# Patient Record
Sex: Female | Born: 1945 | Race: White | Hispanic: No | Smoking: Never smoker
Health system: Southern US, Community
[De-identification: ages and names within clinical notes are randomized; demographics above are authoritative.]

## PROBLEM LIST (undated history)

## (undated) DIAGNOSIS — E7800 Pure hypercholesterolemia, unspecified: Secondary | ICD-10-CM

## (undated) DIAGNOSIS — M5441 Lumbago with sciatica, right side: Secondary | ICD-10-CM

## (undated) DIAGNOSIS — J309 Allergic rhinitis, unspecified: Secondary | ICD-10-CM

## (undated) DIAGNOSIS — K219 Gastro-esophageal reflux disease without esophagitis: Secondary | ICD-10-CM

## (undated) DIAGNOSIS — Z8489 Family history of other specified conditions: Secondary | ICD-10-CM

## (undated) DIAGNOSIS — M1990 Unspecified osteoarthritis, unspecified site: Secondary | ICD-10-CM

## (undated) DIAGNOSIS — Z87442 Personal history of urinary calculi: Secondary | ICD-10-CM

## (undated) DIAGNOSIS — D649 Anemia, unspecified: Secondary | ICD-10-CM

## (undated) DIAGNOSIS — F329 Major depressive disorder, single episode, unspecified: Secondary | ICD-10-CM

## (undated) DIAGNOSIS — I2699 Other pulmonary embolism without acute cor pulmonale: Secondary | ICD-10-CM

## (undated) DIAGNOSIS — E559 Vitamin D deficiency, unspecified: Secondary | ICD-10-CM

## (undated) DIAGNOSIS — N183 Chronic kidney disease, stage 3 unspecified: Secondary | ICD-10-CM

## (undated) DIAGNOSIS — E119 Type 2 diabetes mellitus without complications: Secondary | ICD-10-CM

## (undated) DIAGNOSIS — I10 Essential (primary) hypertension: Secondary | ICD-10-CM

## (undated) DIAGNOSIS — N281 Cyst of kidney, acquired: Secondary | ICD-10-CM

## (undated) DIAGNOSIS — I509 Heart failure, unspecified: Secondary | ICD-10-CM

## (undated) DIAGNOSIS — H409 Unspecified glaucoma: Secondary | ICD-10-CM

## (undated) DIAGNOSIS — F32A Depression, unspecified: Secondary | ICD-10-CM

## (undated) HISTORY — DX: Other pulmonary embolism without acute cor pulmonale: I26.99

## (undated) HISTORY — DX: Pure hypercholesterolemia, unspecified: E78.00

## (undated) HISTORY — DX: Lumbago with sciatica, right side: M54.41

## (undated) HISTORY — PX: CHOLECYSTECTOMY OPEN: SUR202

## (undated) HISTORY — PX: COLONOSCOPY: SHX174

## (undated) HISTORY — DX: Chronic kidney disease, stage 3 unspecified: N18.30

## (undated) HISTORY — DX: Unspecified glaucoma: H40.9

## (undated) HISTORY — DX: Cyst of kidney, acquired: N28.1

## (undated) HISTORY — DX: Allergic rhinitis, unspecified: J30.9

## (undated) HISTORY — DX: Vitamin D deficiency, unspecified: E55.9

## (undated) HISTORY — PX: BREAST BIOPSY: SHX20

## (undated) HISTORY — DX: Gastro-esophageal reflux disease without esophagitis: K21.9

## (undated) MED FILL — Magnesium Sulfate IV Soln 4 GM/100ML (40 MG/ML): INTRAVENOUS | Qty: 100 | Status: AC

---

## 1999-08-09 ENCOUNTER — Encounter: Payer: Self-pay | Admitting: Family Medicine

## 1999-08-09 ENCOUNTER — Encounter: Admission: RE | Admit: 1999-08-09 | Discharge: 1999-08-09 | Payer: Self-pay | Admitting: Family Medicine

## 1999-10-11 ENCOUNTER — Encounter: Payer: Self-pay | Admitting: Family Medicine

## 1999-10-11 ENCOUNTER — Encounter: Admission: RE | Admit: 1999-10-11 | Discharge: 1999-10-11 | Payer: Self-pay | Admitting: Family Medicine

## 1999-11-27 ENCOUNTER — Ambulatory Visit (HOSPITAL_COMMUNITY): Admission: RE | Admit: 1999-11-27 | Discharge: 1999-11-27 | Payer: Self-pay | Admitting: Gastroenterology

## 1999-11-27 ENCOUNTER — Encounter (INDEPENDENT_AMBULATORY_CARE_PROVIDER_SITE_OTHER): Payer: Self-pay | Admitting: *Deleted

## 2000-09-18 ENCOUNTER — Encounter: Payer: Self-pay | Admitting: Family Medicine

## 2000-09-18 ENCOUNTER — Encounter: Admission: RE | Admit: 2000-09-18 | Discharge: 2000-09-18 | Payer: Self-pay | Admitting: Family Medicine

## 2001-09-19 ENCOUNTER — Encounter: Payer: Self-pay | Admitting: Family Medicine

## 2001-09-19 ENCOUNTER — Encounter: Admission: RE | Admit: 2001-09-19 | Discharge: 2001-09-19 | Payer: Self-pay | Admitting: Family Medicine

## 2002-12-09 ENCOUNTER — Encounter: Admission: RE | Admit: 2002-12-09 | Discharge: 2002-12-09 | Payer: Self-pay | Admitting: Family Medicine

## 2002-12-09 ENCOUNTER — Encounter: Payer: Self-pay | Admitting: Family Medicine

## 2003-12-22 ENCOUNTER — Encounter: Admission: RE | Admit: 2003-12-22 | Discharge: 2003-12-22 | Payer: Self-pay | Admitting: Family Medicine

## 2004-04-03 ENCOUNTER — Other Ambulatory Visit: Admission: RE | Admit: 2004-04-03 | Discharge: 2004-04-03 | Payer: Self-pay | Admitting: Family Medicine

## 2005-04-02 ENCOUNTER — Encounter: Admission: RE | Admit: 2005-04-02 | Discharge: 2005-04-02 | Payer: Self-pay | Admitting: Family Medicine

## 2005-04-05 ENCOUNTER — Other Ambulatory Visit: Admission: RE | Admit: 2005-04-05 | Discharge: 2005-04-05 | Payer: Self-pay | Admitting: Family Medicine

## 2006-04-03 ENCOUNTER — Encounter: Admission: RE | Admit: 2006-04-03 | Discharge: 2006-04-03 | Payer: Self-pay | Admitting: Family Medicine

## 2006-04-05 ENCOUNTER — Other Ambulatory Visit: Admission: RE | Admit: 2006-04-05 | Discharge: 2006-04-05 | Payer: Self-pay | Admitting: Family Medicine

## 2007-04-07 ENCOUNTER — Encounter: Admission: RE | Admit: 2007-04-07 | Discharge: 2007-04-07 | Payer: Self-pay | Admitting: Family Medicine

## 2007-04-09 ENCOUNTER — Other Ambulatory Visit: Admission: RE | Admit: 2007-04-09 | Discharge: 2007-04-09 | Payer: Self-pay | Admitting: Family Medicine

## 2008-04-07 ENCOUNTER — Encounter: Admission: RE | Admit: 2008-04-07 | Discharge: 2008-04-07 | Payer: Self-pay | Admitting: Family Medicine

## 2008-04-09 ENCOUNTER — Other Ambulatory Visit: Admission: RE | Admit: 2008-04-09 | Discharge: 2008-04-09 | Payer: Self-pay | Admitting: Family Medicine

## 2009-04-08 ENCOUNTER — Encounter: Admission: RE | Admit: 2009-04-08 | Discharge: 2009-04-08 | Payer: Self-pay | Admitting: Family Medicine

## 2009-05-27 ENCOUNTER — Other Ambulatory Visit: Admission: RE | Admit: 2009-05-27 | Discharge: 2009-05-27 | Payer: Self-pay | Admitting: Family Medicine

## 2010-04-10 ENCOUNTER — Encounter: Admission: RE | Admit: 2010-04-10 | Discharge: 2010-04-10 | Payer: Self-pay | Admitting: Family Medicine

## 2010-05-29 ENCOUNTER — Other Ambulatory Visit
Admission: RE | Admit: 2010-05-29 | Discharge: 2010-05-29 | Payer: Self-pay | Source: Home / Self Care | Admitting: Family Medicine

## 2010-10-27 NOTE — Procedures (Signed)
[place]  Patient:    Michaela Santana, Michaela Santana                         MRN: [id number] Proc. Date: 11/27/99 Adm. Date:  [id number] Disc. Date: [id number] Attending:  Orland Mustard CC:         Duncan Dull, M.D.                           Procedure Report  PROCEDURE:  Colonoscopy and polypectomy.  MEDICATIONS:  Fentanyl 100 mcg, Versed 10 mg IV.  INDICATION:  The patient had a barium enema that suggested a polyp in the rectum.  She has also had heme-positive stools.  There is a family history of breast cancer.  For these reasons, colonoscopy is performed.  DESCRIPTION OF PROCEDURE:  The procedure was explained to the patient and consent obtained.  With the patient in the left lateral decubitus position, the Olympus adult video colonoscope inserted and advanced under direct visualization.  The prep was excellent, and we reached the cecum without difficulty.  Just above the ileocecal valve on the posterior wall was a 0.5 cm that was removed with the snare.  We covered it with the tripod retriever.  No other polyps were seen in the ascending colon, hepatic flexure, transverse colon, splenic flexure, descending, and sigmoid colon.  In the rectum, one area that could have been a polyp was 2-4 mm in size and was probably a sessile polyp.  It was cauterized with the hot biopsy forceps, placed in a separate jar.  No other lesions were seen.  The scope withdrawn.  The patient tolerated the procedure well.  She was maintained on low-flow oxygen and pulse oximetry throughout the procedure with no obvious problems.  ASSESSMENT:  Polyps removed from the rectum and ascending colon.  PLAN:  Will check pathology and depending on the results, will make further recommendations. DD:  11/27/99 TD:  11/30/99 Job: [id number]

## 2011-03-19 ENCOUNTER — Other Ambulatory Visit: Payer: Self-pay | Admitting: Family Medicine

## 2011-03-19 DIAGNOSIS — Z1231 Encounter for screening mammogram for malignant neoplasm of breast: Secondary | ICD-10-CM

## 2011-04-17 ENCOUNTER — Ambulatory Visit
Admission: RE | Admit: 2011-04-17 | Discharge: 2011-04-17 | Disposition: A | Discharge status: Home / Self Care | Payer: Managed Care, Other (non HMO) | Source: Ambulatory Visit | Attending: Family Medicine | Admitting: Family Medicine

## 2011-04-17 DIAGNOSIS — Z1231 Encounter for screening mammogram for malignant neoplasm of breast: Secondary | ICD-10-CM

## 2012-01-16 DIAGNOSIS — M25579 Pain in unspecified ankle and joints of unspecified foot: Secondary | ICD-10-CM | POA: Diagnosis not present

## 2012-01-16 DIAGNOSIS — M659 Synovitis and tenosynovitis, unspecified: Secondary | ICD-10-CM | POA: Diagnosis not present

## 2012-01-16 DIAGNOSIS — M19079 Primary osteoarthritis, unspecified ankle and foot: Secondary | ICD-10-CM | POA: Diagnosis not present

## 2012-01-28 DIAGNOSIS — H40019 Open angle with borderline findings, low risk, unspecified eye: Secondary | ICD-10-CM | POA: Diagnosis not present

## 2012-01-28 DIAGNOSIS — E119 Type 2 diabetes mellitus without complications: Secondary | ICD-10-CM | POA: Diagnosis not present

## 2012-03-21 DIAGNOSIS — Z23 Encounter for immunization: Secondary | ICD-10-CM | POA: Diagnosis not present

## 2012-03-31 ENCOUNTER — Other Ambulatory Visit: Payer: Self-pay | Admitting: Family Medicine

## 2012-03-31 DIAGNOSIS — Z1231 Encounter for screening mammogram for malignant neoplasm of breast: Secondary | ICD-10-CM

## 2012-05-01 ENCOUNTER — Ambulatory Visit
Admission: RE | Admit: 2012-05-01 | Discharge: 2012-05-01 | Disposition: A | Discharge status: Home / Self Care | Payer: Medicare Other | Source: Ambulatory Visit | Attending: Family Medicine | Admitting: Family Medicine

## 2012-05-01 DIAGNOSIS — Z1231 Encounter for screening mammogram for malignant neoplasm of breast: Secondary | ICD-10-CM

## 2012-05-20 DIAGNOSIS — R059 Cough, unspecified: Secondary | ICD-10-CM | POA: Diagnosis not present

## 2012-05-20 DIAGNOSIS — R05 Cough: Secondary | ICD-10-CM | POA: Diagnosis not present

## 2012-05-20 DIAGNOSIS — S20219A Contusion of unspecified front wall of thorax, initial encounter: Secondary | ICD-10-CM | POA: Diagnosis not present

## 2012-05-27 DIAGNOSIS — B029 Zoster without complications: Secondary | ICD-10-CM | POA: Diagnosis not present

## 2012-06-16 DIAGNOSIS — F329 Major depressive disorder, single episode, unspecified: Secondary | ICD-10-CM | POA: Diagnosis not present

## 2012-06-16 DIAGNOSIS — M79609 Pain in unspecified limb: Secondary | ICD-10-CM | POA: Diagnosis not present

## 2012-06-16 DIAGNOSIS — E559 Vitamin D deficiency, unspecified: Secondary | ICD-10-CM | POA: Diagnosis not present

## 2012-06-16 DIAGNOSIS — E119 Type 2 diabetes mellitus without complications: Secondary | ICD-10-CM | POA: Diagnosis not present

## 2012-06-16 DIAGNOSIS — Z0000 Encounter for general adult medical examination without abnormal findings: Secondary | ICD-10-CM | POA: Diagnosis not present

## 2012-06-16 DIAGNOSIS — I10 Essential (primary) hypertension: Secondary | ICD-10-CM | POA: Diagnosis not present

## 2012-06-16 DIAGNOSIS — E7800 Pure hypercholesterolemia, unspecified: Secondary | ICD-10-CM | POA: Diagnosis not present

## 2012-06-16 DIAGNOSIS — Z23 Encounter for immunization: Secondary | ICD-10-CM | POA: Diagnosis not present

## 2012-07-16 DIAGNOSIS — E876 Hypokalemia: Secondary | ICD-10-CM | POA: Diagnosis not present

## 2012-12-15 DIAGNOSIS — E119 Type 2 diabetes mellitus without complications: Secondary | ICD-10-CM | POA: Diagnosis not present

## 2012-12-15 DIAGNOSIS — E7800 Pure hypercholesterolemia, unspecified: Secondary | ICD-10-CM | POA: Diagnosis not present

## 2012-12-15 DIAGNOSIS — I10 Essential (primary) hypertension: Secondary | ICD-10-CM | POA: Diagnosis not present

## 2012-12-15 DIAGNOSIS — F329 Major depressive disorder, single episode, unspecified: Secondary | ICD-10-CM | POA: Diagnosis not present

## 2012-12-15 DIAGNOSIS — K219 Gastro-esophageal reflux disease without esophagitis: Secondary | ICD-10-CM | POA: Diagnosis not present

## 2012-12-15 DIAGNOSIS — E559 Vitamin D deficiency, unspecified: Secondary | ICD-10-CM | POA: Diagnosis not present

## 2013-01-12 DIAGNOSIS — H2510 Age-related nuclear cataract, unspecified eye: Secondary | ICD-10-CM | POA: Diagnosis not present

## 2013-01-12 DIAGNOSIS — H5210 Myopia, unspecified eye: Secondary | ICD-10-CM | POA: Diagnosis not present

## 2013-01-12 DIAGNOSIS — E119 Type 2 diabetes mellitus without complications: Secondary | ICD-10-CM | POA: Diagnosis not present

## 2013-03-31 ENCOUNTER — Other Ambulatory Visit: Payer: Self-pay

## 2013-03-31 DIAGNOSIS — Z1231 Encounter for screening mammogram for malignant neoplasm of breast: Secondary | ICD-10-CM

## 2013-05-04 ENCOUNTER — Ambulatory Visit
Admission: RE | Admit: 2013-05-04 | Discharge: 2013-05-04 | Disposition: A | Discharge status: Home / Self Care | Payer: Medicare Other | Source: Ambulatory Visit

## 2013-05-04 DIAGNOSIS — Z1231 Encounter for screening mammogram for malignant neoplasm of breast: Secondary | ICD-10-CM

## 2013-06-22 DIAGNOSIS — E7800 Pure hypercholesterolemia, unspecified: Secondary | ICD-10-CM | POA: Diagnosis not present

## 2013-06-22 DIAGNOSIS — E559 Vitamin D deficiency, unspecified: Secondary | ICD-10-CM | POA: Diagnosis not present

## 2013-06-22 DIAGNOSIS — M949 Disorder of cartilage, unspecified: Secondary | ICD-10-CM | POA: Diagnosis not present

## 2013-06-22 DIAGNOSIS — I10 Essential (primary) hypertension: Secondary | ICD-10-CM | POA: Diagnosis not present

## 2013-06-22 DIAGNOSIS — E119 Type 2 diabetes mellitus without complications: Secondary | ICD-10-CM | POA: Diagnosis not present

## 2013-06-22 DIAGNOSIS — Z0000 Encounter for general adult medical examination without abnormal findings: Secondary | ICD-10-CM | POA: Diagnosis not present

## 2013-06-22 DIAGNOSIS — F3289 Other specified depressive episodes: Secondary | ICD-10-CM | POA: Diagnosis not present

## 2013-06-22 DIAGNOSIS — M899 Disorder of bone, unspecified: Secondary | ICD-10-CM | POA: Diagnosis not present

## 2013-06-22 DIAGNOSIS — F329 Major depressive disorder, single episode, unspecified: Secondary | ICD-10-CM | POA: Diagnosis not present

## 2013-07-23 DIAGNOSIS — M899 Disorder of bone, unspecified: Secondary | ICD-10-CM | POA: Diagnosis not present

## 2013-07-23 DIAGNOSIS — M949 Disorder of cartilage, unspecified: Secondary | ICD-10-CM | POA: Diagnosis not present

## 2013-08-26 DIAGNOSIS — N390 Urinary tract infection, site not specified: Secondary | ICD-10-CM | POA: Diagnosis not present

## 2013-12-23 DIAGNOSIS — M79609 Pain in unspecified limb: Secondary | ICD-10-CM | POA: Diagnosis not present

## 2013-12-24 ENCOUNTER — Ambulatory Visit
Admission: RE | Admit: 2013-12-24 | Discharge: 2013-12-24 | Disposition: A | Discharge status: Home / Self Care | Payer: Medicare Other | Source: Ambulatory Visit | Attending: Family Medicine | Admitting: Family Medicine

## 2013-12-24 ENCOUNTER — Other Ambulatory Visit: Payer: Self-pay | Admitting: Family Medicine

## 2013-12-24 ENCOUNTER — Encounter (INDEPENDENT_AMBULATORY_CARE_PROVIDER_SITE_OTHER): Payer: Self-pay

## 2013-12-24 DIAGNOSIS — R52 Pain, unspecified: Secondary | ICD-10-CM

## 2013-12-24 DIAGNOSIS — M79609 Pain in unspecified limb: Secondary | ICD-10-CM | POA: Diagnosis not present

## 2013-12-24 DIAGNOSIS — M19079 Primary osteoarthritis, unspecified ankle and foot: Secondary | ICD-10-CM | POA: Diagnosis not present

## 2013-12-24 IMAGING — CR DG ANKLE COMPLETE 3+V*L*
3 series · 3 of 3 positions shown · non-contrast
Comparison: None.

CLINICAL DATA: Pain and swelling, no trauma

EXAM:
LEFT ANKLE COMPLETE - 3+ VIEW

[t ankle joint lat left]
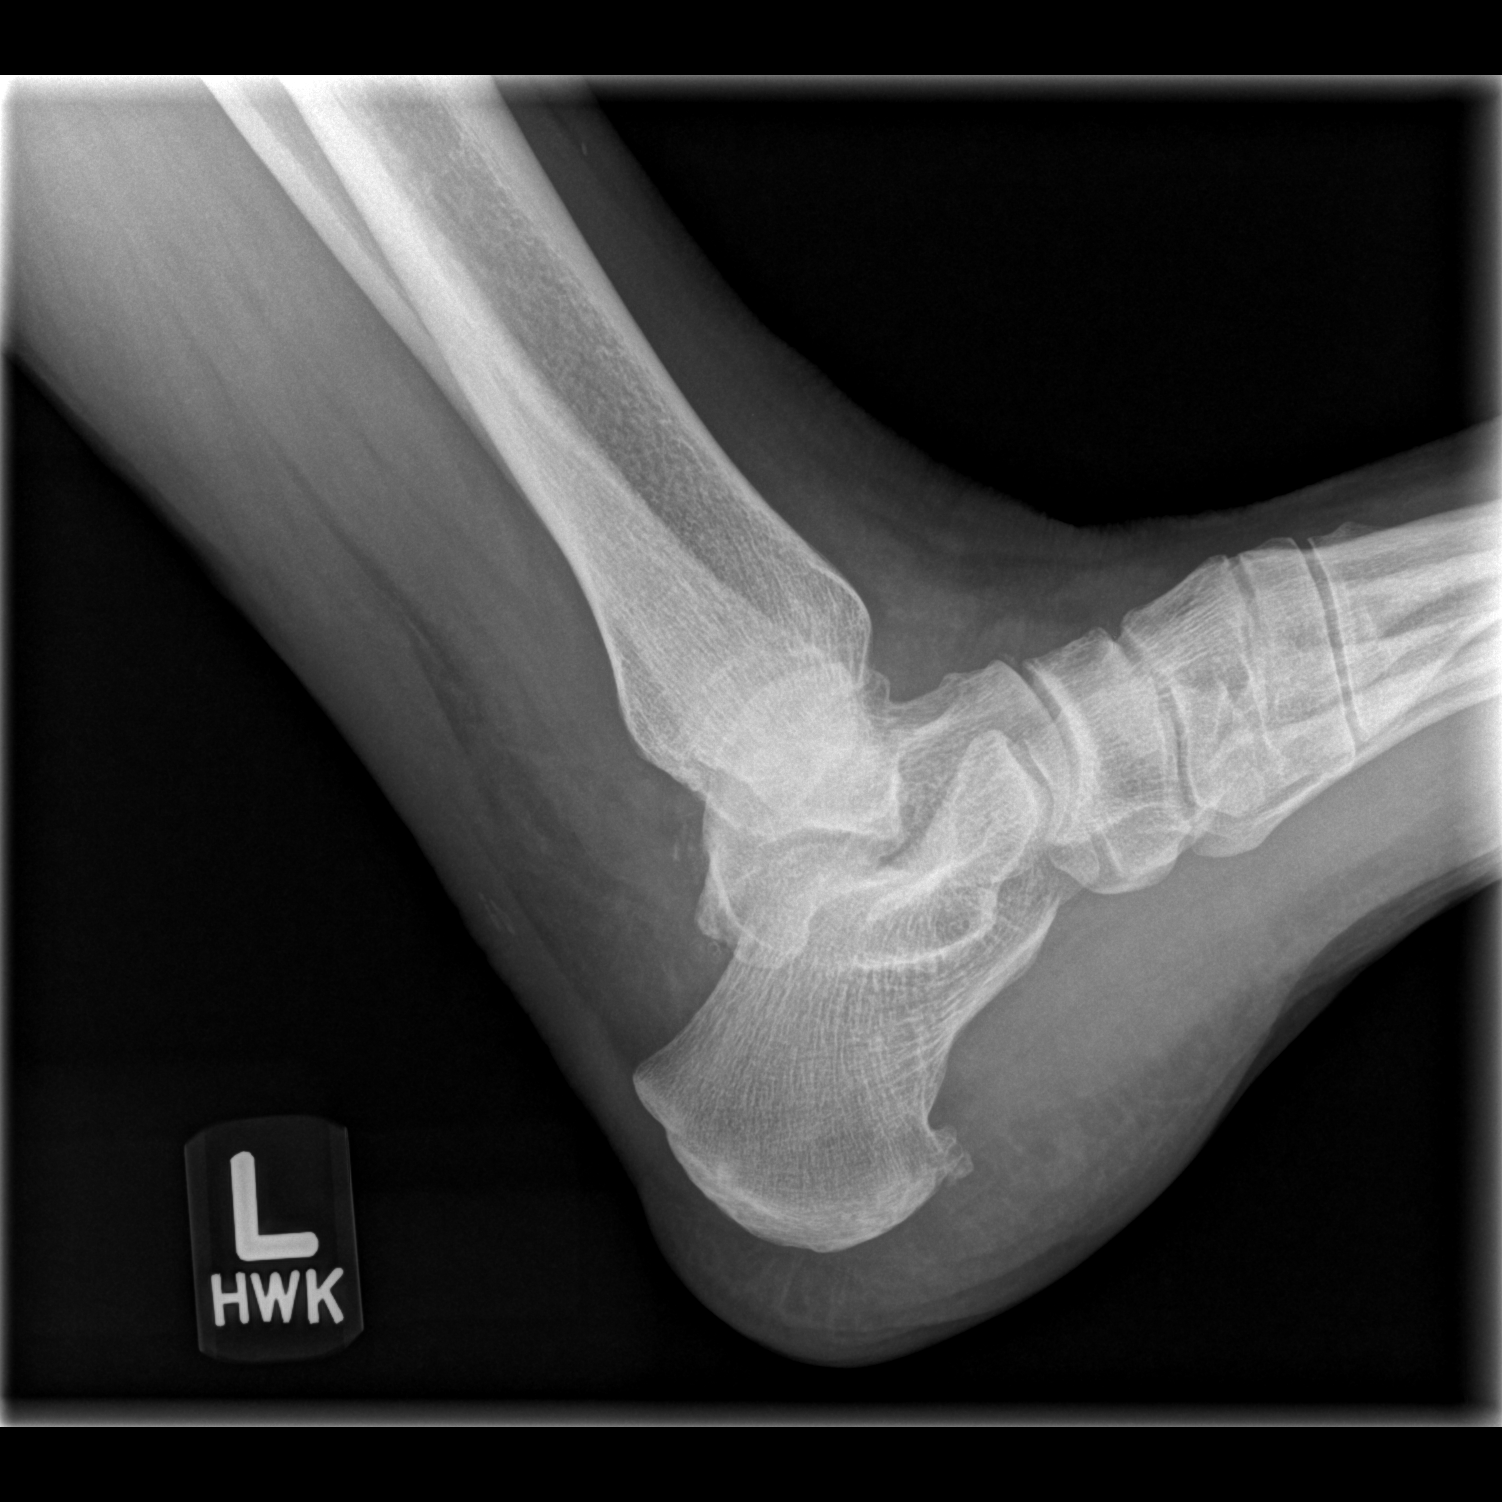

[t ankle joint ap left]
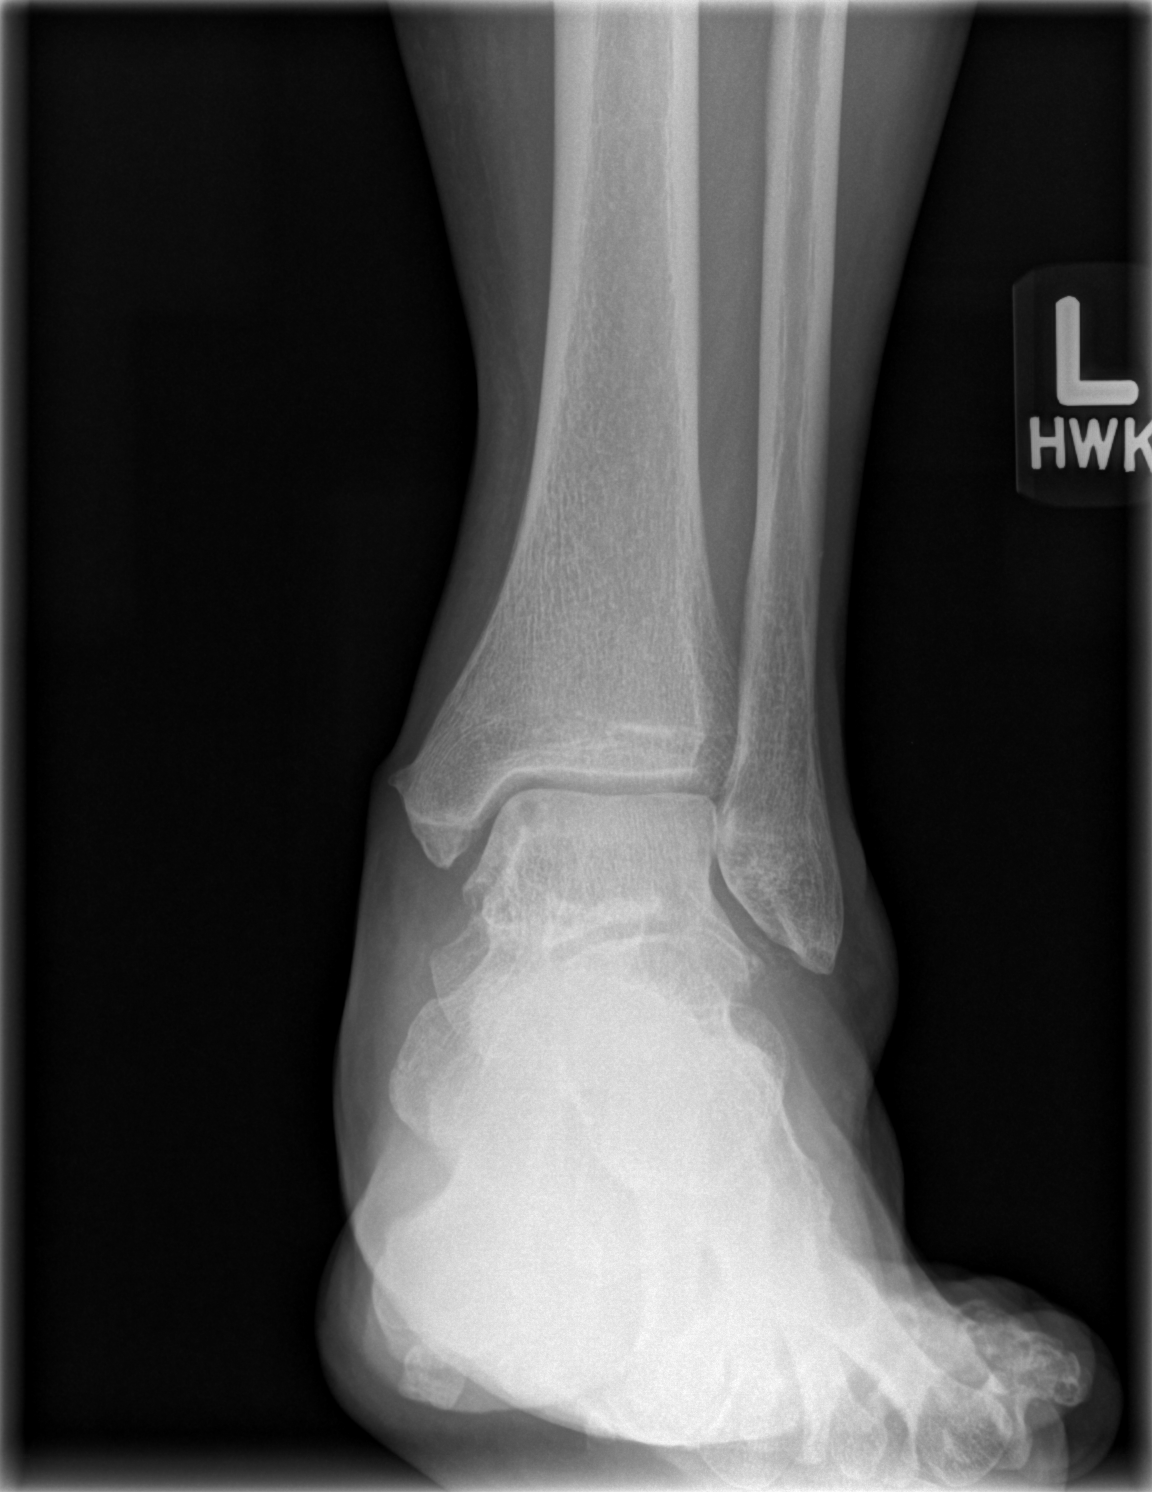

[t ankle joint oblique left]
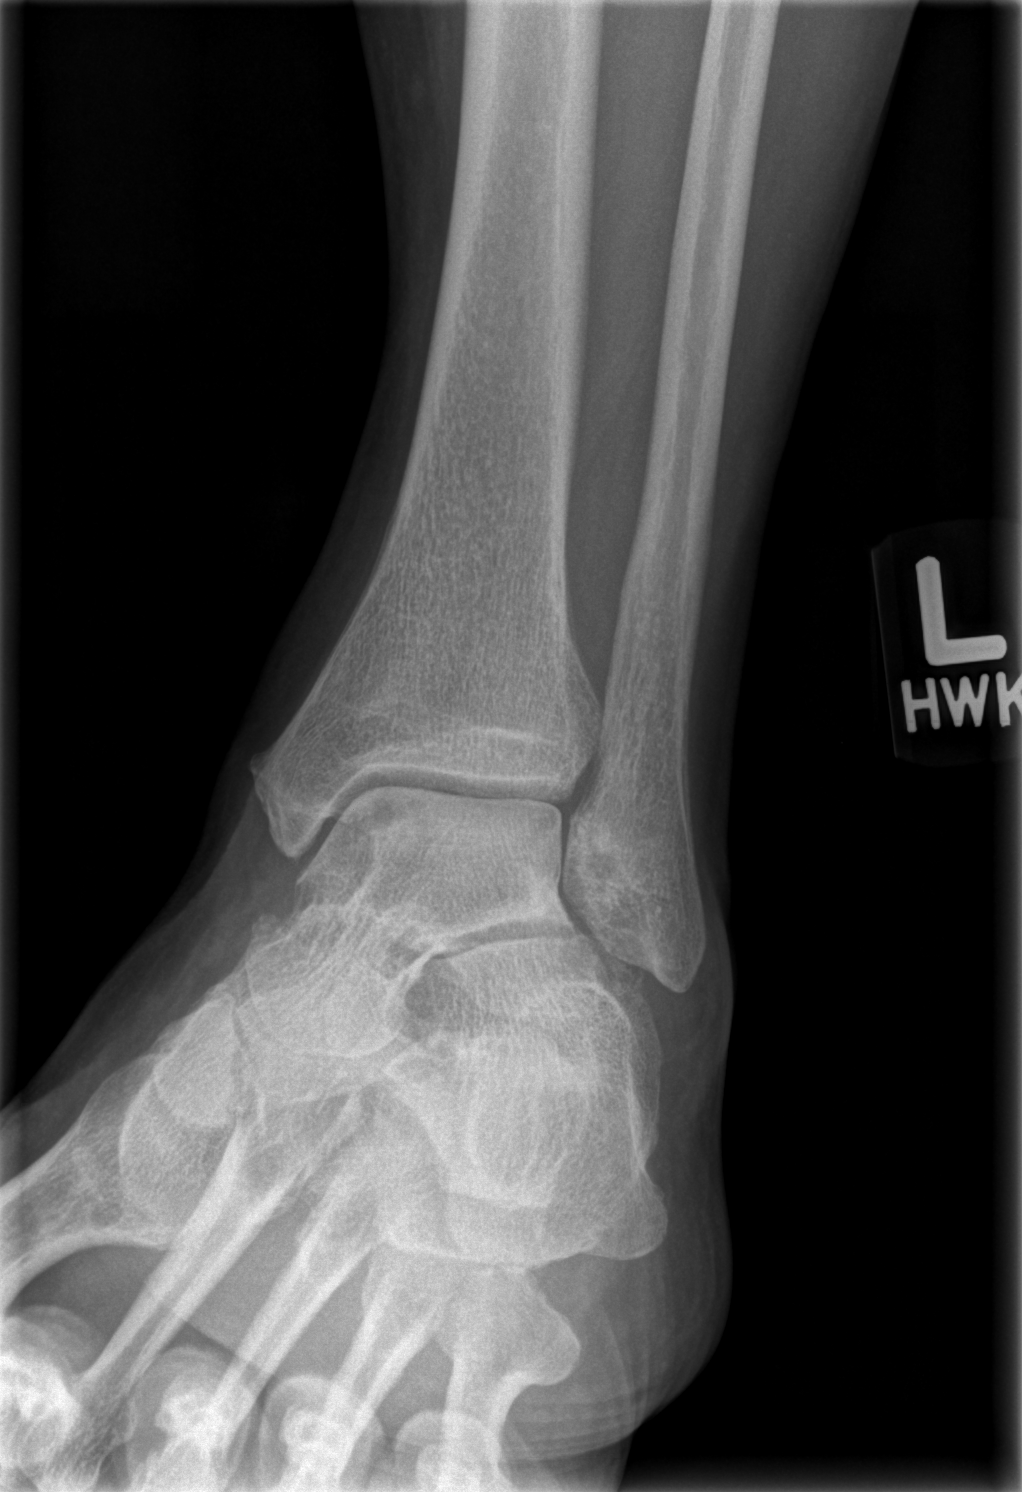

[3 of 3 positions shown; findings below may reference images not displayed]

FINDINGS: No acute fracture is seen. The ankle joint appears normal. Alignment
is normal. A small plantar calcaneal degenerative spur is present.
There is faint soft tissue calcification just distal to the left
fibular tip on the lateral talus -calcaneal region of questionable
significance. This could be due to prior trauma.
IMPRESSION: No acute abnormality. Plantar calcaneal degenerative spur. Faint
calcification in the soft tissues laterally of questionable
significance. Correlate clinically.

## 2013-12-24 IMAGING — CR DG FOOT COMPLETE 3+V*L*
3 series · 3 of 3 positions shown · non-contrast
Comparison: None.

CLINICAL DATA: Left foot pain.

EXAM:
LEFT FOOT - COMPLETE 3+ VIEW

[t foot ap left]
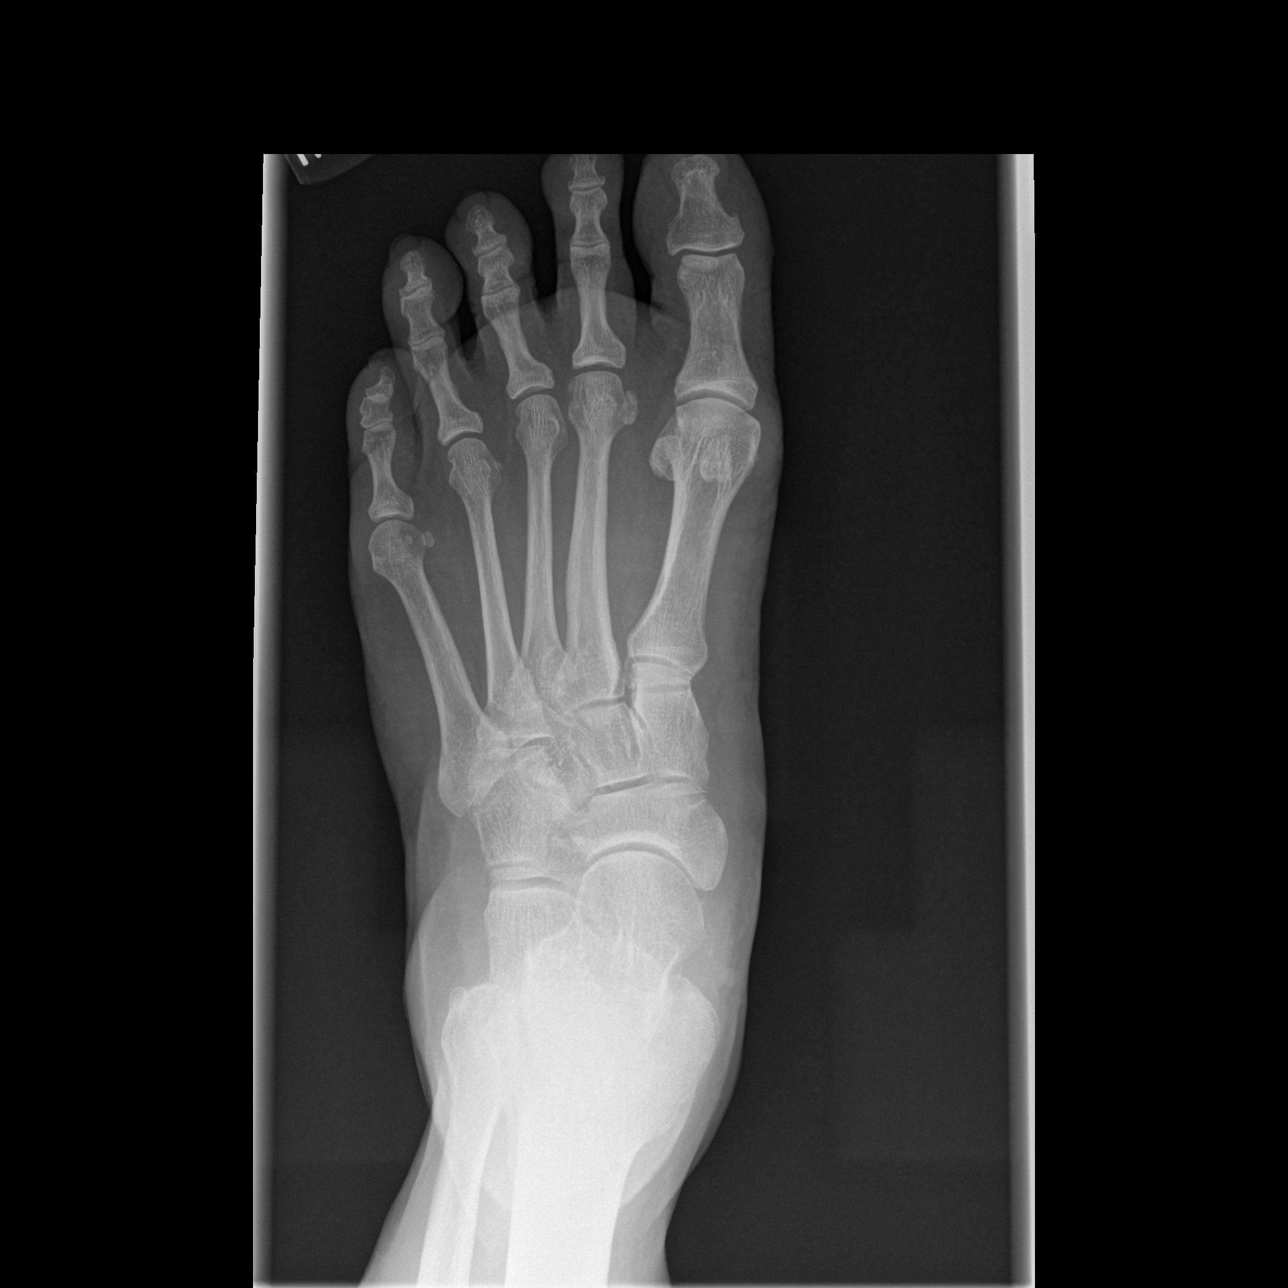

[t foot oblique left]
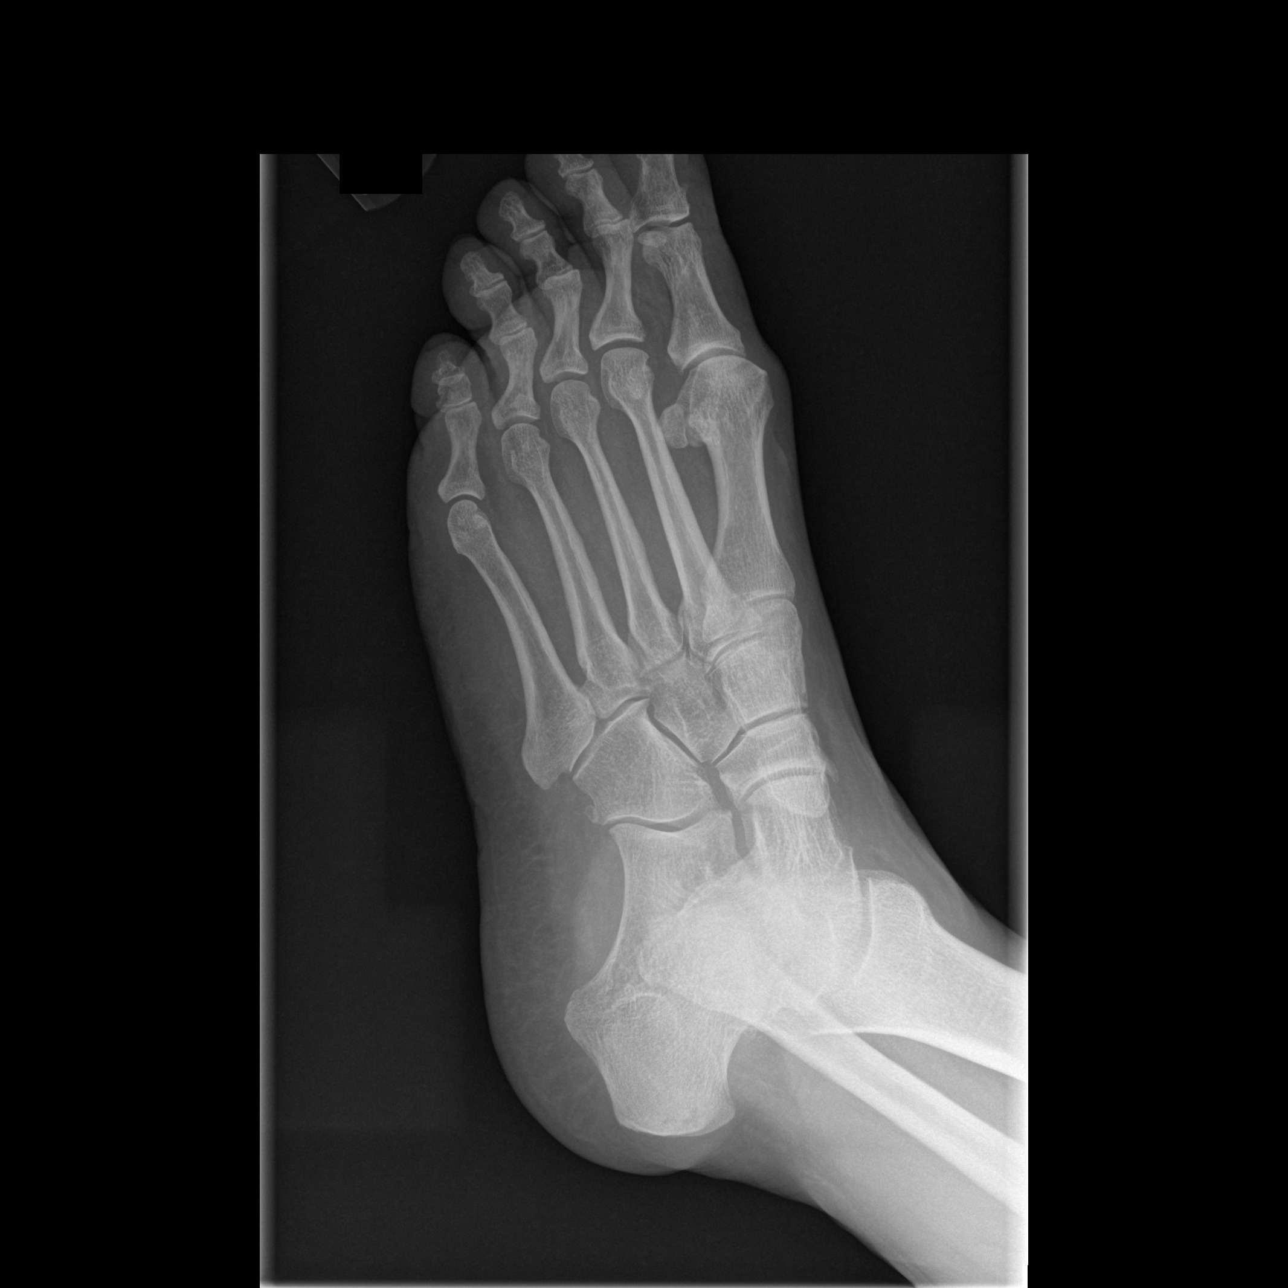

[t foot lat left]
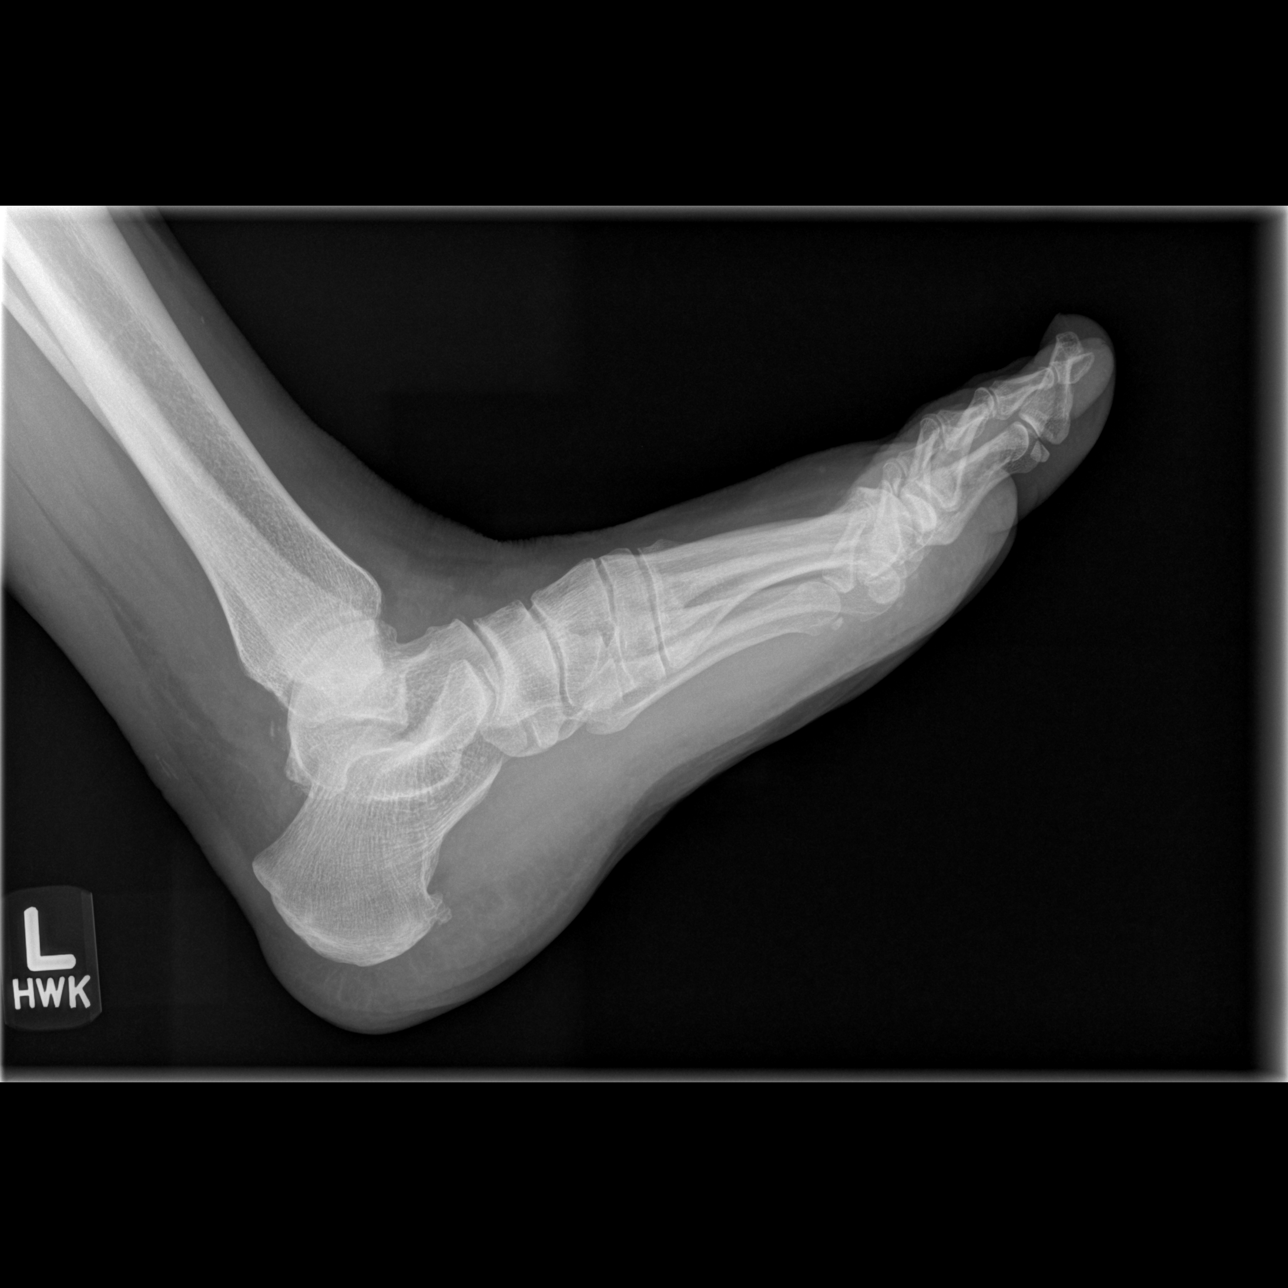

[3 of 3 positions shown; findings below may reference images not displayed]

FINDINGS: There is no evidence of fracture or dislocation. There is no
evidence of arthropathy. Minimal spurring of posterior calcaneus is
noted. Soft tissues are unremarkable.
IMPRESSION: No acute abnormality seen in the left foot.

## 2014-01-15 DIAGNOSIS — F3289 Other specified depressive episodes: Secondary | ICD-10-CM | POA: Diagnosis not present

## 2014-01-15 DIAGNOSIS — E7800 Pure hypercholesterolemia, unspecified: Secondary | ICD-10-CM | POA: Diagnosis not present

## 2014-01-15 DIAGNOSIS — E119 Type 2 diabetes mellitus without complications: Secondary | ICD-10-CM | POA: Diagnosis not present

## 2014-01-15 DIAGNOSIS — E559 Vitamin D deficiency, unspecified: Secondary | ICD-10-CM | POA: Diagnosis not present

## 2014-01-15 DIAGNOSIS — F329 Major depressive disorder, single episode, unspecified: Secondary | ICD-10-CM | POA: Diagnosis not present

## 2014-01-15 DIAGNOSIS — I10 Essential (primary) hypertension: Secondary | ICD-10-CM | POA: Diagnosis not present

## 2014-01-29 DIAGNOSIS — H2510 Age-related nuclear cataract, unspecified eye: Secondary | ICD-10-CM | POA: Diagnosis not present

## 2014-01-29 DIAGNOSIS — H04129 Dry eye syndrome of unspecified lacrimal gland: Secondary | ICD-10-CM | POA: Diagnosis not present

## 2014-01-29 DIAGNOSIS — H40019 Open angle with borderline findings, low risk, unspecified eye: Secondary | ICD-10-CM | POA: Diagnosis not present

## 2014-01-29 DIAGNOSIS — H3589 Other specified retinal disorders: Secondary | ICD-10-CM | POA: Diagnosis not present

## 2014-01-29 DIAGNOSIS — E119 Type 2 diabetes mellitus without complications: Secondary | ICD-10-CM | POA: Diagnosis not present

## 2014-02-05 DIAGNOSIS — H3589 Other specified retinal disorders: Secondary | ICD-10-CM | POA: Diagnosis not present

## 2014-02-05 DIAGNOSIS — H40019 Open angle with borderline findings, low risk, unspecified eye: Secondary | ICD-10-CM | POA: Diagnosis not present

## 2014-03-19 DIAGNOSIS — Z23 Encounter for immunization: Secondary | ICD-10-CM | POA: Diagnosis not present

## 2014-03-29 ENCOUNTER — Other Ambulatory Visit: Payer: Self-pay

## 2014-03-29 DIAGNOSIS — Z1231 Encounter for screening mammogram for malignant neoplasm of breast: Secondary | ICD-10-CM

## 2014-05-14 ENCOUNTER — Ambulatory Visit: Payer: Medicare Other

## 2014-05-14 ENCOUNTER — Ambulatory Visit
Admission: RE | Admit: 2014-05-14 | Discharge: 2014-05-14 | Disposition: A | Discharge status: Home / Self Care | Payer: Medicare Other | Source: Ambulatory Visit

## 2014-05-14 DIAGNOSIS — H40013 Open angle with borderline findings, low risk, bilateral: Secondary | ICD-10-CM | POA: Diagnosis not present

## 2014-05-14 DIAGNOSIS — H02839 Dermatochalasis of unspecified eye, unspecified eyelid: Secondary | ICD-10-CM | POA: Diagnosis not present

## 2014-05-14 DIAGNOSIS — Z1231 Encounter for screening mammogram for malignant neoplasm of breast: Secondary | ICD-10-CM | POA: Diagnosis not present

## 2014-05-28 DIAGNOSIS — R300 Dysuria: Secondary | ICD-10-CM | POA: Diagnosis not present

## 2014-05-28 DIAGNOSIS — N390 Urinary tract infection, site not specified: Secondary | ICD-10-CM | POA: Diagnosis not present

## 2014-05-28 DIAGNOSIS — E119 Type 2 diabetes mellitus without complications: Secondary | ICD-10-CM | POA: Diagnosis not present

## 2014-06-28 ENCOUNTER — Other Ambulatory Visit (HOSPITAL_COMMUNITY)
Admission: RE | Admit: 2014-06-28 | Discharge: 2014-06-28 | Disposition: A | Discharge status: Home / Self Care | Payer: Medicare Other | Source: Ambulatory Visit | Attending: Family Medicine | Admitting: Family Medicine

## 2014-06-28 ENCOUNTER — Other Ambulatory Visit: Payer: Self-pay | Admitting: Family Medicine

## 2014-06-28 DIAGNOSIS — Z0000 Encounter for general adult medical examination without abnormal findings: Secondary | ICD-10-CM | POA: Diagnosis not present

## 2014-06-28 DIAGNOSIS — E559 Vitamin D deficiency, unspecified: Secondary | ICD-10-CM | POA: Diagnosis not present

## 2014-06-28 DIAGNOSIS — I10 Essential (primary) hypertension: Secondary | ICD-10-CM | POA: Diagnosis not present

## 2014-06-28 DIAGNOSIS — Z124 Encounter for screening for malignant neoplasm of cervix: Secondary | ICD-10-CM | POA: Insufficient documentation

## 2014-06-28 DIAGNOSIS — E669 Obesity, unspecified: Secondary | ICD-10-CM | POA: Diagnosis not present

## 2014-06-28 DIAGNOSIS — M1990 Unspecified osteoarthritis, unspecified site: Secondary | ICD-10-CM | POA: Diagnosis not present

## 2014-06-28 DIAGNOSIS — F329 Major depressive disorder, single episode, unspecified: Secondary | ICD-10-CM | POA: Diagnosis not present

## 2014-06-28 DIAGNOSIS — Z23 Encounter for immunization: Secondary | ICD-10-CM | POA: Diagnosis not present

## 2014-06-28 DIAGNOSIS — Z1389 Encounter for screening for other disorder: Secondary | ICD-10-CM | POA: Diagnosis not present

## 2014-06-28 DIAGNOSIS — E119 Type 2 diabetes mellitus without complications: Secondary | ICD-10-CM | POA: Diagnosis not present

## 2014-06-28 DIAGNOSIS — E780 Pure hypercholesterolemia: Secondary | ICD-10-CM | POA: Diagnosis not present

## 2014-06-29 LAB — CYTOLOGY - PAP

## 2014-07-13 DIAGNOSIS — S63509A Unspecified sprain of unspecified wrist, initial encounter: Secondary | ICD-10-CM | POA: Diagnosis not present

## 2014-07-16 ENCOUNTER — Ambulatory Visit
Admission: RE | Admit: 2014-07-16 | Discharge: 2014-07-16 | Disposition: A | Discharge status: Home / Self Care | Payer: Medicare Other | Source: Ambulatory Visit | Attending: Family Medicine | Admitting: Family Medicine

## 2014-07-16 ENCOUNTER — Other Ambulatory Visit: Payer: Self-pay | Admitting: Family Medicine

## 2014-07-16 DIAGNOSIS — S63502A Unspecified sprain of left wrist, initial encounter: Secondary | ICD-10-CM

## 2014-07-16 DIAGNOSIS — M25532 Pain in left wrist: Secondary | ICD-10-CM | POA: Diagnosis not present

## 2014-07-16 DIAGNOSIS — S6992XA Unspecified injury of left wrist, hand and finger(s), initial encounter: Secondary | ICD-10-CM | POA: Diagnosis not present

## 2014-07-16 IMAGING — CR DG WRIST COMPLETE 3+V*L*
4 series · 4 of 4 positions shown · non-contrast
Comparison: None.

CLINICAL DATA: One month history of left it left wrist pain, injury
are popping up grandson, pain centered in the distal radius.

EXAM:
LEFT WRIST - COMPLETE 3+ VIEW

[view not recorded (1 of 4)]
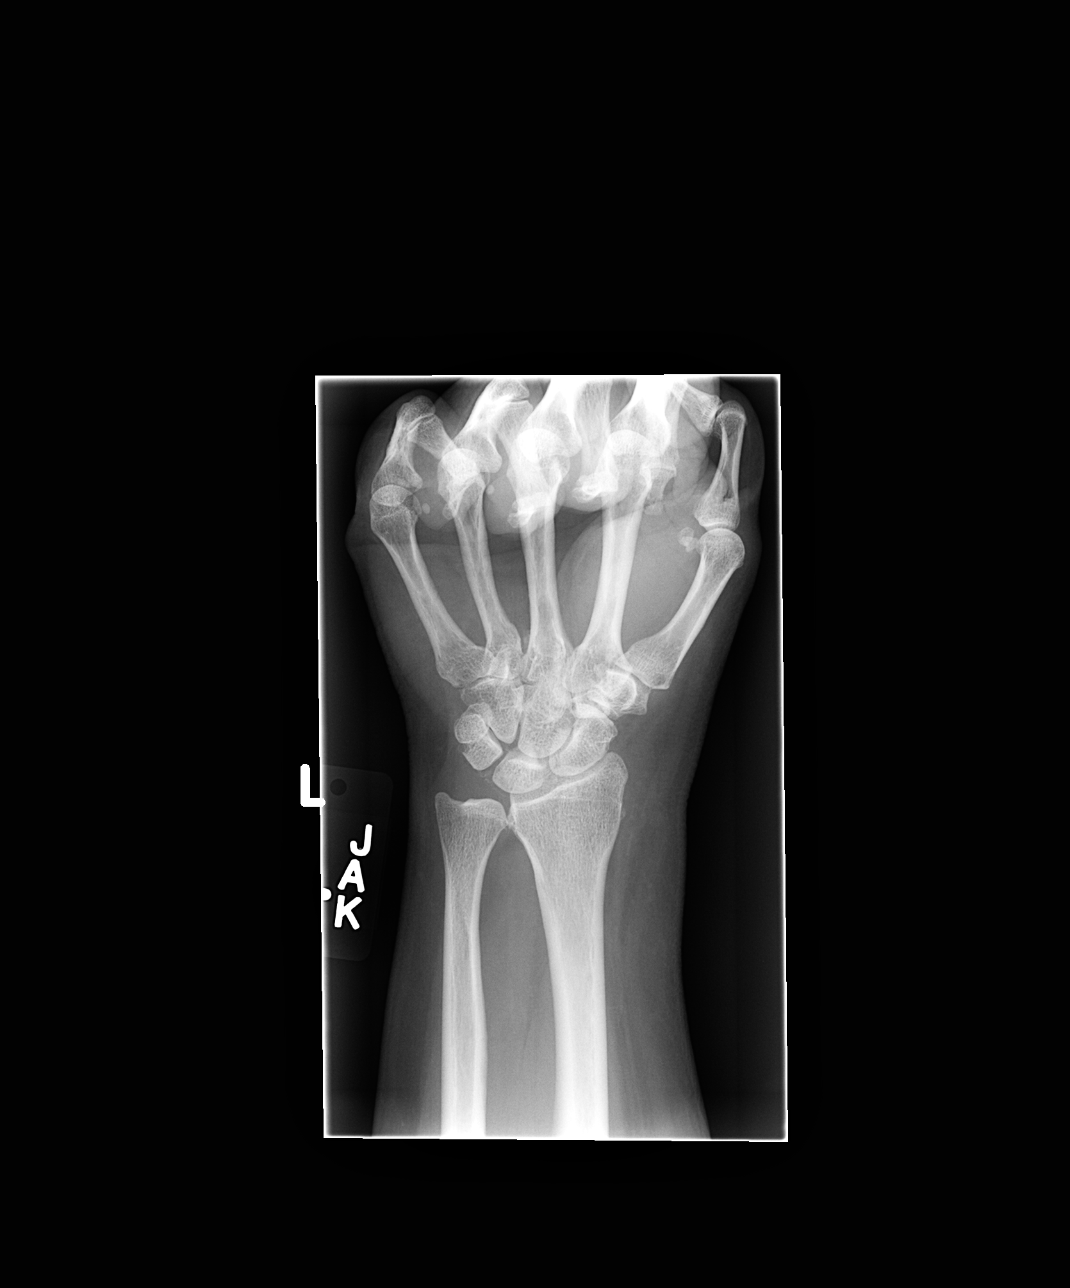

[view not recorded (2 of 4)]
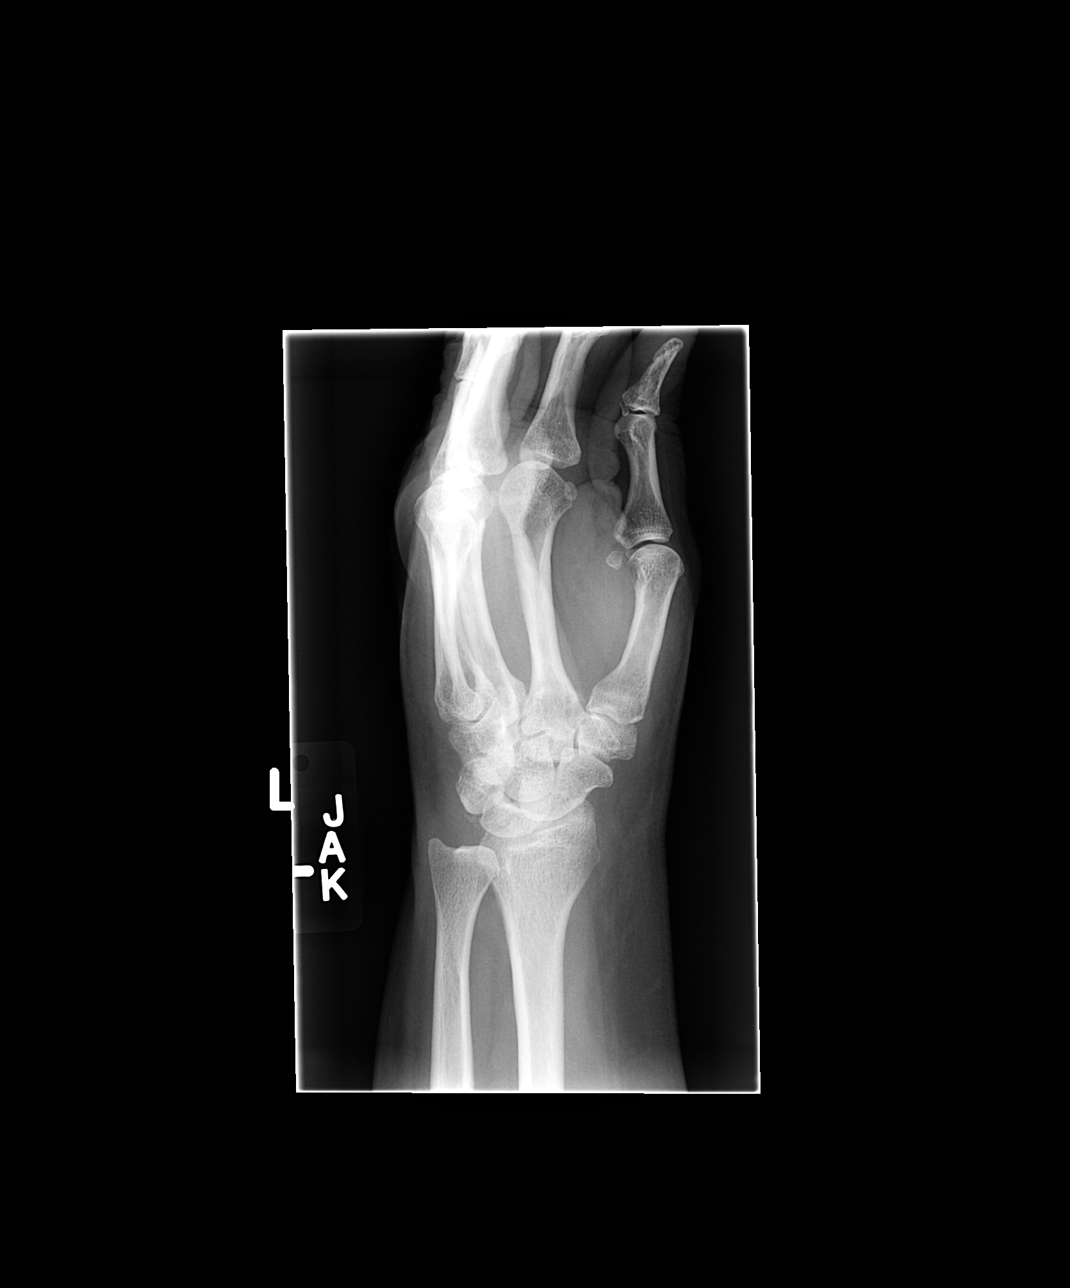

[view not recorded (3 of 4)]
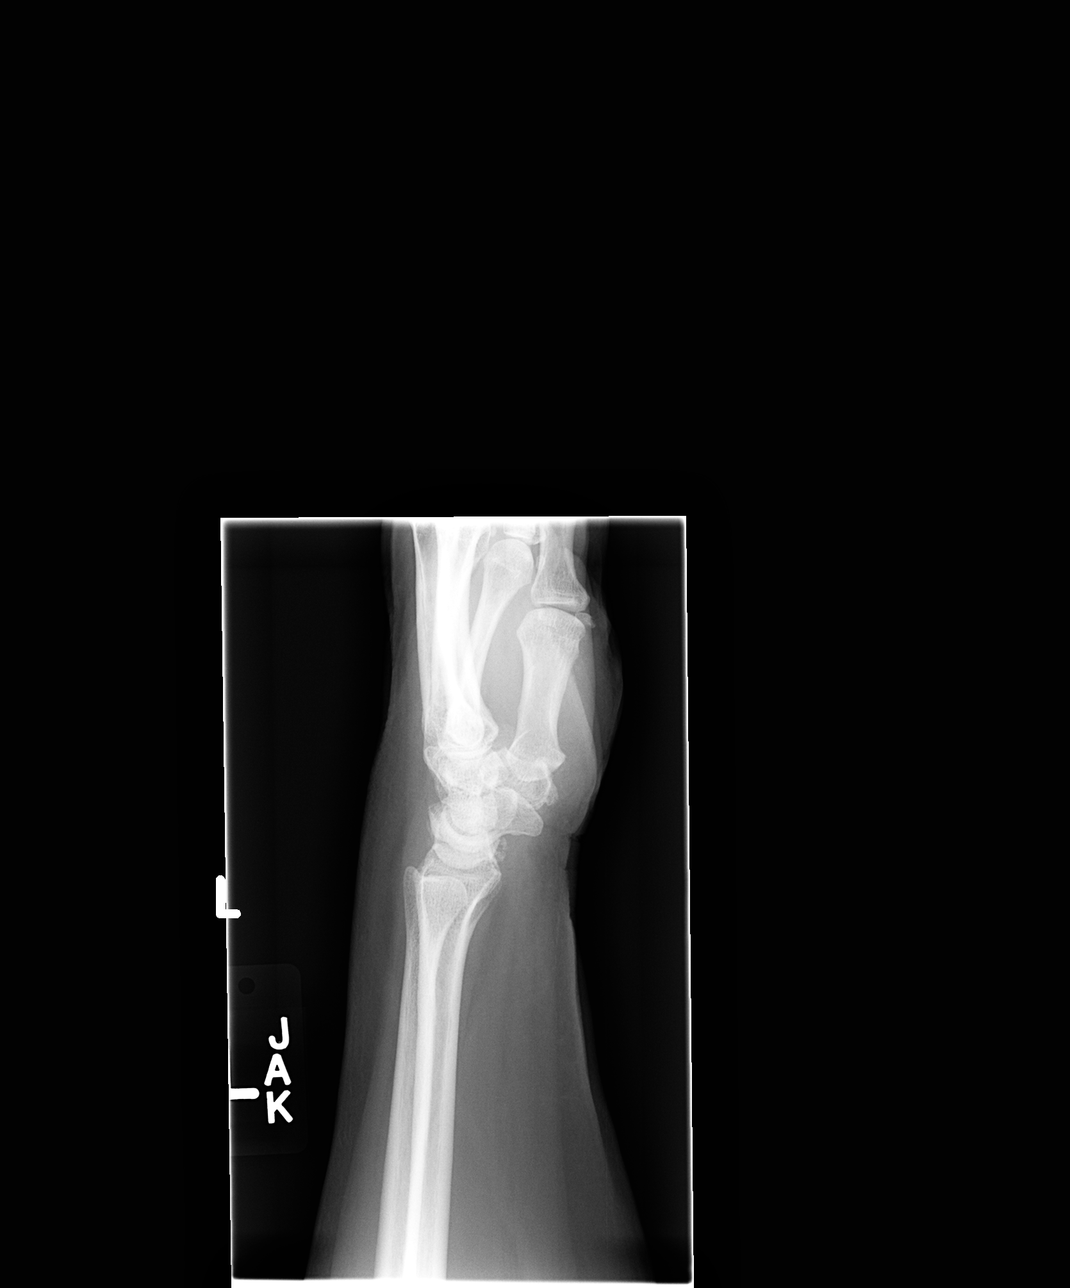

[view not recorded (4 of 4)]
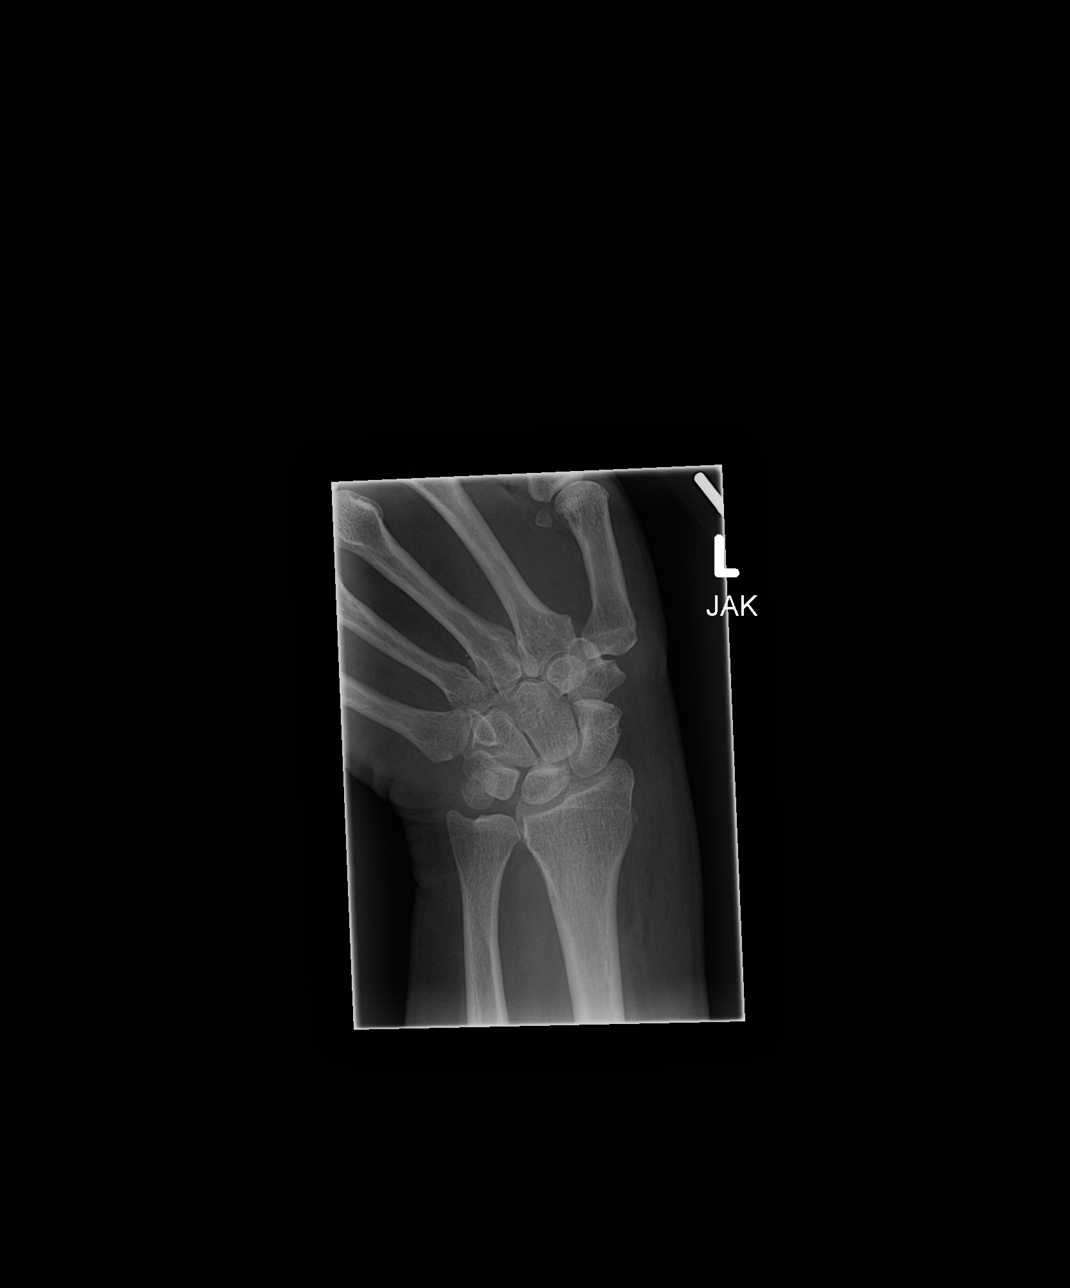

[4 of 4 positions shown; findings below may reference images not displayed]

FINDINGS: The bones of the left wrist are adequately mineralized. The distal
radius and ulna are intact. There is mild narrowing of the
radiocarpal joint. There is calcification in the anterior aspect of
the triangular fibrocartilage. There is mild degenerative change of
the first carpometacarpal joint. The overlying soft tissues exhibit
mild swelling diffusely.
IMPRESSION: There are mild degenerative changes of the left wrist. There is no
acute fracture nor dislocation.

## 2014-10-27 DIAGNOSIS — M25532 Pain in left wrist: Secondary | ICD-10-CM | POA: Diagnosis not present

## 2014-10-27 DIAGNOSIS — M654 Radial styloid tenosynovitis [de Quervain]: Secondary | ICD-10-CM | POA: Diagnosis not present

## 2014-11-24 DIAGNOSIS — M654 Radial styloid tenosynovitis [de Quervain]: Secondary | ICD-10-CM | POA: Diagnosis not present

## 2014-11-24 DIAGNOSIS — M25532 Pain in left wrist: Secondary | ICD-10-CM | POA: Diagnosis not present

## 2014-12-30 DIAGNOSIS — I10 Essential (primary) hypertension: Secondary | ICD-10-CM | POA: Diagnosis not present

## 2014-12-30 DIAGNOSIS — F324 Major depressive disorder, single episode, in partial remission: Secondary | ICD-10-CM | POA: Diagnosis not present

## 2014-12-30 DIAGNOSIS — K219 Gastro-esophageal reflux disease without esophagitis: Secondary | ICD-10-CM | POA: Diagnosis not present

## 2014-12-30 DIAGNOSIS — E119 Type 2 diabetes mellitus without complications: Secondary | ICD-10-CM | POA: Diagnosis not present

## 2014-12-30 DIAGNOSIS — E780 Pure hypercholesterolemia: Secondary | ICD-10-CM | POA: Diagnosis not present

## 2014-12-30 DIAGNOSIS — E559 Vitamin D deficiency, unspecified: Secondary | ICD-10-CM | POA: Diagnosis not present

## 2015-02-04 DIAGNOSIS — H2513 Age-related nuclear cataract, bilateral: Secondary | ICD-10-CM | POA: Diagnosis not present

## 2015-02-04 DIAGNOSIS — E876 Hypokalemia: Secondary | ICD-10-CM | POA: Diagnosis not present

## 2015-02-04 DIAGNOSIS — E119 Type 2 diabetes mellitus without complications: Secondary | ICD-10-CM | POA: Diagnosis not present

## 2015-02-04 DIAGNOSIS — H40013 Open angle with borderline findings, low risk, bilateral: Secondary | ICD-10-CM | POA: Diagnosis not present

## 2015-02-25 DIAGNOSIS — E876 Hypokalemia: Secondary | ICD-10-CM | POA: Diagnosis not present

## 2015-03-25 DIAGNOSIS — Z23 Encounter for immunization: Secondary | ICD-10-CM | POA: Diagnosis not present

## 2015-03-25 DIAGNOSIS — E876 Hypokalemia: Secondary | ICD-10-CM | POA: Diagnosis not present

## 2015-03-27 DIAGNOSIS — J209 Acute bronchitis, unspecified: Secondary | ICD-10-CM | POA: Diagnosis not present

## 2015-04-09 DIAGNOSIS — N390 Urinary tract infection, site not specified: Secondary | ICD-10-CM | POA: Diagnosis not present

## 2015-04-20 ENCOUNTER — Other Ambulatory Visit: Payer: Self-pay

## 2015-04-20 DIAGNOSIS — Z1231 Encounter for screening mammogram for malignant neoplasm of breast: Secondary | ICD-10-CM

## 2015-05-16 DIAGNOSIS — R1110 Vomiting, unspecified: Secondary | ICD-10-CM | POA: Diagnosis not present

## 2015-05-16 DIAGNOSIS — R0789 Other chest pain: Secondary | ICD-10-CM | POA: Diagnosis not present

## 2015-05-30 ENCOUNTER — Ambulatory Visit
Admission: RE | Admit: 2015-05-30 | Discharge: 2015-05-30 | Disposition: A | Discharge status: Home / Self Care | Payer: Medicare Other | Source: Ambulatory Visit

## 2015-05-30 DIAGNOSIS — Z1231 Encounter for screening mammogram for malignant neoplasm of breast: Secondary | ICD-10-CM

## 2015-07-13 DIAGNOSIS — E669 Obesity, unspecified: Secondary | ICD-10-CM | POA: Diagnosis not present

## 2015-07-13 DIAGNOSIS — F322 Major depressive disorder, single episode, severe without psychotic features: Secondary | ICD-10-CM | POA: Diagnosis not present

## 2015-07-13 DIAGNOSIS — E559 Vitamin D deficiency, unspecified: Secondary | ICD-10-CM | POA: Diagnosis not present

## 2015-07-13 DIAGNOSIS — Z0000 Encounter for general adult medical examination without abnormal findings: Secondary | ICD-10-CM | POA: Diagnosis not present

## 2015-07-13 DIAGNOSIS — E119 Type 2 diabetes mellitus without complications: Secondary | ICD-10-CM | POA: Diagnosis not present

## 2015-07-13 DIAGNOSIS — E7800 Pure hypercholesterolemia, unspecified: Secondary | ICD-10-CM | POA: Diagnosis not present

## 2015-07-13 DIAGNOSIS — I10 Essential (primary) hypertension: Secondary | ICD-10-CM | POA: Diagnosis not present

## 2015-07-13 DIAGNOSIS — K219 Gastro-esophageal reflux disease without esophagitis: Secondary | ICD-10-CM | POA: Diagnosis not present

## 2015-07-13 DIAGNOSIS — Z5181 Encounter for therapeutic drug level monitoring: Secondary | ICD-10-CM | POA: Diagnosis not present

## 2015-08-24 DIAGNOSIS — H40013 Open angle with borderline findings, low risk, bilateral: Secondary | ICD-10-CM | POA: Diagnosis not present

## 2015-08-24 DIAGNOSIS — H40053 Ocular hypertension, bilateral: Secondary | ICD-10-CM | POA: Diagnosis not present

## 2016-01-16 DIAGNOSIS — J019 Acute sinusitis, unspecified: Secondary | ICD-10-CM | POA: Diagnosis not present

## 2016-01-23 DIAGNOSIS — Z7984 Long term (current) use of oral hypoglycemic drugs: Secondary | ICD-10-CM | POA: Diagnosis not present

## 2016-01-23 DIAGNOSIS — I10 Essential (primary) hypertension: Secondary | ICD-10-CM | POA: Diagnosis not present

## 2016-01-23 DIAGNOSIS — E669 Obesity, unspecified: Secondary | ICD-10-CM | POA: Diagnosis not present

## 2016-01-23 DIAGNOSIS — E119 Type 2 diabetes mellitus without complications: Secondary | ICD-10-CM | POA: Diagnosis not present

## 2016-01-23 DIAGNOSIS — F322 Major depressive disorder, single episode, severe without psychotic features: Secondary | ICD-10-CM | POA: Diagnosis not present

## 2016-02-14 DIAGNOSIS — Z7984 Long term (current) use of oral hypoglycemic drugs: Secondary | ICD-10-CM | POA: Diagnosis not present

## 2016-02-14 DIAGNOSIS — H5213 Myopia, bilateral: Secondary | ICD-10-CM | POA: Diagnosis not present

## 2016-02-14 DIAGNOSIS — H40013 Open angle with borderline findings, low risk, bilateral: Secondary | ICD-10-CM | POA: Diagnosis not present

## 2016-02-14 DIAGNOSIS — H2513 Age-related nuclear cataract, bilateral: Secondary | ICD-10-CM | POA: Diagnosis not present

## 2016-02-14 DIAGNOSIS — D3132 Benign neoplasm of left choroid: Secondary | ICD-10-CM | POA: Diagnosis not present

## 2016-02-14 DIAGNOSIS — H52223 Regular astigmatism, bilateral: Secondary | ICD-10-CM | POA: Diagnosis not present

## 2016-02-14 DIAGNOSIS — H524 Presbyopia: Secondary | ICD-10-CM | POA: Diagnosis not present

## 2016-02-14 DIAGNOSIS — H04123 Dry eye syndrome of bilateral lacrimal glands: Secondary | ICD-10-CM | POA: Diagnosis not present

## 2016-02-14 DIAGNOSIS — E119 Type 2 diabetes mellitus without complications: Secondary | ICD-10-CM | POA: Diagnosis not present

## 2016-02-28 DIAGNOSIS — H04123 Dry eye syndrome of bilateral lacrimal glands: Secondary | ICD-10-CM | POA: Diagnosis not present

## 2016-03-13 DIAGNOSIS — H16223 Keratoconjunctivitis sicca, not specified as Sjogren's, bilateral: Secondary | ICD-10-CM | POA: Diagnosis not present

## 2016-03-23 DIAGNOSIS — Z23 Encounter for immunization: Secondary | ICD-10-CM | POA: Diagnosis not present

## 2016-04-08 DIAGNOSIS — M545 Low back pain: Secondary | ICD-10-CM | POA: Diagnosis not present

## 2016-04-27 DIAGNOSIS — N390 Urinary tract infection, site not specified: Secondary | ICD-10-CM | POA: Diagnosis not present

## 2016-05-09 ENCOUNTER — Other Ambulatory Visit: Payer: Self-pay | Admitting: Family Medicine

## 2016-05-09 DIAGNOSIS — Z1231 Encounter for screening mammogram for malignant neoplasm of breast: Secondary | ICD-10-CM

## 2016-05-14 DIAGNOSIS — J069 Acute upper respiratory infection, unspecified: Secondary | ICD-10-CM | POA: Diagnosis not present

## 2016-05-22 DIAGNOSIS — M545 Low back pain: Secondary | ICD-10-CM | POA: Diagnosis not present

## 2016-05-22 DIAGNOSIS — M62830 Muscle spasm of back: Secondary | ICD-10-CM | POA: Diagnosis not present

## 2016-05-22 DIAGNOSIS — M9903 Segmental and somatic dysfunction of lumbar region: Secondary | ICD-10-CM | POA: Diagnosis not present

## 2016-05-22 DIAGNOSIS — M47817 Spondylosis without myelopathy or radiculopathy, lumbosacral region: Secondary | ICD-10-CM | POA: Diagnosis not present

## 2016-05-23 DIAGNOSIS — M62830 Muscle spasm of back: Secondary | ICD-10-CM | POA: Diagnosis not present

## 2016-05-23 DIAGNOSIS — M545 Low back pain: Secondary | ICD-10-CM | POA: Diagnosis not present

## 2016-05-23 DIAGNOSIS — M9903 Segmental and somatic dysfunction of lumbar region: Secondary | ICD-10-CM | POA: Diagnosis not present

## 2016-05-24 DIAGNOSIS — M9903 Segmental and somatic dysfunction of lumbar region: Secondary | ICD-10-CM | POA: Diagnosis not present

## 2016-05-24 DIAGNOSIS — M545 Low back pain: Secondary | ICD-10-CM | POA: Diagnosis not present

## 2016-05-24 DIAGNOSIS — M62830 Muscle spasm of back: Secondary | ICD-10-CM | POA: Diagnosis not present

## 2016-05-28 DIAGNOSIS — M62830 Muscle spasm of back: Secondary | ICD-10-CM | POA: Diagnosis not present

## 2016-05-28 DIAGNOSIS — M545 Low back pain: Secondary | ICD-10-CM | POA: Diagnosis not present

## 2016-05-28 DIAGNOSIS — M9903 Segmental and somatic dysfunction of lumbar region: Secondary | ICD-10-CM | POA: Diagnosis not present

## 2016-05-29 DIAGNOSIS — M545 Low back pain: Secondary | ICD-10-CM | POA: Diagnosis not present

## 2016-05-29 DIAGNOSIS — M62830 Muscle spasm of back: Secondary | ICD-10-CM | POA: Diagnosis not present

## 2016-05-29 DIAGNOSIS — M9903 Segmental and somatic dysfunction of lumbar region: Secondary | ICD-10-CM | POA: Diagnosis not present

## 2016-05-30 DIAGNOSIS — M545 Low back pain: Secondary | ICD-10-CM | POA: Diagnosis not present

## 2016-05-30 DIAGNOSIS — M9903 Segmental and somatic dysfunction of lumbar region: Secondary | ICD-10-CM | POA: Diagnosis not present

## 2016-05-30 DIAGNOSIS — M62830 Muscle spasm of back: Secondary | ICD-10-CM | POA: Diagnosis not present

## 2016-06-06 DIAGNOSIS — M9903 Segmental and somatic dysfunction of lumbar region: Secondary | ICD-10-CM | POA: Diagnosis not present

## 2016-06-06 DIAGNOSIS — M62830 Muscle spasm of back: Secondary | ICD-10-CM | POA: Diagnosis not present

## 2016-06-06 DIAGNOSIS — M545 Low back pain: Secondary | ICD-10-CM | POA: Diagnosis not present

## 2016-06-07 DIAGNOSIS — M545 Low back pain: Secondary | ICD-10-CM | POA: Diagnosis not present

## 2016-06-07 DIAGNOSIS — M62830 Muscle spasm of back: Secondary | ICD-10-CM | POA: Diagnosis not present

## 2016-06-07 DIAGNOSIS — M9903 Segmental and somatic dysfunction of lumbar region: Secondary | ICD-10-CM | POA: Diagnosis not present

## 2016-06-11 DIAGNOSIS — J209 Acute bronchitis, unspecified: Secondary | ICD-10-CM | POA: Diagnosis not present

## 2016-06-11 DIAGNOSIS — J069 Acute upper respiratory infection, unspecified: Secondary | ICD-10-CM | POA: Diagnosis not present

## 2016-06-12 DIAGNOSIS — M9903 Segmental and somatic dysfunction of lumbar region: Secondary | ICD-10-CM | POA: Diagnosis not present

## 2016-06-12 DIAGNOSIS — M62830 Muscle spasm of back: Secondary | ICD-10-CM | POA: Diagnosis not present

## 2016-06-12 DIAGNOSIS — M545 Low back pain: Secondary | ICD-10-CM | POA: Diagnosis not present

## 2016-06-14 DIAGNOSIS — M9903 Segmental and somatic dysfunction of lumbar region: Secondary | ICD-10-CM | POA: Diagnosis not present

## 2016-06-14 DIAGNOSIS — M62830 Muscle spasm of back: Secondary | ICD-10-CM | POA: Diagnosis not present

## 2016-06-14 DIAGNOSIS — M545 Low back pain: Secondary | ICD-10-CM | POA: Diagnosis not present

## 2016-06-15 ENCOUNTER — Ambulatory Visit
Admission: RE | Admit: 2016-06-15 | Discharge: 2016-06-15 | Disposition: A | Discharge status: Home / Self Care | Payer: Medicare Other | Source: Ambulatory Visit | Attending: Family Medicine | Admitting: Family Medicine

## 2016-06-15 DIAGNOSIS — Z1231 Encounter for screening mammogram for malignant neoplasm of breast: Secondary | ICD-10-CM

## 2016-06-19 DIAGNOSIS — M62830 Muscle spasm of back: Secondary | ICD-10-CM | POA: Diagnosis not present

## 2016-06-19 DIAGNOSIS — M545 Low back pain: Secondary | ICD-10-CM | POA: Diagnosis not present

## 2016-06-19 DIAGNOSIS — M9903 Segmental and somatic dysfunction of lumbar region: Secondary | ICD-10-CM | POA: Diagnosis not present

## 2016-06-21 DIAGNOSIS — M9903 Segmental and somatic dysfunction of lumbar region: Secondary | ICD-10-CM | POA: Diagnosis not present

## 2016-06-21 DIAGNOSIS — M545 Low back pain: Secondary | ICD-10-CM | POA: Diagnosis not present

## 2016-06-21 DIAGNOSIS — M62830 Muscle spasm of back: Secondary | ICD-10-CM | POA: Diagnosis not present

## 2016-06-25 DIAGNOSIS — M9903 Segmental and somatic dysfunction of lumbar region: Secondary | ICD-10-CM | POA: Diagnosis not present

## 2016-06-25 DIAGNOSIS — M62830 Muscle spasm of back: Secondary | ICD-10-CM | POA: Diagnosis not present

## 2016-06-25 DIAGNOSIS — M545 Low back pain: Secondary | ICD-10-CM | POA: Diagnosis not present

## 2016-07-02 DIAGNOSIS — M9903 Segmental and somatic dysfunction of lumbar region: Secondary | ICD-10-CM | POA: Diagnosis not present

## 2016-07-02 DIAGNOSIS — M545 Low back pain: Secondary | ICD-10-CM | POA: Diagnosis not present

## 2016-07-02 DIAGNOSIS — M62830 Muscle spasm of back: Secondary | ICD-10-CM | POA: Diagnosis not present

## 2016-07-04 DIAGNOSIS — M545 Low back pain: Secondary | ICD-10-CM | POA: Diagnosis not present

## 2016-07-04 DIAGNOSIS — M9903 Segmental and somatic dysfunction of lumbar region: Secondary | ICD-10-CM | POA: Diagnosis not present

## 2016-07-04 DIAGNOSIS — M62830 Muscle spasm of back: Secondary | ICD-10-CM | POA: Diagnosis not present

## 2016-07-05 DIAGNOSIS — M9903 Segmental and somatic dysfunction of lumbar region: Secondary | ICD-10-CM | POA: Diagnosis not present

## 2016-07-05 DIAGNOSIS — M62830 Muscle spasm of back: Secondary | ICD-10-CM | POA: Diagnosis not present

## 2016-07-05 DIAGNOSIS — M545 Low back pain: Secondary | ICD-10-CM | POA: Diagnosis not present

## 2016-07-09 DIAGNOSIS — M62830 Muscle spasm of back: Secondary | ICD-10-CM | POA: Diagnosis not present

## 2016-07-09 DIAGNOSIS — M545 Low back pain: Secondary | ICD-10-CM | POA: Diagnosis not present

## 2016-07-09 DIAGNOSIS — M9903 Segmental and somatic dysfunction of lumbar region: Secondary | ICD-10-CM | POA: Diagnosis not present

## 2016-07-12 DIAGNOSIS — R609 Edema, unspecified: Secondary | ICD-10-CM | POA: Diagnosis not present

## 2016-07-12 DIAGNOSIS — M5441 Lumbago with sciatica, right side: Secondary | ICD-10-CM | POA: Diagnosis not present

## 2016-07-12 DIAGNOSIS — B353 Tinea pedis: Secondary | ICD-10-CM | POA: Diagnosis not present

## 2016-07-12 DIAGNOSIS — A419 Sepsis, unspecified organism: Secondary | ICD-10-CM

## 2016-07-12 DIAGNOSIS — Z7984 Long term (current) use of oral hypoglycemic drugs: Secondary | ICD-10-CM | POA: Diagnosis not present

## 2016-07-12 DIAGNOSIS — E119 Type 2 diabetes mellitus without complications: Secondary | ICD-10-CM | POA: Diagnosis not present

## 2016-07-12 HISTORY — DX: Sepsis, unspecified organism: A41.9

## 2016-07-16 DIAGNOSIS — M4727 Other spondylosis with radiculopathy, lumbosacral region: Secondary | ICD-10-CM | POA: Diagnosis not present

## 2016-07-16 DIAGNOSIS — M5116 Intervertebral disc disorders with radiculopathy, lumbar region: Secondary | ICD-10-CM | POA: Diagnosis not present

## 2016-07-16 DIAGNOSIS — M5117 Intervertebral disc disorders with radiculopathy, lumbosacral region: Secondary | ICD-10-CM | POA: Diagnosis not present

## 2016-07-16 DIAGNOSIS — M4726 Other spondylosis with radiculopathy, lumbar region: Secondary | ICD-10-CM | POA: Diagnosis not present

## 2016-07-17 ENCOUNTER — Encounter (HOSPITAL_COMMUNITY): Payer: Self-pay | Admitting: Emergency Medicine

## 2016-07-17 ENCOUNTER — Inpatient Hospital Stay (HOSPITAL_COMMUNITY)
Admission: EM | Admit: 2016-07-17 | Discharge: 2016-07-21 | DRG: 872 | Disposition: A | Discharge status: Home Health | Payer: Medicare Other | Attending: Internal Medicine | Admitting: Internal Medicine

## 2016-07-17 ENCOUNTER — Emergency Department (HOSPITAL_COMMUNITY): Payer: Medicare Other

## 2016-07-17 DIAGNOSIS — I471 Supraventricular tachycardia: Secondary | ICD-10-CM | POA: Diagnosis present

## 2016-07-17 DIAGNOSIS — R1110 Vomiting, unspecified: Secondary | ICD-10-CM | POA: Diagnosis not present

## 2016-07-17 DIAGNOSIS — R404 Transient alteration of awareness: Secondary | ICD-10-CM | POA: Diagnosis not present

## 2016-07-17 DIAGNOSIS — Z7984 Long term (current) use of oral hypoglycemic drugs: Secondary | ICD-10-CM

## 2016-07-17 DIAGNOSIS — N179 Acute kidney failure, unspecified: Secondary | ICD-10-CM

## 2016-07-17 DIAGNOSIS — E119 Type 2 diabetes mellitus without complications: Secondary | ICD-10-CM | POA: Diagnosis present

## 2016-07-17 DIAGNOSIS — E860 Dehydration: Secondary | ICD-10-CM | POA: Diagnosis present

## 2016-07-17 DIAGNOSIS — J111 Influenza due to unidentified influenza virus with other respiratory manifestations: Secondary | ICD-10-CM | POA: Insufficient documentation

## 2016-07-17 DIAGNOSIS — I509 Heart failure, unspecified: Secondary | ICD-10-CM | POA: Diagnosis not present

## 2016-07-17 DIAGNOSIS — R6520 Severe sepsis without septic shock: Secondary | ICD-10-CM | POA: Diagnosis present

## 2016-07-17 DIAGNOSIS — I110 Hypertensive heart disease with heart failure: Secondary | ICD-10-CM | POA: Diagnosis not present

## 2016-07-17 DIAGNOSIS — J101 Influenza due to other identified influenza virus with other respiratory manifestations: Secondary | ICD-10-CM | POA: Diagnosis present

## 2016-07-17 DIAGNOSIS — N281 Cyst of kidney, acquired: Secondary | ICD-10-CM | POA: Diagnosis present

## 2016-07-17 DIAGNOSIS — J9811 Atelectasis: Secondary | ICD-10-CM | POA: Diagnosis present

## 2016-07-17 DIAGNOSIS — A419 Sepsis, unspecified organism: Secondary | ICD-10-CM | POA: Diagnosis not present

## 2016-07-17 DIAGNOSIS — R400 Somnolence: Secondary | ICD-10-CM | POA: Diagnosis not present

## 2016-07-17 DIAGNOSIS — Z7982 Long term (current) use of aspirin: Secondary | ICD-10-CM

## 2016-07-17 DIAGNOSIS — R809 Proteinuria, unspecified: Secondary | ICD-10-CM | POA: Diagnosis present

## 2016-07-17 DIAGNOSIS — E785 Hyperlipidemia, unspecified: Secondary | ICD-10-CM | POA: Diagnosis present

## 2016-07-17 DIAGNOSIS — R05 Cough: Secondary | ICD-10-CM | POA: Diagnosis not present

## 2016-07-17 DIAGNOSIS — F329 Major depressive disorder, single episode, unspecified: Secondary | ICD-10-CM | POA: Diagnosis present

## 2016-07-17 DIAGNOSIS — N390 Urinary tract infection, site not specified: Secondary | ICD-10-CM | POA: Diagnosis not present

## 2016-07-17 DIAGNOSIS — R319 Hematuria, unspecified: Secondary | ICD-10-CM | POA: Diagnosis present

## 2016-07-17 DIAGNOSIS — I959 Hypotension, unspecified: Secondary | ICD-10-CM | POA: Diagnosis not present

## 2016-07-17 DIAGNOSIS — Z885 Allergy status to narcotic agent status: Secondary | ICD-10-CM

## 2016-07-17 DIAGNOSIS — Z79899 Other long term (current) drug therapy: Secondary | ICD-10-CM

## 2016-07-17 DIAGNOSIS — R509 Fever, unspecified: Secondary | ICD-10-CM | POA: Diagnosis not present

## 2016-07-17 DIAGNOSIS — K219 Gastro-esophageal reflux disease without esophagitis: Secondary | ICD-10-CM | POA: Diagnosis present

## 2016-07-17 DIAGNOSIS — R531 Weakness: Secondary | ICD-10-CM | POA: Diagnosis not present

## 2016-07-17 DIAGNOSIS — Z6839 Body mass index (BMI) 39.0-39.9, adult: Secondary | ICD-10-CM

## 2016-07-17 HISTORY — DX: Type 2 diabetes mellitus without complications: E11.9

## 2016-07-17 HISTORY — DX: Depression, unspecified: F32.A

## 2016-07-17 HISTORY — DX: Family history of other specified conditions: Z84.89

## 2016-07-17 HISTORY — DX: Pure hypercholesterolemia, unspecified: E78.00

## 2016-07-17 HISTORY — DX: Essential (primary) hypertension: I10

## 2016-07-17 HISTORY — DX: Personal history of urinary calculi: Z87.442

## 2016-07-17 HISTORY — DX: Heart failure, unspecified: I50.9

## 2016-07-17 HISTORY — DX: Unspecified osteoarthritis, unspecified site: M19.90

## 2016-07-17 HISTORY — DX: Major depressive disorder, single episode, unspecified: F32.9

## 2016-07-17 HISTORY — DX: Gastro-esophageal reflux disease without esophagitis: K21.9

## 2016-07-17 HISTORY — DX: Anemia, unspecified: D64.9

## 2016-07-17 LAB — URINALYSIS, ROUTINE W REFLEX MICROSCOPIC
Bilirubin Urine: NEGATIVE
Glucose, UA: NEGATIVE mg/dL
Ketones, ur: NEGATIVE mg/dL
NITRITE: NEGATIVE
PH: 5 (ref 5.0–8.0)
Protein, ur: >=300 mg/dL — AB
SPECIFIC GRAVITY, URINE: 1.03 (ref 1.005–1.030)

## 2016-07-17 LAB — TSH: TSH: 0.929 u[IU]/mL (ref 0.350–4.500)

## 2016-07-17 LAB — COMPREHENSIVE METABOLIC PANEL
ALK PHOS: 63 U/L (ref 38–126)
ALT: 42 U/L (ref 14–54)
AST: 64 U/L — AB (ref 15–41)
Albumin: 1.1 g/dL — ABNORMAL LOW (ref 3.5–5.0)
Anion gap: 11 (ref 5–15)
BILIRUBIN TOTAL: 0.5 mg/dL (ref 0.3–1.2)
BUN: 27 mg/dL — AB (ref 6–20)
CALCIUM: 7.9 mg/dL — AB (ref 8.9–10.3)
CO2: 25 mmol/L (ref 22–32)
Chloride: 97 mmol/L — ABNORMAL LOW (ref 101–111)
Creatinine, Ser: 1.24 mg/dL — ABNORMAL HIGH (ref 0.44–1.00)
GFR calc Af Amer: 50 mL/min — ABNORMAL LOW (ref >60)
GFR calc non Af Amer: 43 mL/min — ABNORMAL LOW (ref >60)
GLUCOSE: 163 mg/dL — AB (ref 65–99)
POTASSIUM: 3.3 mmol/L — AB (ref 3.5–5.1)
Sodium: 133 mmol/L — ABNORMAL LOW (ref 135–145)
TOTAL PROTEIN: 4.9 g/dL — AB (ref 6.5–8.1)

## 2016-07-17 LAB — CBC WITH DIFFERENTIAL/PLATELET
Basophils Absolute: 0 10*3/uL (ref 0.0–0.1)
Basophils Relative: 0 %
EOS ABS: 0 10*3/uL (ref 0.0–0.7)
Eosinophils Relative: 0 %
HCT: 43.1 % (ref 36.0–46.0)
HEMOGLOBIN: 14.9 g/dL (ref 12.0–15.0)
LYMPHS ABS: 0.5 10*3/uL — AB (ref 0.7–4.0)
LYMPHS PCT: 8 %
MCH: 31.4 pg (ref 26.0–34.0)
MCHC: 34.6 g/dL (ref 30.0–36.0)
MCV: 90.9 fL (ref 78.0–100.0)
MONOS PCT: 11 %
Monocytes Absolute: 0.7 10*3/uL (ref 0.1–1.0)
NEUTROS PCT: 81 %
Neutro Abs: 5.1 10*3/uL (ref 1.7–7.7)
Platelets: 253 10*3/uL (ref 150–400)
RBC: 4.74 MIL/uL (ref 3.87–5.11)
RDW: 13.5 % (ref 11.5–15.5)
WBC: 6.3 10*3/uL (ref 4.0–10.5)

## 2016-07-17 LAB — I-STAT CG4 LACTIC ACID, ED
Lactic Acid, Venous: 2.19 mmol/L (ref 0.5–1.9)
Lactic Acid, Venous: 1.39 mmol/L (ref 0.5–1.9)

## 2016-07-17 LAB — BRAIN NATRIURETIC PEPTIDE: B NATRIURETIC PEPTIDE 5: 29.3 pg/mL (ref 0.0–100.0)

## 2016-07-17 LAB — GLUCOSE, CAPILLARY
GLUCOSE-CAPILLARY: 124 mg/dL — AB (ref 65–99)
Glucose-Capillary: 131 mg/dL — ABNORMAL HIGH (ref 65–99)

## 2016-07-17 LAB — INFLUENZA PANEL BY PCR (TYPE A & B)
Influenza A By PCR: POSITIVE — AB
Influenza B By PCR: NEGATIVE

## 2016-07-17 LAB — I-STAT TROPONIN, ED: Troponin i, poc: 0.03 ng/mL (ref 0.00–0.08)

## 2016-07-17 LAB — PROTIME-INR
INR: 0.91
INR: 0.93
PROTHROMBIN TIME: 12.2 s (ref 11.4–15.2)
PROTHROMBIN TIME: 12.4 s (ref 11.4–15.2)

## 2016-07-17 LAB — APTT: aPTT: 29 seconds (ref 24–36)

## 2016-07-17 LAB — LACTIC ACID, PLASMA
LACTIC ACID, VENOUS: 1.7 mmol/L (ref 0.5–1.9)
Lactic Acid, Venous: 2 mmol/L (ref 0.5–1.9)

## 2016-07-17 LAB — PROCALCITONIN: Procalcitonin: 0.35 ng/mL

## 2016-07-17 IMAGING — CR DG CHEST 2V
2 series · 2 of 2 positions shown · non-contrast
Comparison: None.

CLINICAL DATA: Productive cough for 3 days.

EXAM:
CHEST  2 VIEW

[chest pa]
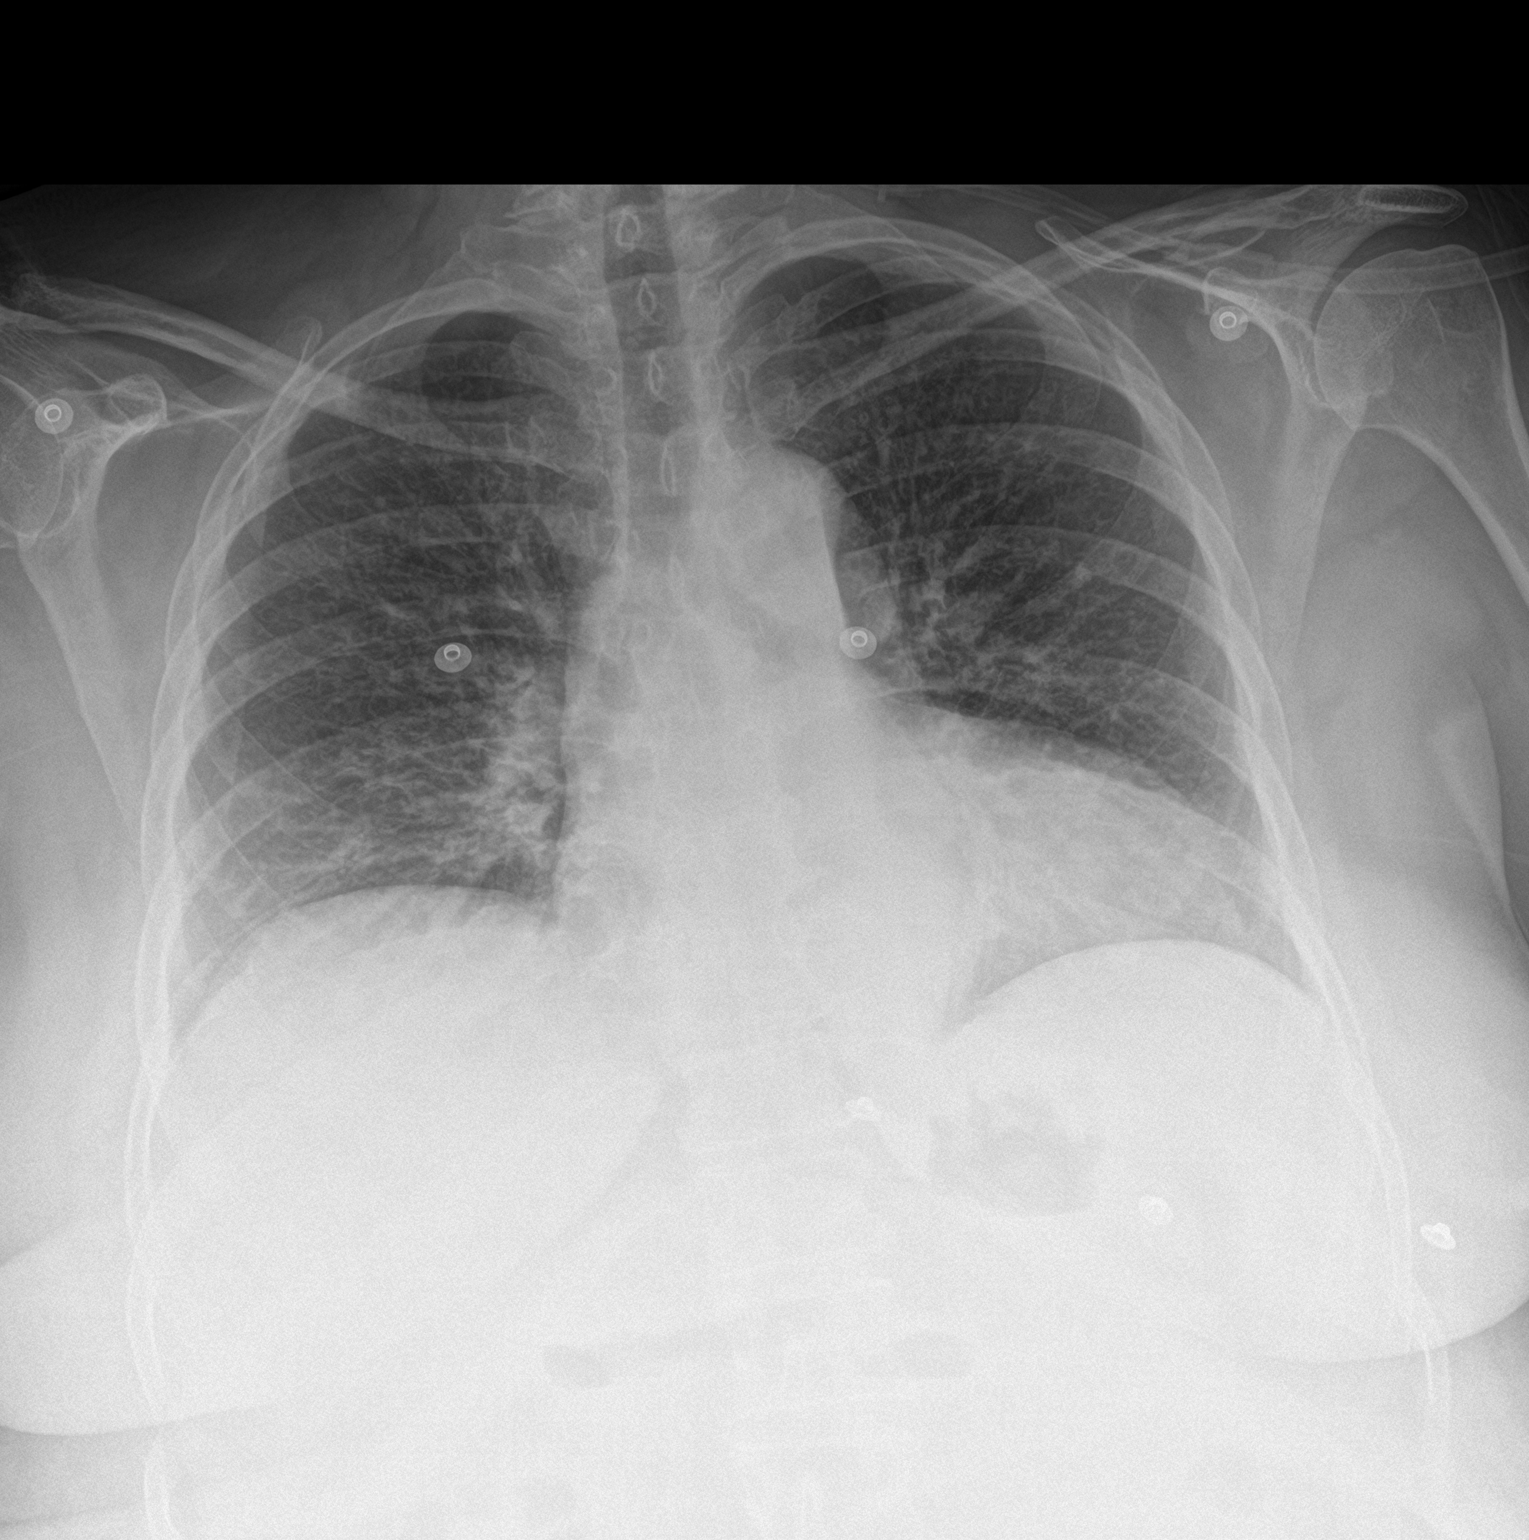

[chest lat]
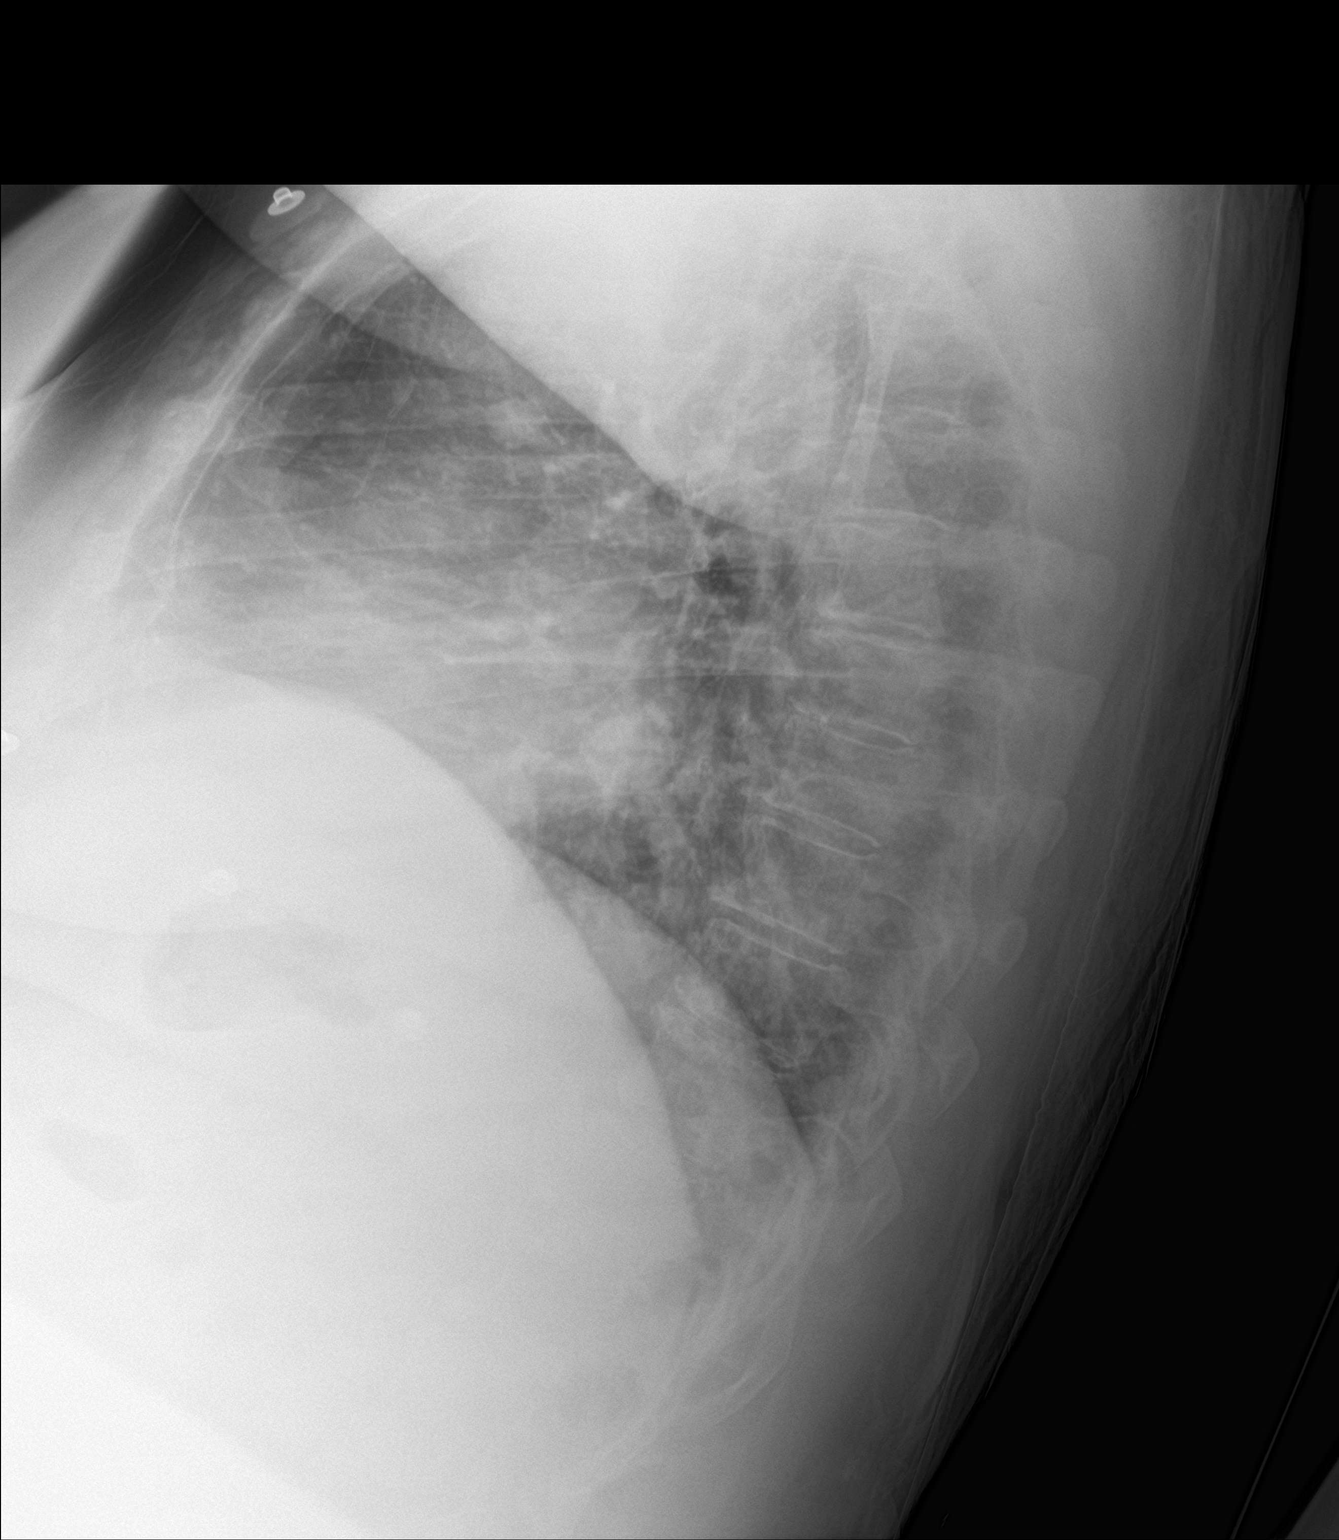

[2 of 2 positions shown; findings below may reference images not displayed]

FINDINGS: Lung volumes are low. There is some coarsening of the pulmonary
interstitium. Mild, streaky opacity right lung base has an
appearance most compatible atelectasis. No pneumothorax, or pleural
effusion. No focal bony abnormality.
IMPRESSION: Mild, streaky opacity in the right lung base most compatible
atelectasis in this low volume chest.

## 2016-07-17 MED ORDER — FUROSEMIDE 40 MG PO TABS: 40.0000 mg | ORAL_TABLET | Freq: Every day | ORAL | Status: DC

## 2016-07-17 MED ORDER — ACETAMINOPHEN 650 MG RE SUPP: 650.0000 mg | Freq: Four times a day (QID) | RECTAL | Status: DC | PRN

## 2016-07-17 MED ORDER — SODIUM CHLORIDE 0.9 % IV BOLUS (SEPSIS): 1000.0000 mL | Freq: Once | INTRAVENOUS | Status: DC

## 2016-07-17 MED ORDER — PIPERACILLIN-TAZOBACTAM 3.375 G IVPB 30 MIN
3.3750 g | Freq: Once | INTRAVENOUS | Status: AC
  Administered 2016-07-17: 3.375 g via INTRAVENOUS
  Filled 2016-07-17: qty 50

## 2016-07-17 MED ORDER — ASPIRIN 81 MG PO CHEW
81.0000 mg | CHEWABLE_TABLET | Freq: Every day | ORAL | Status: DC
  Administered 2016-07-18 – 2016-07-20 (×3): 81 mg via ORAL
  Filled 2016-07-17 (×3): qty 1

## 2016-07-17 MED ORDER — HEPARIN SODIUM (PORCINE) 5000 UNIT/ML IJ SOLN
5000.0000 [IU] | Freq: Three times a day (TID) | INTRAMUSCULAR | Status: DC
  Administered 2016-07-17 – 2016-07-21 (×11): 5000 [IU] via SUBCUTANEOUS
  Filled 2016-07-17 (×11): qty 1

## 2016-07-17 MED ORDER — SODIUM CHLORIDE 0.9 % IV BOLUS (SEPSIS)
1000.0000 mL | Freq: Once | INTRAVENOUS | Status: AC
  Administered 2016-07-17: 1000 mL via INTRAVENOUS

## 2016-07-17 MED ORDER — SENNOSIDES-DOCUSATE SODIUM 8.6-50 MG PO TABS: 1.0000 | ORAL_TABLET | Freq: Every evening | ORAL | Status: DC | PRN

## 2016-07-17 MED ORDER — PIPERACILLIN-TAZOBACTAM 3.375 G IVPB
3.3750 g | Freq: Three times a day (TID) | INTRAVENOUS | Status: DC
  Administered 2016-07-17: 3.375 g via INTRAVENOUS
  Filled 2016-07-17 (×2): qty 50

## 2016-07-17 MED ORDER — SODIUM CHLORIDE 0.9 % IV BOLUS (SEPSIS): 1000.0000 mL | Freq: Once | INTRAVENOUS | Status: AC

## 2016-07-17 MED ORDER — IPRATROPIUM-ALBUTEROL 0.5-2.5 (3) MG/3ML IN SOLN
3.0000 mL | RESPIRATORY_TRACT | Status: DC | PRN
  Administered 2016-07-18: 3 mL via RESPIRATORY_TRACT
  Filled 2016-07-17: qty 3

## 2016-07-17 MED ORDER — FUROSEMIDE 20 MG PO TABS: 20.0000 mg | ORAL_TABLET | Freq: Every day | ORAL | Status: DC

## 2016-07-17 MED ORDER — LISINOPRIL 20 MG PO TABS: 20.0000 mg | ORAL_TABLET | Freq: Every day | ORAL | Status: DC

## 2016-07-17 MED ORDER — ALPRAZOLAM 0.25 MG PO TABS: 0.5000 mg | ORAL_TABLET | ORAL | Status: DC

## 2016-07-17 MED ORDER — SODIUM CHLORIDE 0.9 % IV SOLN
INTRAVENOUS | Status: DC
  Administered 2016-07-17: 18:00:00 via INTRAVENOUS

## 2016-07-17 MED ORDER — DEXTROSE 5 % IV SOLN
1.0000 g | INTRAVENOUS | Status: DC
  Administered 2016-07-18 – 2016-07-21 (×4): 1 g via INTRAVENOUS
  Filled 2016-07-17 (×4): qty 10

## 2016-07-17 MED ORDER — AMLODIPINE BESYLATE 5 MG PO TABS: 10.0000 mg | ORAL_TABLET | Freq: Every day | ORAL | Status: DC

## 2016-07-17 MED ORDER — OSELTAMIVIR PHOSPHATE 30 MG PO CAPS
30.0000 mg | ORAL_CAPSULE | Freq: Two times a day (BID) | ORAL | Status: DC
  Administered 2016-07-17: 30 mg via ORAL
  Filled 2016-07-17 (×3): qty 1

## 2016-07-17 MED ORDER — MAGNESIUM CITRATE PO SOLN: 1.0000 | Freq: Once | ORAL | Status: DC | PRN

## 2016-07-17 MED ORDER — ONDANSETRON HCL 4 MG/2ML IJ SOLN
4.0000 mg | Freq: Four times a day (QID) | INTRAMUSCULAR | Status: DC | PRN
  Administered 2016-07-19: 4 mg via INTRAVENOUS
  Filled 2016-07-17: qty 2

## 2016-07-17 MED ORDER — PANTOPRAZOLE SODIUM 40 MG PO TBEC
40.0000 mg | DELAYED_RELEASE_TABLET | Freq: Every day | ORAL | Status: DC
  Administered 2016-07-17 – 2016-07-20 (×4): 40 mg via ORAL
  Filled 2016-07-17 (×4): qty 1

## 2016-07-17 MED ORDER — ONDANSETRON HCL 4 MG PO TABS: 4.0000 mg | ORAL_TABLET | Freq: Four times a day (QID) | ORAL | Status: DC | PRN

## 2016-07-17 MED ORDER — ACETAMINOPHEN 325 MG PO TABS: 650.0000 mg | ORAL_TABLET | Freq: Four times a day (QID) | ORAL | Status: DC | PRN

## 2016-07-17 MED ORDER — IPRATROPIUM-ALBUTEROL 0.5-2.5 (3) MG/3ML IN SOLN
3.0000 mL | Freq: Once | RESPIRATORY_TRACT | Status: AC
  Administered 2016-07-17: 3 mL via RESPIRATORY_TRACT
  Filled 2016-07-17: qty 3

## 2016-07-17 MED ORDER — POTASSIUM CHLORIDE CRYS ER 20 MEQ PO TBCR
20.0000 meq | EXTENDED_RELEASE_TABLET | Freq: Every day | ORAL | Status: DC
  Administered 2016-07-17: 20 meq via ORAL
  Filled 2016-07-17: qty 1

## 2016-07-17 MED ORDER — ATORVASTATIN CALCIUM 10 MG PO TABS
10.0000 mg | ORAL_TABLET | Freq: Every day | ORAL | Status: DC
  Administered 2016-07-17 – 2016-07-20 (×4): 10 mg via ORAL
  Filled 2016-07-17 (×4): qty 1

## 2016-07-17 MED ORDER — INSULIN ASPART 100 UNIT/ML ~~LOC~~ SOLN
0.0000 [IU] | Freq: Three times a day (TID) | SUBCUTANEOUS | Status: DC
  Administered 2016-07-17 – 2016-07-19 (×3): 1 [IU] via SUBCUTANEOUS
  Administered 2016-07-19: 2 [IU] via SUBCUTANEOUS
  Administered 2016-07-19 – 2016-07-20 (×3): 1 [IU] via SUBCUTANEOUS

## 2016-07-17 MED ORDER — LISINOPRIL-HYDROCHLOROTHIAZIDE 20-25 MG PO TABS: 1.0000 | ORAL_TABLET | Freq: Every day | ORAL | Status: DC

## 2016-07-17 MED ORDER — SERTRALINE HCL 50 MG PO TABS
75.0000 mg | ORAL_TABLET | Freq: Every day | ORAL | Status: DC
  Administered 2016-07-17 – 2016-07-20 (×4): 75 mg via ORAL
  Filled 2016-07-17 (×5): qty 1

## 2016-07-17 MED ORDER — BISACODYL 10 MG RE SUPP: 10.0000 mg | Freq: Every day | RECTAL | Status: DC | PRN

## 2016-07-17 MED ORDER — VANCOMYCIN HCL 10 G IV SOLR
1500.0000 mg | Freq: Once | INTRAVENOUS | Status: AC
  Administered 2016-07-17: 1500 mg via INTRAVENOUS
  Filled 2016-07-17: qty 1500

## 2016-07-17 MED ORDER — TIZANIDINE HCL 4 MG PO TABS
4.0000 mg | ORAL_TABLET | Freq: Three times a day (TID) | ORAL | Status: DC | PRN
Start: 2016-07-17 — End: 2016-07-19

## 2016-07-17 MED ORDER — HYDROCHLOROTHIAZIDE 25 MG PO TABS: 25.0000 mg | ORAL_TABLET | Freq: Every day | ORAL | Status: DC

## 2016-07-17 MED ORDER — VANCOMYCIN HCL IN DEXTROSE 750-5 MG/150ML-% IV SOLN
750.0000 mg | Freq: Two times a day (BID) | INTRAVENOUS | Status: DC
  Filled 2016-07-17: qty 150

## 2016-07-17 MED ORDER — ETODOLAC 400 MG PO TABS: 400.0000 mg | ORAL_TABLET | Freq: Two times a day (BID) | ORAL | Status: DC | PRN

## 2016-07-17 NOTE — H&P (Signed)
History and Physical    Michaela Santana VVO:[id number] DOB: 1946-05-29 DOA: 07/17/2016   PCP: Marjorie Smolder, MD   Patient coming from:  Home    Chief Complaint: "I'm here for flu and abnormal EKG"  HPI: Michaela Santana is a 71 y.o. female with medical history significant for HTN, DM sent from PCP's office after presenting with flu symptoms, and diagnosed with influenza. She had increasing fever up to 100.2, chills, myalgia, shortness of breath over the last 2 days. She also reported mild lower extremity edema. No chest pain or palpitations. SHe has no history of CHF. She does not have a cardiology and never had a catheterization.  No nausea, vomiting or diarrhea  No recent trips. She denies any sick contacts .She is being admitted for management of her respiratory and cardiac symptoms   ED Course:  BP 118/66 (BP Location: Left Arm)   Pulse 97   Temp 99 F (37.2 C) (Oral)   Resp (!) 22   Ht '5\' 2"'$  (1.575 m)   Wt 89.9 kg (198 lb 4.8 oz)   SpO2 94%   BMI 36.27 kg/m   sodium 133 potassium 3.3 creatinine 1.24  lactic acid 2.19  BNP pending  Influenza A positive UA  blood cultures and urine culture pending white count 6.3 hemoglobin 14.9 platelets 253 glucose 163 Tn pending chest x-ray with right lung base atelectasis and opacity compatible with atelectasis received vancomycin and Zosyn  Review of Systems: As per HPI otherwise 10 point review of systems negative.   Past Medical History:  Diagnosis Date  . Diabetes mellitus without complication ([place])   . Hypertension     Past Surgical History:  Procedure Laterality Date  . CHOLECYSTECTOMY      Social History Social History   Social History  . Marital status: Married    Spouse name: N/A  . Number of children: N/A  . Years of education: N/A   Occupational History  . Not on file.   Social History Main Topics  . Smoking status: Never Smoker  . Smokeless tobacco: Not on file  . Alcohol use No  . Drug use: Unknown  .  Sexual activity: Not on file   Other Topics Concern  . Not on file   Social History Narrative  . No narrative on file     Allergies  Allergen Reactions  . Codeine Hypertension    No family history on file.    Prior to Admission medications   Medication Sig Start Date End Date Taking? Authorizing Provider  ALPRAZolam Duanne Moron) 0.5 MG tablet Take 0.5 mg by mouth See admin instructions. Take 1 tablet by mouth one hour prior to procedure on 07/16/16   Yes Historical Provider, MD  amLODipine (NORVASC) 10 MG tablet Take 10 mg by mouth daily.   Yes Historical Provider, MD  aspirin 81 MG chewable tablet Chew 81 mg by mouth daily.   Yes Historical Provider, MD  atorvastatin (LIPITOR) 10 MG tablet Take 10 mg by mouth daily.   Yes Historical Provider, MD  Calcium Carbonate-Vitamin D 600-200 MG-UNIT TABS Take 1 tablet by mouth 2 (two) times daily.   Yes Historical Provider, MD  cetirizine (ZYRTEC) 10 MG chewable tablet Chew 10 mg by mouth daily.   Yes Historical Provider, MD  econazole nitrate 1 % cream Apply 1 application topically daily.   Yes Historical Provider, MD  etodolac (LODINE) 400 MG tablet Take 400 mg by mouth 2 (two) times daily as needed for mild  pain.   Yes Historical Provider, MD  fluticasone (FLONASE) 50 MCG/ACT nasal spray Place 2 sprays into both nostrils daily as needed for allergies or rhinitis.   Yes Historical Provider, MD  furosemide (LASIX) 20 MG tablet Take 20 mg by mouth daily.   Yes Historical Provider, MD  glipiZIDE (GLUCOTROL XL) 5 MG 24 hr tablet Take 5 mg by mouth daily with breakfast.   Yes Historical Provider, MD  ibuprofen (ADVIL,MOTRIN) 100 MG/5ML suspension Take 200 mg by mouth 3 (three) times daily as needed for mild pain.   Yes Historical Provider, MD  ipratropium (ATROVENT) 0.06 % nasal spray Place 1 spray into both nostrils daily as needed for allergies. 06/11/16  Yes Historical Provider, MD  lansoprazole (PREVACID) 30 MG capsule Take 30 mg by mouth daily at 12  noon.   Yes Historical Provider, MD  lisinopril-hydrochlorothiazide (PRINZIDE,ZESTORETIC) 20-25 MG tablet Take 1 tablet by mouth daily.   Yes Historical Provider, MD  magnesium oxide (MAG-OX) 400 MG tablet Take 400-800 mg by mouth See admin instructions. '800mg'$  in am, '400mg'$  in afternoon and in pm   Yes Historical Provider, MD  metFORMIN (GLUCOPHAGE) 500 MG tablet Take 500 mg by mouth 2 (two) times daily with a meal.   Yes Historical Provider, MD  Multiple Vitamin (MULTIVITAMIN) tablet Take 1 tablet by mouth daily.   Yes Historical Provider, MD  potassium chloride (K-DUR,KLOR-CON) 10 MEQ tablet Take 20 mEq by mouth daily.   Yes Historical Provider, MD  sertraline (ZOLOFT) 50 MG tablet Take 75 mg by mouth daily.   Yes Historical Provider, MD  tiZANidine (ZANAFLEX) 4 MG tablet Take 4 mg by mouth 3 (three) times daily as needed for muscle spasms.   Yes Historical Provider, MD  Vitamin D, Ergocalciferol, (DRISDOL) 50000 units CAPS capsule Take 50,000 Units by mouth every 7 (seven) days.   Yes Historical Provider, MD    Physical Exam:  Vitals:   07/17/16 1530 07/17/16 1545 07/17/16 1606 07/17/16 1707  BP: 101/70 105/67 106/64 118/66  Pulse: 94 93 92 97  Resp: 15 21 (!) 27 (!) 22  Temp:    99 F (37.2 C)  TempSrc:    Oral  SpO2: 95% 94% 92% 94%  Weight:    89.9 kg (198 lb 4.8 oz)  Height:    '5\' 2"'$  (1.575 m)   Constitutional: NAD, but uncomfortable due to cough Eyes: PERRL, lids and conjunctivae normal ENMT: Mucous membranes are moist, without exudate or lesions  Neck: normal, supple, no masses, no thyromegaly Respiratory: Bilateral wheezing , mild LLL rhonchi.  Normal respiratory effort  Cardiovascular: Regular rate and rhythm, no murmurs / rubs / gallops. 1+  Bilateral lower extremity edema. 2+ pedal pulses. No carotid bruits.  Abdomen: Soft, non tender, No hepatosplenomegaly. Bowel sounds positive.  Musculoskeletal: no clubbing / cyanosis. Moves all extremities Skin: no jaundice, No  lesions.  Neurologic: Sensation intact  Strength equal in all extremities  Psychiatric:   Alert and oriented x 3. Normal mood.     Labs on Admission: I have personally reviewed following labs and imaging studies  CBC:  Recent Labs Lab 07/17/16 1234  WBC 6.3  NEUTROABS 5.1  HGB 14.9  HCT 43.1  MCV 90.9  PLT 578    Basic Metabolic Panel:  Recent Labs Lab 07/17/16 1234  NA 133*  K 3.3*  CL 97*  CO2 25  GLUCOSE 163*  BUN 27*  CREATININE 1.24*  CALCIUM 7.9*    GFR: Estimated Creatinine Clearance: 44.6 mL/min (  by C-G formula based on SCr of 1.24 mg/dL (H)).  Liver Function Tests:  Recent Labs Lab 07/17/16 1234  AST 64*  ALT 42  ALKPHOS 63  BILITOT 0.5  PROT 4.9*  ALBUMIN 1.1*   No results for input(s): LIPASE, AMYLASE in the last 168 hours. No results for input(s): AMMONIA in the last 168 hours.  Coagulation Profile:  Recent Labs Lab 07/17/16 1234  INR 0.91    Cardiac Enzymes: No results for input(s): CKTOTAL, CKMB, CKMBINDEX, TROPONINI in the last 168 hours.  BNP (last 3 results) No results for input(s): PROBNP in the last 8760 hours.  HbA1C: No results for input(s): HGBA1C in the last 72 hours.  CBG:  Recent Labs Lab 07/17/16 1714  GLUCAP 124*    Lipid Profile: No results for input(s): CHOL, HDL, LDLCALC, TRIG, CHOLHDL, LDLDIRECT in the last 72 hours.  Thyroid Function Tests: No results for input(s): TSH, T4TOTAL, FREET4, T3FREE, THYROIDAB in the last 72 hours.  Anemia Panel: No results for input(s): VITAMINB12, FOLATE, FERRITIN, TIBC, IRON, RETICCTPCT in the last 72 hours.  Urine analysis: No results found for: COLORURINE, APPEARANCEUR, LABSPEC, PHURINE, GLUCOSEU, HGBUR, BILIRUBINUR, KETONESUR, PROTEINUR, UROBILINOGEN, NITRITE, LEUKOCYTESUR  Sepsis Labs: '@LABRCNTIP'$ (procalcitonin:4,lacticidven:4) ) Recent Results (from the past 240 hour(s))  Culture, blood (Routine x 2)     Status: None (Preliminary result)   Collection Time:  07/17/16 12:40 PM  Result Value Ref Range Status   Specimen Description BLOOD LEFT ANTECUBITAL  Final   Special Requests BOTTLES DRAWN AEROBIC AND ANAEROBIC 5CC  Final   Culture NO GROWTH <12 HOURS  Final   Report Status PENDING  Incomplete     Radiological Exams on Admission: Dg Chest 2 View  Result Date: 07/17/2016 CLINICAL DATA:  Productive cough for 3 days. EXAM: CHEST  2 VIEW COMPARISON:  None. FINDINGS: Lung volumes are low. There is some coarsening of the pulmonary interstitium. Mild, streaky opacity right lung base has an appearance most compatible atelectasis. No pneumothorax, or pleural effusion. No focal bony abnormality. IMPRESSION: Mild, streaky opacity in the right lung base most compatible atelectasis in this low volume chest. Electronically Signed   By: Inge Rise M.D.   On: 07/17/2016 13:05    EKG: Independently reviewed.  Assessment/Plan Active Problems:   CHF (congestive heart failure) (HCC)   Sepsis (HCC)   Influenza   odium 133 potassium 3.3 creatinine 1.24  lactic acid 2.19  BNP pending  Influenza A positive UA  blood cultures and urine culture pending white count 6.3 hemoglobin 14.9 platelets 253 glucose 163 Tn pending chest x-ray with right lung base atelectasis and opacity compatible with atelectasis received vancomycin and Zosyn   Flu like symptoms   Influenza A + Temp 99 VSS, POx normal in RA  WBC 6.3  Antibiotics delivered in the ED with Vanc and Zosyn Lactic acid 2.19  3L  NS to date  Admit to tele obs  Droplet precaution Follow lactic acid q 6 hrs Follow blood and urine cultures IV fluids  Procalcitonin order set  UA, cultures  Recheck influenza panel   resp vir panel    Presumed Congestive  heart failure, current weight 87.5 kg  CXR Mild, streaky opacity in the right lung base most compatible atelectasis. EKG with LVH,Junctional tachycardia. BNP pending . Never had a 2 D echo . Received IV Lasix in ED. PAtient chest pain free. Risk  factors include obesity, HTN, HLD   Daily weights, strict I/O Lasix  Careful use of IVF  TSH BNP  Will obtain Cards eval versus OP  Visit pending on those results.  Hypertension BP 103/69   Pulse 95   Controlled Continue home anti-hypertensive medications    Hyperlipidemia Continue home statins  GERD, no acute symptoms: Continue PPI    Type II Diabetes Current blood sugar level is 163 No results found for: HGBA1C Hgb A1C Hold home oral diabetic medications.   SSI Heart healthy carb modified diet.  Depression Continue Zoloft    DVT prophylaxis: Heparin   Code Status:   Full    Family Communication:  Discussed with patient Disposition Plan: Expect patient to be discharged to home after condition improves Consults called:    None Admission status:   Tele Obs   Jammal Sarr E, PA-C [facility]   07/17/2016, 5:40 PM

## 2016-07-17 NOTE — ED Notes (Signed)
Dr. Jeneen Rinks and PA Devona Konig made aware of patient, PA Devona Konig in to assess patient

## 2016-07-17 NOTE — Progress Notes (Signed)
CRITICAL VALUE ALERT  Critical value received:  Lactic Acid 2.0  Date of notification:  07/17/2016  Time of notification:  20:20  Critical value read back:Yes.    Nurse who received alert:  Coleta Grosshans   MD notified (1st page):  Baltazar Najjar  Time of first page:  20:25  MD notified (2nd page):K.Kirby   Time of second page:21:00  Responding MD:  None  Time MD responded:  N/A  No new orders. Will continue monitor.  Warren Kugelman, RN

## 2016-07-17 NOTE — Progress Notes (Signed)
Patient wants daughters Truman Hayward and  [name] updated if they call for update.

## 2016-07-17 NOTE — ED Provider Notes (Signed)
[place] DEPT Provider Note   CSN: [id number] Arrival date & time: 07/17/16  1210     History   Chief Complaint Chief Complaint  Patient presents with  . Abnormal ECG  . Influenza    HPI Michaela Santana is a 71 y.o. female.  HPI  71 y.o. female with a hx of DM, HTN, presents to the Emergency Department today via EMS from PCP office due to abnormal EKG. Showed junctional rhythm on EKG. Notes swelling in arms and legs since Sunday as well as feeling fatigued, which is why she went to PCP in the first place. Diagnosed with flu at PCP office. Also given Lasix in ED. PT denies CP/SOB/ABD pain currently. No N/V/D. Notes cough with URI symptoms. Does not endorse fevers at home. No hx ACS. No Cardiac hx. No hx CHF. No other symptoms noted.    Past Medical History:  Diagnosis Date  . Diabetes mellitus without complication ([place])   . Hypertension     There are no active problems to display for this patient.   Past Surgical History:  Procedure Laterality Date  . CHOLECYSTECTOMY      OB History    No data available       Home Medications    Prior to Admission medications   Not on File    Family History No family history on file.  Social History Social History  Substance Use Topics  . Smoking status: Never Smoker  . Smokeless tobacco: Not on file  . Alcohol use No     Allergies   Patient has no known allergies.   Review of Systems Review of Systems ROS reviewed and all are negative for acute change except as noted in the HPI  Physical Exam Updated Vital Signs BP 99/71   Pulse 102   Temp 100.2 F (37.9 C) (Oral)   Resp 23   Ht '5\' 2"'$  (1.575 m)   Wt 87.5 kg   SpO2 98%   BMI 35.30 kg/m   Physical Exam  Constitutional: She is oriented to person, place, and time. Vital signs are normal. She appears well-developed and well-nourished. No distress.  HENT:  Head: Normocephalic and atraumatic.  Right Ear: Hearing, tympanic membrane, external ear and ear  canal normal.  Left Ear: Hearing, tympanic membrane, external ear and ear canal normal.  Nose: Nose normal.  Mouth/Throat: Uvula is midline, oropharynx is clear and moist and mucous membranes are normal. No trismus in the jaw. No oropharyngeal exudate, posterior oropharyngeal erythema or tonsillar abscesses.  Eyes: Conjunctivae and EOM are normal. Pupils are equal, round, and reactive to light.  Neck: Normal range of motion. Neck supple. No tracheal deviation present.  Cardiovascular: Regular rhythm, S1 normal, S2 normal, normal heart sounds, intact distal pulses and normal pulses.  Tachycardia present.   Pulmonary/Chest: Effort normal. No respiratory distress. She has no decreased breath sounds. She has wheezes in the right upper field, the right lower field, the left upper field and the left lower field. She has rhonchi in the left lower field. She has no rales.  Abdominal: Normal appearance and bowel sounds are normal. There is no tenderness.  Musculoskeletal: Normal range of motion.  Pitting Edema +1  Neurological: She is alert and oriented to person, place, and time.  Skin: Skin is warm and dry.  Psychiatric: She has a normal mood and affect. Her speech is normal and behavior is normal. Thought content normal.  Nursing note and vitals reviewed.  ED Treatments / Results  Labs (all labs ordered are listed, but only abnormal results are displayed) Labs Reviewed  COMPREHENSIVE METABOLIC PANEL - Abnormal; Notable for the following:       Result Value   Sodium 133 (*)    Potassium 3.3 (*)    Chloride 97 (*)    Glucose, Bld 163 (*)    BUN 27 (*)    Creatinine, Ser 1.24 (*)    Calcium 7.9 (*)    Total Protein 4.9 (*)    Albumin 1.1 (*)    AST 64 (*)    GFR calc non Af Amer 43 (*)    GFR calc Af Amer 50 (*)    All other components within normal limits  CBC WITH DIFFERENTIAL/PLATELET - Abnormal; Notable for the following:    Lymphs Abs 0.5 (*)    All other components within normal  limits  I-STAT CG4 LACTIC ACID, ED - Abnormal; Notable for the following:    Lactic Acid, Venous 2.19 (*)    All other components within normal limits  CULTURE, BLOOD (ROUTINE X 2)  CULTURE, BLOOD (ROUTINE X 2)  URINE CULTURE  PROTIME-INR  URINALYSIS, ROUTINE W REFLEX MICROSCOPIC  BRAIN NATRIURETIC PEPTIDE  INFLUENZA PANEL BY PCR (TYPE A & B)  I-STAT TROPOININ, ED   EKG  EKG Interpretation None      Radiology Dg Chest 2 View  Result Date: 07/17/2016 CLINICAL DATA:  Productive cough for 3 days. EXAM: CHEST  2 VIEW COMPARISON:  None. FINDINGS: Lung volumes are low. There is some coarsening of the pulmonary interstitium. Mild, streaky opacity right lung base has an appearance most compatible atelectasis. No pneumothorax, or pleural effusion. No focal bony abnormality. IMPRESSION: Mild, streaky opacity in the right lung base most compatible atelectasis in this low volume chest. Electronically Signed   By: Inge Rise M.D.   On: 07/17/2016 13:05    Procedures Procedures (including critical care time)  Medications Ordered in ED Medications  ipratropium-albuterol (DUONEB) 0.5-2.5 (3) MG/3ML nebulizer solution 3 mL (not administered)  sodium chloride 0.9 % bolus 1,000 mL (1,000 mLs Intravenous New Bag/Given 07/17/16 1323)    And  sodium chloride 0.9 % bolus 1,000 mL (not administered)    And  sodium chloride 0.9 % bolus 1,000 mL (not administered)   Initial Impression / Assessment and Plan / ED Course  I have reviewed the triage vital signs and the nursing notes.  Pertinent labs & imaging results that were available during my care of the patient were reviewed by me and considered in my medical decision making (see chart for details).    Final Clinical Impressions(s) / ED Diagnoses  {I have reviewed and evaluated the relevant laboratory values. {I have reviewed and evaluated the relevant imaging studies. {I have interpreted the relevant EKG. {I have reviewed the relevant previous  healthcare records. {I have reviewed EMS Documentation. {I obtained HPI from historian. {Patient discussed with supervising physician.  ED Course:  Assessment: Pt is a 32yF with hx DM, HTN who presents with cough/congestion as well as abnormal ECG by PCP. Diagnosed with Influenza in office. Sent to hospital due to new junctional rhythm on EKG as well as swelling. No hx CHF. Given lasix in office. On exam, pt in Nontoxic/nonseptic appearing. VS with slight tachycardia. Temp 100.88F. Lungs bilateral wheeze. Abdomen nontender soft. No pain currently. iStat Lactate 2.19. On recheck of patient, BP 84 systolic. WBC 6.3. Code Sepsis initiated. Made NPO. Given vanc/zosyn abx to cover unknown source. CXR with streaky opacity  compatible with atelectasis. NS given as per Sepsis protocol. Likely Influenza with new onset CHF. PT would benefit from echo as inpatient with further evaluation. Plan is to Admit to medicine. Repeat sepsis assessment completed.  Disposition/Plan:  Admit Pt acknowledges and agrees with plan  Supervising Physician Tanna Furry, MD  Final diagnoses:  Congestive heart failure, unspecified congestive heart failure chronicity, unspecified congestive heart failure type ([place])  Sepsis, due to unspecified organism [facility])    New Prescriptions New Prescriptions   No medications on file       Shary Decamp, PA-C 07/17/16 1437    Tanna Furry, MD 07/17/16 [phone]

## 2016-07-17 NOTE — ED Notes (Signed)
Patient transported to X-ray 

## 2016-07-17 NOTE — Progress Notes (Signed)
Pharmacy Antibiotic Note  AZARA GEMME is a 71 y.o. female admitted on 07/17/2016 with sepsis. Pt was diagnosed with influenza in PCP office and sent to ED due to EKG changes. In ED pt was hypotensive and code sepsis called. Pharmacy has been consulted for vancomycin and Zosyn dosing.   Plan: -Vancomycin '1500mg'$  IV x1 -Vancomycin '750mg'$  IV q12h -Zosyn 3.375g IV x1 over 30 min -Zosyn 3.375g IV q8h EI -Monitor renal function, cultures, LOT, VT as indicated  Height: '5\' 2"'$  (157.5 cm) Weight: 193 lb (87.5 kg) IBW/kg (Calculated) : 50.1  Temp (24hrs), Avg:100.2 F (37.9 C), Min:100.2 F (37.9 C), Max:100.2 F (37.9 C)   Recent Labs Lab 07/17/16 1234 07/17/16 1304  WBC 6.3  --   LATICACIDVEN  --  2.19*    CrCl cannot be calculated (No order found.).    No Known Allergies  Antimicrobials this admission: 2/6 Vancomycin >>  2/6 Zosyn >>   Dose adjustments this admission: none  Microbiology results: 2/6 BCx: IP 2/6 UCx: IP   Thank you for allowing pharmacy to be a part of this patient's care.  Arrie Senate, PharmD PGY-1 Pharmacy Resident Pager: [phone] 07/17/2016

## 2016-07-17 NOTE — ED Triage Notes (Signed)
Pt arrives via [facility] from PCP office, pt c/o swelling in arms and legs since Sunday as well as feeling fatigued and exhibiting flu like symptoms, pt diagnosed with flu at PCP office, found to be in junctional rhythm on ekg, no cardiac hx that patient is aware of. Pt does take lasix. Pt a/ox4, skin pale.

## 2016-07-17 NOTE — ED Notes (Signed)
Attempted report 

## 2016-07-18 ENCOUNTER — Inpatient Hospital Stay (HOSPITAL_COMMUNITY): Payer: Medicare Other

## 2016-07-18 ENCOUNTER — Observation Stay (HOSPITAL_COMMUNITY): Payer: Medicare Other

## 2016-07-18 ENCOUNTER — Ambulatory Visit (HOSPITAL_COMMUNITY): Payer: Medicare Other

## 2016-07-18 ENCOUNTER — Other Ambulatory Visit: Payer: Self-pay

## 2016-07-18 DIAGNOSIS — R809 Proteinuria, unspecified: Secondary | ICD-10-CM | POA: Diagnosis present

## 2016-07-18 DIAGNOSIS — R509 Fever, unspecified: Secondary | ICD-10-CM | POA: Diagnosis not present

## 2016-07-18 DIAGNOSIS — R0602 Shortness of breath: Secondary | ICD-10-CM | POA: Diagnosis not present

## 2016-07-18 DIAGNOSIS — Z7982 Long term (current) use of aspirin: Secondary | ICD-10-CM | POA: Diagnosis not present

## 2016-07-18 DIAGNOSIS — N179 Acute kidney failure, unspecified: Secondary | ICD-10-CM | POA: Diagnosis present

## 2016-07-18 DIAGNOSIS — K219 Gastro-esophageal reflux disease without esophagitis: Secondary | ICD-10-CM | POA: Diagnosis present

## 2016-07-18 DIAGNOSIS — E785 Hyperlipidemia, unspecified: Secondary | ICD-10-CM | POA: Diagnosis present

## 2016-07-18 DIAGNOSIS — A419 Sepsis, unspecified organism: Principal | ICD-10-CM

## 2016-07-18 DIAGNOSIS — N390 Urinary tract infection, site not specified: Secondary | ICD-10-CM | POA: Diagnosis not present

## 2016-07-18 DIAGNOSIS — E860 Dehydration: Secondary | ICD-10-CM | POA: Diagnosis present

## 2016-07-18 DIAGNOSIS — F329 Major depressive disorder, single episode, unspecified: Secondary | ICD-10-CM | POA: Diagnosis present

## 2016-07-18 DIAGNOSIS — N281 Cyst of kidney, acquired: Secondary | ICD-10-CM | POA: Diagnosis present

## 2016-07-18 DIAGNOSIS — I951 Orthostatic hypotension: Secondary | ICD-10-CM | POA: Diagnosis not present

## 2016-07-18 DIAGNOSIS — I9589 Other hypotension: Secondary | ICD-10-CM | POA: Diagnosis not present

## 2016-07-18 DIAGNOSIS — R6520 Severe sepsis without septic shock: Secondary | ICD-10-CM | POA: Diagnosis present

## 2016-07-18 DIAGNOSIS — I509 Heart failure, unspecified: Secondary | ICD-10-CM

## 2016-07-18 DIAGNOSIS — Z6839 Body mass index (BMI) 39.0-39.9, adult: Secondary | ICD-10-CM | POA: Diagnosis not present

## 2016-07-18 DIAGNOSIS — Z7984 Long term (current) use of oral hypoglycemic drugs: Secondary | ICD-10-CM | POA: Diagnosis not present

## 2016-07-18 DIAGNOSIS — I959 Hypotension, unspecified: Secondary | ICD-10-CM | POA: Diagnosis not present

## 2016-07-18 DIAGNOSIS — E119 Type 2 diabetes mellitus without complications: Secondary | ICD-10-CM | POA: Diagnosis present

## 2016-07-18 DIAGNOSIS — J101 Influenza due to other identified influenza virus with other respiratory manifestations: Secondary | ICD-10-CM | POA: Diagnosis present

## 2016-07-18 DIAGNOSIS — Z885 Allergy status to narcotic agent status: Secondary | ICD-10-CM | POA: Diagnosis not present

## 2016-07-18 DIAGNOSIS — R319 Hematuria, unspecified: Secondary | ICD-10-CM | POA: Diagnosis not present

## 2016-07-18 DIAGNOSIS — I471 Supraventricular tachycardia: Secondary | ICD-10-CM | POA: Diagnosis present

## 2016-07-18 DIAGNOSIS — J111 Influenza due to unidentified influenza virus with other respiratory manifestations: Secondary | ICD-10-CM | POA: Diagnosis not present

## 2016-07-18 DIAGNOSIS — Z79899 Other long term (current) drug therapy: Secondary | ICD-10-CM | POA: Diagnosis not present

## 2016-07-18 DIAGNOSIS — J9811 Atelectasis: Secondary | ICD-10-CM | POA: Diagnosis present

## 2016-07-18 DIAGNOSIS — I110 Hypertensive heart disease with heart failure: Secondary | ICD-10-CM | POA: Diagnosis present

## 2016-07-18 LAB — COMPREHENSIVE METABOLIC PANEL
ALT: 30 U/L (ref 14–54)
AST: 48 U/L — AB (ref 15–41)
Albumin: <1 g/dL — ABNORMAL LOW (ref 3.5–5.0)
Alkaline Phosphatase: 46 U/L (ref 38–126)
Anion gap: 12 (ref 5–15)
BILIRUBIN TOTAL: 0.2 mg/dL — AB (ref 0.3–1.2)
BUN: 32 mg/dL — AB (ref 6–20)
CALCIUM: 6.6 mg/dL — AB (ref 8.9–10.3)
CO2: 21 mmol/L — ABNORMAL LOW (ref 22–32)
Chloride: 99 mmol/L — ABNORMAL LOW (ref 101–111)
Creatinine, Ser: 1.72 mg/dL — ABNORMAL HIGH (ref 0.44–1.00)
GFR calc Af Amer: 34 mL/min — ABNORMAL LOW (ref >60)
GFR, EST NON AFRICAN AMERICAN: 29 mL/min — AB (ref >60)
Glucose, Bld: 121 mg/dL — ABNORMAL HIGH (ref 65–99)
POTASSIUM: 3.3 mmol/L — AB (ref 3.5–5.1)
Sodium: 132 mmol/L — ABNORMAL LOW (ref 135–145)
TOTAL PROTEIN: 3.8 g/dL — AB (ref 6.5–8.1)

## 2016-07-18 LAB — CBC
HEMATOCRIT: 36.8 % (ref 36.0–46.0)
Hemoglobin: 12.6 g/dL (ref 12.0–15.0)
MCH: 31.2 pg (ref 26.0–34.0)
MCHC: 34.2 g/dL (ref 30.0–36.0)
MCV: 91.1 fL (ref 78.0–100.0)
PLATELETS: 206 10*3/uL (ref 150–400)
RBC: 4.04 MIL/uL (ref 3.87–5.11)
RDW: 13.8 % (ref 11.5–15.5)
WBC: 5.1 10*3/uL (ref 4.0–10.5)

## 2016-07-18 LAB — RESPIRATORY PANEL BY PCR
ADENOVIRUS-RVPPCR: NOT DETECTED
BORDETELLA PERTUSSIS-RVPCR: NOT DETECTED
CORONAVIRUS NL63-RVPPCR: NOT DETECTED
Chlamydophila pneumoniae: NOT DETECTED
Coronavirus 229E: NOT DETECTED
Coronavirus HKU1: NOT DETECTED
Coronavirus OC43: NOT DETECTED
Influenza A H3: DETECTED — AB
Influenza B: NOT DETECTED
METAPNEUMOVIRUS-RVPPCR: NOT DETECTED
Mycoplasma pneumoniae: NOT DETECTED
PARAINFLUENZA VIRUS 2-RVPPCR: NOT DETECTED
PARAINFLUENZA VIRUS 3-RVPPCR: NOT DETECTED
Parainfluenza Virus 1: NOT DETECTED
Parainfluenza Virus 4: NOT DETECTED
Respiratory Syncytial Virus: NOT DETECTED
Rhinovirus / Enterovirus: NOT DETECTED

## 2016-07-18 LAB — GLUCOSE, CAPILLARY
GLUCOSE-CAPILLARY: 100 mg/dL — AB (ref 65–99)
GLUCOSE-CAPILLARY: 127 mg/dL — AB (ref 65–99)
GLUCOSE-CAPILLARY: 158 mg/dL — AB (ref 65–99)
Glucose-Capillary: 101 mg/dL — ABNORMAL HIGH (ref 65–99)

## 2016-07-18 LAB — HEMOGLOBIN A1C
HEMOGLOBIN A1C: 7.3 % — AB (ref 4.8–5.6)
Mean Plasma Glucose: 163 mg/dL

## 2016-07-18 LAB — LACTIC ACID, PLASMA: Lactic Acid, Venous: 1 mmol/L (ref 0.5–1.9)

## 2016-07-18 LAB — C DIFFICILE QUICK SCREEN W PCR REFLEX
C DIFFICILE (CDIFF) INTERP: NOT DETECTED
C DIFFICILE (CDIFF) TOXIN: NEGATIVE
C DIFFICLE (CDIFF) ANTIGEN: NEGATIVE

## 2016-07-18 LAB — ECHOCARDIOGRAM COMPLETE
Height: 62 in
WEIGHTICAEL: 3236.8 [oz_av]

## 2016-07-18 IMAGING — DX DG CHEST 2V
2 series · 2 of 2 positions shown · non-contrast
Comparison: [DATE]

CLINICAL DATA: Sepsis, fatigue, shortness of Breath

EXAM:
CHEST  2 VIEW

[chest lat]
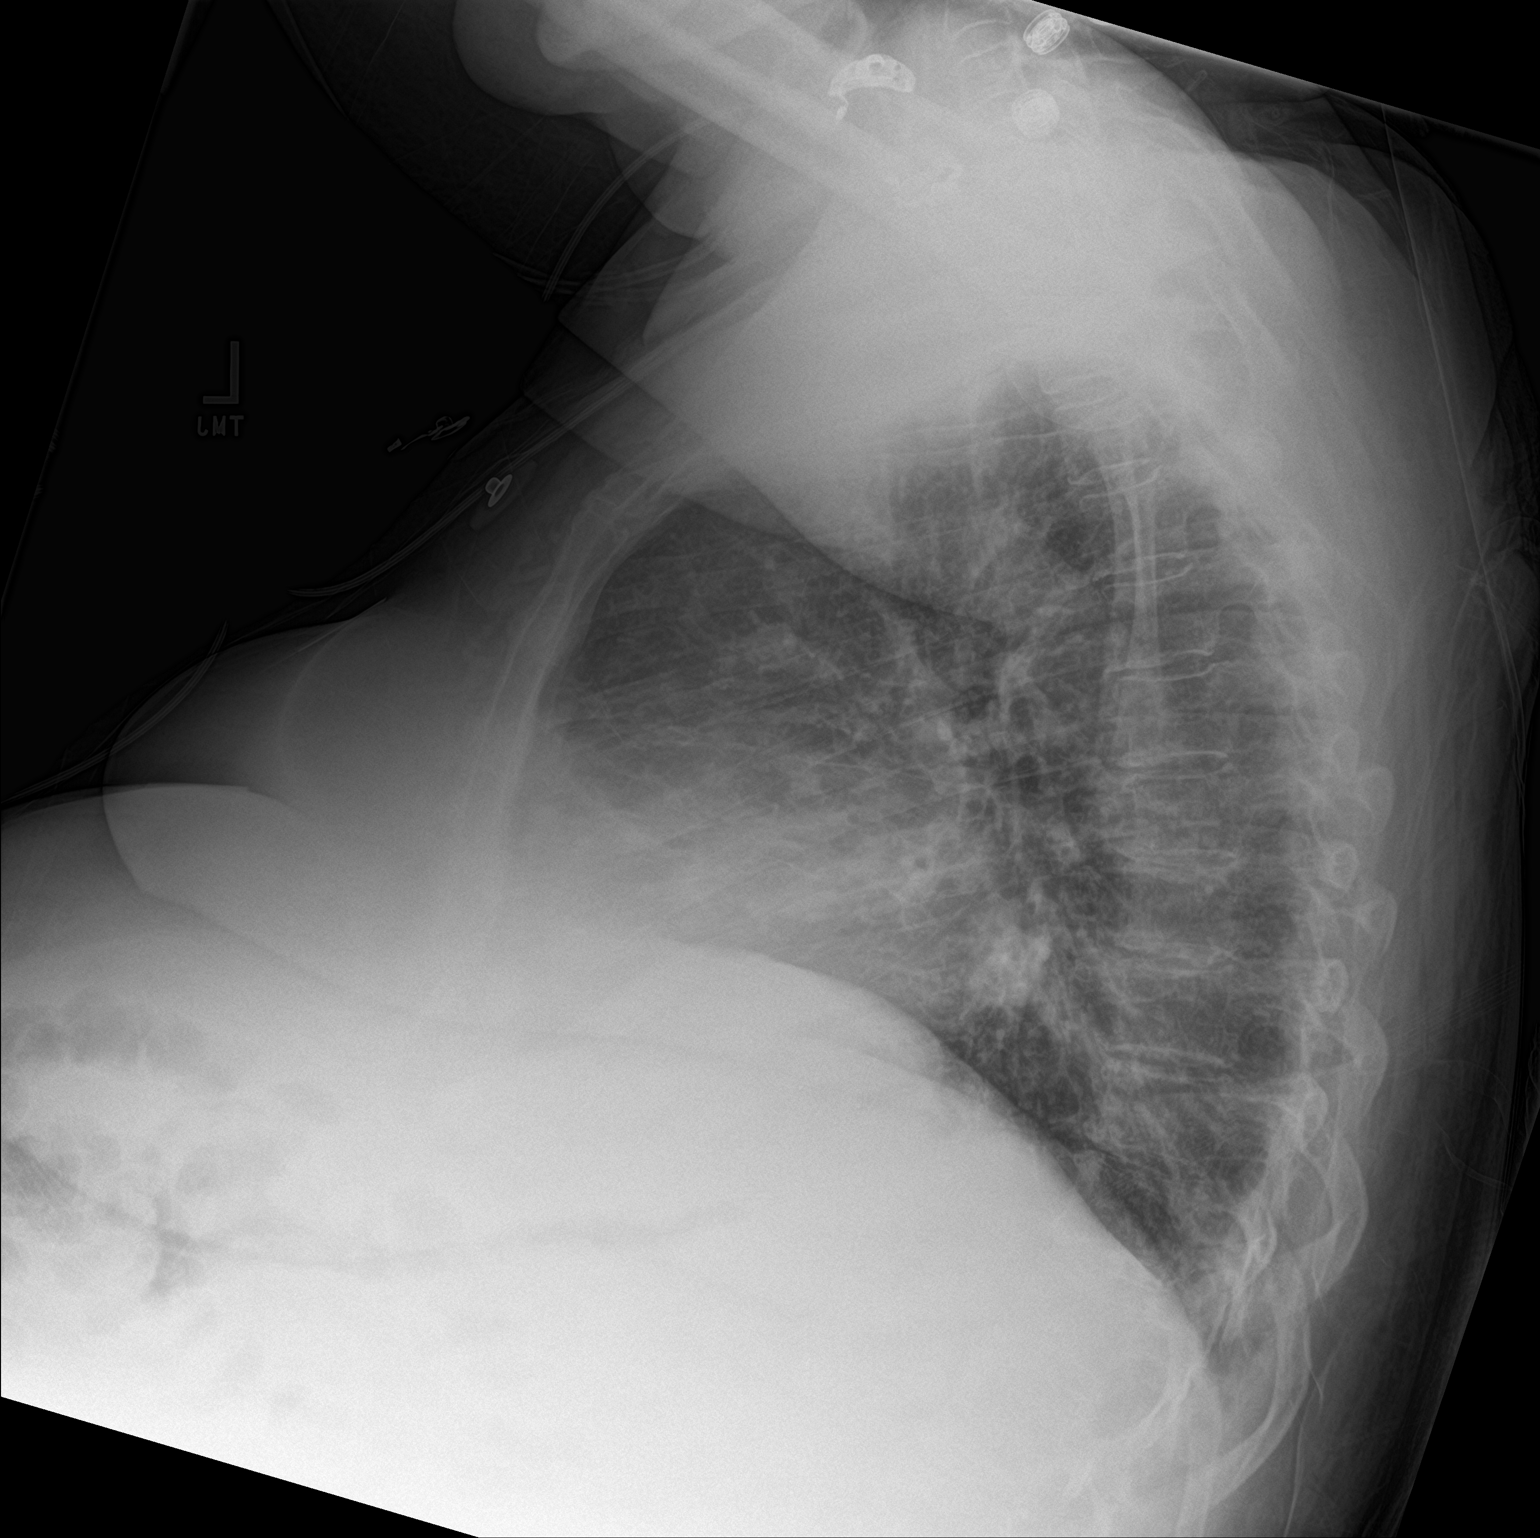

[chest ap]
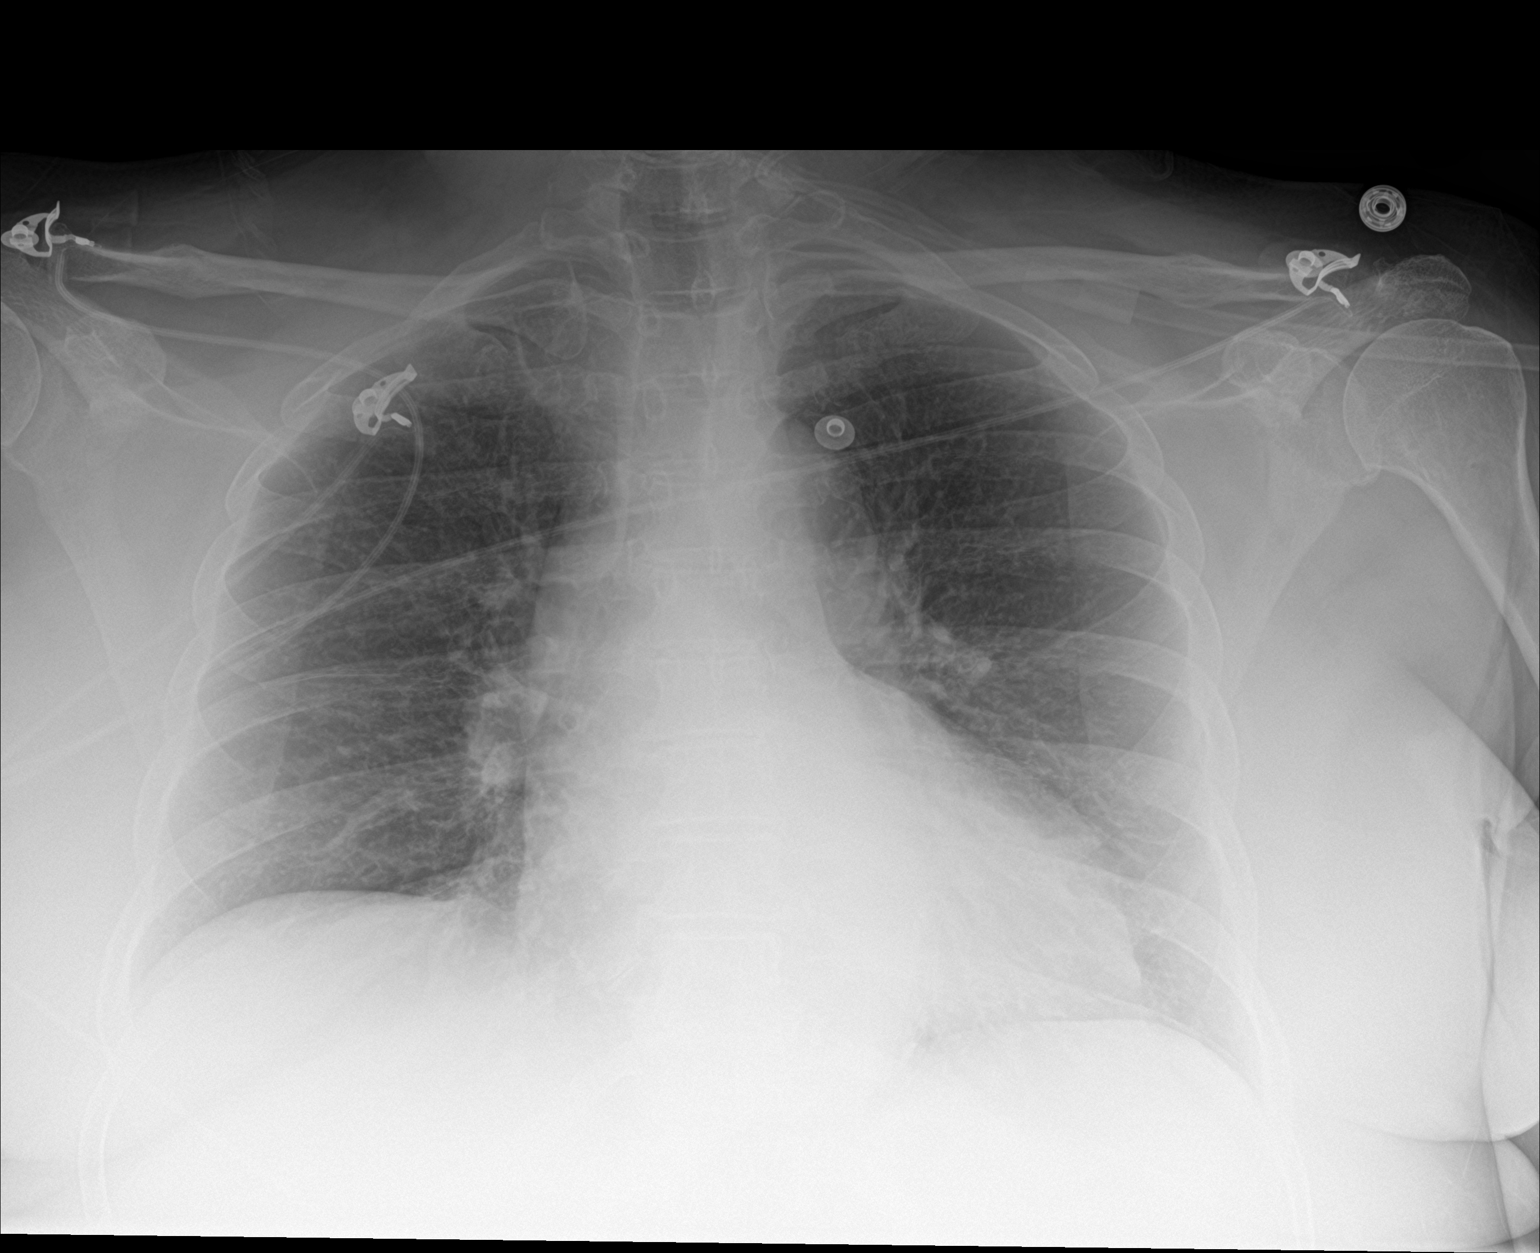

[2 of 2 positions shown; findings below may reference images not displayed]

FINDINGS: Borderline cardiomegaly. Low lung volumes. No focal opacities,
effusions or edema. No acute bony abnormality.
IMPRESSION: Borderline cardiomegaly.  No active disease.

## 2016-07-18 MED ORDER — IPRATROPIUM-ALBUTEROL 0.5-2.5 (3) MG/3ML IN SOLN
3.0000 mL | Freq: Three times a day (TID) | RESPIRATORY_TRACT | Status: DC
  Administered 2016-07-18 – 2016-07-20 (×5): 3 mL via RESPIRATORY_TRACT
  Filled 2016-07-18 (×5): qty 3

## 2016-07-18 MED ORDER — SODIUM CHLORIDE 0.9 % IV BOLUS (SEPSIS)
1000.0000 mL | Freq: Once | INTRAVENOUS | Status: AC
  Administered 2016-07-18: 1000 mL via INTRAVENOUS

## 2016-07-18 MED ORDER — SODIUM CHLORIDE 0.9 % IV SOLN
INTRAVENOUS | Status: DC
  Administered 2016-07-18: 11:00:00 via INTRAVENOUS

## 2016-07-18 MED ORDER — POTASSIUM CHLORIDE CRYS ER 20 MEQ PO TBCR
40.0000 meq | EXTENDED_RELEASE_TABLET | Freq: Four times a day (QID) | ORAL | Status: AC
  Administered 2016-07-18 (×2): 40 meq via ORAL
  Filled 2016-07-18 (×2): qty 2

## 2016-07-18 MED ORDER — SODIUM CHLORIDE 0.9 % IV BOLUS (SEPSIS)
500.0000 mL | Freq: Once | INTRAVENOUS | Status: AC
  Administered 2016-07-18: 500 mL via INTRAVENOUS

## 2016-07-18 MED ORDER — LOPERAMIDE HCL 2 MG PO CAPS
2.0000 mg | ORAL_CAPSULE | ORAL | Status: DC | PRN
  Administered 2016-07-18 – 2016-07-20 (×2): 2 mg via ORAL
  Filled 2016-07-18 (×2): qty 1

## 2016-07-18 MED ORDER — ORAL CARE MOUTH RINSE
15.0000 mL | Freq: Two times a day (BID) | OROMUCOSAL | Status: DC
  Administered 2016-07-18 – 2016-07-19 (×3): 15 mL via OROMUCOSAL

## 2016-07-18 MED ORDER — OSELTAMIVIR PHOSPHATE 30 MG PO CAPS
30.0000 mg | ORAL_CAPSULE | Freq: Two times a day (BID) | ORAL | Status: DC
  Administered 2016-07-18 – 2016-07-20 (×5): 30 mg via ORAL
  Filled 2016-07-18 (×5): qty 1

## 2016-07-18 NOTE — Progress Notes (Signed)
  Echocardiogram 2D Echocardiogram has been performed.  Michaela Santana 07/18/2016, 12:47 PM

## 2016-07-18 NOTE — Progress Notes (Signed)
Paged the doctor about low blood pressure, 86/52, Pulse-80. Normal saline at 250 ml/h for 2 hour and lactic acid ordered. Will continue to monitor.  Aneira Cavitt, RN

## 2016-07-18 NOTE — Progress Notes (Signed)
PROGRESS NOTE                                                                                                                                                                                                             Patient Demographics:    Michaela Santana, is a 71 y.o. female, DOB - March 19, 1946, ZOX:[id number]  Admit date - 07/17/2016   Admitting Physician Waldemar Dickens, MD  Outpatient Primary MD for the patient is Marjorie Smolder, MD  LOS - 0  Chief Complaint  Patient presents with  . Abnormal ECG  . Influenza       Brief Narrative   Michaela Santana is a 71 y.o. female with medical history significant for HTN, DM sent from PCP's office after presenting with flu symptoms, and diagnosed with influenza. She was found to be septic, hypotensive in acute renal failure due to influenza infection.   Subjective:    Michaela Santana today has, No headache, No chest pain, No abdominal pain - No Nausea, No new weakness tingling or numbness, improved Cough - SOB.     Assessment  & Plan :     1.Sepsis with acute renal failure and hypotension due to influenza infection & UTI. Stay on telemetry, IV fluid bolus and maintenance, Tamiflu along with Rocephin, monitor blood cultures, continue supportive care and advance activity gradually.  2. ARF due to #1 above. Check bladder scan and renal ultrasound, hydrated, hold nephrotoxins including ACE inhibitor and diuretic. Repeat BMP in the morning.  3. GERD. On PPI.  4. Morbid obesity. Follow with PCP for weight loss.  5. DM type II. On sliding scale.  CBG (last 3)   Recent Labs  07/17/16 1714 07/17/16 2205 07/18/16 0818  GLUCAP 124* 131* 100*      Diet : Diet heart healthy/carb modified Room service appropriate? Yes; Fluid consistency: Thin    Family Communication  :  Daughters bedside  Code Status :  Full  Disposition Plan  :  Stay in the hospital  Consults  :   None  Procedures  :  None  DVT Prophylaxis  :  Heparin    Lab Results  Component Value Date   PLT 206 07/18/2016    Inpatient Medications  Scheduled Meds: . aspirin  81 mg Oral Daily  . atorvastatin  10 mg Oral Daily  .  cefTRIAXone (ROCEPHIN)  IV  1 g Intravenous Q24H  . heparin  5,000 Units Subcutaneous Q8H  . insulin aspart  0-9 Units Subcutaneous TID WC  . mouth rinse  15 mL Mouth Rinse BID  . oseltamivir  30 mg Oral BID  . pantoprazole  40 mg Oral Daily  . potassium chloride  40 mEq Oral Q6H  . sertraline  75 mg Oral Daily  . sodium chloride  1,000 mL Intravenous Once   Continuous Infusions: . sodium chloride     PRN Meds:.bisacodyl, etodolac, ipratropium-albuterol, magnesium citrate, [DISCONTINUED] ondansetron **OR** ondansetron (ZOFRAN) IV, senna-docusate, tiZANidine  Antibiotics  :    Anti-infectives    Start     Dose/Rate Route Frequency Ordered Stop   07/18/16 1000  oseltamivir (TAMIFLU) capsule 30 mg    Comments:  Tamiflu 30 mg BID for CrCl < 60 mL/min   30 mg Oral 2 times daily 07/18/16 0645 07/22/16 2159   07/18/16 0400  cefTRIAXone (ROCEPHIN) 1 g in dextrose 5 % 50 mL IVPB     1 g 100 mL/hr over 30 Minutes Intravenous Every 24 hours 07/17/16 2118     07/18/16 0230  vancomycin (VANCOCIN) IVPB 750 mg/150 ml premix  Status:  Discontinued     750 mg 150 mL/hr over 60 Minutes Intravenous Every 12 hours 07/17/16 1357 07/17/16 2112   07/17/16 2200  oseltamivir (TAMIFLU) capsule 30 mg  Status:  Discontinued     30 mg Oral 2 times daily 07/17/16 1810 07/18/16 0645   07/17/16 2000  piperacillin-tazobactam (ZOSYN) IVPB 3.375 g  Status:  Discontinued     3.375 g 12.5 mL/hr over 240 Minutes Intravenous Every 8 hours 07/17/16 1357 07/17/16 2112   07/17/16 1400  piperacillin-tazobactam (ZOSYN) IVPB 3.375 g     3.375 g 100 mL/hr over 30 Minutes Intravenous  Once 07/17/16 1351 07/17/16 1435   07/17/16 1400  vancomycin (VANCOCIN) 1,500 mg in sodium chloride 0.9 % 500 mL  IVPB     1,500 mg 250 mL/hr over 120 Minutes Intravenous  Once 07/17/16 1352 07/17/16 1644         Objective:   Vitals:   07/18/16 0325 07/18/16 0544 07/18/16 0617 07/18/16 0630  BP: (!) 91/46 (!) 88/50  (!) 90/55  Pulse: 68 70    Resp: 20 18    Temp: 98.2 F (36.8 C) 98.3 F (36.8 C)    TempSrc: Oral Oral    SpO2: 98% 98%    Weight:   91.8 kg (202 lb 4.8 oz)   Height:        Wt Readings from Last 3 Encounters:  07/18/16 91.8 kg (202 lb 4.8 oz)     Intake/Output Summary (Last 24 hours) at 07/18/16 1044 Last data filed at 07/18/16 0900  Gross per 24 hour  Intake          3636.25 ml  Output              671 ml  Net          2965.25 ml     Physical Exam  Awake Alert, Oriented X 3, No new F.N deficits, Normal affect [place].AT,PERRAL Supple Neck,No JVD, No cervical lymphadenopathy appriciated.  Symmetrical Chest wall movement, Good air movement bilaterally, CTAB RRR,No Gallops,Rubs or new Murmurs, No Parasternal Heave +ve B.Sounds, Abd Soft, No tenderness, No organomegaly appriciated, No rebound - guarding or rigidity. No Cyanosis, Clubbing or edema, No new Rash or bruise       Data Review:  CBC  Recent Labs Lab 07/17/16 1234 07/18/16 0603  WBC 6.3 5.1  HGB 14.9 12.6  HCT 43.1 36.8  PLT 253 206  MCV 90.9 91.1  MCH 31.4 31.2  MCHC 34.6 34.2  RDW 13.5 13.8  LYMPHSABS 0.5*  --   MONOABS 0.7  --   EOSABS 0.0  --   BASOSABS 0.0  --     Chemistries   Recent Labs Lab 07/17/16 1234 07/18/16 0603  NA 133* 132*  K 3.3* 3.3*  CL 97* 99*  CO2 25 21*  GLUCOSE 163* 121*  BUN 27* 32*  CREATININE 1.24* 1.72*  CALCIUM 7.9* 6.6*  AST 64* 48*  ALT 42 30  ALKPHOS 63 46  BILITOT 0.5 0.2*   ------------------------------------------------------------------------------------------------------------------ No results for input(s): CHOL, HDL, LDLCALC, TRIG, CHOLHDL, LDLDIRECT in the last 72 hours.  Lab Results  Component Value Date   HGBA1C 7.3 (H)  07/17/2016   ------------------------------------------------------------------------------------------------------------------  Recent Labs  07/17/16 1644  TSH 0.929   ------------------------------------------------------------------------------------------------------------------ No results for input(s): VITAMINB12, FOLATE, FERRITIN, TIBC, IRON, RETICCTPCT in the last 72 hours.  Coagulation profile  Recent Labs Lab 07/17/16 1234 07/17/16 1645  INR 0.91 0.93    No results for input(s): DDIMER in the last 72 hours.  Cardiac Enzymes No results for input(s): CKMB, TROPONINI, MYOGLOBIN in the last 168 hours.  Invalid input(s): CK ------------------------------------------------------------------------------------------------------------------    Component Value Date/Time   BNP 29.3 07/17/2016 2110    Micro Results Recent Results (from the past 240 hour(s))  Culture, blood (Routine x 2)     Status: None (Preliminary result)   Collection Time: 07/17/16 12:40 PM  Result Value Ref Range Status   Specimen Description BLOOD LEFT ANTECUBITAL  Final   Special Requests BOTTLES DRAWN AEROBIC AND ANAEROBIC 5CC  Final   Culture NO GROWTH <12 HOURS  Final   Report Status PENDING  Incomplete    Radiology Reports Dg Chest 2 View  Result Date: 07/18/2016 CLINICAL DATA:  Sepsis, fatigue, shortness of Breath EXAM: CHEST  2 VIEW COMPARISON:  07/17/2016 FINDINGS: Borderline cardiomegaly. Low lung volumes. No focal opacities, effusions or edema. No acute bony abnormality. IMPRESSION: Borderline cardiomegaly.  No active disease. Electronically Signed   By: Rolm Baptise M.D.   On: 07/18/2016 08:37   Dg Chest 2 View  Result Date: 07/17/2016 CLINICAL DATA:  Productive cough for 3 days. EXAM: CHEST  2 VIEW COMPARISON:  None. FINDINGS: Lung volumes are low. There is some coarsening of the pulmonary interstitium. Mild, streaky opacity right lung base has an appearance most compatible atelectasis.  No pneumothorax, or pleural effusion. No focal bony abnormality. IMPRESSION: Mild, streaky opacity in the right lung base most compatible atelectasis in this low volume chest. Electronically Signed   By: Inge Rise M.D.   On: 07/17/2016 13:05    Time Spent in minutes  30   Boykin Baetz K M.D on 07/18/2016 at 10:44 AM  Between 7am to 7pm - Pager - [phone]  After 7pm go to [email or web address] - password [facility]  [facility] -  Office  [phone]

## 2016-07-18 NOTE — Progress Notes (Signed)
Blood pressure checked manually per MD order. Order stated if less than 90 notify attending. Blood pressure, 90/55. Will continue to monitor.   Elham Fini, RN

## 2016-07-18 NOTE — Progress Notes (Signed)
Pt OOB to chair

## 2016-07-18 NOTE — Progress Notes (Signed)
Lactis acid decreased from 2.0 to 1.0  BP still low 88/50 after NaCl bolus. MD notified. Patient states that she is feeling good this morning. Daughter who is at beside stated the same thing. Will continue to monitor.  Tomica Arseneault, RN

## 2016-07-18 NOTE — Progress Notes (Signed)
Bladder Scan 0 ml

## 2016-07-18 NOTE — Progress Notes (Signed)
Family at the bedside refusing to wear Droplet masks. Education provided.

## 2016-07-18 NOTE — Progress Notes (Signed)
Paged the doctor asking about lactic acid, no lab orders for morning. No response. No new orders. Will continue to monitor.  Baltazar Pekala, RN

## 2016-07-19 DIAGNOSIS — I951 Orthostatic hypotension: Secondary | ICD-10-CM

## 2016-07-19 LAB — BASIC METABOLIC PANEL
Anion gap: 11 (ref 5–15)
BUN: 41 mg/dL — AB (ref 6–20)
CALCIUM: 6.5 mg/dL — AB (ref 8.9–10.3)
CO2: 18 mmol/L — AB (ref 22–32)
CREATININE: 2.03 mg/dL — AB (ref 0.44–1.00)
Chloride: 102 mmol/L (ref 101–111)
GFR calc non Af Amer: 24 mL/min — ABNORMAL LOW (ref >60)
GFR, EST AFRICAN AMERICAN: 28 mL/min — AB (ref >60)
GLUCOSE: 101 mg/dL — AB (ref 65–99)
Potassium: 3.8 mmol/L (ref 3.5–5.1)
Sodium: 131 mmol/L — ABNORMAL LOW (ref 135–145)

## 2016-07-19 LAB — URINALYSIS, ROUTINE W REFLEX MICROSCOPIC
BILIRUBIN URINE: NEGATIVE
Glucose, UA: NEGATIVE mg/dL
Ketones, ur: NEGATIVE mg/dL
LEUKOCYTES UA: NEGATIVE
NITRITE: NEGATIVE
PH: 5 (ref 5.0–8.0)
Protein, ur: >=300 mg/dL — AB
SPECIFIC GRAVITY, URINE: 1.016 (ref 1.005–1.030)

## 2016-07-19 LAB — GLUCOSE, CAPILLARY
GLUCOSE-CAPILLARY: 148 mg/dL — AB (ref 65–99)
Glucose-Capillary: 124 mg/dL — ABNORMAL HIGH (ref 65–99)
Glucose-Capillary: 152 mg/dL — ABNORMAL HIGH (ref 65–99)
Glucose-Capillary: 130 mg/dL — ABNORMAL HIGH (ref 65–99)

## 2016-07-19 LAB — URIC ACID: URIC ACID, SERUM: 7.9 mg/dL — AB (ref 2.3–6.6)

## 2016-07-19 LAB — SODIUM, URINE, RANDOM: Sodium, Ur: <10 mmol/L

## 2016-07-19 LAB — OSMOLALITY, URINE: Osmolality, Ur: 404 mOsm/kg (ref 300–900)

## 2016-07-19 LAB — URINE CULTURE: Culture: <10000 — AB

## 2016-07-19 LAB — OSMOLALITY: Osmolality: 285 mOsm/kg (ref 275–295)

## 2016-07-19 LAB — CREATININE, URINE, RANDOM: CREATININE, URINE: 129.35 mg/dL

## 2016-07-19 MED ORDER — SODIUM CHLORIDE 0.9 % IV SOLN
INTRAVENOUS | Status: DC
  Administered 2016-07-19: 21:00:00 via INTRAVENOUS

## 2016-07-19 MED ORDER — ACETAMINOPHEN 500 MG PO TABS
500.0000 mg | ORAL_TABLET | Freq: Four times a day (QID) | ORAL | Status: DC | PRN
  Administered 2016-07-19: 500 mg via ORAL
  Filled 2016-07-19: qty 1

## 2016-07-19 MED ORDER — TIZANIDINE HCL 4 MG PO TABS: 2.0000 mg | ORAL_TABLET | Freq: Three times a day (TID) | ORAL | Status: DC | PRN

## 2016-07-19 NOTE — Progress Notes (Signed)
PROGRESS NOTE                                                                                                                                                                                                             Patient Demographics:    Michaela Santana, is a 71 y.o. female, DOB - 01/20/1946, NUU:[id number]  Admit date - 07/17/2016   Admitting Physician Waldemar Dickens, MD  Outpatient Primary MD for the patient is Marjorie Smolder, MD  LOS - 1  Chief Complaint  Patient presents with  . Abnormal ECG  . Influenza       Brief Narrative   Michaela Santana is a 71 y.o. female with medical history significant for HTN, DM sent from PCP's office after presenting with flu symptoms, and diagnosed with influenza. She was found to be septic, hypotensive in acute renal failure due to influenza infection.   Subjective:    Michaela Santana today has, No headache, No chest pain, No abdominal pain - No Nausea, No new weakness tingling or numbness, improved Cough - SOB.     Assessment  & Plan :     1.Sepsis with acute renal failure and hypotension due to influenza infection & UTI. Stay on telemetry, IV fluid bolus and maintenance, Tamiflu along with Rocephin, monitor blood cultures, continue supportive care and advance activity gradually.  2. ARF due to #1 above. Renal ultrasound stable, continue to hydrate with IV fluids, hold nephrotoxins including ACE inhibitor and diuretic. Repeat BMP in the morning.  3. GERD. On PPI.  4. Morbid obesity. Follow with PCP for weight loss.  5. Proteinuria noted on UA. Repeat UA as she was toxic at the time of collection, if proteinuria persists outpatient renal follow-up and resume ACE inhibitor once renal function is stable.  6. DM type II. On sliding scale.  CBG (last 3)   Recent Labs  07/18/16 1710 07/18/16 2131 07/19/16 0745  GLUCAP 127* 158* 124*      Diet : Diet heart healthy/carb  modified Room service appropriate? Yes; Fluid consistency: Thin    Family Communication  :  Husband & Daughters bedside  Code Status :  Full  Disposition Plan  :  Stay in the hospital  Consults  :  None  Procedures  :  None  DVT Prophylaxis  :  Heparin  Lab Results  Component Value Date   PLT 206 07/18/2016    Inpatient Medications  Scheduled Meds: . aspirin  81 mg Oral Daily  . atorvastatin  10 mg Oral Daily  . cefTRIAXone (ROCEPHIN)  IV  1 g Intravenous Q24H  . heparin  5,000 Units Subcutaneous Q8H  . insulin aspart  0-9 Units Subcutaneous TID WC  . ipratropium-albuterol  3 mL Nebulization TID  . mouth rinse  15 mL Mouth Rinse BID  . oseltamivir  30 mg Oral BID  . pantoprazole  40 mg Oral Daily  . sertraline  75 mg Oral Daily  . sodium chloride  1,000 mL Intravenous Once   Continuous Infusions: . sodium chloride 75 mL/hr (07/19/16 0830)   PRN Meds:.bisacodyl, ipratropium-albuterol, loperamide, magnesium citrate, [DISCONTINUED] ondansetron **OR** ondansetron (ZOFRAN) IV, senna-docusate, tiZANidine  Antibiotics  :    Anti-infectives    Start     Dose/Rate Route Frequency Ordered Stop   07/18/16 1000  oseltamivir (TAMIFLU) capsule 30 mg    Comments:  Tamiflu 30 mg BID for CrCl < 60 mL/min   30 mg Oral 2 times daily 07/18/16 0645 07/22/16 2159   07/18/16 0400  cefTRIAXone (ROCEPHIN) 1 g in dextrose 5 % 50 mL IVPB     1 g 100 mL/hr over 30 Minutes Intravenous Every 24 hours 07/17/16 2118     07/18/16 0230  vancomycin (VANCOCIN) IVPB 750 mg/150 ml premix  Status:  Discontinued     750 mg 150 mL/hr over 60 Minutes Intravenous Every 12 hours 07/17/16 1357 07/17/16 2112   07/17/16 2200  oseltamivir (TAMIFLU) capsule 30 mg  Status:  Discontinued     30 mg Oral 2 times daily 07/17/16 1810 07/18/16 0645   07/17/16 2000  piperacillin-tazobactam (ZOSYN) IVPB 3.375 g  Status:  Discontinued     3.375 g 12.5 mL/hr over 240 Minutes Intravenous Every 8 hours 07/17/16 1357  07/17/16 2112   07/17/16 1400  piperacillin-tazobactam (ZOSYN) IVPB 3.375 g     3.375 g 100 mL/hr over 30 Minutes Intravenous  Once 07/17/16 1351 07/17/16 1435   07/17/16 1400  vancomycin (VANCOCIN) 1,500 mg in sodium chloride 0.9 % 500 mL IVPB     1,500 mg 250 mL/hr over 120 Minutes Intravenous  Once 07/17/16 1352 07/17/16 1644         Objective:   Vitals:   07/18/16 1931 07/19/16 0031 07/19/16 0519 07/19/16 0724  BP:  (!) 99/55 107/71   Pulse:  72 82   Resp:  20 20   Temp:  98.7 F (37.1 C) 97.8 F (36.6 C)   TempSrc:  Oral Oral   SpO2: 98% 99% 100% 97%  Weight:   93.2 kg (205 lb 8 oz)   Height:        Wt Readings from Last 3 Encounters:  07/19/16 93.2 kg (205 lb 8 oz)     Intake/Output Summary (Last 24 hours) at 07/19/16 1038 Last data filed at 07/19/16 0929  Gross per 24 hour  Intake             1890 ml  Output             1100 ml  Net              790 ml     Physical Exam  Awake Alert, Oriented X 3, No new F.N deficits, Normal affect [place].AT,PERRAL Supple Neck,No JVD, No cervical lymphadenopathy appriciated.  Symmetrical Chest wall movement, Good air movement bilaterally, CTAB  RRR,No Gallops,Rubs or new Murmurs, No Parasternal Heave +ve B.Sounds, Abd Soft, No tenderness, No organomegaly appriciated, No rebound - guarding or rigidity. No Cyanosis, Clubbing or edema, No new Rash or bruise       Data Review:    CBC  Recent Labs Lab 07/17/16 1234 07/18/16 0603  WBC 6.3 5.1  HGB 14.9 12.6  HCT 43.1 36.8  PLT 253 206  MCV 90.9 91.1  MCH 31.4 31.2  MCHC 34.6 34.2  RDW 13.5 13.8  LYMPHSABS 0.5*  --   MONOABS 0.7  --   EOSABS 0.0  --   BASOSABS 0.0  --     Chemistries   Recent Labs Lab 07/17/16 1234 07/18/16 0603 07/19/16 0457  NA 133* 132* 131*  K 3.3* 3.3* 3.8  CL 97* 99* 102  CO2 25 21* 18*  GLUCOSE 163* 121* 101*  BUN 27* 32* 41*  CREATININE 1.24* 1.72* 2.03*  CALCIUM 7.9* 6.6* 6.5*  AST 64* 48*  --   ALT 42 30  --   ALKPHOS 63  46  --   BILITOT 0.5 0.2*  --    ------------------------------------------------------------------------------------------------------------------ No results for input(s): CHOL, HDL, LDLCALC, TRIG, CHOLHDL, LDLDIRECT in the last 72 hours.  Lab Results  Component Value Date   HGBA1C 7.3 (H) 07/17/2016   ------------------------------------------------------------------------------------------------------------------  Recent Labs  07/17/16 1644  TSH 0.929   ------------------------------------------------------------------------------------------------------------------ No results for input(s): VITAMINB12, FOLATE, FERRITIN, TIBC, IRON, RETICCTPCT in the last 72 hours.  Coagulation profile  Recent Labs Lab 07/17/16 1234 07/17/16 1645  INR 0.91 0.93    No results for input(s): DDIMER in the last 72 hours.  Cardiac Enzymes No results for input(s): CKMB, TROPONINI, MYOGLOBIN in the last 168 hours.  Invalid input(s): CK ------------------------------------------------------------------------------------------------------------------    Component Value Date/Time   BNP 29.3 07/17/2016 2110    Micro Results Recent Results (from the past 240 hour(s))  Culture, blood (Routine x 2)     Status: None (Preliminary result)   Collection Time: 07/17/16 12:40 PM  Result Value Ref Range Status   Specimen Description BLOOD LEFT ANTECUBITAL  Final   Special Requests BOTTLES DRAWN AEROBIC AND ANAEROBIC 5CC  Final   Culture NO GROWTH 1 DAY  Final   Report Status PENDING  Incomplete  Respiratory Panel by PCR     Status: Abnormal   Collection Time: 07/17/16  4:15 PM  Result Value Ref Range Status   Adenovirus NOT DETECTED NOT DETECTED Final   Coronavirus 229E NOT DETECTED NOT DETECTED Final   Coronavirus HKU1 NOT DETECTED NOT DETECTED Final   Coronavirus NL63 NOT DETECTED NOT DETECTED Final   Coronavirus OC43 NOT DETECTED NOT DETECTED Final   Metapneumovirus NOT DETECTED NOT DETECTED  Final   Rhinovirus / Enterovirus NOT DETECTED NOT DETECTED Final   Influenza A H3 DETECTED (A) NOT DETECTED Final   Influenza B NOT DETECTED NOT DETECTED Final   Parainfluenza Virus 1 NOT DETECTED NOT DETECTED Final   Parainfluenza Virus 2 NOT DETECTED NOT DETECTED Final   Parainfluenza Virus 3 NOT DETECTED NOT DETECTED Final   Parainfluenza Virus 4 NOT DETECTED NOT DETECTED Final   Respiratory Syncytial Virus NOT DETECTED NOT DETECTED Final   Bordetella pertussis NOT DETECTED NOT DETECTED Final   Chlamydophila pneumoniae NOT DETECTED NOT DETECTED Final   Mycoplasma pneumoniae NOT DETECTED NOT DETECTED Final  Urine culture     Status: Abnormal   Collection Time: 07/17/16  6:17 PM  Result Value Ref Range Status   Specimen  Description URINE, RANDOM  Final   Special Requests NONE  Final   Culture <10,000 COLONIES/mL INSIGNIFICANT GROWTH (A)  Final   Report Status 07/19/2016 FINAL  Final  Culture, blood (Routine x 2)     Status: None (Preliminary result)   Collection Time: 07/17/16  9:10 PM  Result Value Ref Range Status   Specimen Description BLOOD RIGHT ANTECUBITAL  Final   Special Requests IN PEDIATRIC BOTTLE 3CC  Final   Culture NO GROWTH < 24 HOURS  Final   Report Status PENDING  Incomplete  C difficile quick scan w PCR reflex     Status: None   Collection Time: 07/18/16  1:21 PM  Result Value Ref Range Status   C Diff antigen NEGATIVE NEGATIVE Final   C Diff toxin NEGATIVE NEGATIVE Final   C Diff interpretation No C. difficile detected.  Final    Radiology Reports Dg Chest 2 View  Result Date: 07/18/2016 CLINICAL DATA:  Sepsis, fatigue, shortness of Breath EXAM: CHEST  2 VIEW COMPARISON:  07/17/2016 FINDINGS: Borderline cardiomegaly. Low lung volumes. No focal opacities, effusions or edema. No acute bony abnormality. IMPRESSION: Borderline cardiomegaly.  No active disease. Electronically Signed   By: Rolm Baptise M.D.   On: 07/18/2016 08:37   Dg Chest 2 View  Result Date:  07/17/2016 CLINICAL DATA:  Productive cough for 3 days. EXAM: CHEST  2 VIEW COMPARISON:  None. FINDINGS: Lung volumes are low. There is some coarsening of the pulmonary interstitium. Mild, streaky opacity right lung base has an appearance most compatible atelectasis. No pneumothorax, or pleural effusion. No focal bony abnormality. IMPRESSION: Mild, streaky opacity in the right lung base most compatible atelectasis in this low volume chest. Electronically Signed   By: Inge Rise M.D.   On: 07/17/2016 13:05   US Renal  Result Date: 07/18/2016 CLINICAL DATA:  Acute renal failure. EXAM: RENAL / URINARY TRACT ULTRASOUND COMPLETE COMPARISON:  None. FINDINGS: Right Kidney: Length: 14.2 cm, within normal limits. There is mild prominence of the right renal collecting system. Echogenicity within normal limits. No mass visualized. Left Kidney: Length: 13.8 cm, within normal limits. Echogenicity within normal limits. A 1.6 cm benign appearing cyst is present at the lower pole. No mass or hydronephrosis visualized. Bladder: Appears normal for degree of bladder distention. Bilateral ureteral jets are noted. Minimal free fluid is noted adjacent to the liver. IMPRESSION: 1. Mild fullness of the right renal collecting system without significant hydronephrosis. 2. 1.6 cm simple cyst at the lower pole of the left kidney. 3. A small amount of free fluid is present adjacent to the liver. Electronically Signed   By: San Morelle M.D.   On: 07/18/2016 12:17    Time Spent in minutes  30   Jaxyn Rout K M.D on 07/19/2016 at 10:38 AM  Between 7am to 7pm - Pager - [phone]  After 7pm go to [email or web address] - password [facility]  [facility] -  Office  [phone]

## 2016-07-19 NOTE — Progress Notes (Signed)
Pt resting in bed asleep, denied pain at this time, family at bedside.

## 2016-07-20 ENCOUNTER — Other Ambulatory Visit: Payer: Self-pay

## 2016-07-20 DIAGNOSIS — I959 Hypotension, unspecified: Secondary | ICD-10-CM

## 2016-07-20 DIAGNOSIS — R319 Hematuria, unspecified: Secondary | ICD-10-CM

## 2016-07-20 DIAGNOSIS — N390 Urinary tract infection, site not specified: Secondary | ICD-10-CM

## 2016-07-20 DIAGNOSIS — J111 Influenza due to unidentified influenza virus with other respiratory manifestations: Secondary | ICD-10-CM

## 2016-07-20 LAB — BASIC METABOLIC PANEL
ANION GAP: 11 (ref 5–15)
BUN: 43 mg/dL — ABNORMAL HIGH (ref 6–20)
CALCIUM: 6.5 mg/dL — AB (ref 8.9–10.3)
CO2: 15 mmol/L — ABNORMAL LOW (ref 22–32)
CREATININE: 2.12 mg/dL — AB (ref 0.44–1.00)
Chloride: 104 mmol/L (ref 101–111)
GFR, EST AFRICAN AMERICAN: 26 mL/min — AB (ref >60)
GFR, EST NON AFRICAN AMERICAN: 23 mL/min — AB (ref >60)
Glucose, Bld: 110 mg/dL — ABNORMAL HIGH (ref 65–99)
Potassium: 4.4 mmol/L (ref 3.5–5.1)
SODIUM: 130 mmol/L — AB (ref 135–145)

## 2016-07-20 LAB — GLUCOSE, CAPILLARY
GLUCOSE-CAPILLARY: 96 mg/dL (ref 65–99)
GLUCOSE-CAPILLARY: 122 mg/dL — AB (ref 65–99)
GLUCOSE-CAPILLARY: 132 mg/dL — AB (ref 65–99)
GLUCOSE-CAPILLARY: 113 mg/dL — AB (ref 65–99)

## 2016-07-20 MED ORDER — ALBUTEROL SULFATE (2.5 MG/3ML) 0.083% IN NEBU
2.5000 mg | INHALATION_SOLUTION | Freq: Four times a day (QID) | RESPIRATORY_TRACT | Status: DC
  Administered 2016-07-20 (×2): 2.5 mg via RESPIRATORY_TRACT
  Filled 2016-07-20 (×2): qty 3

## 2016-07-20 MED ORDER — OSELTAMIVIR PHOSPHATE 30 MG PO CAPS
30.0000 mg | ORAL_CAPSULE | Freq: Every day | ORAL | Status: DC
  Administered 2016-07-21: 30 mg via ORAL
  Filled 2016-07-20: qty 1

## 2016-07-20 MED ORDER — SODIUM CHLORIDE 0.9 % IV SOLN
INTRAVENOUS | Status: DC
  Administered 2016-07-20 (×2): via INTRAVENOUS

## 2016-07-20 NOTE — Progress Notes (Signed)
Pt is showering, with assistance from family.   Will re-enter IV team order when showering is complete. Per IV team.

## 2016-07-20 NOTE — Progress Notes (Addendum)
Pt slept well during the night, Vitals stable, no any sign of SOB and distress noted, no any complain of pain, IV drip is continue '@75cc'$ /hr, will continue to monitor the patient.

## 2016-07-20 NOTE — Care Management Note (Addendum)
Case Management Note  Patient Details  Name: Michaela Santana MRN: [id number] Date of Birth: [birth year]  Subjective/Objective:     Admitted with CHF               Action/Plan: Patient lives at home with spouse; PCP is Dr Inda Merlin; has private insurance with Medicare / Christella Scheuermann with prescription drug coverage' pharmacy of choice is [facility]; Patient is agreeable to [facility], [facility] choice offered, pt chose [facility]; Hoyle Sauer with [facility] called for arrangements. DME- she has a walker at home and does not want a 3:1 at this time; Attending MD at discharge please enter the face to face in Epic for [facility] services.  Expected Discharge Date:  07/23/16               Expected Discharge Plan:  [place]  In-House Referral:   [facility]   Discharge planning Services  CM Consult  Choice offered to:  Patient  HH Arranged:  RN, Disease Management, PT [place] Agency:  [place]  Status of Service:  In process, will continue to follow  Sherrilyn Rist [phone] 07/20/2016, 2:20 PM

## 2016-07-20 NOTE — Evaluation (Signed)
Physical Therapy Evaluation Patient Details Name: Michaela Santana MRN: [id number] DOB: Oct 26, 1945 Today's Date: 07/20/2016   History of Present Illness  Pt is a 71 y/o female admitted secondary to sepsis, acute renal failure, hypotensive and +Flu. PMH including but not limited to DM and HTN.  Clinical Impression  Pt presented supine in bed with HOB elevated, initially asleep but easily aroused and willing to participate in therapy session. Prior to admission, pt reported that she was independent with all functional mobility with the exception of recent use of a rollator PRN secondary to back pain. Pt ambulated on RA with SPO2 maintaining >90% throughout. Pt very limited secondary to fatigue and required an extended rest break on BSC after ambulating ~20'. Pt would continue to benefit from skilled physical therapy services at this time while admitted and after d/c to address her below listed limitations in order to improve her overall safety and independence with functional mobility.      Follow Up Recommendations Home health PT;Supervision/Assistance - 24 hour    Equipment Recommendations  3in1 (PT)    Recommendations for Other Services       Precautions / Restrictions Precautions Precautions: Fall Restrictions Weight Bearing Restrictions: No      Mobility  Bed Mobility Overal bed mobility: Needs Assistance Bed Mobility: Supine to Sit     Supine to sit: Min guard;HOB elevated     General bed mobility comments: increased time, use of bed rails and min guard for safety  Transfers Overall transfer level: Needs assistance Equipment used: Rolling walker (2 wheeled) Transfers: Sit to/from Stand Sit to Stand: Min guard         General transfer comment: increased time, good hand placement min guard for safety; pt performed sit to stand from bed x1 and from [facility] x2  Ambulation/Gait Ambulation/Gait assistance: Min guard Ambulation Distance (Feet): 20 Feet (20' x2 with prolonged  sitting rest break on BSC) Assistive device: Rolling walker (2 wheeled) Gait Pattern/deviations: Step-through pattern;Decreased step length - right;Decreased step length - left;Decreased stride length;Shuffle;Trunk flexed Gait velocity: decreased Gait velocity interpretation: Below normal speed for age/gender General Gait Details: pt demonstrated safety with use of RW with slow, cautious gait speed and min guard for safety  Stairs            Wheelchair Mobility    Modified Rankin (Stroke Patients Only)       Balance Overall balance assessment: Needs assistance Sitting-balance support: Feet supported Sitting balance-Leahy Scale: Fair     Standing balance support: During functional activity;No upper extremity supported Standing balance-Leahy Scale: Fair                               Pertinent Vitals/Pain Pain Assessment: Faces Faces Pain Scale: Hurts little more Pain Location: generalized Pain Descriptors / Indicators: Sore Pain Intervention(s): Monitored during session;Repositioned    Home Living Family/patient expects to be discharged to:: Private residence Living Arrangements: Spouse/significant other Available Help at Discharge: Family;Available 24 hours/day Type of Home: Other(Comment) (Townhouse) Home Access: Level entry     Home Layout: One level Home Equipment: Walker - 4 wheels      Prior Function Level of Independence: Independent         Comments: pt's daughter reported that pt has used a rollator PRN secondary to back pain     Hand Dominance        Extremity/Trunk Assessment   Upper Extremity Assessment Upper Extremity Assessment:  Generalized weakness    Lower Extremity Assessment Lower Extremity Assessment: Generalized weakness       Communication   Communication: No difficulties  Cognition Arousal/Alertness: Awake/alert Behavior During Therapy: WFL for tasks assessed/performed Overall Cognitive Status: Within  Functional Limits for tasks assessed                      General Comments      Exercises     Assessment/Plan    PT Assessment Patient needs continued PT services  PT Problem List Decreased strength;Decreased activity tolerance;Decreased balance;Decreased mobility;Decreased coordination;Decreased knowledge of use of DME;Decreased safety awareness;Cardiopulmonary status limiting activity          PT Treatment Interventions DME instruction;Gait training;Functional mobility training;Therapeutic activities;Therapeutic exercise;Balance training;Neuromuscular re-education;Patient/family education    PT Goals (Current goals can be found in the Care Plan section)  Acute Rehab PT Goals Patient Stated Goal: return home PT Goal Formulation: With patient/family Time For Goal Achievement: 08/03/16 Potential to Achieve Goals: Good    Frequency Min 3X/week   Barriers to discharge        Co-evaluation               End of Session Equipment Utilized During Treatment: Gait belt Activity Tolerance: Patient limited by fatigue Patient left: in chair;with call bell/phone within reach;with family/visitor present Nurse Communication: Mobility status         Time: 6943-7005 PT Time Calculation (min) (ACUTE ONLY): 23 min   Charges:   PT Evaluation $PT Eval Moderate Complexity: 1 Procedure PT Treatments $Gait Training: 8-22 mins   PT G CodesClearnce Sorrel [name] 07/20/2016, 9:48 AM Sherie Don, PT, DPT [phone]

## 2016-07-20 NOTE — Progress Notes (Signed)
Page to Dr Candiss Norse relaying family/pt's message regarding request for alternate respiratory medication.  "3E01- [place]. RT advised pt to ask for alternate neb rx, due to excessive "jitters" after nebs. please review. thank you."

## 2016-07-20 NOTE — Progress Notes (Signed)
Page to Dr Candiss Norse requesting shower order, per pt request.   "s3e01 Marcello Fennel, pt requests order to shower/temporary dc of tele."  Per Dr Candiss Norse, d/c tele all together.  Call placed to CCMD to notify of telemetry monitoring d/c.   Leaking IV removed, pt cleared for shower.   IV team to place IV access s/p shower so access is not compromised by water.

## 2016-07-20 NOTE — Progress Notes (Signed)
PROGRESS NOTE                                                                                                                                                                                                             Patient Demographics:    Michaela Santana, is a 71 y.o. female, DOB - 11-Nov-1945, IRW:[id number]  Admit date - 07/17/2016   Admitting Physician Waldemar Dickens, MD  Outpatient Primary MD for the patient is Marjorie Smolder, MD  LOS - 2  Chief Complaint  Patient presents with  . Abnormal ECG  . Influenza       Brief Narrative   Michaela Santana is a 71 y.o. female with medical history significant for HTN, DM sent from PCP's office after presenting with flu symptoms, and diagnosed with influenza. She was found to be septic, hypotensive in acute renal failure due to influenza infection.   Subjective:    Jeslyn Amsler today has, No headache, No chest pain, No abdominal pain - No Nausea, No new weakness tingling or numbness, improved Cough - SOB.     Assessment  & Plan :     1.Sepsis with acute renal failure and hypotension due to influenza infection & UTI. She is now much improved with IV fluid bolus and maintenance, Tamiflu along with Rocephin 3 days, negative blood and urine cultures, continue supportive care and advance activity gradually.  2. ARF due to #1 above. Renal ultrasound stable, continue to hydrate with IV fluids, hold nephrotoxins including ACE inhibitor and diuretic. Repeat BMP in the morning.  3. GERD. On PPI.  4. Morbid obesity. Follow with PCP for weight loss.  5. Proteinuria noted on UA. Repeat UA as she was toxic at the time of collection, if proteinuria persists outpatient renal follow-up and resume ACE inhibitor once renal function is stable.  6. Incidental finding of kidney cyst on ultrasound. Follow with PCP.   7. DM type II. On sliding scale.  CBG (last 3)   Recent Labs   07/19/16 1719 07/19/16 2115 07/20/16 0758  GLUCAP 130* 148* 96      Diet : Diet heart healthy/carb modified Room service appropriate? Yes; Fluid consistency: Thin    Family Communication  :  Husband & Daughters bedside  Code Status :  Full  Disposition Plan  :  Discharge in the a.m.  of 07/21/2016 if renal function stable  Consults  :  None  Procedures  :     Renal Ultrasound. Nonacute.  DVT Prophylaxis  :  Heparin    Lab Results  Component Value Date   PLT 206 07/18/2016    Inpatient Medications  Scheduled Meds: . aspirin  81 mg Oral Daily  . atorvastatin  10 mg Oral Daily  . cefTRIAXone (ROCEPHIN)  IV  1 g Intravenous Q24H  . heparin  5,000 Units Subcutaneous Q8H  . insulin aspart  0-9 Units Subcutaneous TID WC  . ipratropium-albuterol  3 mL Nebulization TID  . mouth rinse  15 mL Mouth Rinse BID  . oseltamivir  30 mg Oral BID  . pantoprazole  40 mg Oral Daily  . sertraline  75 mg Oral Daily  . sodium chloride  1,000 mL Intravenous Once   Continuous Infusions: . sodium chloride 125 mL/hr at 07/20/16 0701   PRN Meds:.acetaminophen, bisacodyl, ipratropium-albuterol, loperamide, magnesium citrate, [DISCONTINUED] ondansetron **OR** ondansetron (ZOFRAN) IV, senna-docusate, tiZANidine  Antibiotics  :    Anti-infectives    Start     Dose/Rate Route Frequency Ordered Stop   07/18/16 1000  oseltamivir (TAMIFLU) capsule 30 mg    Comments:  Tamiflu 30 mg BID for CrCl < 60 mL/min   30 mg Oral 2 times daily 07/18/16 0645 07/22/16 2159   07/18/16 0400  cefTRIAXone (ROCEPHIN) 1 g in dextrose 5 % 50 mL IVPB     1 g 100 mL/hr over 30 Minutes Intravenous Every 24 hours 07/17/16 2118     07/18/16 0230  vancomycin (VANCOCIN) IVPB 750 mg/150 ml premix  Status:  Discontinued     750 mg 150 mL/hr over 60 Minutes Intravenous Every 12 hours 07/17/16 1357 07/17/16 2112   07/17/16 2200  oseltamivir (TAMIFLU) capsule 30 mg  Status:  Discontinued     30 mg Oral 2 times daily  07/17/16 1810 07/18/16 0645   07/17/16 2000  piperacillin-tazobactam (ZOSYN) IVPB 3.375 g  Status:  Discontinued     3.375 g 12.5 mL/hr over 240 Minutes Intravenous Every 8 hours 07/17/16 1357 07/17/16 2112   07/17/16 1400  piperacillin-tazobactam (ZOSYN) IVPB 3.375 g     3.375 g 100 mL/hr over 30 Minutes Intravenous  Once 07/17/16 1351 07/17/16 1435   07/17/16 1400  vancomycin (VANCOCIN) 1,500 mg in sodium chloride 0.9 % 500 mL IVPB     1,500 mg 250 mL/hr over 120 Minutes Intravenous  Once 07/17/16 1352 07/17/16 1644         Objective:   Vitals:   07/19/16 1930 07/19/16 2207 07/20/16 0541 07/20/16 0722  BP: 99/68  115/74   Pulse: 82  79   Resp: 18  18   Temp: 98.3 F (36.8 C)  98.6 F (37 C)   TempSrc: Oral  Oral   SpO2: 100% 95% 97% 99%  Weight:   94.9 kg (209 lb 3.2 oz)   Height:        Wt Readings from Last 3 Encounters:  07/20/16 94.9 kg (209 lb 3.2 oz)     Intake/Output Summary (Last 24 hours) at 07/20/16 0929 Last data filed at 07/20/16 0500  Gross per 24 hour  Intake           1707.5 ml  Output              901 ml  Net            806.5 ml     Physical  Exam  Awake Alert, Oriented X 3, No new F.N deficits, Normal affect [place].AT,PERRAL Supple Neck,No JVD, No cervical lymphadenopathy appriciated.  Symmetrical Chest wall movement, Good air movement bilaterally, CTAB RRR,No Gallops,Rubs or new Murmurs, No Parasternal Heave +ve B.Sounds, Abd Soft, No tenderness, No organomegaly appriciated, No rebound - guarding or rigidity. No Cyanosis, Clubbing or edema, No new Rash or bruise       Data Review:    CBC  Recent Labs Lab 07/17/16 1234 07/18/16 0603  WBC 6.3 5.1  HGB 14.9 12.6  HCT 43.1 36.8  PLT 253 206  MCV 90.9 91.1  MCH 31.4 31.2  MCHC 34.6 34.2  RDW 13.5 13.8  LYMPHSABS 0.5*  --   MONOABS 0.7  --   EOSABS 0.0  --   BASOSABS 0.0  --     Chemistries   Recent Labs Lab 07/17/16 1234 07/18/16 0603 07/19/16 0457 07/20/16 0336  NA 133*  132* 131* 130*  K 3.3* 3.3* 3.8 4.4  CL 97* 99* 102 104  CO2 25 21* 18* 15*  GLUCOSE 163* 121* 101* 110*  BUN 27* 32* 41* 43*  CREATININE 1.24* 1.72* 2.03* 2.12*  CALCIUM 7.9* 6.6* 6.5* 6.5*  AST 64* 48*  --   --   ALT 42 30  --   --   ALKPHOS 63 46  --   --   BILITOT 0.5 0.2*  --   --    ------------------------------------------------------------------------------------------------------------------ No results for input(s): CHOL, HDL, LDLCALC, TRIG, CHOLHDL, LDLDIRECT in the last 72 hours.  Lab Results  Component Value Date   HGBA1C 7.3 (H) 07/17/2016   ------------------------------------------------------------------------------------------------------------------  Recent Labs  07/17/16 1644  TSH 0.929   ------------------------------------------------------------------------------------------------------------------ No results for input(s): VITAMINB12, FOLATE, FERRITIN, TIBC, IRON, RETICCTPCT in the last 72 hours.  Coagulation profile  Recent Labs Lab 07/17/16 1234 07/17/16 1645  INR 0.91 0.93    No results for input(s): DDIMER in the last 72 hours.  Cardiac Enzymes No results for input(s): CKMB, TROPONINI, MYOGLOBIN in the last 168 hours.  Invalid input(s): CK ------------------------------------------------------------------------------------------------------------------    Component Value Date/Time   BNP 29.3 07/17/2016 2110    Micro Results Recent Results (from the past 240 hour(s))  Culture, blood (Routine x 2)     Status: None (Preliminary result)   Collection Time: 07/17/16 12:40 PM  Result Value Ref Range Status   Specimen Description BLOOD LEFT ANTECUBITAL  Final   Special Requests BOTTLES DRAWN AEROBIC AND ANAEROBIC 5CC  Final   Culture NO GROWTH 2 DAYS  Final   Report Status PENDING  Incomplete  Respiratory Panel by PCR     Status: Abnormal   Collection Time: 07/17/16  4:15 PM  Result Value Ref Range Status   Adenovirus NOT DETECTED NOT  DETECTED Final   Coronavirus 229E NOT DETECTED NOT DETECTED Final   Coronavirus HKU1 NOT DETECTED NOT DETECTED Final   Coronavirus NL63 NOT DETECTED NOT DETECTED Final   Coronavirus OC43 NOT DETECTED NOT DETECTED Final   Metapneumovirus NOT DETECTED NOT DETECTED Final   Rhinovirus / Enterovirus NOT DETECTED NOT DETECTED Final   Influenza A H3 DETECTED (A) NOT DETECTED Final   Influenza B NOT DETECTED NOT DETECTED Final   Parainfluenza Virus 1 NOT DETECTED NOT DETECTED Final   Parainfluenza Virus 2 NOT DETECTED NOT DETECTED Final   Parainfluenza Virus 3 NOT DETECTED NOT DETECTED Final   Parainfluenza Virus 4 NOT DETECTED NOT DETECTED Final   Respiratory Syncytial Virus NOT DETECTED NOT DETECTED Final  Bordetella pertussis NOT DETECTED NOT DETECTED Final   Chlamydophila pneumoniae NOT DETECTED NOT DETECTED Final   Mycoplasma pneumoniae NOT DETECTED NOT DETECTED Final  Urine culture     Status: Abnormal   Collection Time: 07/17/16  6:17 PM  Result Value Ref Range Status   Specimen Description URINE, RANDOM  Final   Special Requests NONE  Final   Culture <10,000 COLONIES/mL INSIGNIFICANT GROWTH (A)  Final   Report Status 07/19/2016 FINAL  Final  Culture, blood (Routine x 2)     Status: None (Preliminary result)   Collection Time: 07/17/16  9:10 PM  Result Value Ref Range Status   Specimen Description BLOOD RIGHT ANTECUBITAL  Final   Special Requests IN PEDIATRIC BOTTLE 3CC  Final   Culture NO GROWTH 2 DAYS  Final   Report Status PENDING  Incomplete  C difficile quick scan w PCR reflex     Status: None   Collection Time: 07/18/16  1:21 PM  Result Value Ref Range Status   C Diff antigen NEGATIVE NEGATIVE Final   C Diff toxin NEGATIVE NEGATIVE Final   C Diff interpretation No C. difficile detected.  Final    Radiology Reports Dg Chest 2 View  Result Date: 07/18/2016 CLINICAL DATA:  Sepsis, fatigue, shortness of Breath EXAM: CHEST  2 VIEW COMPARISON:  07/17/2016 FINDINGS: Borderline  cardiomegaly. Low lung volumes. No focal opacities, effusions or edema. No acute bony abnormality. IMPRESSION: Borderline cardiomegaly.  No active disease. Electronically Signed   By: Rolm Baptise M.D.   On: 07/18/2016 08:37   Dg Chest 2 View  Result Date: 07/17/2016 CLINICAL DATA:  Productive cough for 3 days. EXAM: CHEST  2 VIEW COMPARISON:  None. FINDINGS: Lung volumes are low. There is some coarsening of the pulmonary interstitium. Mild, streaky opacity right lung base has an appearance most compatible atelectasis. No pneumothorax, or pleural effusion. No focal bony abnormality. IMPRESSION: Mild, streaky opacity in the right lung base most compatible atelectasis in this low volume chest. Electronically Signed   By: Inge Rise M.D.   On: 07/17/2016 13:05   US Renal  Result Date: 07/18/2016 CLINICAL DATA:  Acute renal failure. EXAM: RENAL / URINARY TRACT ULTRASOUND COMPLETE COMPARISON:  None. FINDINGS: Right Kidney: Length: 14.2 cm, within normal limits. There is mild prominence of the right renal collecting system. Echogenicity within normal limits. No mass visualized. Left Kidney: Length: 13.8 cm, within normal limits. Echogenicity within normal limits. A 1.6 cm benign appearing cyst is present at the lower pole. No mass or hydronephrosis visualized. Bladder: Appears normal for degree of bladder distention. Bilateral ureteral jets are noted. Minimal free fluid is noted adjacent to the liver. IMPRESSION: 1. Mild fullness of the right renal collecting system without significant hydronephrosis. 2. 1.6 cm simple cyst at the lower pole of the left kidney. 3. A small amount of free fluid is present adjacent to the liver. Electronically Signed   By: San Morelle M.D.   On: 07/18/2016 12:17    Time Spent in minutes  30   SINGH,PRASHANT K M.D on 07/20/2016 at 9:29 AM  Between 7am to 7pm - Pager - [phone]  After 7pm go to [email or web address] - password [facility]  [facility] -  Office   [phone]

## 2016-07-21 ENCOUNTER — Other Ambulatory Visit: Payer: Self-pay

## 2016-07-21 DIAGNOSIS — I9589 Other hypotension: Secondary | ICD-10-CM

## 2016-07-21 LAB — BASIC METABOLIC PANEL
Anion gap: 10 (ref 5–15)
BUN: 33 mg/dL — AB (ref 6–20)
CALCIUM: 6.5 mg/dL — AB (ref 8.9–10.3)
CO2: 18 mmol/L — ABNORMAL LOW (ref 22–32)
Chloride: 105 mmol/L (ref 101–111)
Creatinine, Ser: 1.97 mg/dL — ABNORMAL HIGH (ref 0.44–1.00)
GFR calc Af Amer: 29 mL/min — ABNORMAL LOW (ref >60)
GFR, EST NON AFRICAN AMERICAN: 25 mL/min — AB (ref >60)
GLUCOSE: 115 mg/dL — AB (ref 65–99)
Potassium: 3.5 mmol/L (ref 3.5–5.1)
Sodium: 133 mmol/L — ABNORMAL LOW (ref 135–145)

## 2016-07-21 LAB — GLUCOSE, CAPILLARY: Glucose-Capillary: 140 mg/dL — ABNORMAL HIGH (ref 65–99)

## 2016-07-21 MED ORDER — FUROSEMIDE 20 MG PO TABS: 20.0000 mg | ORAL_TABLET | Freq: Every day | ORAL | Status: DC

## 2016-07-21 MED ORDER — GLIPIZIDE ER 2.5 MG PO TB24: 2.5000 mg | ORAL_TABLET | Freq: Every day | ORAL | 0 refills | Status: DC

## 2016-07-21 MED ORDER — SALINE SPRAY 0.65 % NA SOLN
1.0000 | NASAL | Status: DC | PRN
  Administered 2016-07-21: 1 via NASAL
  Filled 2016-07-21: qty 44

## 2016-07-21 MED ORDER — OSELTAMIVIR PHOSPHATE 30 MG PO CAPS: 30.0000 mg | ORAL_CAPSULE | Freq: Every day | ORAL | 0 refills | Status: DC

## 2016-07-21 MED ORDER — MAGNESIUM SULFATE IN D5W 1-5 GM/100ML-% IV SOLN
1.0000 g | Freq: Once | INTRAVENOUS | Status: AC
  Administered 2016-07-21: 1 g via INTRAVENOUS
  Filled 2016-07-21: qty 100

## 2016-07-21 MED ORDER — ALBUTEROL SULFATE (2.5 MG/3ML) 0.083% IN NEBU
2.5000 mg | INHALATION_SOLUTION | Freq: Two times a day (BID) | RESPIRATORY_TRACT | Status: DC
  Administered 2016-07-21: 2.5 mg via RESPIRATORY_TRACT
  Filled 2016-07-21: qty 3

## 2016-07-21 MED ORDER — POTASSIUM CHLORIDE CRYS ER 10 MEQ PO TBCR: 20.0000 meq | EXTENDED_RELEASE_TABLET | Freq: Every day | ORAL | Status: DC

## 2016-07-21 MED ORDER — POTASSIUM CHLORIDE CRYS ER 20 MEQ PO TBCR: 40.0000 meq | EXTENDED_RELEASE_TABLET | Freq: Four times a day (QID) | ORAL | Status: DC

## 2016-07-21 MED ORDER — LOPERAMIDE HCL 2 MG PO CAPS: 2.0000 mg | ORAL_CAPSULE | ORAL | 0 refills | Status: DC | PRN

## 2016-07-21 NOTE — Progress Notes (Signed)
Patient alert and oriented, mostly slept during the night. Patient complained that her nose is congested, ordered Ocean nasal spay that was according to the patient effective.  Will continue to monitor.  Aliscia Clayton, RN

## 2016-07-21 NOTE — Progress Notes (Signed)
Pt is c/a/ox3, denies complaints. Her Vitals are within normal limits.

## 2016-07-21 NOTE — Discharge Instructions (Signed)
Follow with Primary MD Marjorie Smolder, MD in -34 days   Get CBC, CMP, 2 view Chest X ray checked  by Primary MD or SNF MD in 3-4 days ( we routinely change or add medications that can affect your baseline labs and fluid status, therefore we recommend that you get the mentioned basic workup next visit with your PCP, your PCP may decide not to get them or add new tests based on their clinical decision)  Activity: As tolerated with Full fall precautions use walker/cane & assistance as needed  Disposition Home    Diet:   Diet heart healthy/carb modified.  Accuchecks 4 times/day, Once in AM empty stomach and then before each meal. Log in all results and show them to your Prim.MD in 3 days. If any glucose reading is under 80 or above 300 call your Prim MD immidiately. Follow Low glucose instructions for glucose under 80 as instructed.   For Heart failure patients - Check your Weight same time everyday, if you gain over 2 pounds, or you develop in leg swelling, experience more shortness of breath or chest pain, call your Primary MD immediately. Follow Cardiac Low Salt Diet and 1.5 lit/day fluid restriction.  On your next visit with your primary care physician please Get Medicines reviewed and adjusted.  Please request your Prim.MD to go over all Hospital Tests and Procedure/Radiological results at the follow up, please get all Hospital records sent to your Prim MD by signing hospital release before you go home.  If you experience worsening of your admission symptoms, develop shortness of breath, life threatening emergency, suicidal or homicidal thoughts you must seek medical attention immediately by calling 911 or calling your MD immediately  if symptoms less severe.  You Must read complete instructions/literature along with all the possible adverse reactions/side effects for all the Medicines you take and that have been prescribed to you. Take any new Medicines after you have completely  understood and accpet all the possible adverse reactions/side effects.   Do not drive, operate heavy machinery, perform activities at heights, swimming or participation in water activities or provide baby sitting services if your were admitted for syncope or siezures until you have seen by Primary MD or a Neurologist and advised to do so again.  Do not drive when taking Pain medications.    Do not take more than prescribed Pain, Sleep and Anxiety Medications  Special Instructions: If you have smoked or chewed Tobacco  in the last 2 yrs please stop smoking, stop any regular Alcohol  and or any Recreational drug use.  Wear Seat belts while driving.   Please note  You were cared for by a hospitalist during your hospital stay. If you have any questions about your discharge medications or the care you received while you were in the hospital after you are discharged, you can call the unit and asked to speak with the hospitalist on call if the hospitalist that took care of you is not available. Once you are discharged, your primary care physician will handle any further medical issues. Please note that NO REFILLS for any discharge medications will be authorized once you are discharged, as it is imperative that you return to your primary care physician (or establish a relationship with a primary care physician if you do not have one) for your aftercare needs so that they can reassess your need for medications and monitor your lab values.

## 2016-07-21 NOTE — Discharge Summary (Signed)
Michaela Santana [id number]:[id number] DOB: 05-15-1946 DOA: 07/17/2016  PCP: Marjorie Smolder, MD  Admit date: 07/17/2016  Discharge date: 07/21/2016  Admitted From: home   Disposition:  home   Recommendations for Outpatient Follow-up:   Follow up with PCP in 1-2 weeks  PCP Please obtain BMP/CBC, 2 view CXR in 1week,  (see Discharge instructions)   PCP Please follow up on the following pending results: Blood pressure and BMP closely, note due to dehydration and acute renal failure multiple blood pressure medications have been stopped or discontinued.   Home Health: None Equipment/Devices:   Consultations: None Discharge Condition: Stable   CODE STATUS: Full   Diet Recommendation: Diet heart healthy/carb modified    Chief Complaint  Patient presents with  . Abnormal ECG  . Influenza     Brief history of present illness from the day of admission and additional interim summary    Michaela Santana a 71 y.o.femalewith medical history significant for HTN, DM sent from PCP's office after presenting with flu symptoms, and diagnosed with influenza. She was found to be septic, hypotensive in acute renal failure due to influenza infection.                                                                 Hospital Course    1.Sepsis with acute renal failure and hypotension due to influenza infection & UTI. She is now much improved with IV fluid bolus and maintenance, Tamiflu along with Rocephin 3 days, negative blood and urine cultures, Improved will be discharged home, complete Tamiflu course and follow with PCP.  2. ARF due to #1 above. Renal ultrasound stable, Stabilized and improved with IV fluids for hydration, diuretics held for another 2 days, ACE inhibitor discontinued for now, Glucophage discontinued, request PCP to  check BMP 3-4 days renal function has trended towards improvement.  3. GERD. On PPI.  4. Morbid obesity. Follow with PCP for weight loss.  5. Proteinuria noted on UA. Ultrasound nonacute except for cyst, outpatient nephrology follow-up per PCP.  6. Incidental finding of kidney cyst on ultrasound. Follow with PCP.   7. DM type II. Due to dehydration and acute renal failure, Glucophage stopped, Glucotrol dose cut in half, requested to check CBGs every before meals at bedtime and follow with PCP in 3-4 days.   Discharge diagnosis     Active Problems:   CHF (congestive heart failure) (HCC)   Sepsis (HCC)   Influenza   Congestive heart failure (HCC)   Hypotension   Urinary tract infection with hematuria    Discharge instructions    Discharge Instructions    Discharge instructions    Complete by:  As directed    Follow with Primary MD Marjorie Smolder, MD in -34 days   Get CBC, CMP, 2 view Chest  X ray checked  by Primary MD or SNF MD in 3-4 days ( we routinely change or add medications that can affect your baseline labs and fluid status, therefore we recommend that you get the mentioned basic workup next visit with your PCP, your PCP may decide not to get them or add new tests based on their clinical decision)  Activity: As tolerated with Full fall precautions use walker/cane & assistance as needed  Disposition Home    Diet:   Diet heart healthy/carb modified.  Accuchecks 4 times/day, Once in AM empty stomach and then before each meal. Log in all results and show them to your Prim.MD in 3 days. If any glucose reading is under 80 or above 300 call your Prim MD immidiately. Follow Low glucose instructions for glucose under 80 as instructed.   For Heart failure patients - Check your Weight same time everyday, if you gain over 2 pounds, or you develop in leg swelling, experience more shortness of breath or chest pain, call your Primary MD immediately. Follow Cardiac Low Salt  Diet and 1.5 lit/day fluid restriction.  On your next visit with your primary care physician please Get Medicines reviewed and adjusted.  Please request your Prim.MD to go over all Hospital Tests and Procedure/Radiological results at the follow up, please get all Hospital records sent to your Prim MD by signing hospital release before you go home.  If you experience worsening of your admission symptoms, develop shortness of breath, life threatening emergency, suicidal or homicidal thoughts you must seek medical attention immediately by calling 911 or calling your MD immediately  if symptoms less severe.  You Must read complete instructions/literature along with all the possible adverse reactions/side effects for all the Medicines you take and that have been prescribed to you. Take any new Medicines after you have completely understood and accpet all the possible adverse reactions/side effects.   Do not drive, operate heavy machinery, perform activities at heights, swimming or participation in water activities or provide baby sitting services if your were admitted for syncope or siezures until you have seen by Primary MD or a Neurologist and advised to do so again.  Do not drive when taking Pain medications.    Do not take more than prescribed Pain, Sleep and Anxiety Medications  Special Instructions: If you have smoked or chewed Tobacco  in the last 2 yrs please stop smoking, stop any regular Alcohol  and or any Recreational drug use.  Wear Seat belts while driving.   Please note  You were cared for by a hospitalist during your hospital stay. If you have any questions about your discharge medications or the care you received while you were in the hospital after you are discharged, you can call the unit and asked to speak with the hospitalist on call if the hospitalist that took care of you is not available. Once you are discharged, your primary care physician will handle any further medical  issues. Please note that NO REFILLS for any discharge medications will be authorized once you are discharged, as it is imperative that you return to your primary care physician (or establish a relationship with a primary care physician if you do not have one) for your aftercare needs so that they can reassess your need for medications and monitor your lab values.   Increase activity slowly    Complete by:  As directed       Discharge Medications   Allergies as of 07/21/2016  Reactions   Codeine Hypertension      Medication List    STOP taking these medications   etodolac 400 MG tablet Commonly known as:  LODINE   ibuprofen 100 MG/5ML suspension Commonly known as:  ADVIL,MOTRIN   lisinopril-hydrochlorothiazide 20-25 MG tablet Commonly known as:  PRINZIDE,ZESTORETIC   metFORMIN 500 MG tablet Commonly known as:  GLUCOPHAGE     TAKE these medications   ALPRAZolam 0.5 MG tablet Commonly known as:  XANAX Take 0.5 mg by mouth See admin instructions. Take 1 tablet by mouth one hour prior to procedure on 07/16/16   amLODipine 10 MG tablet Commonly known as:  NORVASC Take 10 mg by mouth daily.   aspirin 81 MG chewable tablet Chew 81 mg by mouth daily.   atorvastatin 10 MG tablet Commonly known as:  LIPITOR Take 10 mg by mouth daily.   Calcium Carbonate-Vitamin D 600-200 MG-UNIT Tabs Take 1 tablet by mouth 2 (two) times daily.   cetirizine 10 MG chewable tablet Commonly known as:  ZYRTEC Chew 10 mg by mouth daily.   econazole nitrate 1 % cream Apply 1 application topically daily.   fluticasone 50 MCG/ACT nasal spray Commonly known as:  FLONASE Place 2 sprays into both nostrils daily as needed for allergies or rhinitis.   furosemide 20 MG tablet Commonly known as:  LASIX Take 1 tablet (20 mg total) by mouth daily. Start taking on:  07/23/2016   glipiZIDE 2.5 MG 24 hr tablet Commonly known as:  GLUCOTROL XL Take 1 tablet (2.5 mg total) by mouth daily with  breakfast. What changed:  medication strength  how much to take   ipratropium 0.06 % nasal spray Commonly known as:  ATROVENT Place 1 spray into both nostrils daily as needed for allergies.   lansoprazole 30 MG capsule Commonly known as:  PREVACID Take 30 mg by mouth daily at 12 noon.   loperamide 2 MG capsule Commonly known as:  IMODIUM Take 1 capsule (2 mg total) by mouth every 2 (two) hours as needed for diarrhea or loose stools.   magnesium oxide 400 MG tablet Commonly known as:  MAG-OX Take 400-800 mg by mouth See admin instructions. '800mg'$  in am, '400mg'$  in afternoon and in pm   multivitamin tablet Take 1 tablet by mouth daily.   oseltamivir 30 MG capsule Commonly known as:  TAMIFLU Take 1 capsule (30 mg total) by mouth daily.   potassium chloride 10 MEQ tablet Commonly known as:  K-DUR,KLOR-CON Take 2 tablets (20 mEq total) by mouth daily. Start taking on:  07/23/2016   sertraline 50 MG tablet Commonly known as:  ZOLOFT Take 75 mg by mouth daily.   tiZANidine 4 MG tablet Commonly known as:  ZANAFLEX Take 4 mg by mouth 3 (three) times daily as needed for muscle spasms.   Vitamin D (Ergocalciferol) 50000 units Caps capsule Commonly known as:  DRISDOL Take 50,000 Units by mouth every 7 (seven) days.            Durable Medical Equipment        Start     Ordered   07/19/16 [phone]  For home use only DME Nebulizer/meds  Once    Question:  Patient needs a nebulizer to treat with the following condition  Answer:  SOB (shortness of breath)   07/19/16 0827      Follow-up Information    [facility] Follow up.   Specialty:  Home Health Services Why:  They will do your home health  care at your home Contact information: [place] [address] [phone]        Marjorie Smolder, MD. Schedule an appointment as soon as possible for a visit in 4 day(s).   Specialty:  Family Medicine Contact information: [place] [address] [phone]           Major procedures and Radiology Reports - PLEASE review detailed and final reports thoroughly  -        Dg Chest 2 View  Result Date: 07/18/2016 CLINICAL DATA:  Sepsis, fatigue, shortness of Breath EXAM: CHEST  2 VIEW COMPARISON:  07/17/2016 FINDINGS: Borderline cardiomegaly. Low lung volumes. No focal opacities, effusions or edema. No acute bony abnormality. IMPRESSION: Borderline cardiomegaly.  No active disease. Electronically Signed   By: Rolm Baptise M.D.   On: 07/18/2016 08:37   Dg Chest 2 View  Result Date: 07/17/2016 CLINICAL DATA:  Productive cough for 3 days. EXAM: CHEST  2 VIEW COMPARISON:  None. FINDINGS: Lung volumes are low. There is some coarsening of the pulmonary interstitium. Mild, streaky opacity right lung base has an appearance most compatible atelectasis. No pneumothorax, or pleural effusion. No focal bony abnormality. IMPRESSION: Mild, streaky opacity in the right lung base most compatible atelectasis in this low volume chest. Electronically Signed   By: Inge Rise M.D.   On: 07/17/2016 13:05   US Renal  Result Date: 07/18/2016 CLINICAL DATA:  Acute renal failure. EXAM: RENAL / URINARY TRACT ULTRASOUND COMPLETE COMPARISON:  None. FINDINGS: Right Kidney: Length: 14.2 cm, within normal limits. There is mild prominence of the right renal collecting system. Echogenicity within normal limits. No mass visualized. Left Kidney: Length: 13.8 cm, within normal limits. Echogenicity within normal limits. A 1.6 cm benign appearing cyst is present at the lower pole. No mass or hydronephrosis visualized. Bladder: Appears normal for degree of bladder distention. Bilateral ureteral jets are noted. Minimal free fluid is noted adjacent to the liver. IMPRESSION: 1. Mild fullness of the right renal collecting system without significant hydronephrosis. 2. 1.6 cm simple cyst at the lower pole of the left kidney. 3. A small amount of free fluid  is present adjacent to the liver. Electronically Signed   By: San Morelle M.D.   On: 07/18/2016 12:17    Micro Results     Recent Results (from the past 240 hour(s))  Culture, blood (Routine x 2)     Status: None (Preliminary result)   Collection Time: 07/17/16 12:40 PM  Result Value Ref Range Status   Specimen Description BLOOD LEFT ANTECUBITAL  Final   Special Requests BOTTLES DRAWN AEROBIC AND ANAEROBIC 5CC  Final   Culture NO GROWTH 3 DAYS  Final   Report Status PENDING  Incomplete  Respiratory Panel by PCR     Status: Abnormal   Collection Time: 07/17/16  4:15 PM  Result Value Ref Range Status   Adenovirus NOT DETECTED NOT DETECTED Final   Coronavirus 229E NOT DETECTED NOT DETECTED Final   Coronavirus HKU1 NOT DETECTED NOT DETECTED Final   Coronavirus NL63 NOT DETECTED NOT DETECTED Final   Coronavirus OC43 NOT DETECTED NOT DETECTED Final   Metapneumovirus NOT DETECTED NOT DETECTED Final   Rhinovirus / Enterovirus NOT DETECTED NOT DETECTED Final   Influenza A H3 DETECTED (A) NOT DETECTED Final   Influenza B NOT DETECTED NOT DETECTED Final   Parainfluenza Virus 1 NOT DETECTED NOT DETECTED Final   Parainfluenza Virus 2 NOT DETECTED NOT DETECTED  Final   Parainfluenza Virus 3 NOT DETECTED NOT DETECTED Final   Parainfluenza Virus 4 NOT DETECTED NOT DETECTED Final   Respiratory Syncytial Virus NOT DETECTED NOT DETECTED Final   Bordetella pertussis NOT DETECTED NOT DETECTED Final   Chlamydophila pneumoniae NOT DETECTED NOT DETECTED Final   Mycoplasma pneumoniae NOT DETECTED NOT DETECTED Final  Urine culture     Status: Abnormal   Collection Time: 07/17/16  6:17 PM  Result Value Ref Range Status   Specimen Description URINE, RANDOM  Final   Special Requests NONE  Final   Culture <10,000 COLONIES/mL INSIGNIFICANT GROWTH (A)  Final   Report Status 07/19/2016 FINAL  Final  Culture, blood (Routine x 2)     Status: None (Preliminary result)   Collection Time: 07/17/16  9:10  PM  Result Value Ref Range Status   Specimen Description BLOOD RIGHT ANTECUBITAL  Final   Special Requests IN PEDIATRIC BOTTLE 3CC  Final   Culture NO GROWTH 3 DAYS  Final   Report Status PENDING  Incomplete  C difficile quick scan w PCR reflex     Status: None   Collection Time: 07/18/16  1:21 PM  Result Value Ref Range Status   C Diff antigen NEGATIVE NEGATIVE Final   C Diff toxin NEGATIVE NEGATIVE Final   C Diff interpretation No C. difficile detected.  Final    Today   Subjective    Michaela Santana today has no headache,no chest abdominal pain,no new weakness tingling or numbness, feels much better wants to go home today.    Objective   Blood pressure 113/89, pulse 72, temperature 97.7 F (36.5 C), temperature source Oral, resp. rate 20, height '5\' 2"'$  (1.575 m), weight 96.8 kg (213 lb 4.8 oz), SpO2 99 %.   Intake/Output Summary (Last 24 hours) at 07/21/16 0942 Last data filed at 07/21/16 6546  Gross per 24 hour  Intake             3370 ml  Output             1701 ml  Net             1669 ml    Exam Awake Alert, Oriented x 3, No new F.N deficits, Normal affect .AT,PERRAL Supple Neck,No JVD, No cervical lymphadenopathy appriciated.  Symmetrical Chest wall movement, Good air movement bilaterally, CTAB RRR,No Gallops,Rubs or new Murmurs, No Parasternal Heave +ve B.Sounds, Abd Soft, Non tender, No organomegaly appriciated, No rebound -guarding or rigidity. No Cyanosis, Clubbing or edema, No new Rash or bruise   Data Review   CBC w Diff:  Lab Results  Component Value Date   WBC 5.1 07/18/2016   HGB 12.6 07/18/2016   HCT 36.8 07/18/2016   PLT 206 07/18/2016   LYMPHOPCT 8 07/17/2016   MONOPCT 11 07/17/2016   EOSPCT 0 07/17/2016   BASOPCT 0 07/17/2016    CMP:  Lab Results  Component Value Date   NA 133 (L) 07/21/2016   K 3.5 07/21/2016   CL 105 07/21/2016   CO2 18 (L) 07/21/2016   BUN 33 (H) 07/21/2016   CREATININE 1.97 (H) 07/21/2016   PROT 3.8 (L)  07/18/2016   ALBUMIN <1.0 (L) 07/18/2016   BILITOT 0.2 (L) 07/18/2016   ALKPHOS 46 07/18/2016   AST 48 (H) 07/18/2016   ALT 30 07/18/2016  .   Total Time in preparing paper work, data evaluation and todays exam - 35 minutes  Lala Lund K M.D on 07/21/2016 at 9:42 AM  [facility]   Office  [phone]

## 2016-07-21 NOTE — Progress Notes (Signed)
Paged the doctor about EKG order that was ordered every 6 hours. Order was d/c. New order placed for daily EKG. Will continue to monitor.  Zuriel Roskos, RN

## 2016-07-22 LAB — CULTURE, BLOOD (ROUTINE X 2)
CULTURE: NO GROWTH
Culture: NO GROWTH

## 2016-07-23 LAB — GLUCOSE, CAPILLARY: GLUCOSE-CAPILLARY: 108 mg/dL — AB (ref 65–99)

## 2016-07-24 DIAGNOSIS — I509 Heart failure, unspecified: Secondary | ICD-10-CM | POA: Diagnosis not present

## 2016-07-24 DIAGNOSIS — I110 Hypertensive heart disease with heart failure: Secondary | ICD-10-CM | POA: Diagnosis not present

## 2016-07-24 DIAGNOSIS — E119 Type 2 diabetes mellitus without complications: Secondary | ICD-10-CM | POA: Diagnosis not present

## 2016-07-24 DIAGNOSIS — F329 Major depressive disorder, single episode, unspecified: Secondary | ICD-10-CM | POA: Diagnosis not present

## 2016-07-24 DIAGNOSIS — Z6839 Body mass index (BMI) 39.0-39.9, adult: Secondary | ICD-10-CM | POA: Diagnosis not present

## 2016-07-24 DIAGNOSIS — Z87440 Personal history of urinary (tract) infections: Secondary | ICD-10-CM | POA: Diagnosis not present

## 2016-07-25 DIAGNOSIS — Z6839 Body mass index (BMI) 39.0-39.9, adult: Secondary | ICD-10-CM | POA: Diagnosis not present

## 2016-07-25 DIAGNOSIS — F329 Major depressive disorder, single episode, unspecified: Secondary | ICD-10-CM | POA: Diagnosis not present

## 2016-07-25 DIAGNOSIS — E119 Type 2 diabetes mellitus without complications: Secondary | ICD-10-CM | POA: Diagnosis not present

## 2016-07-25 DIAGNOSIS — I110 Hypertensive heart disease with heart failure: Secondary | ICD-10-CM | POA: Diagnosis not present

## 2016-07-25 DIAGNOSIS — Z87440 Personal history of urinary (tract) infections: Secondary | ICD-10-CM | POA: Diagnosis not present

## 2016-07-25 DIAGNOSIS — I509 Heart failure, unspecified: Secondary | ICD-10-CM | POA: Diagnosis not present

## 2016-07-26 ENCOUNTER — Ambulatory Visit
Admission: RE | Admit: 2016-07-26 | Discharge: 2016-07-26 | Disposition: A | Discharge status: Home / Self Care | Payer: Medicare Other | Source: Ambulatory Visit | Attending: Family Medicine | Admitting: Family Medicine

## 2016-07-26 ENCOUNTER — Other Ambulatory Visit: Payer: Self-pay | Admitting: Family Medicine

## 2016-07-26 DIAGNOSIS — R938 Abnormal findings on diagnostic imaging of other specified body structures: Secondary | ICD-10-CM | POA: Diagnosis not present

## 2016-07-26 DIAGNOSIS — I10 Essential (primary) hypertension: Secondary | ICD-10-CM | POA: Diagnosis not present

## 2016-07-26 DIAGNOSIS — Z09 Encounter for follow-up examination after completed treatment for conditions other than malignant neoplasm: Secondary | ICD-10-CM

## 2016-07-26 DIAGNOSIS — J90 Pleural effusion, not elsewhere classified: Secondary | ICD-10-CM | POA: Diagnosis not present

## 2016-07-26 DIAGNOSIS — E7800 Pure hypercholesterolemia, unspecified: Secondary | ICD-10-CM | POA: Diagnosis not present

## 2016-07-26 DIAGNOSIS — H9190 Unspecified hearing loss, unspecified ear: Secondary | ICD-10-CM | POA: Diagnosis not present

## 2016-07-26 DIAGNOSIS — E119 Type 2 diabetes mellitus without complications: Secondary | ICD-10-CM | POA: Diagnosis not present

## 2016-07-26 DIAGNOSIS — Z7984 Long term (current) use of oral hypoglycemic drugs: Secondary | ICD-10-CM | POA: Diagnosis not present

## 2016-07-26 DIAGNOSIS — J181 Lobar pneumonia, unspecified organism: Secondary | ICD-10-CM | POA: Diagnosis not present

## 2016-07-26 IMAGING — CR DG CHEST 2V
2 series · 2 of 2 positions shown · non-contrast
Comparison: [DATE].

CLINICAL DATA: Infiltrate.

EXAM:
CHEST  2 VIEW

[w chest pa]
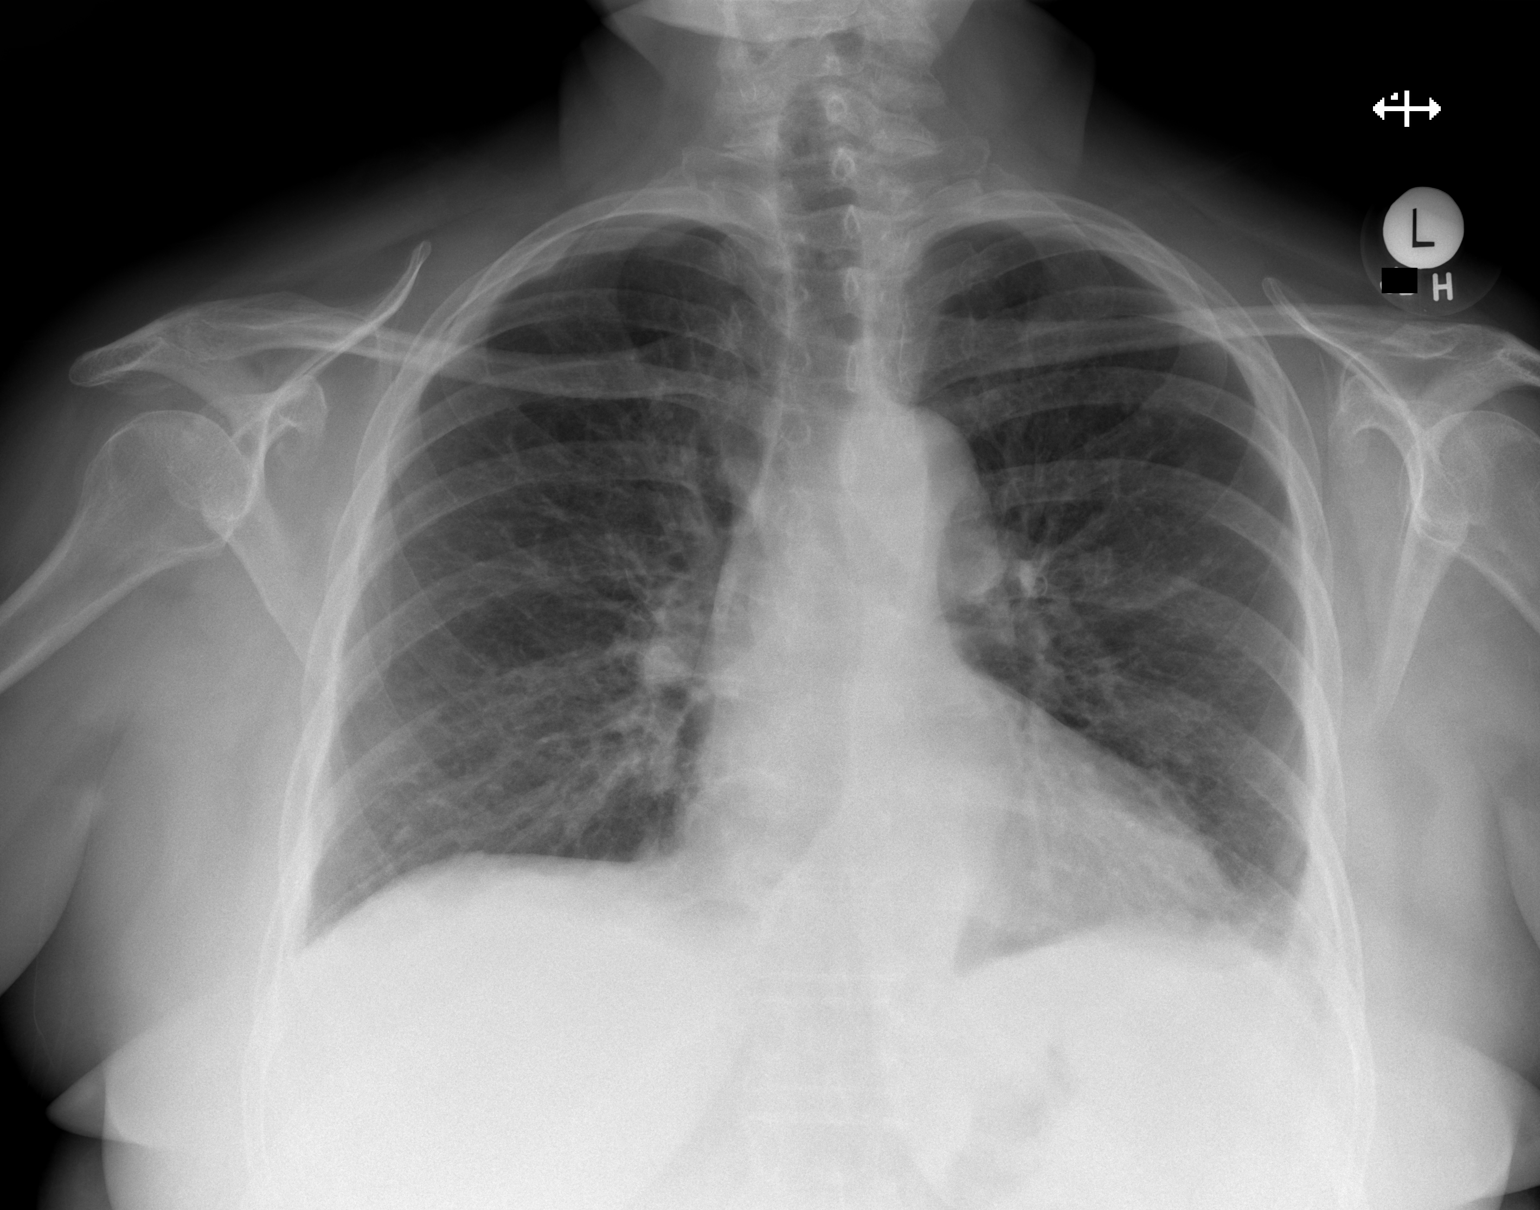

[w chest lat]
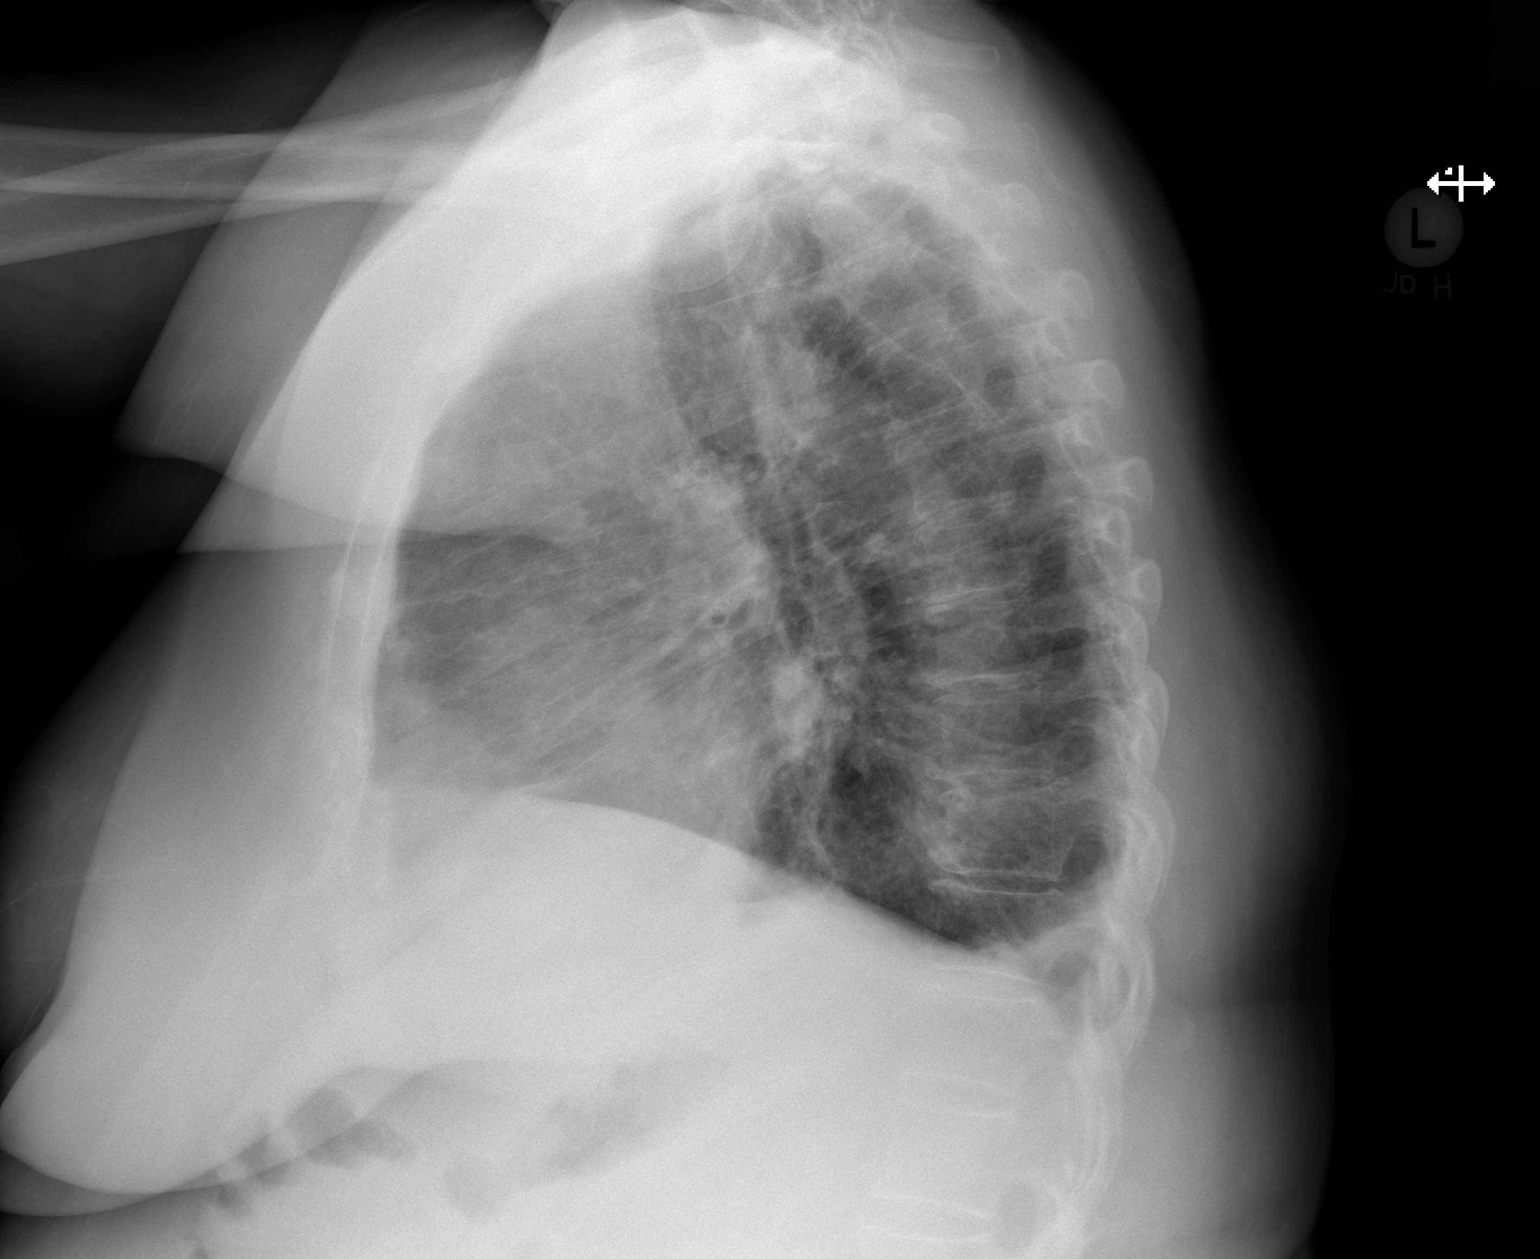

[2 of 2 positions shown; findings below may reference images not displayed]

FINDINGS: Mediastinum and hilar structures normal. Left lower lobe infiltrate
consistent pneumonia. Small left pleural effusion. Heart size
normal. No acute bony abnormality .
IMPRESSION: Left lower lobe infiltrate consistent pneumonia. Small left pleural
effusion.

## 2016-07-27 DIAGNOSIS — I509 Heart failure, unspecified: Secondary | ICD-10-CM | POA: Diagnosis not present

## 2016-07-27 DIAGNOSIS — F329 Major depressive disorder, single episode, unspecified: Secondary | ICD-10-CM | POA: Diagnosis not present

## 2016-07-27 DIAGNOSIS — Z87440 Personal history of urinary (tract) infections: Secondary | ICD-10-CM | POA: Diagnosis not present

## 2016-07-27 DIAGNOSIS — Z6839 Body mass index (BMI) 39.0-39.9, adult: Secondary | ICD-10-CM | POA: Diagnosis not present

## 2016-07-27 DIAGNOSIS — E119 Type 2 diabetes mellitus without complications: Secondary | ICD-10-CM | POA: Diagnosis not present

## 2016-07-27 DIAGNOSIS — I110 Hypertensive heart disease with heart failure: Secondary | ICD-10-CM | POA: Diagnosis not present

## 2016-07-30 DIAGNOSIS — Z87440 Personal history of urinary (tract) infections: Secondary | ICD-10-CM | POA: Diagnosis not present

## 2016-07-30 DIAGNOSIS — I110 Hypertensive heart disease with heart failure: Secondary | ICD-10-CM | POA: Diagnosis not present

## 2016-07-30 DIAGNOSIS — Z6839 Body mass index (BMI) 39.0-39.9, adult: Secondary | ICD-10-CM | POA: Diagnosis not present

## 2016-07-30 DIAGNOSIS — I509 Heart failure, unspecified: Secondary | ICD-10-CM | POA: Diagnosis not present

## 2016-07-30 DIAGNOSIS — F329 Major depressive disorder, single episode, unspecified: Secondary | ICD-10-CM | POA: Diagnosis not present

## 2016-07-30 DIAGNOSIS — E119 Type 2 diabetes mellitus without complications: Secondary | ICD-10-CM | POA: Diagnosis not present

## 2016-07-31 DIAGNOSIS — F329 Major depressive disorder, single episode, unspecified: Secondary | ICD-10-CM | POA: Diagnosis not present

## 2016-07-31 DIAGNOSIS — E119 Type 2 diabetes mellitus without complications: Secondary | ICD-10-CM | POA: Diagnosis not present

## 2016-07-31 DIAGNOSIS — Z87440 Personal history of urinary (tract) infections: Secondary | ICD-10-CM | POA: Diagnosis not present

## 2016-07-31 DIAGNOSIS — Z6839 Body mass index (BMI) 39.0-39.9, adult: Secondary | ICD-10-CM | POA: Diagnosis not present

## 2016-07-31 DIAGNOSIS — I110 Hypertensive heart disease with heart failure: Secondary | ICD-10-CM | POA: Diagnosis not present

## 2016-07-31 DIAGNOSIS — I509 Heart failure, unspecified: Secondary | ICD-10-CM | POA: Diagnosis not present

## 2016-08-02 DIAGNOSIS — I10 Essential (primary) hypertension: Secondary | ICD-10-CM | POA: Diagnosis not present

## 2016-08-02 DIAGNOSIS — R609 Edema, unspecified: Secondary | ICD-10-CM | POA: Diagnosis not present

## 2016-08-02 DIAGNOSIS — E119 Type 2 diabetes mellitus without complications: Secondary | ICD-10-CM | POA: Diagnosis not present

## 2016-08-02 DIAGNOSIS — Z7984 Long term (current) use of oral hypoglycemic drugs: Secondary | ICD-10-CM | POA: Diagnosis not present

## 2016-08-02 DIAGNOSIS — J181 Lobar pneumonia, unspecified organism: Secondary | ICD-10-CM | POA: Diagnosis not present

## 2016-08-03 DIAGNOSIS — E119 Type 2 diabetes mellitus without complications: Secondary | ICD-10-CM | POA: Diagnosis not present

## 2016-08-03 DIAGNOSIS — I110 Hypertensive heart disease with heart failure: Secondary | ICD-10-CM | POA: Diagnosis not present

## 2016-08-03 DIAGNOSIS — F329 Major depressive disorder, single episode, unspecified: Secondary | ICD-10-CM | POA: Diagnosis not present

## 2016-08-03 DIAGNOSIS — I509 Heart failure, unspecified: Secondary | ICD-10-CM | POA: Diagnosis not present

## 2016-08-03 DIAGNOSIS — Z87440 Personal history of urinary (tract) infections: Secondary | ICD-10-CM | POA: Diagnosis not present

## 2016-08-03 DIAGNOSIS — Z6839 Body mass index (BMI) 39.0-39.9, adult: Secondary | ICD-10-CM | POA: Diagnosis not present

## 2016-08-06 DIAGNOSIS — E119 Type 2 diabetes mellitus without complications: Secondary | ICD-10-CM | POA: Diagnosis not present

## 2016-08-06 DIAGNOSIS — Z6839 Body mass index (BMI) 39.0-39.9, adult: Secondary | ICD-10-CM | POA: Diagnosis not present

## 2016-08-06 DIAGNOSIS — F329 Major depressive disorder, single episode, unspecified: Secondary | ICD-10-CM | POA: Diagnosis not present

## 2016-08-06 DIAGNOSIS — Z87440 Personal history of urinary (tract) infections: Secondary | ICD-10-CM | POA: Diagnosis not present

## 2016-08-06 DIAGNOSIS — I110 Hypertensive heart disease with heart failure: Secondary | ICD-10-CM | POA: Diagnosis not present

## 2016-08-06 DIAGNOSIS — I509 Heart failure, unspecified: Secondary | ICD-10-CM | POA: Diagnosis not present

## 2016-08-07 DIAGNOSIS — I509 Heart failure, unspecified: Secondary | ICD-10-CM | POA: Diagnosis not present

## 2016-08-07 DIAGNOSIS — Z87440 Personal history of urinary (tract) infections: Secondary | ICD-10-CM | POA: Diagnosis not present

## 2016-08-07 DIAGNOSIS — E119 Type 2 diabetes mellitus without complications: Secondary | ICD-10-CM | POA: Diagnosis not present

## 2016-08-07 DIAGNOSIS — F329 Major depressive disorder, single episode, unspecified: Secondary | ICD-10-CM | POA: Diagnosis not present

## 2016-08-07 DIAGNOSIS — Z6839 Body mass index (BMI) 39.0-39.9, adult: Secondary | ICD-10-CM | POA: Diagnosis not present

## 2016-08-07 DIAGNOSIS — I110 Hypertensive heart disease with heart failure: Secondary | ICD-10-CM | POA: Diagnosis not present

## 2016-08-09 DIAGNOSIS — I509 Heart failure, unspecified: Secondary | ICD-10-CM | POA: Diagnosis not present

## 2016-08-09 DIAGNOSIS — F329 Major depressive disorder, single episode, unspecified: Secondary | ICD-10-CM | POA: Diagnosis not present

## 2016-08-09 DIAGNOSIS — Z87440 Personal history of urinary (tract) infections: Secondary | ICD-10-CM | POA: Diagnosis not present

## 2016-08-09 DIAGNOSIS — Z6839 Body mass index (BMI) 39.0-39.9, adult: Secondary | ICD-10-CM | POA: Diagnosis not present

## 2016-08-09 DIAGNOSIS — I110 Hypertensive heart disease with heart failure: Secondary | ICD-10-CM | POA: Diagnosis not present

## 2016-08-09 DIAGNOSIS — E119 Type 2 diabetes mellitus without complications: Secondary | ICD-10-CM | POA: Diagnosis not present

## 2016-08-13 DIAGNOSIS — Z7984 Long term (current) use of oral hypoglycemic drugs: Secondary | ICD-10-CM | POA: Diagnosis not present

## 2016-08-13 DIAGNOSIS — E559 Vitamin D deficiency, unspecified: Secondary | ICD-10-CM | POA: Diagnosis not present

## 2016-08-13 DIAGNOSIS — R7303 Prediabetes: Secondary | ICD-10-CM | POA: Diagnosis not present

## 2016-08-13 DIAGNOSIS — K219 Gastro-esophageal reflux disease without esophagitis: Secondary | ICD-10-CM | POA: Diagnosis not present

## 2016-08-13 DIAGNOSIS — R509 Fever, unspecified: Secondary | ICD-10-CM | POA: Diagnosis not present

## 2016-08-13 DIAGNOSIS — M545 Low back pain: Secondary | ICD-10-CM | POA: Diagnosis not present

## 2016-08-13 DIAGNOSIS — E7800 Pure hypercholesterolemia, unspecified: Secondary | ICD-10-CM | POA: Diagnosis not present

## 2016-08-13 DIAGNOSIS — N390 Urinary tract infection, site not specified: Secondary | ICD-10-CM | POA: Diagnosis not present

## 2016-08-13 DIAGNOSIS — I10 Essential (primary) hypertension: Secondary | ICD-10-CM | POA: Diagnosis not present

## 2016-08-13 DIAGNOSIS — E119 Type 2 diabetes mellitus without complications: Secondary | ICD-10-CM | POA: Diagnosis not present

## 2016-08-13 DIAGNOSIS — J181 Lobar pneumonia, unspecified organism: Secondary | ICD-10-CM | POA: Diagnosis not present

## 2016-08-13 DIAGNOSIS — E669 Obesity, unspecified: Secondary | ICD-10-CM | POA: Diagnosis not present

## 2016-08-14 ENCOUNTER — Ambulatory Visit
Admission: RE | Admit: 2016-08-14 | Discharge: 2016-08-14 | Disposition: A | Discharge status: Home / Self Care | Payer: Medicare Other | Source: Ambulatory Visit | Attending: Family Medicine | Admitting: Family Medicine

## 2016-08-14 ENCOUNTER — Other Ambulatory Visit: Payer: Self-pay | Admitting: Family Medicine

## 2016-08-14 DIAGNOSIS — J181 Lobar pneumonia, unspecified organism: Secondary | ICD-10-CM | POA: Diagnosis not present

## 2016-08-14 DIAGNOSIS — Z09 Encounter for follow-up examination after completed treatment for conditions other than malignant neoplasm: Secondary | ICD-10-CM

## 2016-08-14 IMAGING — CR DG CHEST 2V
2 series · 2 of 2 positions shown · non-contrast
Comparison: Chest x-ray of [DATE] and [DATE]

CLINICAL DATA: Followup left lower lobe pneumonia

EXAM:
CHEST  2 VIEW

[w chest pa]
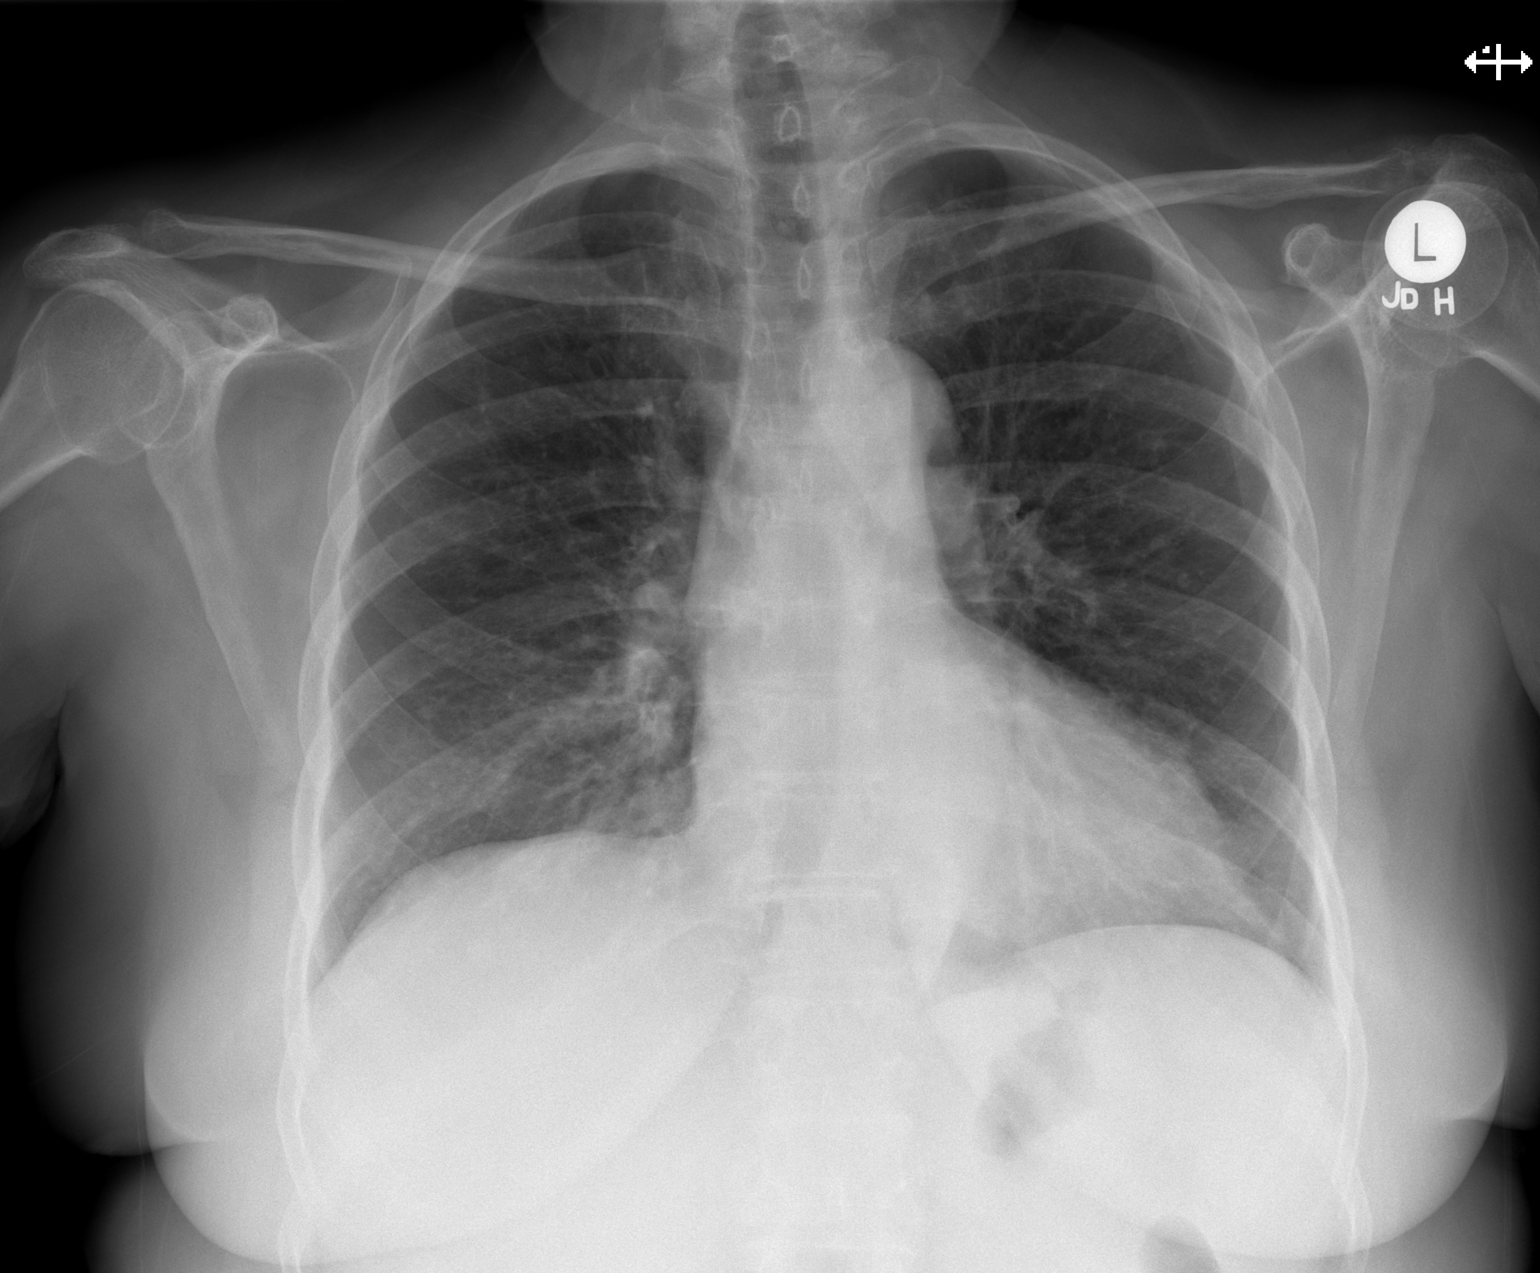

[w chest lat]
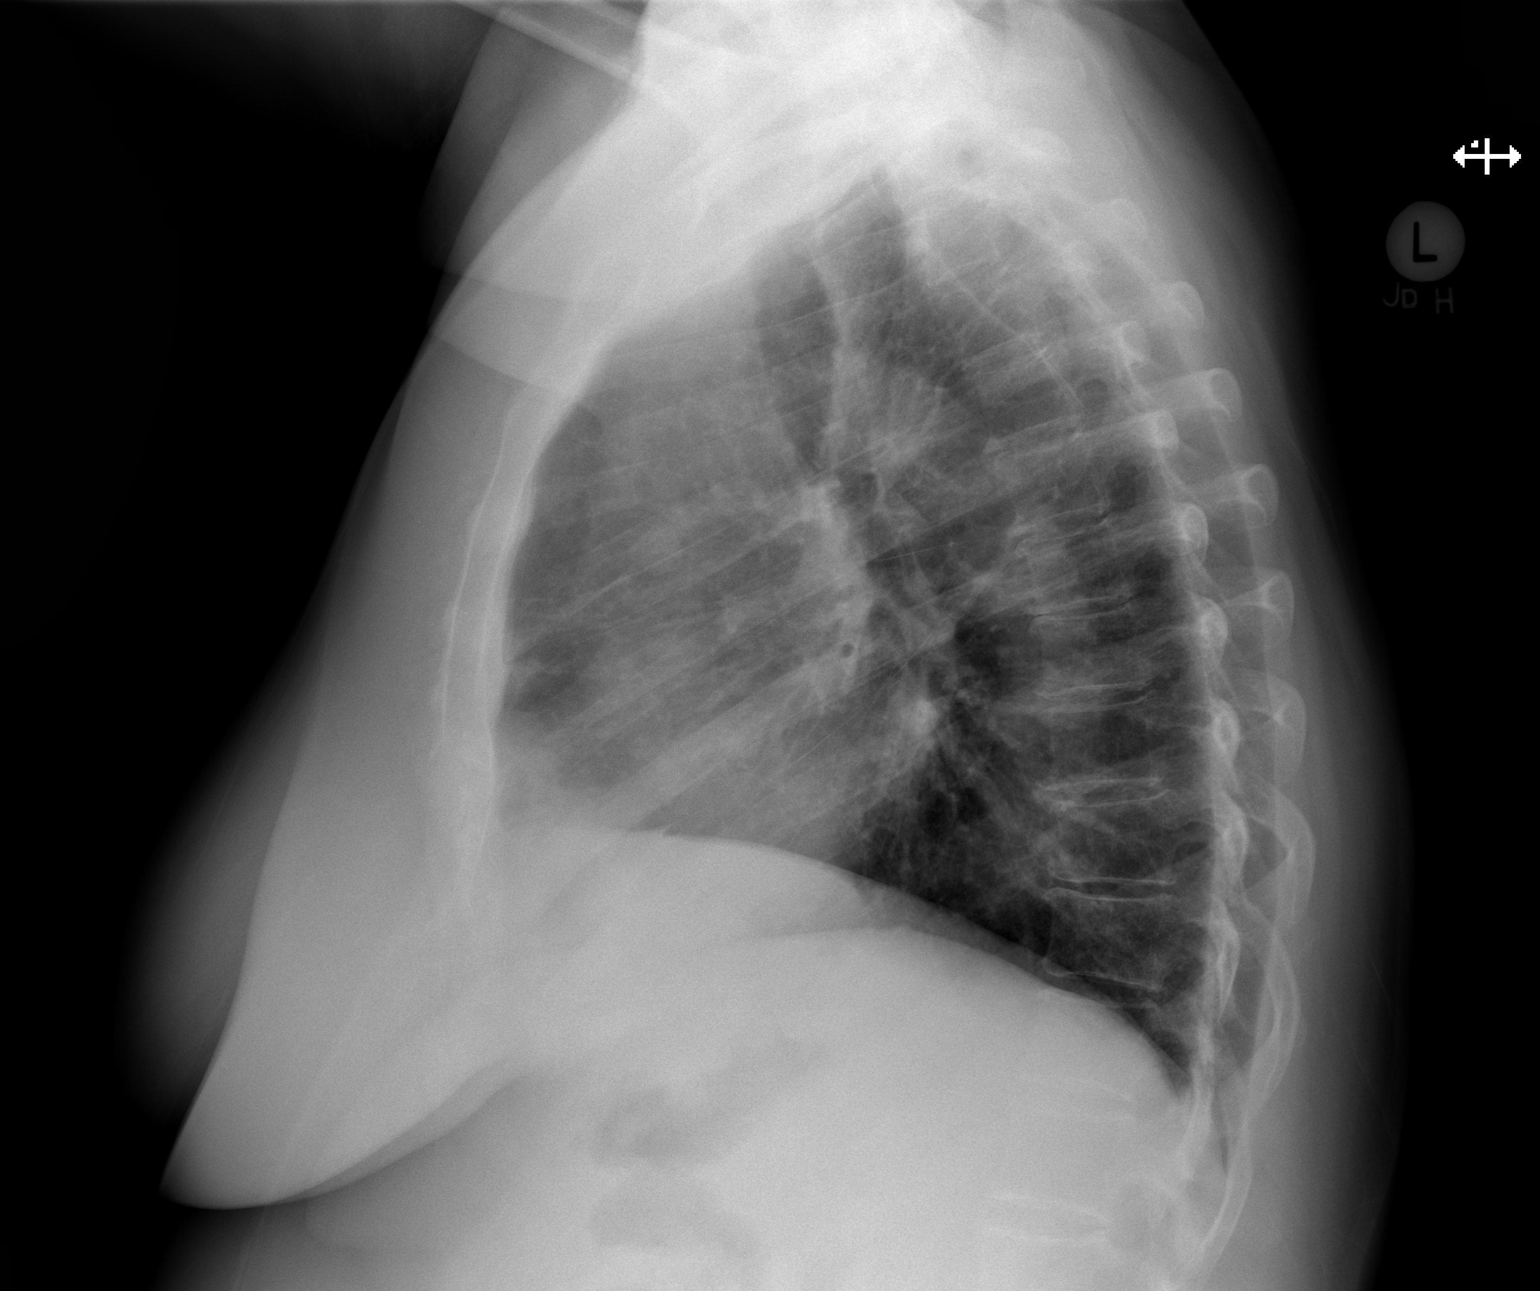

[2 of 2 positions shown; findings below may reference images not displayed]

FINDINGS: The slightly prominent markings noted previously at the left lung
base have cleared. No definite pneumonia or effusion is seen. There
is some peribronchial thickening which may indicate bronchitis.
Mediastinal and hilar contours are unremarkable. The heart is
borderline enlarged and stable. There are mild degenerative changes
in the thoracic spine.
IMPRESSION: No pneumonia or effusion. Question of bronchitis.

## 2016-08-15 DIAGNOSIS — E119 Type 2 diabetes mellitus without complications: Secondary | ICD-10-CM | POA: Diagnosis not present

## 2016-08-15 DIAGNOSIS — Z6839 Body mass index (BMI) 39.0-39.9, adult: Secondary | ICD-10-CM | POA: Diagnosis not present

## 2016-08-15 DIAGNOSIS — I509 Heart failure, unspecified: Secondary | ICD-10-CM | POA: Diagnosis not present

## 2016-08-15 DIAGNOSIS — I110 Hypertensive heart disease with heart failure: Secondary | ICD-10-CM | POA: Diagnosis not present

## 2016-08-15 DIAGNOSIS — Z87440 Personal history of urinary (tract) infections: Secondary | ICD-10-CM | POA: Diagnosis not present

## 2016-08-15 DIAGNOSIS — F329 Major depressive disorder, single episode, unspecified: Secondary | ICD-10-CM | POA: Diagnosis not present

## 2016-08-28 DIAGNOSIS — E871 Hypo-osmolality and hyponatremia: Secondary | ICD-10-CM | POA: Diagnosis not present

## 2016-09-05 ENCOUNTER — Telehealth (INDEPENDENT_AMBULATORY_CARE_PROVIDER_SITE_OTHER): Payer: Self-pay | Admitting: Orthopaedic Surgery

## 2016-09-05 ENCOUNTER — Telehealth (INDEPENDENT_AMBULATORY_CARE_PROVIDER_SITE_OTHER): Payer: Self-pay | Admitting: *Deleted

## 2016-09-05 ENCOUNTER — Ambulatory Visit (INDEPENDENT_AMBULATORY_CARE_PROVIDER_SITE_OTHER): Payer: Medicare Other | Admitting: Orthopaedic Surgery

## 2016-09-05 DIAGNOSIS — M5442 Lumbago with sciatica, left side: Secondary | ICD-10-CM

## 2016-09-05 DIAGNOSIS — G8929 Other chronic pain: Secondary | ICD-10-CM

## 2016-09-05 DIAGNOSIS — M5441 Lumbago with sciatica, right side: Secondary | ICD-10-CM

## 2016-09-05 NOTE — Progress Notes (Signed)
Office Visit Note   Patient: Michaela Santana           Date of Birth: [birth year]           MRN: [id number] Visit Date: 09/05/2016              Requested by: Darcus Austin, MD [address] [place], [place] [address] PCP: Marjorie Smolder, MD   Assessment & Plan: Visit Diagnoses:  1. Chronic bilateral low back pain with bilateral sciatica     Plan: I'm going to send her to physical therapy for her low back as well as have Dr. Ernestina Patches see her for hopefully bilateral facet joint injections which I think are likely at multiple levels of lower aspect lumbar spine. He may eventually need epidural injections due to the findings of her MRI. She'll continue her pain medications and anti-inflammatory medications as needed. Should anterior wall and walker. We'll see her back or cells 4 weeks and then intraoperative to physical therapy for back and had an encounter with Dr. Ernestina Patches  Follow-Up Instructions: Return in about 4 weeks (around 10/03/2016).   Orders:  No orders of the defined types were placed in this encounter.  No orders of the defined types were placed in this encounter.     Procedures: No procedures performed   Clinical Data: No additional findings.   Subjective: No chief complaint on file. The patient is very pleasant 71 year old female I'm seeing for the first time as a patient. I seemed hours of her before so they requested me. She him most with a rolling walker due to severe low back pain is been getting worse for several years now. She gets pain radiating down both legs but mainly her right leg more than her left. She was just recently in the hospital for other issues which may be related to her heart. She has seen a chiropractor before but no formal physical therapy. She takes tramadol. She just denies change in bowel or bladder function. She denies any specific weakness in her legs but this report significant pain in general. He can be a burning type of pain  but also sometimes a stabbing type of pain. She points to the lower aspect of her lumbar spine across the spine is biggest source of her pain. She says she gets spasms of occasion as well.  HPI  Review of Systems She currently denies any chest pain, headache, shortness of breath, fever, chills, nausea, vomiting  Objective: Vital Signs: There were no vitals taken for this visit.  Physical Exam She is alert and oriented 3 and in no acute distress. She is and living with a rolling walker. Ortho Exam She gets up slowly from a seated position. She has pain in the midline and the facet areas of her lumbar spine with flexion extension and flexion extension does cause significant pain. There is pain to palpation along these joints as well. She has negative straight leg raise bilaterally. She has good strength in both of her feet and normal sensation in both legs. Her reflexes are equal as well. Specialty Comments:  No specialty comments available.  Imaging: No results found. 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 She has moderate stenosis at these levels as well.  PMFS History: Patient Active Problem List   Diagnosis Date Noted  . CHF (congestive heart failure) ([facility]) 07/17/2016  . Sepsis ([place] [facility]) 07/17/2016  . Influenza  07/17/2016  . Congestive heart failure ([place])   . Hypotension   . Urinary tract infection with hematuria    Past Medical History:  Diagnosis Date  . Anemia   . 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 CHF (congestive heart failure) ([facility]) 07/17/2016  . Depression   . Family history of adverse reaction to anesthesia    "her father had some issues when he had his gallbladder taken out when he was in his late 68s"  . GERD (gastroesophageal reflux disease)   . High cholesterol   . History of kidney stones   . Hypertension   . Type II diabetes mellitus ([place])     No family  history on file.  Past Surgical History:  Procedure Laterality Date  . BREAST BIOPSY     "? side; it wasn't anything"  . CHOLECYSTECTOMY OPEN     Social History   Occupational History  . Not on file.   Social History Main Topics  . Smoking status: Never Smoker  . Smokeless tobacco: Never Used  . Alcohol use No  . Drug use: No  . Sexual activity: Not on file

## 2016-09-05 NOTE — Addendum Note (Signed)
Addended by: Jacklyn Shell on: 09/05/2016 09:49 AM   Modules accepted: Orders

## 2016-09-05 NOTE — Telephone Encounter (Signed)
I gave them a prescription for [facility]

## 2016-09-05 NOTE — Telephone Encounter (Signed)
Patient's daughter called requesting refill of her mother's pain medication tramadol.  Patient's PCP stated that since she was seen by Dr. Ninfa Linden today that he would be the one to refill it.  CB#[phone].  Thank you.

## 2016-09-05 NOTE — Telephone Encounter (Signed)
Please advise 

## 2016-09-05 NOTE — Telephone Encounter (Signed)
Patient's daughter [name] called in this morning in regards to her mother's referral to Physical therapy. She called the office where Dr. Ninfa Linden referred her to and they stated that the only Michaela Santana they had was a yoga instructor? Jill[facility] CB # [phone]. Thank you

## 2016-09-05 NOTE — Telephone Encounter (Signed)
Ok to refill her tramdol.  50 mg, 1-2 po every 8-12 hours as needed, #60

## 2016-09-06 NOTE — Telephone Encounter (Signed)
Called into her pharmacy

## 2016-09-06 NOTE — Telephone Encounter (Signed)
LMOM for patient that we still want her to go to [facility], they can just take that Rx to them and they will schedule

## 2016-09-12 DIAGNOSIS — M6281 Muscle weakness (generalized): Secondary | ICD-10-CM | POA: Diagnosis not present

## 2016-09-12 DIAGNOSIS — R262 Difficulty in walking, not elsewhere classified: Secondary | ICD-10-CM | POA: Diagnosis not present

## 2016-09-12 DIAGNOSIS — M545 Low back pain: Secondary | ICD-10-CM | POA: Diagnosis not present

## 2016-09-17 ENCOUNTER — Encounter (INDEPENDENT_AMBULATORY_CARE_PROVIDER_SITE_OTHER): Payer: Self-pay | Admitting: Physical Medicine and Rehabilitation

## 2016-09-17 ENCOUNTER — Ambulatory Visit (INDEPENDENT_AMBULATORY_CARE_PROVIDER_SITE_OTHER): Payer: Self-pay

## 2016-09-17 ENCOUNTER — Ambulatory Visit (INDEPENDENT_AMBULATORY_CARE_PROVIDER_SITE_OTHER): Payer: Medicare Other | Admitting: Physical Medicine and Rehabilitation

## 2016-09-17 VITALS — BP 119/64 | HR 73 | Temp 98.1°F

## 2016-09-17 DIAGNOSIS — M47816 Spondylosis without myelopathy or radiculopathy, lumbar region: Secondary | ICD-10-CM

## 2016-09-17 MED ORDER — METHYLPREDNISOLONE ACETATE 80 MG/ML IJ SUSP: 80.0000 mg | Freq: Once | INTRAMUSCULAR | Status: DC

## 2016-09-17 MED ORDER — LIDOCAINE HCL (PF) 1 % IJ SOLN: 0.3300 mL | Freq: Once | INTRAMUSCULAR | Status: DC

## 2016-09-17 NOTE — Patient Instructions (Signed)

## 2016-09-18 DIAGNOSIS — M6281 Muscle weakness (generalized): Secondary | ICD-10-CM | POA: Diagnosis not present

## 2016-09-18 DIAGNOSIS — R262 Difficulty in walking, not elsewhere classified: Secondary | ICD-10-CM | POA: Diagnosis not present

## 2016-09-18 DIAGNOSIS — M545 Low back pain: Secondary | ICD-10-CM | POA: Diagnosis not present

## 2016-09-19 NOTE — Progress Notes (Signed)
Michaela Santana - 71 y.o. female MRN [id number]  Date of birth: [birth year]  Office Visit Note: Visit Date: 09/17/2016 PCP: Marjorie Smolder, MD Referred by: Darcus Austin, MD  Subjective: Chief Complaint  Patient presents with  . Lower Back - Pain   HPI: Michaela Santana is a 71 year old female with chronic worsening severe axial low back pain mostly at the lumbosacral junction with some referral of the posterior lateral legs in the hamstrings. No paresthesias or radiating symptoms past the knee. MRI shows mostly severe facet arthropathy particularly at L4-5 and L5-S1. She does not have any significant central canal stenosis but does have some foraminal narrowing and lateral recess narrowing. Depending on her relief I would suggest epidural injection.    ROS Otherwise per HPI.  Assessment & Plan: Visit Diagnoses:  1. Spondylosis without myelopathy or radiculopathy, lumbar region     Plan: Findings:  Bilateral L5-S1 facet joint blocks diagnostically and hopefully therapeutically.    Meds & Orders:  Meds ordered this encounter  Medications  . lidocaine (PF) (XYLOCAINE) 1 % injection 0.3 mL  . methylPREDNISolone acetate (DEPO-MEDROL) injection 80 mg    Orders Placed This Encounter  Procedures  . Facet Injection  . XR C-ARM NO REPORT    Follow-up: Return if symptoms worsen or fail to improve, for Dr. Ninfa Linden.   Procedures: No procedures performed  Lumbar Facet Joint Intra-Articular Injection(s) with Fluoroscopic Guidance  Patient: Michaela Santana      Date of Birth: [birth year] MRN: [id number] PCP: Marjorie Smolder, MD      Visit Date: 09/17/2016   Universal Protocol:    Date/Time: 04/11/186:15 AM  Consent Given By: the patient  Position: PRONE   Additional Comments: Vital signs were monitored before and after the procedure. Patient was prepped and draped in the usual sterile fashion. The correct patient, procedure, and site was verified.   Injection Procedure Details:    Procedure Site One Meds Administered:  Meds ordered this encounter  Medications  . lidocaine (PF) (XYLOCAINE) 1 % injection 0.3 mL  . methylPREDNISolone acetate (DEPO-MEDROL) injection 80 mg     Laterality: Bilateral  Location/Site:  L5-S1  Needle size: 22 guage  Needle type: Spinal  Needle Placement: Articular  Findings:  -Contrast Used: 1 mL iohexol 180 mg iodine/mL   -Comments: Excellent flow of contrast producing a partial arthrogram.  Procedure Details: The fluoroscope beam is vertically oriented in AP, and the inferior recess is visualized beneath the lower pole of the inferior apophyseal process, which represents the target point for needle insertion. When direct visualization is difficult the target point is located at the medial projection of the vertebral pedicle. The region overlying each aforementioned target is locally anesthetized with a 1 to 2 ml. volume of 1% Lidocaine without Epinephrine.   The spinal needle was inserted into each of the above mentioned facet joints using biplanar fluoroscopic guidance. A 0.25 to 0.5 ml. volume of Isovue-250 was injected and a partial facet joint arthrogram was obtained. A single spot film was obtained of the resulting arthrogram.    One to 1.25 ml of the steroid/anesthetic solution was then injected into each of the facet joints noted above.   Additional Comments:  The patient tolerated the procedure well Dressing: Band-Aid    Post-procedure details: Patient was observed during the procedure. Post-procedure instructions were reviewed.  Patient left the clinic in stable condition.     Clinical History: 07/16/2016  L3-L4: Preservation of disc height. Mild disc bulge and small  broad-based lateral disc protrusions, left greater than right. Moderate facet arthrosis. Thickening of the ligamentum flavum. Mild central canal and left greater than right lateral recess  stenosis. Can't exclude impingement of the traversing L4  nerve roots, particularly on the left. There is also mild to moderate left foraminal stenosis with protruding disc material contacting the exiting left L3 nerve root. No significant right foraminal  stenosis. #L4-L5: Minimal loss of disc height. Generalized disc bulge and small broad-based right lateral disc protrusion. Moderate facet arthrosis with hypertrophic changes and small effusions. Thickening of the ligamentum flavum. Mild central canal/lateral  recess stenosis. Mild right foraminal stenosis with protruding disc material contacting the exiting right L4 nerve root. #L5-S1: Moderate to severe loss of disc height. Generalized disc bulge and marginal spurring. Small broad-based central disc protrusion. Moderate facet arthrosis. Mild to moderate central canal and moderate subarticular/lateral recess stenosis.  Possible impingement of the traversing S1 nerve roots. Moderate bilateral foraminal stenosis.  She reports that she has never smoked. She has never used smokeless tobacco.   Recent Labs  07/17/16 1550 07/19/16 0829  HGBA1C 7.3*  --   LABURIC  --  7.9*    Objective:  VS:  HT:    WT:   BMI:     BP:119/64  HR:73bpm  TEMP:98.1 F (36.7 C)(Oral)  RESP:100 % Physical Exam  Musculoskeletal:  Concordant low back pain with extension rotation of the lumbar spine. No pain over the greater trochanters and good distal strength.    Ortho Exam Imaging: No results found.  Past Medical/Family/Surgical/Social History: Medications & Allergies reviewed per EMR Patient Active Problem List   Diagnosis Date Noted  . CHF (congestive heart failure) ([employer]) 07/17/2016  . Sepsis ([place]) 07/17/2016  . Influenza 07/17/2016  . Congestive heart failure ([place])   . Hypotension   . Urinary tract infection with hematuria    Past Medical History:  Diagnosis Date  . Anemia   . 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 CHF (congestive heart failure) ([place]) 07/17/2016  . Depression   . Family  history of adverse reaction to anesthesia    "her father had some issues when he had his gallbladder taken out when he was in his late 2s"  . GERD (gastroesophageal reflux disease)   . High cholesterol   . History of kidney stones   . Hypertension   . Type II diabetes mellitus ([place])    History reviewed. No pertinent family history. Past Surgical History:  Procedure Laterality Date  . BREAST BIOPSY     "? side; it wasn't anything"  . CHOLECYSTECTOMY OPEN     Social History   Occupational History  . Not on file.   Social History Main Topics  . Smoking status: Never Smoker  . Smokeless tobacco: Never Used  . Alcohol use No  . Drug use: No  . Sexual activity: Not on file

## 2016-09-19 NOTE — Procedures (Signed)
Lumbar Facet Joint Intra-Articular Injection(s) with Fluoroscopic Guidance  Patient: Michaela Santana      Date of Birth: September 09, 1945 MRN: [id number] PCP: Marjorie Smolder, MD      Visit Date: 09/17/2016   Universal Protocol:    Date/Time: 04/11/186:15 AM  Consent Given By: the patient  Position: PRONE   Additional Comments: Vital signs were monitored before and after the procedure. Patient was prepped and draped in the usual sterile fashion. The correct patient, procedure, and site was verified.   Injection Procedure Details:  Procedure Site One Meds Administered:  Meds ordered this encounter  Medications  . lidocaine (PF) (XYLOCAINE) 1 % injection 0.3 mL  . methylPREDNISolone acetate (DEPO-MEDROL) injection 80 mg     Laterality: Bilateral  Location/Site:  L5-S1  Needle size: 22 guage  Needle type: Spinal  Needle Placement: Articular  Findings:  -Contrast Used: 1 mL iohexol 180 mg iodine/mL   -Comments: Excellent flow of contrast producing a partial arthrogram.  Procedure Details: The fluoroscope beam is vertically oriented in AP, and the inferior recess is visualized beneath the lower pole of the inferior apophyseal process, which represents the target point for needle insertion. When direct visualization is difficult the target point is located at the medial projection of the vertebral pedicle. The region overlying each aforementioned target is locally anesthetized with a 1 to 2 ml. volume of 1% Lidocaine without Epinephrine.   The spinal needle was inserted into each of the above mentioned facet joints using biplanar fluoroscopic guidance. A 0.25 to 0.5 ml. volume of Isovue-250 was injected and a partial facet joint arthrogram was obtained. A single spot film was obtained of the resulting arthrogram.    One to 1.25 ml of the steroid/anesthetic solution was then injected into each of the facet joints noted above.   Additional Comments:  The patient tolerated the  procedure well Dressing: Band-Aid    Post-procedure details: Patient was observed during the procedure. Post-procedure instructions were reviewed.  Patient left the clinic in stable condition.

## 2016-09-20 DIAGNOSIS — M6281 Muscle weakness (generalized): Secondary | ICD-10-CM | POA: Diagnosis not present

## 2016-09-20 DIAGNOSIS — R262 Difficulty in walking, not elsewhere classified: Secondary | ICD-10-CM | POA: Diagnosis not present

## 2016-09-20 DIAGNOSIS — M545 Low back pain: Secondary | ICD-10-CM | POA: Diagnosis not present

## 2016-09-24 DIAGNOSIS — Z7984 Long term (current) use of oral hypoglycemic drugs: Secondary | ICD-10-CM | POA: Diagnosis not present

## 2016-09-24 DIAGNOSIS — I10 Essential (primary) hypertension: Secondary | ICD-10-CM | POA: Diagnosis not present

## 2016-09-24 DIAGNOSIS — E119 Type 2 diabetes mellitus without complications: Secondary | ICD-10-CM | POA: Diagnosis not present

## 2016-10-02 DIAGNOSIS — M6281 Muscle weakness (generalized): Secondary | ICD-10-CM | POA: Diagnosis not present

## 2016-10-02 DIAGNOSIS — R262 Difficulty in walking, not elsewhere classified: Secondary | ICD-10-CM | POA: Diagnosis not present

## 2016-10-02 DIAGNOSIS — M545 Low back pain: Secondary | ICD-10-CM | POA: Diagnosis not present

## 2016-10-04 DIAGNOSIS — M545 Low back pain: Secondary | ICD-10-CM | POA: Diagnosis not present

## 2016-10-04 DIAGNOSIS — R262 Difficulty in walking, not elsewhere classified: Secondary | ICD-10-CM | POA: Diagnosis not present

## 2016-10-04 DIAGNOSIS — M6281 Muscle weakness (generalized): Secondary | ICD-10-CM | POA: Diagnosis not present

## 2016-10-08 ENCOUNTER — Ambulatory Visit (INDEPENDENT_AMBULATORY_CARE_PROVIDER_SITE_OTHER): Payer: Medicare Other | Admitting: Orthopaedic Surgery

## 2016-10-11 DIAGNOSIS — M6281 Muscle weakness (generalized): Secondary | ICD-10-CM | POA: Diagnosis not present

## 2016-10-11 DIAGNOSIS — R262 Difficulty in walking, not elsewhere classified: Secondary | ICD-10-CM | POA: Diagnosis not present

## 2016-10-11 DIAGNOSIS — M545 Low back pain: Secondary | ICD-10-CM | POA: Diagnosis not present

## 2016-10-15 DIAGNOSIS — J0100 Acute maxillary sinusitis, unspecified: Secondary | ICD-10-CM | POA: Diagnosis not present

## 2016-10-17 ENCOUNTER — Ambulatory Visit (INDEPENDENT_AMBULATORY_CARE_PROVIDER_SITE_OTHER): Payer: Medicare Other | Admitting: Orthopaedic Surgery

## 2016-10-17 ENCOUNTER — Encounter (INDEPENDENT_AMBULATORY_CARE_PROVIDER_SITE_OTHER): Payer: Self-pay | Admitting: Orthopaedic Surgery

## 2016-10-17 VITALS — Ht 62.0 in | Wt 213.0 lb

## 2016-10-17 DIAGNOSIS — M47816 Spondylosis without myelopathy or radiculopathy, lumbar region: Secondary | ICD-10-CM | POA: Diagnosis not present

## 2016-10-17 NOTE — Addendum Note (Signed)
Addended by: Maxcine Ham on: 10/17/2016 01:36 PM   Modules accepted: Orders

## 2016-10-17 NOTE — Progress Notes (Signed)
The patient is following up after having bilateral lumbar spine facet joint injections to treat her spondylosis by Dr. Ernestina Patches. Accommodation of the facet joint injections with physical therapy this helped improve mobility. She feels a little better as well as her daughter feels that she is making good progress.  On my exam she is able to get out of a chair much easier. She is walking with a walking stick but overall appears to be more comfortable albeit her mobility still quite slow.  She is going continue physical therapy and I definitely think should benefit from a TENS unit dispense as an outpatient. Also feel that in a month having repeat facet joint injections by Dr. Ernestina Patches would definitely be worthwhile. We'll work on having that scheduled as well.

## 2016-10-18 DIAGNOSIS — R262 Difficulty in walking, not elsewhere classified: Secondary | ICD-10-CM | POA: Diagnosis not present

## 2016-10-18 DIAGNOSIS — M545 Low back pain: Secondary | ICD-10-CM | POA: Diagnosis not present

## 2016-10-18 DIAGNOSIS — M6281 Muscle weakness (generalized): Secondary | ICD-10-CM | POA: Diagnosis not present

## 2016-10-23 DIAGNOSIS — M545 Low back pain: Secondary | ICD-10-CM | POA: Diagnosis not present

## 2016-10-23 DIAGNOSIS — R262 Difficulty in walking, not elsewhere classified: Secondary | ICD-10-CM | POA: Diagnosis not present

## 2016-10-23 DIAGNOSIS — M6281 Muscle weakness (generalized): Secondary | ICD-10-CM | POA: Diagnosis not present

## 2016-10-25 DIAGNOSIS — R262 Difficulty in walking, not elsewhere classified: Secondary | ICD-10-CM | POA: Diagnosis not present

## 2016-10-25 DIAGNOSIS — M545 Low back pain: Secondary | ICD-10-CM | POA: Diagnosis not present

## 2016-10-25 DIAGNOSIS — M6281 Muscle weakness (generalized): Secondary | ICD-10-CM | POA: Diagnosis not present

## 2016-10-29 ENCOUNTER — Ambulatory Visit (INDEPENDENT_AMBULATORY_CARE_PROVIDER_SITE_OTHER): Payer: Medicare Other | Admitting: Physical Medicine and Rehabilitation

## 2016-10-29 ENCOUNTER — Encounter (INDEPENDENT_AMBULATORY_CARE_PROVIDER_SITE_OTHER): Payer: Self-pay | Admitting: Physical Medicine and Rehabilitation

## 2016-10-29 ENCOUNTER — Ambulatory Visit (INDEPENDENT_AMBULATORY_CARE_PROVIDER_SITE_OTHER): Payer: Medicare Other

## 2016-10-29 VITALS — BP 106/71 | HR 66

## 2016-10-29 DIAGNOSIS — M47816 Spondylosis without myelopathy or radiculopathy, lumbar region: Secondary | ICD-10-CM

## 2016-10-29 MED ORDER — METHYLPREDNISOLONE ACETATE 80 MG/ML IJ SUSP
80.0000 mg | Freq: Once | INTRAMUSCULAR | Status: AC
  Administered 2016-10-29: 80 mg

## 2016-10-29 MED ORDER — LIDOCAINE HCL (PF) 1 % IJ SOLN
2.0000 mL | Freq: Once | INTRAMUSCULAR | Status: AC
  Administered 2016-10-29: 2 mL

## 2016-10-29 NOTE — Patient Instructions (Signed)

## 2016-10-29 NOTE — Progress Notes (Deleted)
Patient states she had great relief with last injection. Still having pain every once in awhile. No pain today. Left side may be a little worse than right. Bilateral thigh pain.

## 2016-10-29 NOTE — Procedures (Signed)
Lumbar Facet Joint Intra-Articular Injection(s) with Fluoroscopic Guidance  Patient: Michaela Santana      Date of Birth: Dec 05, 1945 MRN: [id number] PCP: Darcus Austin, MD      Visit Date: 10/29/2016   Universal Protocol:    Date/Time: 05/21/189:59 AM  Consent Given By: the patient  Position: PRONE   Additional Comments: Vital signs were monitored before and after the procedure. Patient was prepped and draped in the usual sterile fashion. The correct patient, procedure, and site was verified.   Injection Procedure Details:  Procedure Site One Meds Administered:  Meds ordered this encounter  Medications  . lidocaine (PF) (XYLOCAINE) 1 % injection 2 mL  . methylPREDNISolone acetate (DEPO-MEDROL) injection 80 mg     Laterality: Bilateral  Location/Site:  L4-L5 L5-S1  Needle size: 22 guage  Needle type: Spinal  Needle Placement: Articular  Findings:  -Contrast Used: 1 mL iohexol 180 mg iodine/mL   -Comments: Excellent flow of contrast producing a partial arthrogram.  Procedure Details: The fluoroscope beam is vertically oriented in AP, and the inferior recess is visualized beneath the lower pole of the inferior apophyseal process, which represents the target point for needle insertion. When direct visualization is difficult the target point is located at the medial projection of the vertebral pedicle. The region overlying each aforementioned target is locally anesthetized with a 1 to 2 ml. volume of 1% Lidocaine without Epinephrine.   The spinal needle was inserted into each of the above mentioned facet joints using biplanar fluoroscopic guidance. A 0.25 to 0.5 ml. volume of Isovue-250 was injected and a partial facet joint arthrogram was obtained. A single spot film was obtained of the resulting arthrogram.    One to 1.25 ml of the steroid/anesthetic solution was then injected into each of the facet joints noted above.   Additional Comments:  The patient tolerated the  procedure well Dressing: Band-Aid    Post-procedure details: Patient was observed during the procedure. Post-procedure instructions were reviewed.  Patient left the clinic in stable condition.

## 2016-10-30 DIAGNOSIS — M6281 Muscle weakness (generalized): Secondary | ICD-10-CM | POA: Diagnosis not present

## 2016-10-30 DIAGNOSIS — M545 Low back pain: Secondary | ICD-10-CM | POA: Diagnosis not present

## 2016-10-30 DIAGNOSIS — R262 Difficulty in walking, not elsewhere classified: Secondary | ICD-10-CM | POA: Diagnosis not present

## 2016-10-30 NOTE — Progress Notes (Signed)
MISHAWN DIDION - 71 y.o. female MRN [id number]  Date of birth: [birth year]  Office Visit Note: Visit Date: 10/29/2016 PCP: Darcus Austin, MD Referred by: Darcus Austin, MD  Subjective: Chief Complaint  Patient presents with  . Lower Back - Pain   HPI: Mrs. Patchen is a pleasant 71 year old female with chronic long-term and worsening over the last several months of low back pain. Her pain is axial without radicular complaints. MRI findings are mostly consistent with facet arthropathy at L4-5 and L5-S1 without central canal stenosis but some mild to moderate lateral recess narrowing at L5-S1. She got extremely good relief with prior facet joint block at L5-S1. This was on April 9. She reports continued help with that but still continued significant back pain. She really feels like overall she has done better after the injection but is still having the complaints that she feels like it's not functional. She has difficulty standing for any length of time and twisting and bending. Again no radicular complaints no leg pain. She saw Dr. Ninfa Linden in follow-up who suggested another injection in a month or 2. I tried to explain to her that we really just do this by symptomatic approach and there is not a timing of injections per se we discussed the use of steroid medications and injections and the goals.    ROS Otherwise per HPI.  Assessment & Plan: Visit Diagnoses:  1. Spondylosis without myelopathy or radiculopathy, lumbar region     Plan: Findings:  Chronic worsening axial low back pain with relief after L5-S1 bilateral injections. I think the remainder of her pain is probably twofold is probably the L4-5 joints that we didn't treat as well as maybe some return of the L5-S1 area. Should be a good candidate for radiofrequency ablation. We'll go ahead and repeat a second block paradigm but included the L4-5 facet joints. She'll continue with exercise program and current medications. We did perform  bilateral L4-L5-S1 facet joint blocks.    Meds & Orders:  Meds ordered this encounter  Medications  . lidocaine (PF) (XYLOCAINE) 1 % injection 2 mL  . methylPREDNISolone acetate (DEPO-MEDROL) injection 80 mg    Orders Placed This Encounter  Procedures  . Facet Injection  . XR C-ARM NO REPORT    Follow-up: Return if symptoms worsen or fail to improve.   Procedures: No procedures performed  Lumbar Facet Joint Intra-Articular Injection(s) with Fluoroscopic Guidance  Patient: HAMDI VARI      Date of Birth: 04-23-1946 MRN: [id number] PCP: Darcus Austin, MD      Visit Date: 10/29/2016   Universal Protocol:    Date/Time: 05/21/189:59 AM  Consent Given By: the patient  Position: PRONE   Additional Comments: Vital signs were monitored before and after the procedure. Patient was prepped and draped in the usual sterile fashion. The correct patient, procedure, and site was verified.   Injection Procedure Details:  Procedure Site One Meds Administered:  Meds ordered this encounter  Medications  . lidocaine (PF) (XYLOCAINE) 1 % injection 2 mL  . methylPREDNISolone acetate (DEPO-MEDROL) injection 80 mg     Laterality: Bilateral  Location/Site:  L4-L5 L5-S1  Needle size: 22 guage  Needle type: Spinal  Needle Placement: Articular  Findings:  -Contrast Used: 1 mL iohexol 180 mg iodine/mL   -Comments: Excellent flow of contrast producing a partial arthrogram.  Procedure Details: The fluoroscope beam is vertically oriented in AP, and the inferior recess is visualized beneath the lower pole of the inferior apophyseal  process, which represents the target point for needle insertion. When direct visualization is difficult the target point is located at the medial projection of the vertebral pedicle. The region overlying each aforementioned target is locally anesthetized with a 1 to 2 ml. volume of 1% Lidocaine without Epinephrine.   The spinal needle was inserted into each  of the above mentioned facet joints using biplanar fluoroscopic guidance. A 0.25 to 0.5 ml. volume of Isovue-250 was injected and a partial facet joint arthrogram was obtained. A single spot film was obtained of the resulting arthrogram.    One to 1.25 ml of the steroid/anesthetic solution was then injected into each of the facet joints noted above.   Additional Comments:  The patient tolerated the procedure well Dressing: Band-Aid    Post-procedure details: Patient was observed during the procedure. Post-procedure instructions were reviewed.  Patient left the clinic in stable condition.     Clinical History: 07/16/2016  L3-L4: Preservation of disc height. Mild disc bulge and small broad-based lateral disc protrusions, left greater than right. Moderate facet arthrosis. Thickening of the ligamentum flavum. Mild central canal and left greater than right lateral recess  stenosis. Can't exclude impingement of the traversing L4 nerve roots, particularly on the left. There is also mild to moderate left foraminal stenosis with protruding disc material contacting the exiting left L3 nerve root. No significant right foraminal  stenosis. #L4-L5: Minimal loss of disc height. Generalized disc bulge and small broad-based right lateral disc protrusion. Moderate facet arthrosis with hypertrophic changes and small effusions. Thickening of the ligamentum flavum. Mild central canal/lateral  recess stenosis. Mild right foraminal stenosis with protruding disc material contacting the exiting right L4 nerve root. #L5-S1: Moderate to severe loss of disc height. Generalized disc bulge and marginal spurring. Small broad-based central disc protrusion. Moderate facet arthrosis. Mild to moderate central canal and moderate subarticular/lateral recess stenosis.  Possible impingement of the traversing S1 nerve roots. Moderate bilateral foraminal stenosis.  She reports that she has never smoked. She has never used  smokeless tobacco.   Recent Labs  07/17/16 1550 07/19/16 0829  HGBA1C 7.3*  --   LABURIC  --  7.9*    Objective:  VS:  HT:    WT:   BMI:     BP:106/71  HR:66bpm  TEMP: ( )  RESP:99 % Physical Exam  Musculoskeletal:  Concordant low back pain with extension rotation of the lumbar spine. No pain over the greater trochanters and good distal strength.    Ortho Exam Imaging: Xr C-arm No Report  Result Date: 10/29/2016 Please see Notes or Procedures tab for imaging impression.   Past Medical/Family/Surgical/Social History: Medications & Allergies reviewed per EMR Patient Active Problem List   Diagnosis Date Noted  . Spondylosis without myelopathy or radiculopathy, lumbar region 10/17/2016  . CHF (congestive heart failure) ([place]) 07/17/2016  . Sepsis ([place]) 07/17/2016  . Influenza 07/17/2016  . Congestive heart failure ([place])   . Hypotension   . Urinary tract infection with hematuria    Past Medical History:  Diagnosis Date  . Anemia   . 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 CHF (congestive heart failure) ([place]) 07/17/2016  . Depression   . Family history of adverse reaction to anesthesia    "her father had some issues when he had his gallbladder taken out when he was in his late 28s"  . GERD (gastroesophageal reflux disease)   . High cholesterol   . History of kidney stones   .  Hypertension   . Type II diabetes mellitus ([place])    No family history on file. Past Surgical History:  Procedure Laterality Date  . BREAST BIOPSY     "? side; it wasn't anything"  . CHOLECYSTECTOMY OPEN     Social History   Occupational History  . Not on file.   Social History Main Topics  . Smoking status: Never Smoker  . Smokeless tobacco: Never Used  . Alcohol use No  . Drug use: No  . Sexual activity: Not on file

## 2016-11-01 DIAGNOSIS — M545 Low back pain: Secondary | ICD-10-CM | POA: Diagnosis not present

## 2016-11-01 DIAGNOSIS — R262 Difficulty in walking, not elsewhere classified: Secondary | ICD-10-CM | POA: Diagnosis not present

## 2016-11-01 DIAGNOSIS — M6281 Muscle weakness (generalized): Secondary | ICD-10-CM | POA: Diagnosis not present

## 2016-11-06 DIAGNOSIS — M6281 Muscle weakness (generalized): Secondary | ICD-10-CM | POA: Diagnosis not present

## 2016-11-06 DIAGNOSIS — R262 Difficulty in walking, not elsewhere classified: Secondary | ICD-10-CM | POA: Diagnosis not present

## 2016-11-06 DIAGNOSIS — M545 Low back pain: Secondary | ICD-10-CM | POA: Diagnosis not present

## 2016-11-19 DIAGNOSIS — M6281 Muscle weakness (generalized): Secondary | ICD-10-CM | POA: Diagnosis not present

## 2016-11-19 DIAGNOSIS — R262 Difficulty in walking, not elsewhere classified: Secondary | ICD-10-CM | POA: Diagnosis not present

## 2016-11-19 DIAGNOSIS — M545 Low back pain: Secondary | ICD-10-CM | POA: Diagnosis not present

## 2016-11-21 DIAGNOSIS — I10 Essential (primary) hypertension: Secondary | ICD-10-CM | POA: Diagnosis not present

## 2016-11-21 DIAGNOSIS — R05 Cough: Secondary | ICD-10-CM | POA: Diagnosis not present

## 2016-11-26 DIAGNOSIS — M545 Low back pain: Secondary | ICD-10-CM | POA: Diagnosis not present

## 2016-11-26 DIAGNOSIS — R262 Difficulty in walking, not elsewhere classified: Secondary | ICD-10-CM | POA: Diagnosis not present

## 2016-11-26 DIAGNOSIS — M6281 Muscle weakness (generalized): Secondary | ICD-10-CM | POA: Diagnosis not present

## 2016-12-03 DIAGNOSIS — R262 Difficulty in walking, not elsewhere classified: Secondary | ICD-10-CM | POA: Diagnosis not present

## 2016-12-03 DIAGNOSIS — M6281 Muscle weakness (generalized): Secondary | ICD-10-CM | POA: Diagnosis not present

## 2016-12-03 DIAGNOSIS — M545 Low back pain: Secondary | ICD-10-CM | POA: Diagnosis not present

## 2016-12-17 ENCOUNTER — Ambulatory Visit (INDEPENDENT_AMBULATORY_CARE_PROVIDER_SITE_OTHER): Payer: Medicare Other | Admitting: Orthopaedic Surgery

## 2016-12-17 DIAGNOSIS — M47816 Spondylosis without myelopathy or radiculopathy, lumbar region: Secondary | ICD-10-CM | POA: Diagnosis not present

## 2016-12-17 NOTE — Progress Notes (Signed)
The patient is following up just under 2 months status post bilateral L4-L5 and L5-S1 facet joint injections by Dr. Ernestina Patches. She says that she is doing better since the injections and feels like she is making good progress. She's been also going to physical therapy. She's been working on core strengthening and trying to work on losing weight as well.  On exam she has excellent flexion-extension of her lumbar spine with minimal discomfort. She has excellent strength in her bilateral lower extremity.  All questions were encouraged and answered. She'll follow-up as needed from our standpoint however if her pain significantly worsens in any amount of time she'll let us know. She can always have repeat injections down the road if needed.

## 2017-01-14 DIAGNOSIS — R509 Fever, unspecified: Secondary | ICD-10-CM | POA: Diagnosis not present

## 2017-01-14 DIAGNOSIS — I10 Essential (primary) hypertension: Secondary | ICD-10-CM | POA: Diagnosis not present

## 2017-01-14 DIAGNOSIS — E119 Type 2 diabetes mellitus without complications: Secondary | ICD-10-CM | POA: Diagnosis not present

## 2017-01-28 DIAGNOSIS — H6981 Other specified disorders of Eustachian tube, right ear: Secondary | ICD-10-CM | POA: Diagnosis not present

## 2017-02-13 DIAGNOSIS — F322 Major depressive disorder, single episode, severe without psychotic features: Secondary | ICD-10-CM | POA: Diagnosis not present

## 2017-02-13 DIAGNOSIS — M8580 Other specified disorders of bone density and structure, unspecified site: Secondary | ICD-10-CM | POA: Diagnosis not present

## 2017-02-13 DIAGNOSIS — Z0000 Encounter for general adult medical examination without abnormal findings: Secondary | ICD-10-CM | POA: Diagnosis not present

## 2017-02-13 DIAGNOSIS — E7800 Pure hypercholesterolemia, unspecified: Secondary | ICD-10-CM | POA: Diagnosis not present

## 2017-02-13 DIAGNOSIS — Z6832 Body mass index (BMI) 32.0-32.9, adult: Secondary | ICD-10-CM | POA: Diagnosis not present

## 2017-02-13 DIAGNOSIS — I10 Essential (primary) hypertension: Secondary | ICD-10-CM | POA: Diagnosis not present

## 2017-02-13 DIAGNOSIS — E559 Vitamin D deficiency, unspecified: Secondary | ICD-10-CM | POA: Diagnosis not present

## 2017-02-13 DIAGNOSIS — Z1159 Encounter for screening for other viral diseases: Secondary | ICD-10-CM | POA: Diagnosis not present

## 2017-02-13 DIAGNOSIS — H9190 Unspecified hearing loss, unspecified ear: Secondary | ICD-10-CM | POA: Diagnosis not present

## 2017-02-13 DIAGNOSIS — E119 Type 2 diabetes mellitus without complications: Secondary | ICD-10-CM | POA: Diagnosis not present

## 2017-02-15 DIAGNOSIS — H6981 Other specified disorders of Eustachian tube, right ear: Secondary | ICD-10-CM | POA: Diagnosis not present

## 2017-02-15 DIAGNOSIS — H903 Sensorineural hearing loss, bilateral: Secondary | ICD-10-CM | POA: Diagnosis not present

## 2017-03-14 DIAGNOSIS — E119 Type 2 diabetes mellitus without complications: Secondary | ICD-10-CM | POA: Diagnosis not present

## 2017-03-15 DIAGNOSIS — Z23 Encounter for immunization: Secondary | ICD-10-CM | POA: Diagnosis not present

## 2017-03-28 DIAGNOSIS — M8588 Other specified disorders of bone density and structure, other site: Secondary | ICD-10-CM | POA: Diagnosis not present

## 2017-06-12 DIAGNOSIS — J209 Acute bronchitis, unspecified: Secondary | ICD-10-CM | POA: Diagnosis not present

## 2017-07-09 DIAGNOSIS — H40011 Open angle with borderline findings, low risk, right eye: Secondary | ICD-10-CM | POA: Diagnosis not present

## 2017-07-30 ENCOUNTER — Other Ambulatory Visit: Payer: Self-pay | Admitting: Family Medicine

## 2017-07-30 DIAGNOSIS — Z139 Encounter for screening, unspecified: Secondary | ICD-10-CM

## 2017-08-05 IMAGING — US US RENAL
1 series · 14 of 25 positions shown · non-contrast
Comparison: None.

CLINICAL DATA: Acute renal failure.

EXAM:
RENAL / URINARY TRACT ULTRASOUND COMPLETE

[Series 1: us renal · 0.28mm/px · 14 of 44 slices shown]
[im 1/44]
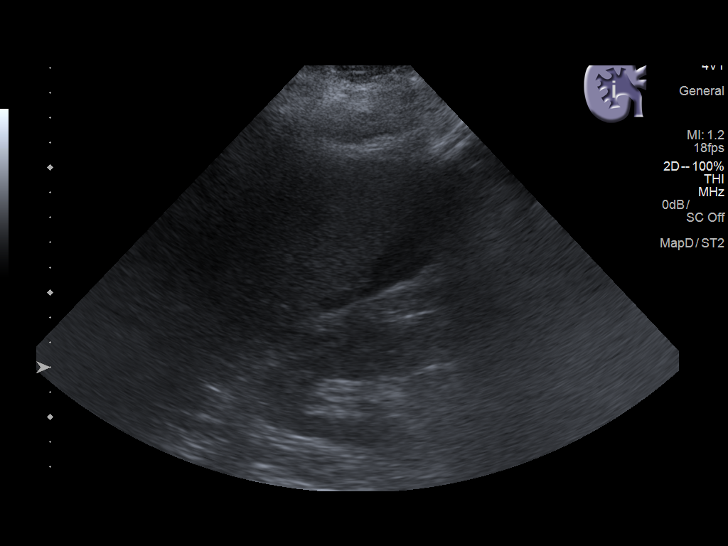
[im 4/44]
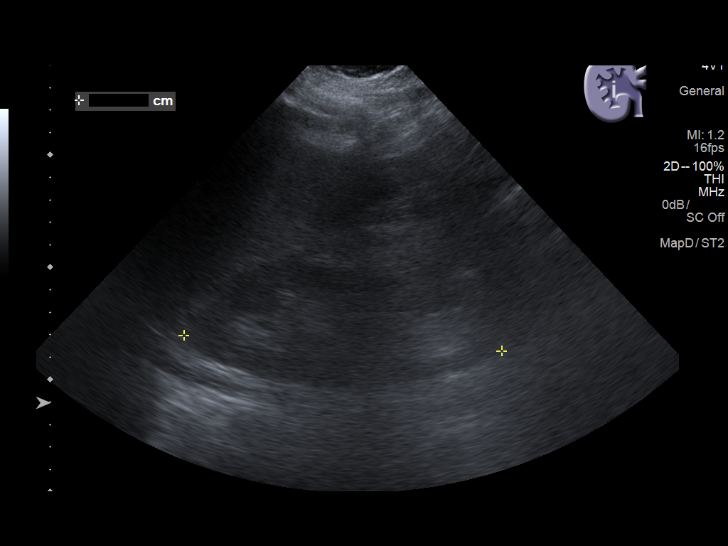
[im 8/44]
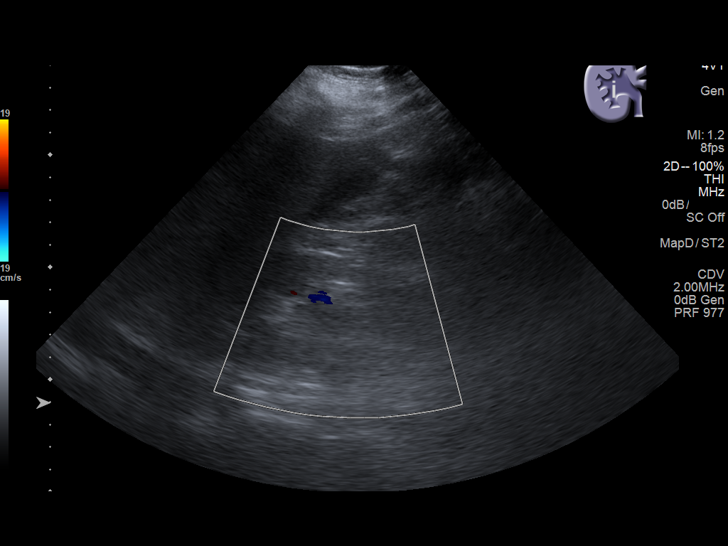
[im 11/44]
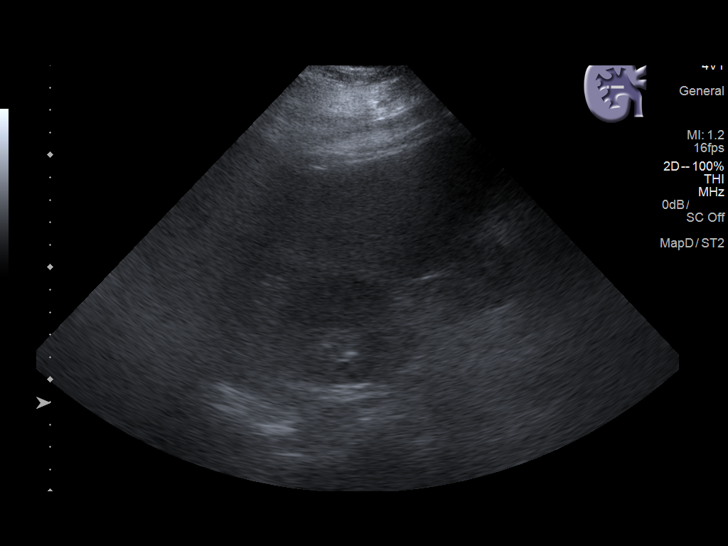
[im 15/44]
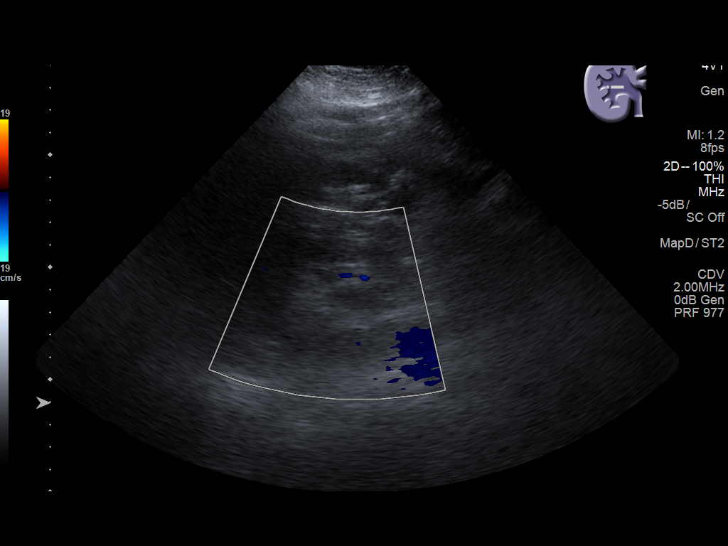
[im 17/44]
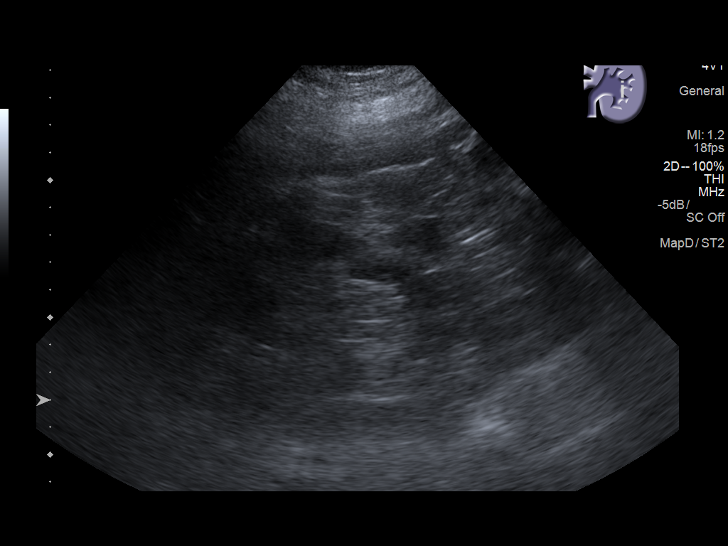
[im 20/44]
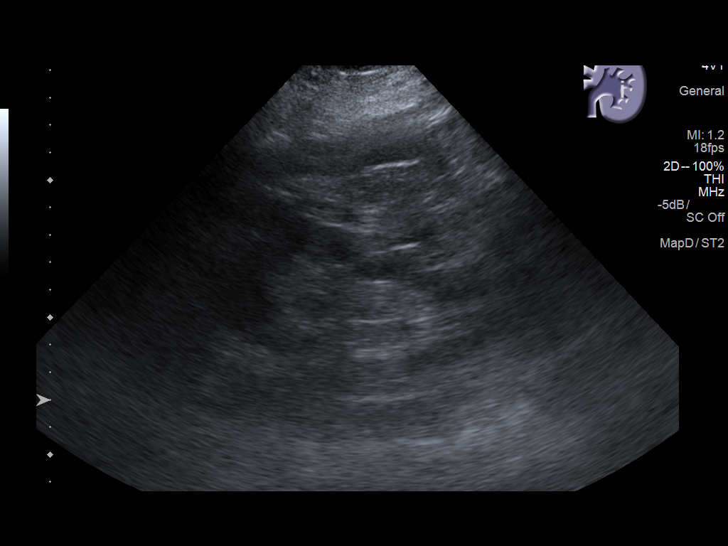
[im 24/44]
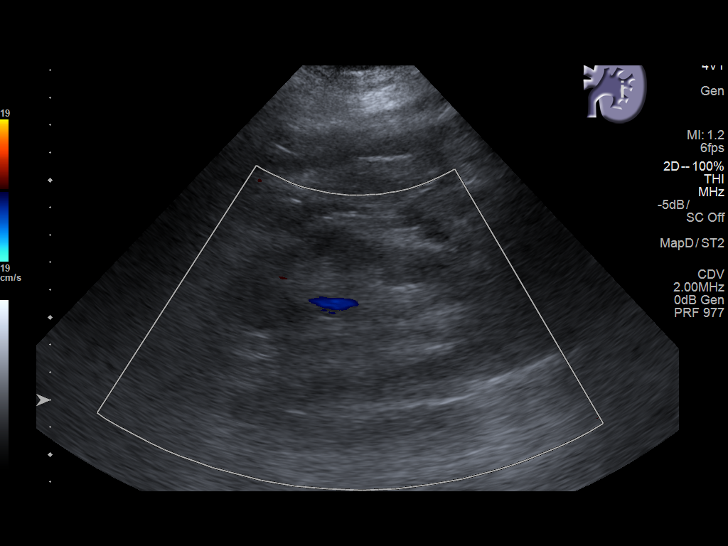
[im 27/44]
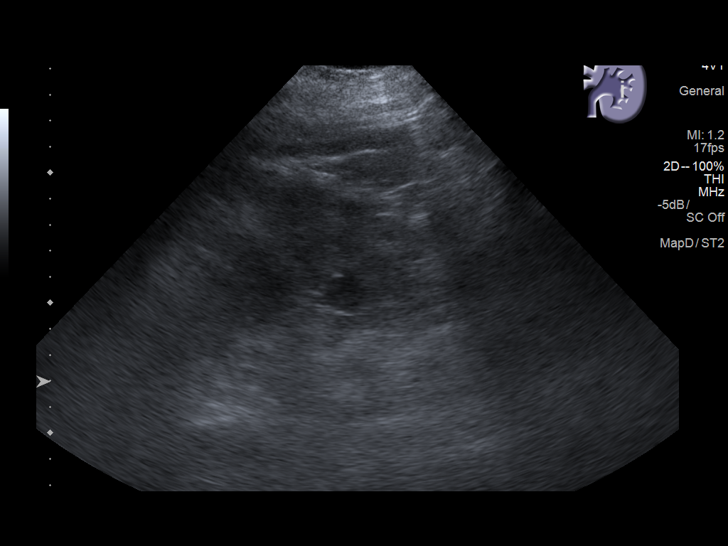
[im 29/44]
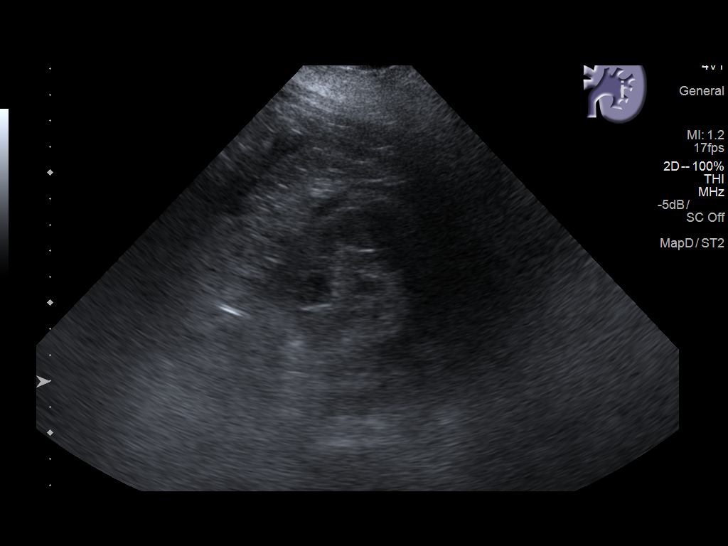
[im 33/44]
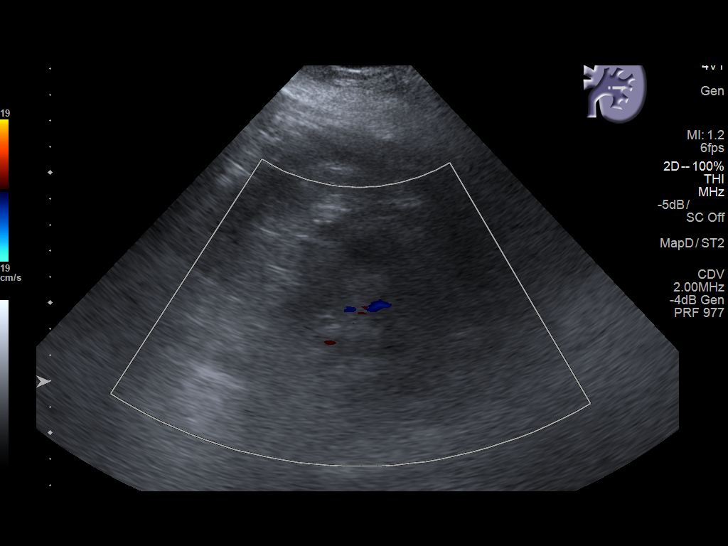
[im 36/44]
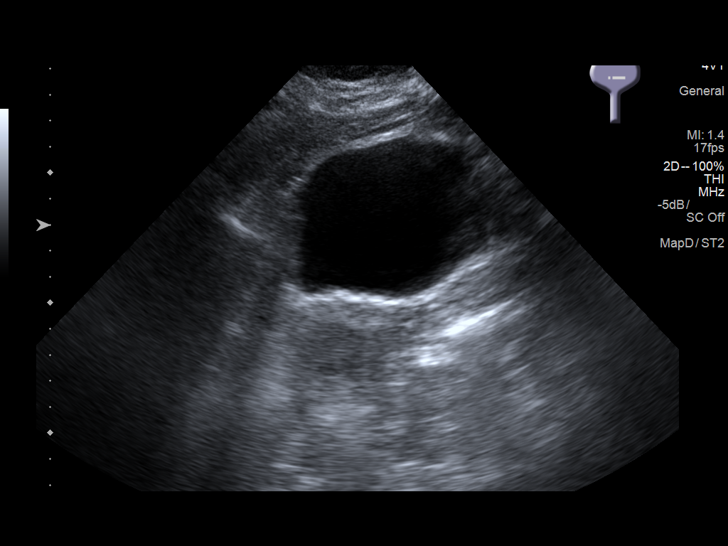
[im 40/44]
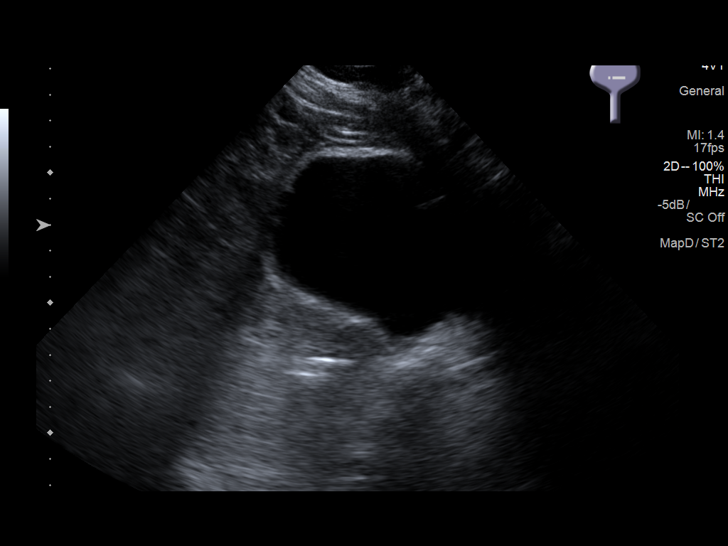
[im 44/44]
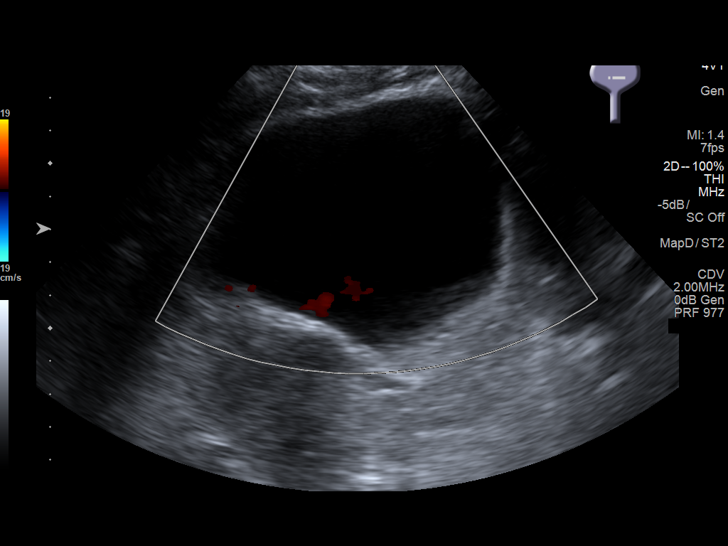

[14 of 25 positions shown; findings below may reference images not displayed]

FINDINGS: Right Kidney:

Length: 14.2 cm, within normal limits. There is mild prominence of
the right renal collecting system. Echogenicity within normal
limits. No mass visualized.

Left Kidney:

Length: 13.8 cm, within normal limits. Echogenicity within normal
limits. A 1.6 cm benign appearing cyst is present at the lower pole.
No mass or hydronephrosis visualized.

Bladder:

Appears normal for degree of bladder distention. Bilateral ureteral
jets are noted.

Minimal free fluid is noted adjacent to the liver.
IMPRESSION: 1. Mild fullness of the right renal collecting system without
significant hydronephrosis.
2. 1.6 cm simple cyst at the lower pole of the left kidney.
3. A small amount of free fluid is present adjacent to the liver.

## 2017-08-12 DIAGNOSIS — I10 Essential (primary) hypertension: Secondary | ICD-10-CM | POA: Diagnosis not present

## 2017-08-12 DIAGNOSIS — F322 Major depressive disorder, single episode, severe without psychotic features: Secondary | ICD-10-CM | POA: Diagnosis not present

## 2017-08-12 DIAGNOSIS — E559 Vitamin D deficiency, unspecified: Secondary | ICD-10-CM | POA: Diagnosis not present

## 2017-08-12 DIAGNOSIS — Z6833 Body mass index (BMI) 33.0-33.9, adult: Secondary | ICD-10-CM | POA: Diagnosis not present

## 2017-08-12 DIAGNOSIS — E7800 Pure hypercholesterolemia, unspecified: Secondary | ICD-10-CM | POA: Diagnosis not present

## 2017-08-12 DIAGNOSIS — E669 Obesity, unspecified: Secondary | ICD-10-CM | POA: Diagnosis not present

## 2017-08-12 DIAGNOSIS — K219 Gastro-esophageal reflux disease without esophagitis: Secondary | ICD-10-CM | POA: Diagnosis not present

## 2017-08-12 DIAGNOSIS — E119 Type 2 diabetes mellitus without complications: Secondary | ICD-10-CM | POA: Diagnosis not present

## 2017-08-20 DIAGNOSIS — E119 Type 2 diabetes mellitus without complications: Secondary | ICD-10-CM | POA: Diagnosis not present

## 2017-08-22 ENCOUNTER — Ambulatory Visit
Admission: RE | Admit: 2017-08-22 | Discharge: 2017-08-22 | Disposition: A | Discharge status: Home / Self Care | Payer: Medicare Other | Source: Ambulatory Visit | Attending: Family Medicine | Admitting: Family Medicine

## 2017-08-22 DIAGNOSIS — Z139 Encounter for screening, unspecified: Secondary | ICD-10-CM

## 2017-08-22 DIAGNOSIS — Z1231 Encounter for screening mammogram for malignant neoplasm of breast: Secondary | ICD-10-CM | POA: Diagnosis not present

## 2017-09-10 DIAGNOSIS — E119 Type 2 diabetes mellitus without complications: Secondary | ICD-10-CM | POA: Diagnosis not present

## 2017-10-23 ENCOUNTER — Telehealth (INDEPENDENT_AMBULATORY_CARE_PROVIDER_SITE_OTHER): Payer: Self-pay | Admitting: Physical Medicine and Rehabilitation

## 2017-10-23 NOTE — Telephone Encounter (Signed)
Yes if meets criteria

## 2017-10-24 NOTE — Telephone Encounter (Signed)
Can you call this patient this morning to discuss/ schedule?

## 2017-10-24 NOTE — Telephone Encounter (Signed)
Pt states no new injuries/traumas and pain is the same as last time. Pt states Medicare as her Primary Coverage and Christella Scheuermann is secondary coverage, no prior Josem Kaufmann is required. Pt is scheduled for 11/12/17

## 2017-11-12 ENCOUNTER — Ambulatory Visit (INDEPENDENT_AMBULATORY_CARE_PROVIDER_SITE_OTHER): Payer: Medicare Other | Admitting: Physical Medicine and Rehabilitation

## 2017-11-12 ENCOUNTER — Ambulatory Visit (INDEPENDENT_AMBULATORY_CARE_PROVIDER_SITE_OTHER): Payer: Medicare Other

## 2017-11-12 VITALS — BP 120/80

## 2017-11-12 DIAGNOSIS — M545 Low back pain, unspecified: Secondary | ICD-10-CM

## 2017-11-12 DIAGNOSIS — M47816 Spondylosis without myelopathy or radiculopathy, lumbar region: Secondary | ICD-10-CM

## 2017-11-12 DIAGNOSIS — G8929 Other chronic pain: Secondary | ICD-10-CM

## 2017-11-12 MED ORDER — METHYLPREDNISOLONE ACETATE 80 MG/ML IJ SUSP: 80.0000 mg | Freq: Once | INTRAMUSCULAR | Status: DC

## 2017-11-12 NOTE — Progress Notes (Signed)
Michaela Santana - 72 y.o. female MRN [id number]  Date of birth: [birth year]  Office Visit Note: Visit Date: 11/12/2017 PCP: Darcus Austin, MD Referred by: Darcus Austin, MD  Subjective: Chief Complaint  Patient presents with  . Lower Back - Pain  . Right Leg - Pain  . Left Leg - Pain   HPI: Michaela Santana is a 73 year old female patient followed by Dr. Jean Santana in our office.  We saw her last year in the early part of the spring 2018 and completed facet joint blocks at L5-S1 and L4-5 with good relief more than 50 to 60% relief up until recently.  She says really for the last couple of months she is having worsening axial low back pain some referral into the lateral hips but nothing past the knee and no paresthesias.  She has had no new trauma.  She reports walking and standing make the pain worse and Advil does ease it some.  We are going to repeat facet joint blocks at L4-5 and L5-S1.  Depending on relief would look potentially at radiofrequency ablation of these joints.   ROS Otherwise per HPI.  Assessment & Plan: Visit Diagnoses:  1. Spondylosis without myelopathy or radiculopathy, lumbar region   2. Chronic bilateral low back pain without sciatica     Plan: No additional findings.   Meds & Orders:  Meds ordered this encounter  Medications  . methylPREDNISolone acetate (DEPO-MEDROL) injection 80 mg    Orders Placed This Encounter  Procedures  . Facet Injection  . XR C-ARM NO REPORT    Follow-up: Return if symptoms worsen or fail to improve.   Procedures: No procedures performed  Lumbar Facet Joint Intra-Articular Injection(s) with Fluoroscopic Guidance  Patient: Michaela Santana      Date of Birth: [birth year] MRN: [id number] PCP: Darcus Austin, MD      Visit Date: 11/12/2017   Universal Protocol:    Date/Time: 11/12/2017  Consent Given By: the patient  Position: PRONE   Additional Comments: Vital signs were monitored before and after the procedure. Patient  was prepped and draped in the usual sterile fashion. The correct patient, procedure, and site was verified.   Injection Procedure Details:  Procedure Site One Meds Administered:  Meds ordered this encounter  Medications  . methylPREDNISolone acetate (DEPO-MEDROL) injection 80 mg     Laterality: Bilateral  Location/Site:  L4-L5 L5-S1  Needle size: 22 guage  Needle type: Spinal  Needle Placement: Articular  Findings:  -Comments: Excellent flow of contrast producing a partial arthrogram.  Procedure Details: The fluoroscope beam is vertically oriented in AP, and the inferior recess is visualized beneath the lower pole of the inferior apophyseal process, which represents the target point for needle insertion. When direct visualization is difficult the target point is located at the medial projection of the vertebral pedicle. The region overlying each aforementioned target is locally anesthetized with a 1 to 2 ml. volume of 1% Lidocaine without Epinephrine.   The spinal needle was inserted into each of the above mentioned facet joints using biplanar fluoroscopic guidance. A 0.25 to 0.5 ml. volume of Isovue-250 was injected and a partial facet joint arthrogram was obtained. A single spot film was obtained of the resulting arthrogram.    One to 1.25 ml of the steroid/anesthetic solution was then injected into each of the facet joints noted above.   Additional Comments:  The patient tolerated the procedure well Dressing: Band-Aid    Post-procedure details: Patient was observed during  the procedure. Post-procedure instructions were reviewed.  Patient left the clinic in stable condition.    Clinical History: 07/16/2016  L3-L4: Preservation of disc height. Mild disc bulge and small broad-based lateral disc protrusions, left greater than right. Moderate facet arthrosis. Thickening of the ligamentum flavum. Mild central canal and left greater than right lateral recess  stenosis.  Can't exclude impingement of the traversing L4 nerve roots, particularly on the left. There is also mild to moderate left foraminal stenosis with protruding disc material contacting the exiting left L3 nerve root. No significant right foraminal  stenosis. #L4-L5: Minimal loss of disc height. Generalized disc bulge and small broad-based right lateral disc protrusion. Moderate facet arthrosis with hypertrophic changes and small effusions. Thickening of the ligamentum flavum. Mild central canal/lateral  recess stenosis. Mild right foraminal stenosis with protruding disc material contacting the exiting right L4 nerve root. #L5-S1: Moderate to severe loss of disc height. Generalized disc bulge and marginal spurring. Small broad-based central disc protrusion. Moderate facet arthrosis. Mild to moderate central canal and moderate subarticular/lateral recess stenosis.  Possible impingement of the traversing S1 nerve roots. Moderate bilateral foraminal stenosis.   She reports that she has never smoked. She has never used smokeless tobacco. No results for input(s): HGBA1C, LABURIC in the last 8760 hours.  Objective:  VS:  HT:    WT:   BMI:     BP:120/80  HR: bpm  TEMP: ( )  RESP:  Physical Exam  Ortho Exam Imaging: No results found.  Past Medical/Family/Surgical/Social History: Medications & Allergies reviewed per EMR, new medications updated. Patient Active Problem List   Diagnosis Date Noted  . Spondylosis without myelopathy or radiculopathy, lumbar region 10/17/2016  . CHF (congestive heart failure) ([place]) 07/17/2016  . Sepsis ([place]) 07/17/2016  . Influenza 07/17/2016  . Congestive heart failure ([place])   . Hypotension   . Urinary tract infection with hematuria    Past Medical History:  Diagnosis Date  . Anemia   . 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 CHF (congestive heart failure) ([place]) 07/17/2016  . Depression   . Family history of adverse reaction to anesthesia    "her  father had some issues when he had his gallbladder taken out when he was in his late 25s"  . GERD (gastroesophageal reflux disease)   . High cholesterol   . History of kidney stones   . Hypertension   . Type II diabetes mellitus (HCC)    Family History  Problem Relation Age of Onset  . Breast cancer Neg Hx    Past Surgical History:  Procedure Laterality Date  . BREAST BIOPSY     "? side; it wasn't anything"  . CHOLECYSTECTOMY OPEN     Social History   Occupational History  . Not on file  Tobacco Use  . Smoking status: Never Smoker  . Smokeless tobacco: Never Used  Substance and Sexual Activity  . Alcohol use: No  . Drug use: No  . Sexual activity: Not on file

## 2017-11-12 NOTE — Progress Notes (Signed)
 .  Numeric Pain Rating Scale and Functional Assessment Average Pain 5   In the last MONTH (on 0-10 scale) has pain interfered with the following?  1. General activity like being  able to carry out your everyday physical activities such as walking, climbing stairs, carrying groceries, or moving a chair?  Rating(7)   +Driver, -BT, -Dye Allergies.

## 2017-11-12 NOTE — Patient Instructions (Signed)

## 2017-12-05 DIAGNOSIS — L989 Disorder of the skin and subcutaneous tissue, unspecified: Secondary | ICD-10-CM | POA: Diagnosis not present

## 2017-12-05 DIAGNOSIS — F4321 Adjustment disorder with depressed mood: Secondary | ICD-10-CM | POA: Diagnosis not present

## 2017-12-17 DIAGNOSIS — R300 Dysuria: Secondary | ICD-10-CM | POA: Diagnosis not present

## 2017-12-19 DIAGNOSIS — K045 Chronic apical periodontitis: Secondary | ICD-10-CM | POA: Diagnosis not present

## 2017-12-20 NOTE — Procedures (Signed)
Lumbar Facet Joint Intra-Articular Injection(s) with Fluoroscopic Guidance  Patient: Michaela Santana      Date of Birth: [birth year] MRN: [id number] PCP: Darcus Austin, MD      Visit Date: 11/12/2017   Universal Protocol:    Date/Time: 11/12/2017  Consent Given By: the patient  Position: PRONE   Additional Comments: Vital signs were monitored before and after the procedure. Patient was prepped and draped in the usual sterile fashion. The correct patient, procedure, and site was verified.   Injection Procedure Details:  Procedure Site One Meds Administered:  Meds ordered this encounter  Medications  . methylPREDNISolone acetate (DEPO-MEDROL) injection 80 mg     Laterality: Bilateral  Location/Site:  L4-L5 L5-S1  Needle size: 22 guage  Needle type: Spinal  Needle Placement: Articular  Findings:  -Comments: Excellent flow of contrast producing a partial arthrogram.  Procedure Details: The fluoroscope beam is vertically oriented in AP, and the inferior recess is visualized beneath the lower pole of the inferior apophyseal process, which represents the target point for needle insertion. When direct visualization is difficult the target point is located at the medial projection of the vertebral pedicle. The region overlying each aforementioned target is locally anesthetized with a 1 to 2 ml. volume of 1% Lidocaine without Epinephrine.   The spinal needle was inserted into each of the above mentioned facet joints using biplanar fluoroscopic guidance. A 0.25 to 0.5 ml. volume of Isovue-250 was injected and a partial facet joint arthrogram was obtained. A single spot film was obtained of the resulting arthrogram.    One to 1.25 ml of the steroid/anesthetic solution was then injected into each of the facet joints noted above.   Additional Comments:  The patient tolerated the procedure well Dressing: Band-Aid    Post-procedure details: Patient was observed during the  procedure. Post-procedure instructions were reviewed.  Patient left the clinic in stable condition.

## 2018-01-07 DIAGNOSIS — H40013 Open angle with borderline findings, low risk, bilateral: Secondary | ICD-10-CM | POA: Diagnosis not present

## 2018-02-21 DIAGNOSIS — J069 Acute upper respiratory infection, unspecified: Secondary | ICD-10-CM | POA: Diagnosis not present

## 2018-03-03 DIAGNOSIS — M545 Low back pain: Secondary | ICD-10-CM | POA: Diagnosis not present

## 2018-03-03 DIAGNOSIS — Z0000 Encounter for general adult medical examination without abnormal findings: Secondary | ICD-10-CM | POA: Diagnosis not present

## 2018-03-03 DIAGNOSIS — I10 Essential (primary) hypertension: Secondary | ICD-10-CM | POA: Diagnosis not present

## 2018-03-03 DIAGNOSIS — E7800 Pure hypercholesterolemia, unspecified: Secondary | ICD-10-CM | POA: Diagnosis not present

## 2018-03-03 DIAGNOSIS — E1165 Type 2 diabetes mellitus with hyperglycemia: Secondary | ICD-10-CM | POA: Diagnosis not present

## 2018-03-03 DIAGNOSIS — E559 Vitamin D deficiency, unspecified: Secondary | ICD-10-CM | POA: Diagnosis not present

## 2018-03-03 DIAGNOSIS — F322 Major depressive disorder, single episode, severe without psychotic features: Secondary | ICD-10-CM | POA: Diagnosis not present

## 2018-03-16 DIAGNOSIS — Z23 Encounter for immunization: Secondary | ICD-10-CM | POA: Diagnosis not present

## 2018-07-23 ENCOUNTER — Other Ambulatory Visit: Payer: Self-pay | Admitting: Family Medicine

## 2018-07-23 DIAGNOSIS — Z1231 Encounter for screening mammogram for malignant neoplasm of breast: Secondary | ICD-10-CM

## 2018-08-05 DIAGNOSIS — D3132 Benign neoplasm of left choroid: Secondary | ICD-10-CM | POA: Diagnosis not present

## 2018-08-05 DIAGNOSIS — H401131 Primary open-angle glaucoma, bilateral, mild stage: Secondary | ICD-10-CM | POA: Diagnosis not present

## 2018-08-05 DIAGNOSIS — Z7984 Long term (current) use of oral hypoglycemic drugs: Secondary | ICD-10-CM | POA: Diagnosis not present

## 2018-08-05 DIAGNOSIS — E119 Type 2 diabetes mellitus without complications: Secondary | ICD-10-CM | POA: Diagnosis not present

## 2018-08-05 DIAGNOSIS — H2513 Age-related nuclear cataract, bilateral: Secondary | ICD-10-CM | POA: Diagnosis not present

## 2018-08-06 DIAGNOSIS — J988 Other specified respiratory disorders: Secondary | ICD-10-CM | POA: Diagnosis not present

## 2018-08-23 DIAGNOSIS — R509 Fever, unspecified: Secondary | ICD-10-CM | POA: Diagnosis not present

## 2018-08-23 DIAGNOSIS — B349 Viral infection, unspecified: Secondary | ICD-10-CM | POA: Diagnosis not present

## 2018-08-26 ENCOUNTER — Ambulatory Visit: Payer: Medicare Other

## 2018-09-02 DIAGNOSIS — F322 Major depressive disorder, single episode, severe without psychotic features: Secondary | ICD-10-CM | POA: Diagnosis not present

## 2018-09-02 DIAGNOSIS — E7800 Pure hypercholesterolemia, unspecified: Secondary | ICD-10-CM | POA: Diagnosis not present

## 2018-09-02 DIAGNOSIS — E1165 Type 2 diabetes mellitus with hyperglycemia: Secondary | ICD-10-CM | POA: Diagnosis not present

## 2018-09-02 DIAGNOSIS — R0989 Other specified symptoms and signs involving the circulatory and respiratory systems: Secondary | ICD-10-CM | POA: Diagnosis not present

## 2018-09-02 DIAGNOSIS — I10 Essential (primary) hypertension: Secondary | ICD-10-CM | POA: Diagnosis not present

## 2018-09-02 DIAGNOSIS — K219 Gastro-esophageal reflux disease without esophagitis: Secondary | ICD-10-CM | POA: Diagnosis not present

## 2018-09-09 DIAGNOSIS — E1165 Type 2 diabetes mellitus with hyperglycemia: Secondary | ICD-10-CM | POA: Diagnosis not present

## 2018-09-09 DIAGNOSIS — N289 Disorder of kidney and ureter, unspecified: Secondary | ICD-10-CM | POA: Diagnosis not present

## 2018-09-22 DIAGNOSIS — N289 Disorder of kidney and ureter, unspecified: Secondary | ICD-10-CM | POA: Diagnosis not present

## 2018-09-29 ENCOUNTER — Ambulatory Visit: Payer: Medicare Other

## 2018-10-29 DIAGNOSIS — H52223 Regular astigmatism, bilateral: Secondary | ICD-10-CM | POA: Diagnosis not present

## 2018-10-29 DIAGNOSIS — H524 Presbyopia: Secondary | ICD-10-CM | POA: Diagnosis not present

## 2018-10-29 DIAGNOSIS — H5213 Myopia, bilateral: Secondary | ICD-10-CM | POA: Diagnosis not present

## 2018-10-29 DIAGNOSIS — H401131 Primary open-angle glaucoma, bilateral, mild stage: Secondary | ICD-10-CM | POA: Diagnosis not present

## 2018-11-10 ENCOUNTER — Ambulatory Visit: Payer: Medicare Other

## 2018-11-10 ENCOUNTER — Other Ambulatory Visit: Payer: Self-pay

## 2018-11-10 ENCOUNTER — Ambulatory Visit
Admission: RE | Admit: 2018-11-10 | Discharge: 2018-11-10 | Disposition: A | Discharge status: Home / Self Care | Payer: Medicare Other | Source: Ambulatory Visit | Attending: Family Medicine | Admitting: Family Medicine

## 2018-11-10 DIAGNOSIS — Z1231 Encounter for screening mammogram for malignant neoplasm of breast: Secondary | ICD-10-CM

## 2018-11-10 IMAGING — MG DIGITAL SCREENING BILATERAL MAMMOGRAM WITH TOMO AND CAD
8 series · 8 of 24 positions shown · non-contrast
Comparison: Previous exam(s).

CLINICAL DATA: Screening.

EXAM:
DIGITAL SCREENING BILATERAL MAMMOGRAM WITH TOMO AND CAD

[L CC synth-2D]
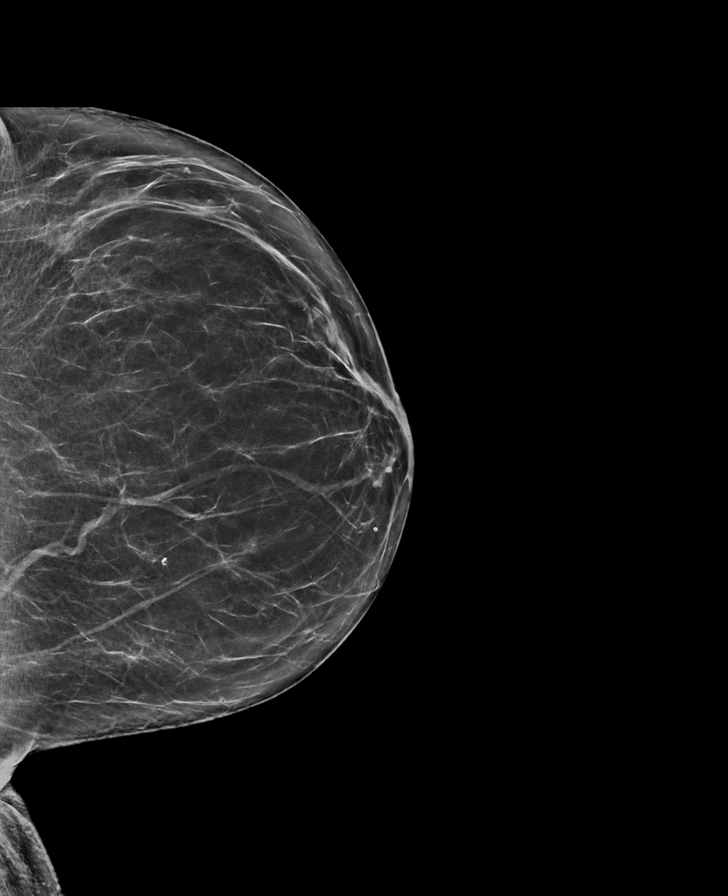

[R CC synth-2D]
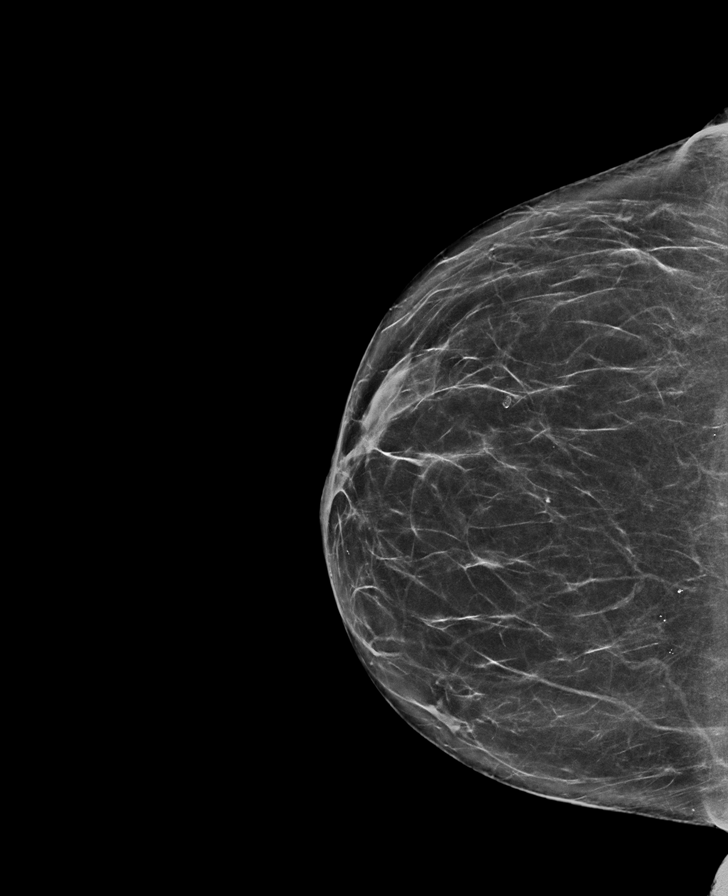

[L MLO synth-2D]
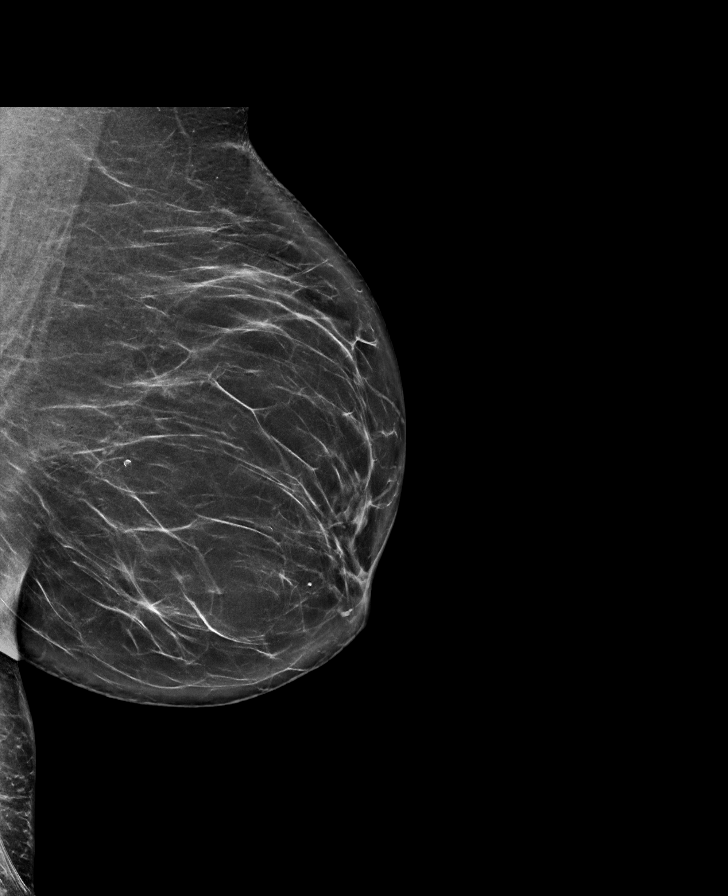

[R MLO synth-2D]
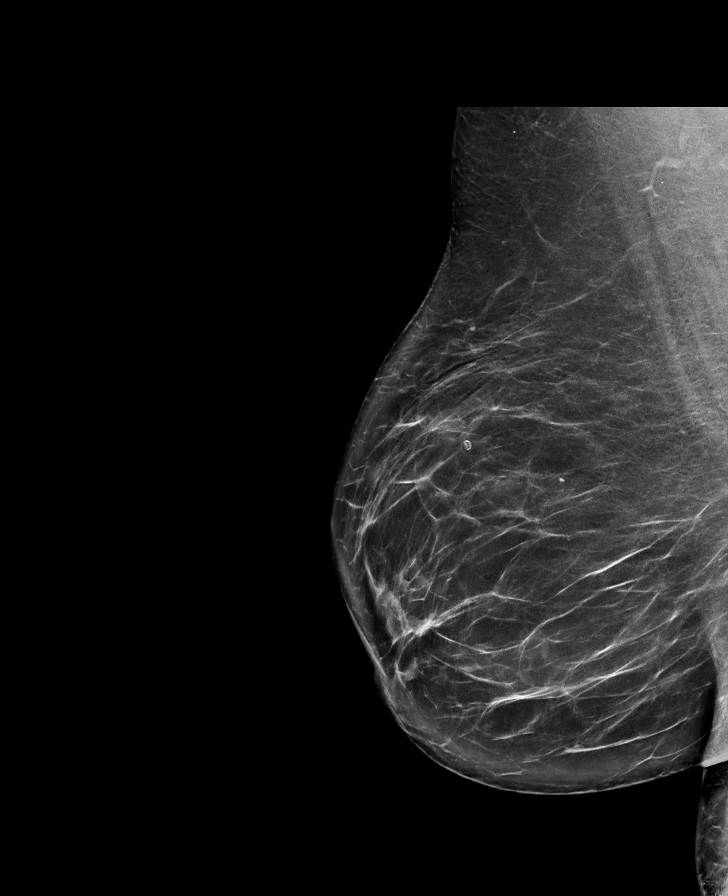

[L MLO tomo · tomo slice 39/76.0]
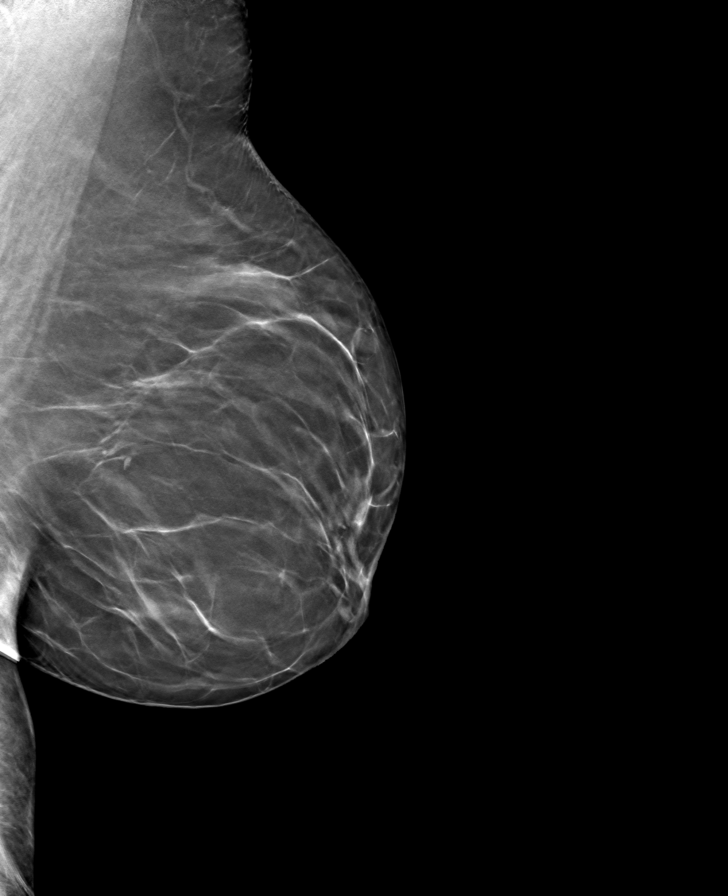

[R CC tomo · tomo slice 37/72.0]
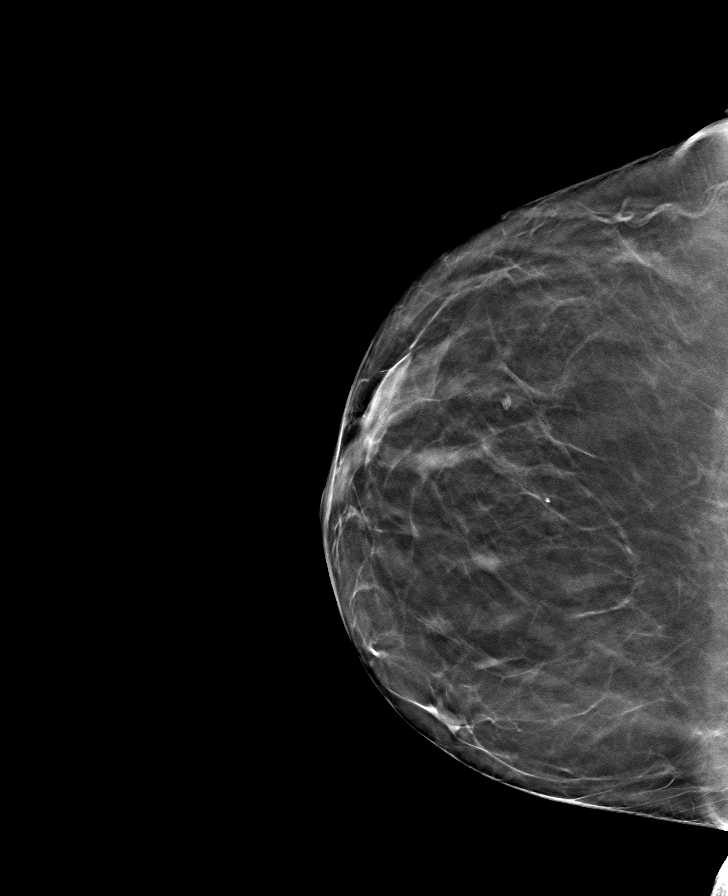

[R MLO tomo · tomo slice 43/86.0]
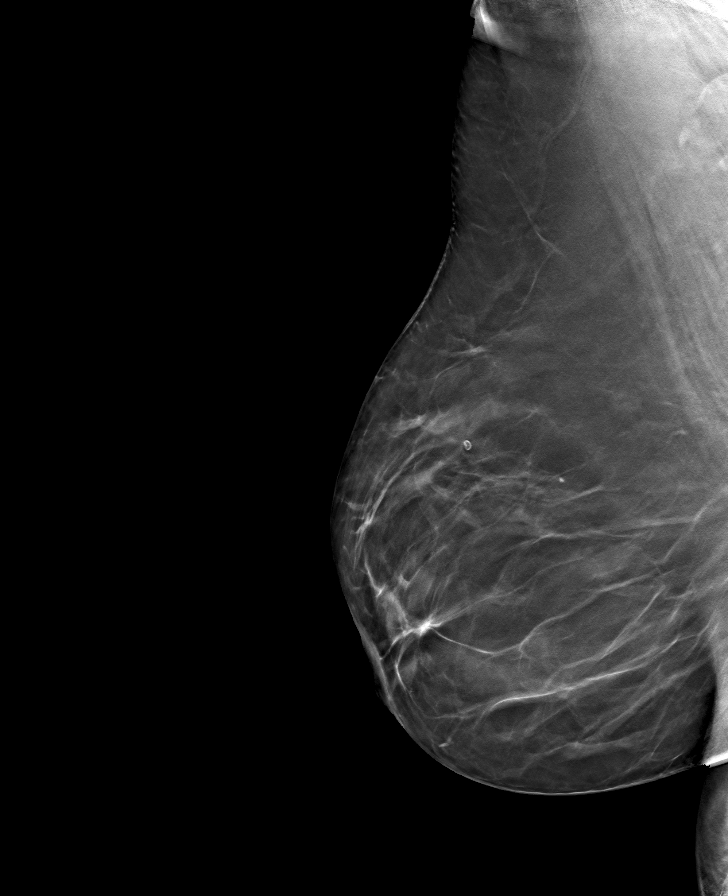

[L CC tomo · tomo slice 33/65.0]
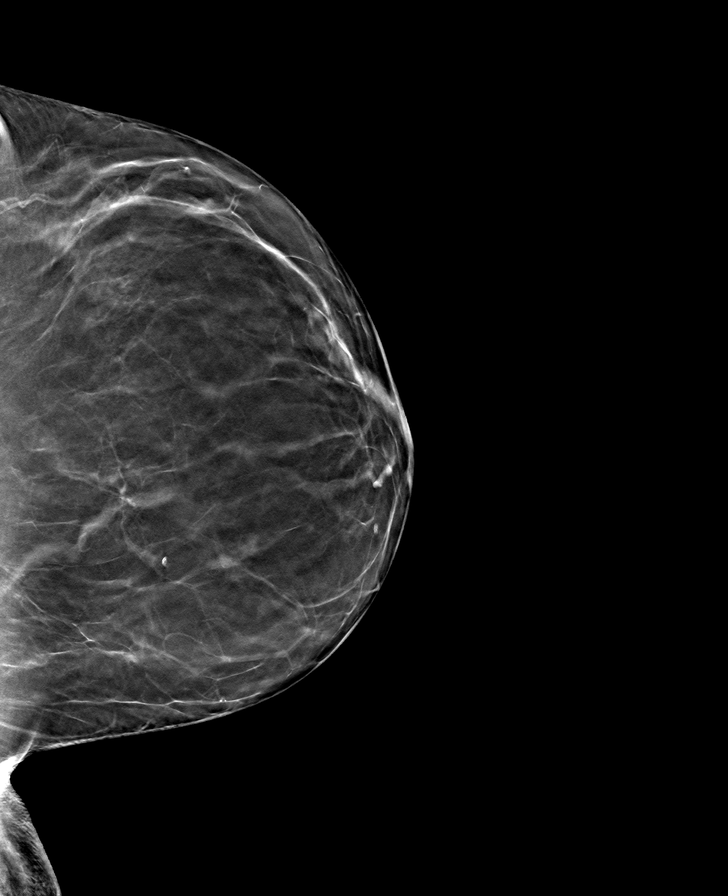

[8 of 24 positions shown; findings below may reference images not displayed]

ACR Breast Density Category b: There are scattered areas of
fibroglandular density.
FINDINGS: There are no findings suspicious for malignancy. Images were
processed with CAD.
IMPRESSION: No mammographic evidence of malignancy. A result letter of this
screening mammogram will be mailed directly to the patient.

RECOMMENDATION:
Screening mammogram in one year. (Code:[TQ])

BI-RADS CATEGORY  1: Negative.

## 2019-01-02 ENCOUNTER — Telehealth: Payer: Self-pay | Admitting: *Deleted

## 2019-01-05 DIAGNOSIS — I10 Essential (primary) hypertension: Secondary | ICD-10-CM | POA: Diagnosis not present

## 2019-01-05 DIAGNOSIS — E1165 Type 2 diabetes mellitus with hyperglycemia: Secondary | ICD-10-CM | POA: Diagnosis not present

## 2019-01-05 DIAGNOSIS — L309 Dermatitis, unspecified: Secondary | ICD-10-CM | POA: Diagnosis not present

## 2019-01-05 NOTE — Telephone Encounter (Signed)
ok 

## 2019-01-07 NOTE — Telephone Encounter (Signed)
Pt is scheduled 01/21/2019 with driver.

## 2019-01-07 NOTE — Telephone Encounter (Signed)
Called pt insurance at [phone] and no pa is needed for [phone] due to pt having medicare as primary coverage.

## 2019-01-21 ENCOUNTER — Encounter: Payer: Self-pay | Admitting: Physical Medicine and Rehabilitation

## 2019-01-21 ENCOUNTER — Ambulatory Visit: Payer: Self-pay

## 2019-01-21 ENCOUNTER — Ambulatory Visit (INDEPENDENT_AMBULATORY_CARE_PROVIDER_SITE_OTHER): Payer: Medicare Other | Admitting: Physical Medicine and Rehabilitation

## 2019-01-21 VITALS — BP 128/80 | HR 74

## 2019-01-21 DIAGNOSIS — M47816 Spondylosis without myelopathy or radiculopathy, lumbar region: Secondary | ICD-10-CM

## 2019-01-21 MED ORDER — METHYLPREDNISOLONE ACETATE 80 MG/ML IJ SUSP
80.0000 mg | Freq: Once | INTRAMUSCULAR | Status: AC
  Administered 2019-01-21: 80 mg

## 2019-01-21 NOTE — Progress Notes (Signed)
 .  Numeric Pain Rating Scale and Functional Assessment Average Pain 8   In the last MONTH (on 0-10 scale) has pain interfered with the following?  1. General activity like being  able to carry out your everyday physical activities such as walking, climbing stairs, carrying groceries, or moving a chair?  Rating(8)   +Driver, -BT, -Dye Allergies.  

## 2019-01-28 DIAGNOSIS — H401111 Primary open-angle glaucoma, right eye, mild stage: Secondary | ICD-10-CM | POA: Diagnosis not present

## 2019-03-09 NOTE — Progress Notes (Signed)
Michaela Santana - 73 y.o. female MRN [id number]  Date of birth: May 25, 1946  Office Visit Note: Visit Date: 01/21/2019 PCP: [name], MD Referred by: [name], MD  Subjective: Chief Complaint  Patient presents with  . Lower Back - Pain  . Left Arm - Pain   HPI: Michaela Santana is a 73 y.o. female who comes in today For planned bilateral L4-5 and L5-S1 facet joint blocks.  She is followed by Dr. Jean Rosenthal in our office.  We first saw her in the early part of the spring 2018 and completed facet joint blocks at L5-S1 and L4-5 with good relief more than 50 to 60% relief up until recently.  Then I saw her in June of last year with very similar symptoms and we repeated the injection with 80% relief.  She is again worsening axial low back pain some referral into the lateral hips but nothing past the knee and no paresthesias.  She has had no new trauma.  She reports walking and standing make the pain worse and Advil does ease it some.  We are going to repeat facet joint blocks at L4-5 and L5-S1.  Depending on relief would look potentially at radiofrequency ablation of these joints.  She has had good relief with the intra-articular injections but if these are short-lived we would look at diagnostic blocks and radiofrequency ablation.  She has had physical therapy would regroup with that if needed.  Medications are really ineffectual.  She has no red flag complaints.  ROS Otherwise per HPI.  Assessment & Plan: Visit Diagnoses:  1. Spondylosis without myelopathy or radiculopathy, lumbar region     Plan: No additional findings.   Meds & Orders:  Meds ordered this encounter  Medications  . methylPREDNISolone acetate (DEPO-MEDROL) injection 80 mg    Orders Placed This Encounter  Procedures  . Facet Injection  . XR C-ARM NO REPORT    Follow-up: Return if symptoms worsen or fail to improve.   Procedures: No procedures performed  Lumbar Facet Joint Intra-Articular Injection(s)  with Fluoroscopic Guidance  Patient: Michaela Santana      Date of Birth: 1946-03-12 MRN: [id number] PCP: [name], MD      Visit Date: 01/21/2019   Universal Protocol:    Date/Time: 01/21/2019  Consent Given By: the patient  Position: PRONE   Additional Comments: Vital signs were monitored before and after the procedure. Patient was prepped and draped in the usual sterile fashion. The correct patient, procedure, and site was verified.   Injection Procedure Details:  Procedure Site One Meds Administered:  Meds ordered this encounter  Medications  . methylPREDNISolone acetate (DEPO-MEDROL) injection 80 mg     Laterality: Bilateral  Location/Site:  L4-L5 L5-S1  Needle size: 22 guage  Needle type: Spinal  Needle Placement: Articular  Findings:  -Comments: Excellent flow of contrast producing a partial arthrogram.  Procedure Details: The fluoroscope beam is vertically oriented in AP, and the inferior recess is visualized beneath the lower pole of the inferior apophyseal process, which represents the target point for needle insertion. When direct visualization is difficult the target point is located at the medial projection of the vertebral pedicle. The region overlying each aforementioned target is locally anesthetized with a 1 to 2 ml. volume of 1% Lidocaine without Epinephrine.   The spinal needle was inserted into each of the above mentioned facet joints using biplanar fluoroscopic guidance. A 0.25 to 0.5 ml. volume of Isovue-250 was injected and a  partial facet joint arthrogram was obtained. A single spot film was obtained of the resulting arthrogram.    One to 1.25 ml of the steroid/anesthetic solution was then injected into each of the facet joints noted above.   Additional Comments:  The patient tolerated the procedure well Dressing: 2 x 2 sterile gauze and Band-Aid    Post-procedure details: Patient was observed during the procedure. Post-procedure  instructions were reviewed.  Patient left the clinic in stable condition.    Clinical History: 07/16/2016  L3-L4: Preservation of disc height. Mild disc bulge and small broad-based lateral disc protrusions, left greater than right. Moderate facet arthrosis. Thickening of the ligamentum flavum. Mild central canal and left greater than right lateral recess  stenosis. Can't exclude impingement of the traversing L4 nerve roots, particularly on the left. There is also mild to moderate left foraminal stenosis with protruding disc material contacting the exiting left L3 nerve root. No significant right foraminal  stenosis. #L4-L5: Minimal loss of disc height. Generalized disc bulge and small broad-based right lateral disc protrusion. Moderate facet arthrosis with hypertrophic changes and small effusions. Thickening of the ligamentum flavum. Mild central canal/lateral  recess stenosis. Mild right foraminal stenosis with protruding disc material contacting the exiting right L4 nerve root. #L5-S1: Moderate to severe loss of disc height. Generalized disc bulge and marginal spurring. Small broad-based central disc protrusion. Moderate facet arthrosis. Mild to moderate central canal and moderate subarticular/lateral recess stenosis.  Possible impingement of the traversing S1 nerve roots. Moderate bilateral foraminal stenosis.   She reports that she has never smoked. She has never used smokeless tobacco. No results for input(s): HGBA1C, LABURIC in the last 8760 hours.  Objective:  VS:  HT:    WT:   BMI:     BP:128/80  HR:74bpm  TEMP: ( )  RESP:  Physical Exam  Ortho Exam Imaging: No results found.  Past Medical/Family/Surgical/Social History: Medications & Allergies reviewed per EMR, new medications updated. Patient Active Problem List   Diagnosis Date Noted  . Spondylosis without myelopathy or radiculopathy, lumbar region 10/17/2016  . CHF (congestive heart failure) ([place]) 07/17/2016  .  Sepsis ([place]) 07/17/2016  . Influenza 07/17/2016  . Congestive heart failure ([place])   . Hypotension   . Urinary tract infection with hematuria    Past Medical History:  Diagnosis Date  . Anemia   . 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 CHF (congestive heart failure) ([place]) 07/17/2016  . Depression   . Family history of adverse reaction to anesthesia    "her father had some issues when he had his gallbladder taken out when he was in his late 18s"  . GERD (gastroesophageal reflux disease)   . High cholesterol   . History of kidney stones   . Hypertension   . Type II diabetes mellitus (HCC)    Family History  Problem Relation Age of Onset  . Breast cancer Neg Hx    Past Surgical History:  Procedure Laterality Date  . BREAST BIOPSY     "? side; it wasn't anything"  . CHOLECYSTECTOMY OPEN     Social History   Occupational History  . Not on file  Tobacco Use  . Smoking status: Never Smoker  . Smokeless tobacco: Never Used  Substance and Sexual Activity  . Alcohol use: No  . Drug use: No  . Sexual activity: Not on file

## 2019-03-09 NOTE — Procedures (Signed)
Lumbar Facet Joint Intra-Articular Injection(s) with Fluoroscopic Guidance  Patient: Michaela Santana      Date of Birth: 1945-10-31 MRN: [id number] PCP: [name], MD      Visit Date: 01/21/2019   Universal Protocol:    Date/Time: 01/21/2019  Consent Given By: the patient  Position: PRONE   Additional Comments: Vital signs were monitored before and after the procedure. Patient was prepped and draped in the usual sterile fashion. The correct patient, procedure, and site was verified.   Injection Procedure Details:  Procedure Site One Meds Administered:  Meds ordered this encounter  Medications  . methylPREDNISolone acetate (DEPO-MEDROL) injection 80 mg     Laterality: Bilateral  Location/Site:  L4-L5 L5-S1  Needle size: 22 guage  Needle type: Spinal  Needle Placement: Articular  Findings:  -Comments: Excellent flow of contrast producing a partial arthrogram.  Procedure Details: The fluoroscope beam is vertically oriented in AP, and the inferior recess is visualized beneath the lower pole of the inferior apophyseal process, which represents the target point for needle insertion. When direct visualization is difficult the target point is located at the medial projection of the vertebral pedicle. The region overlying each aforementioned target is locally anesthetized with a 1 to 2 ml. volume of 1% Lidocaine without Epinephrine.   The spinal needle was inserted into each of the above mentioned facet joints using biplanar fluoroscopic guidance. A 0.25 to 0.5 ml. volume of Isovue-250 was injected and a partial facet joint arthrogram was obtained. A single spot film was obtained of the resulting arthrogram.    One to 1.25 ml of the steroid/anesthetic solution was then injected into each of the facet joints noted above.   Additional Comments:  The patient tolerated the procedure well Dressing: 2 x 2 sterile gauze and Band-Aid    Post-procedure details: Patient was  observed during the procedure. Post-procedure instructions were reviewed.  Patient left the clinic in stable condition.

## 2019-03-11 DIAGNOSIS — Z23 Encounter for immunization: Secondary | ICD-10-CM | POA: Diagnosis not present

## 2019-03-13 DIAGNOSIS — L570 Actinic keratosis: Secondary | ICD-10-CM | POA: Diagnosis not present

## 2019-03-13 DIAGNOSIS — L814 Other melanin hyperpigmentation: Secondary | ICD-10-CM | POA: Diagnosis not present

## 2019-03-13 DIAGNOSIS — L918 Other hypertrophic disorders of the skin: Secondary | ICD-10-CM | POA: Diagnosis not present

## 2019-03-13 DIAGNOSIS — D225 Melanocytic nevi of trunk: Secondary | ICD-10-CM | POA: Diagnosis not present

## 2019-03-13 DIAGNOSIS — D1801 Hemangioma of skin and subcutaneous tissue: Secondary | ICD-10-CM | POA: Diagnosis not present

## 2019-03-13 DIAGNOSIS — L438 Other lichen planus: Secondary | ICD-10-CM | POA: Diagnosis not present

## 2019-03-31 DIAGNOSIS — I10 Essential (primary) hypertension: Secondary | ICD-10-CM | POA: Diagnosis not present

## 2019-03-31 DIAGNOSIS — F322 Major depressive disorder, single episode, severe without psychotic features: Secondary | ICD-10-CM | POA: Diagnosis not present

## 2019-03-31 DIAGNOSIS — E1165 Type 2 diabetes mellitus with hyperglycemia: Secondary | ICD-10-CM | POA: Diagnosis not present

## 2019-03-31 DIAGNOSIS — Z23 Encounter for immunization: Secondary | ICD-10-CM | POA: Diagnosis not present

## 2019-03-31 DIAGNOSIS — K219 Gastro-esophageal reflux disease without esophagitis: Secondary | ICD-10-CM | POA: Diagnosis not present

## 2019-03-31 DIAGNOSIS — Z0000 Encounter for general adult medical examination without abnormal findings: Secondary | ICD-10-CM | POA: Diagnosis not present

## 2019-10-07 ENCOUNTER — Telehealth: Payer: Self-pay | Admitting: Physical Medicine and Rehabilitation

## 2019-10-07 NOTE — Telephone Encounter (Signed)
Patient is requesting an appointment for back pain. Bilateral L4-5 and L5-S1 facets 01/21/19. Ok to repeat if helped, same problem/side, and no new injury?

## 2019-10-07 NOTE — Telephone Encounter (Signed)
Yes but if any questions then OV

## 2019-10-08 NOTE — Telephone Encounter (Signed)
Needs auth. Scheduled for 5/18 with driver.

## 2019-10-13 NOTE — Telephone Encounter (Signed)
No prior authorization is required for Outpatient [facility] through [employer] for this member at this time. Please contact [employer] at the number on the back of the member's ID card. If this request is for Inpatient services, please change the Place of Service to Inpatient

## 2019-10-15 ENCOUNTER — Other Ambulatory Visit: Payer: Self-pay | Admitting: Family Medicine

## 2019-10-15 DIAGNOSIS — Z1231 Encounter for screening mammogram for malignant neoplasm of breast: Secondary | ICD-10-CM

## 2019-10-27 ENCOUNTER — Ambulatory Visit (INDEPENDENT_AMBULATORY_CARE_PROVIDER_SITE_OTHER): Payer: Medicare Other | Admitting: Physical Medicine and Rehabilitation

## 2019-10-27 ENCOUNTER — Ambulatory Visit: Payer: Self-pay

## 2019-10-27 ENCOUNTER — Other Ambulatory Visit: Payer: Self-pay

## 2019-10-27 ENCOUNTER — Encounter: Payer: Self-pay | Admitting: Physical Medicine and Rehabilitation

## 2019-10-27 VITALS — BP 146/86 | HR 64

## 2019-10-27 DIAGNOSIS — M47816 Spondylosis without myelopathy or radiculopathy, lumbar region: Secondary | ICD-10-CM | POA: Diagnosis not present

## 2019-10-27 MED ORDER — METHYLPREDNISOLONE ACETATE 80 MG/ML IJ SUSP
40.0000 mg | Freq: Once | INTRAMUSCULAR | Status: AC
  Administered 2019-10-27: 40 mg

## 2019-10-27 NOTE — Progress Notes (Signed)
Pt states pain across the lower back and radiates into the right thigh. Pt states last injection helped a lot on 01/21/19 and returned October 2020. Any movement makes pain worse. Tylenols helps with pain.   .Numeric Pain Rating Scale and Functional Assessment Average Pain 7   In the last MONTH (on 0-10 scale) has pain interfered with the following?  1. General activity like being  able to carry out your everyday physical activities such as walking, climbing stairs, carrying groceries, or moving a chair?  Rating(8)   +Driver, -BT, -Dye Allergies.

## 2019-10-28 NOTE — Procedures (Signed)
Lumbar Diagnostic Facet Joint Nerve Block with Fluoroscopic Guidance   Patient: Michaela Santana      Date of Birth: 08/28/1945 MRN: [id number] PCP: [name], MD      Visit Date: 10/27/2019   Universal Protocol:    Date/Time: 05/19/215:34 AM  Consent Given By: the patient  Position: PRONE  Additional Comments: Vital signs were monitored before and after the procedure. Patient was prepped and draped in the usual sterile fashion. The correct patient, procedure, and site was verified.   Injection Procedure Details:  Procedure Site One Meds Administered:  Meds ordered this encounter  Medications  . methylPREDNISolone acetate (DEPO-MEDROL) injection 40 mg     Laterality: Bilateral  Location/Site:  L4-L5 L5-S1  Needle size: 22 ga.  Needle type:spinal  Needle Placement: Oblique pedical  Findings:   -Comments: There was excellent flow of contrast along the articular pillars without intravascular flow.  Procedure Details: The fluoroscope beam is vertically oriented in AP and then obliqued 15 to 20 degrees to the ipsilateral side of the desired nerve to achieve the "Scotty dog" appearance.  The skin over the target area of the junction of the superior articulating process and the transverse process (sacral ala if blocking the L5 dorsal rami) was locally anesthetized with a 1 ml volume of 1% Lidocaine without Epinephrine.  The spinal needle was inserted and advanced in a trajectory view down to the target.   After contact with periosteum and negative aspirate for blood and CSF, correct placement without intravascular or epidural spread was confirmed by injecting 0.5 ml. of Isovue-250.  A spot radiograph was obtained of this image.    Next, a 0.5 ml. volume of the injectate described above was injected. The needle was then redirected to the other facet joint nerves mentioned above if needed.  Prior to the procedure, the patient was given a Pain Diary which was completed for  baseline measurements.  After the procedure, the patient rated their pain every 30 minutes and will continue rating at this frequency for a total of 5 hours.  The patient has been asked to complete the Diary and return to [place] by mail, fax or hand delivered as soon as possible.   Additional Comments:  The patient tolerated the procedure well Dressing: 2 x 2 sterile gauze and Band-Aid    Post-procedure details: Patient was observed during the procedure. Post-procedure instructions were reviewed.  Patient left the clinic in stable condition.

## 2019-10-28 NOTE — Progress Notes (Signed)
Michaela Santana - 74 y.o. female MRN [id number]  Date of birth: 29-Aug-1945  Office Visit Note: Visit Date: 10/27/2019 PCP: [name], MD Referred by: [name], MD  Subjective: Chief Complaint  Patient presents with  . Lower Back - Pain  . Right Thigh - Pain   HPI:  Michaela Santana is a 74 y.o. female who comes in today for planned Bilateral L4-L5 and L5-S1 lumbar facet/medial branch block with fluoroscopic guidance.  The patient has failed conservative care including home exercise, medications, time and activity modification.  This injection will be diagnostic and hopefully therapeutic.  Please see requesting physician notes for further details and justification.  Exam shows concordant low back pain with facet joint loading and extension.  She is present with her daughter today provides some of the history. She has mainly low back pain with occasional referral into the thigh without paresthesia not past the knee. Her case is complicated by diabetes and heart disease. She does get some symptoms in the feet bilaterally but has not had electrodiagnostic testing or thorough work-up for peripheral neuropathy. In the past she has done well with intra-articular facet joint injections. These used to last for quite a while. She is very averse to injections and is really puts any sort of treatment that requires an injection until really she is very painful. She is having axial back pain today worse with standing and turning. Exam is consistent with facet mediated pain with facet loading. She has negative slump test but good distal strength. We will complete diagnostic medial branch blocks and look at radiofrequency ablation for more definitive treatment of her back. Depending on relief could look at updated MRI of the lumbar spine. Prior MRI was in 2018 not showing any great deal of stenosis.   ROS Otherwise per HPI.  Assessment & Plan: Visit Diagnoses:  1. Spondylosis without myelopathy or  radiculopathy, lumbar region     Plan: No additional findings.   Meds & Orders:  Meds ordered this encounter  Medications  . methylPREDNISolone acetate (DEPO-MEDROL) injection 40 mg    Orders Placed This Encounter  Procedures  . Facet Injection  . XR C-ARM NO REPORT    Follow-up: No follow-ups on file.   Procedures: No procedures performed  Lumbar Diagnostic Facet Joint Nerve Block with Fluoroscopic Guidance   Patient: Michaela Santana      Date of Birth: 1946-03-27 MRN: [id number] PCP: [name], MD      Visit Date: 10/27/2019   Universal Protocol:    Date/Time: 05/19/215:34 AM  Consent Given By: the patient  Position: PRONE  Additional Comments: Vital signs were monitored before and after the procedure. Patient was prepped and draped in the usual sterile fashion. The correct patient, procedure, and site was verified.   Injection Procedure Details:  Procedure Site One Meds Administered:  Meds ordered this encounter  Medications  . methylPREDNISolone acetate (DEPO-MEDROL) injection 40 mg     Laterality: Bilateral  Location/Site:  L4-L5 L5-S1  Needle size: 22 ga.  Needle type:spinal  Needle Placement: Oblique pedical  Findings:   -Comments: There was excellent flow of contrast along the articular pillars without intravascular flow.  Procedure Details: The fluoroscope beam is vertically oriented in AP and then obliqued 15 to 20 degrees to the ipsilateral side of the desired nerve to achieve the "Scotty dog" appearance.  The skin over the target area of the junction of the superior articulating process and the transverse process (sacral ala if  blocking the L5 dorsal rami) was locally anesthetized with a 1 ml volume of 1% Lidocaine without Epinephrine.  The spinal needle was inserted and advanced in a trajectory view down to the target.   After contact with periosteum and negative aspirate for blood and CSF, correct placement without intravascular or  epidural spread was confirmed by injecting 0.5 ml. of Isovue-250.  A spot radiograph was obtained of this image.    Next, a 0.5 ml. volume of the injectate described above was injected. The needle was then redirected to the other facet joint nerves mentioned above if needed.  Prior to the procedure, the patient was given a Pain Diary which was completed for baseline measurements.  After the procedure, the patient rated their pain every 30 minutes and will continue rating at this frequency for a total of 5 hours.  The patient has been asked to complete the Diary and return to [place] by mail, fax or hand delivered as soon as possible.   Additional Comments:  The patient tolerated the procedure well Dressing: 2 x 2 sterile gauze and Band-Aid    Post-procedure details: Patient was observed during the procedure. Post-procedure instructions were reviewed.  Patient left the clinic in stable condition.    Clinical History: 07/16/2016  L3-L4: Preservation of disc height. Mild disc bulge and small broad-based lateral disc protrusions, left greater than right. Moderate facet arthrosis. Thickening of the ligamentum flavum. Mild central canal and left greater than right lateral recess  stenosis. Can't exclude impingement of the traversing L4 nerve roots, particularly on the left. There is also mild to moderate left foraminal stenosis with protruding disc material contacting the exiting left L3 nerve root. No significant right foraminal  stenosis. #L4-L5: Minimal loss of disc height. Generalized disc bulge and small broad-based right lateral disc protrusion. Moderate facet arthrosis with hypertrophic changes and small effusions. Thickening of the ligamentum flavum. Mild central canal/lateral  recess stenosis. Mild right foraminal stenosis with protruding disc material contacting the exiting right L4 nerve root. #L5-S1: Moderate to severe loss of disc height. Generalized disc bulge and marginal spurring.  Small broad-based central disc protrusion. Moderate facet arthrosis. Mild to moderate central canal and moderate subarticular/lateral recess stenosis.  Possible impingement of the traversing S1 nerve roots. Moderate bilateral foraminal stenosis.     Objective:  VS:  HT:    WT:   BMI:     BP:(!) 146/86  HR:64bpm  TEMP: ( )  RESP:  Physical Exam Constitutional:      General: She is not in acute distress.    Appearance: Normal appearance. She is not ill-appearing.  HENT:     Head: Normocephalic and atraumatic.     Right Ear: External ear normal.     Left Ear: External ear normal.  Eyes:     Extraocular Movements: Extraocular movements intact.  Cardiovascular:     Rate and Rhythm: Normal rate.     Pulses: Normal pulses.  Musculoskeletal:     Right lower leg: No edema.     Left lower leg: No edema.     Comments: Patient has good distal strength with no pain over the greater trochanters.  No clonus or focal weakness. No pain with hip rotation.  Skin:    Findings: No erythema, lesion or rash.  Neurological:     General: No focal deficit present.     Mental Status: She is alert and oriented to person, place, and time.     Sensory: No sensory deficit.  Motor: No weakness or abnormal muscle tone.     Coordination: Coordination normal.  Psychiatric:        Mood and Affect: Mood normal.        Behavior: Behavior normal.      Imaging: XR C-ARM NO REPORT  Result Date: 10/27/2019 Please see Notes tab for imaging impression.

## 2019-11-12 ENCOUNTER — Other Ambulatory Visit: Payer: Self-pay

## 2019-11-12 ENCOUNTER — Ambulatory Visit
Admission: RE | Admit: 2019-11-12 | Discharge: 2019-11-12 | Disposition: A | Discharge status: Home / Self Care | Payer: Medicare Other | Source: Ambulatory Visit | Attending: Family Medicine | Admitting: Family Medicine

## 2019-11-12 DIAGNOSIS — Z1231 Encounter for screening mammogram for malignant neoplasm of breast: Secondary | ICD-10-CM

## 2019-11-12 IMAGING — MG DIGITAL SCREENING BILAT W/ TOMO W/ CAD
8 series · 8 of 24 positions shown · non-contrast
Comparison: Previous exam(s).

CLINICAL DATA: Screening.

EXAM:
DIGITAL SCREENING BILATERAL MAMMOGRAM WITH TOMO AND CAD

[R CC synth-2D]
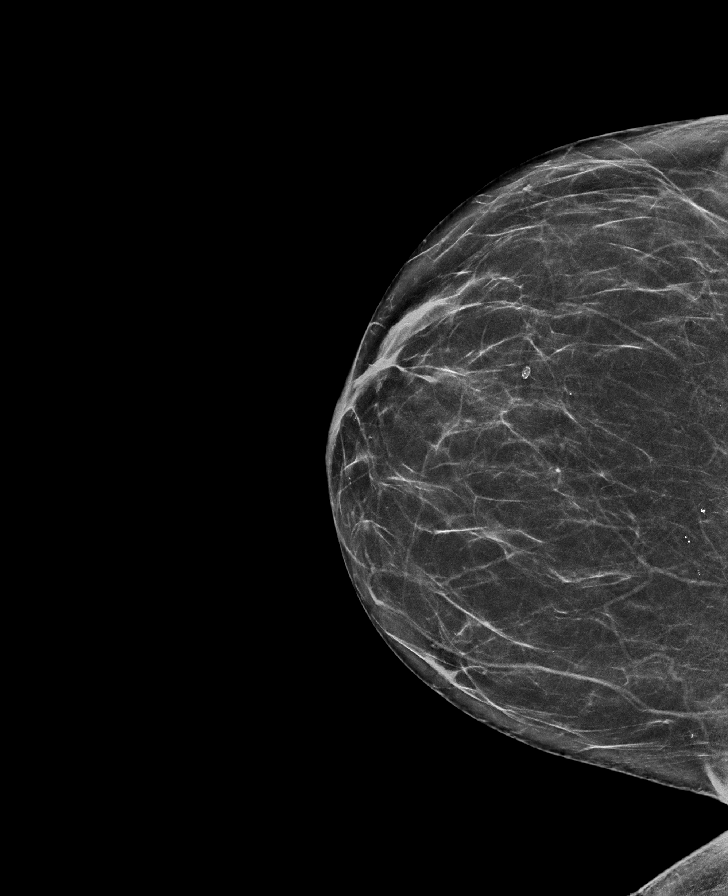

[R MLO synth-2D]
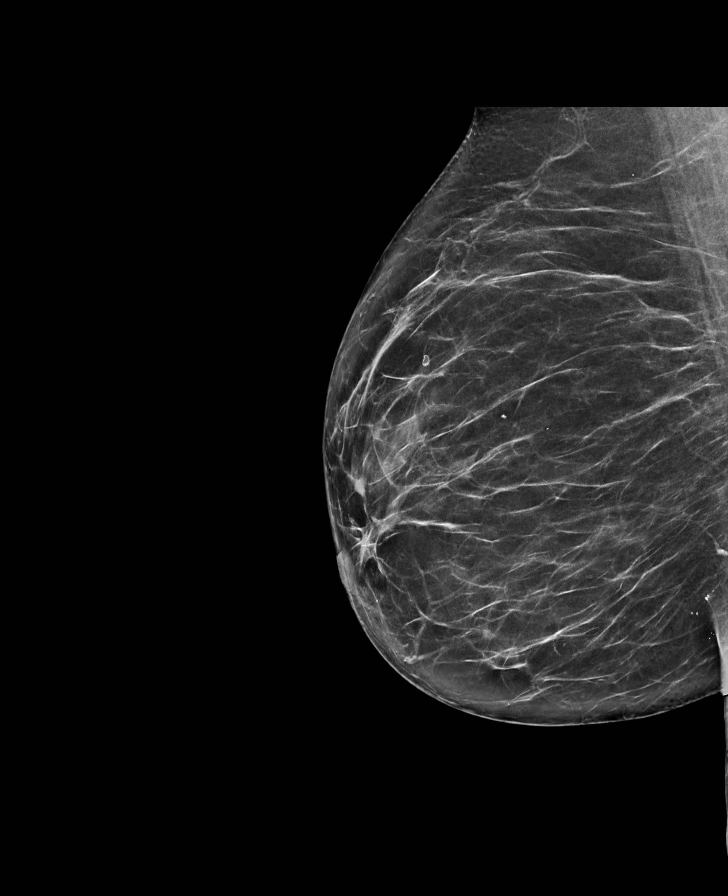

[L MLO synth-2D]
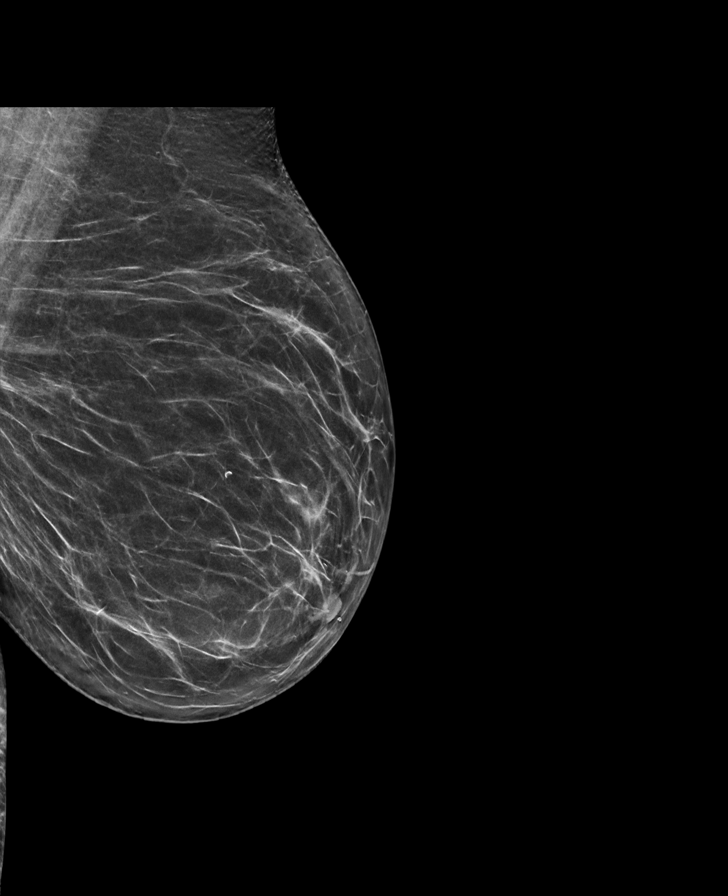

[L CC synth-2D]
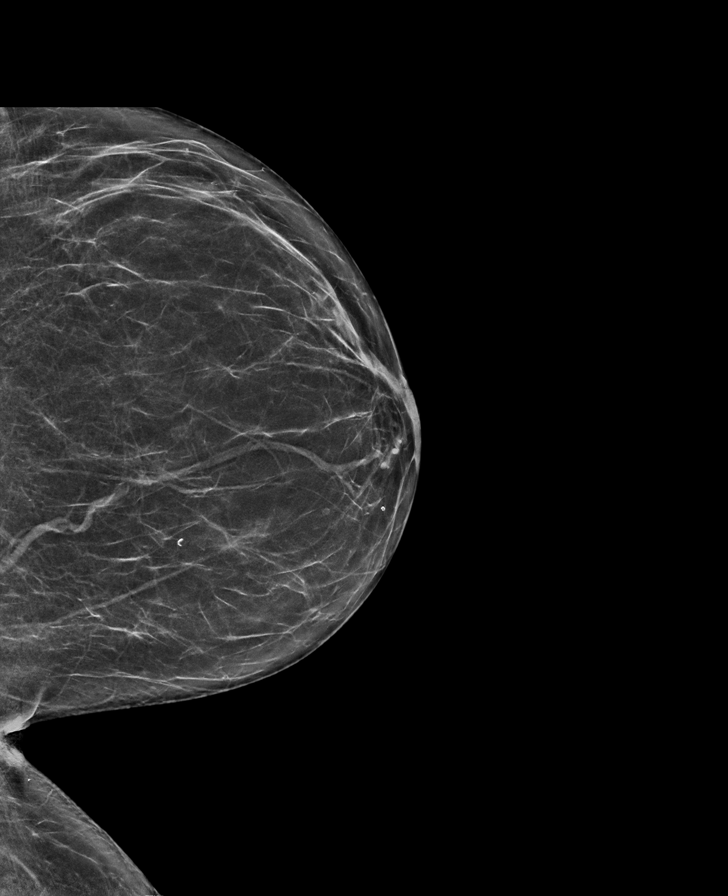

[L MLO tomo · tomo slice 36/71.0]
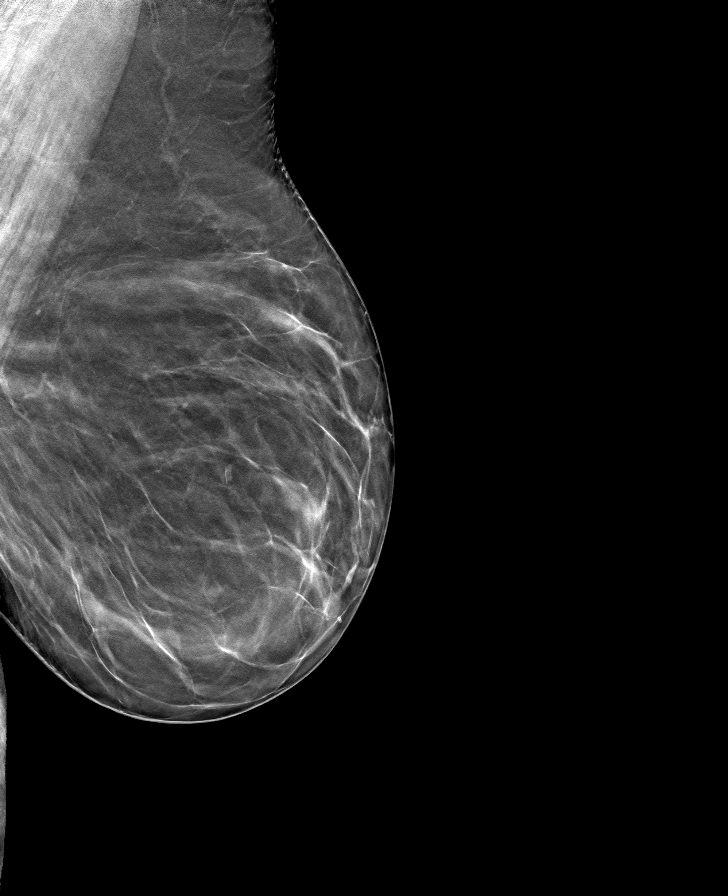

[R MLO tomo · tomo slice 37/74.0]
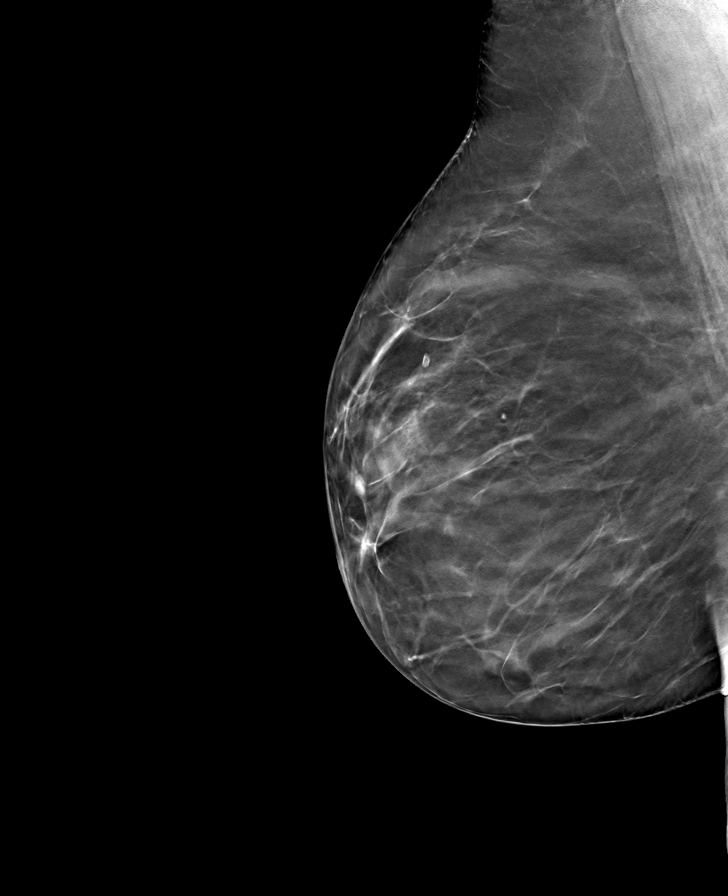

[L CC tomo · tomo slice 31/61.0]
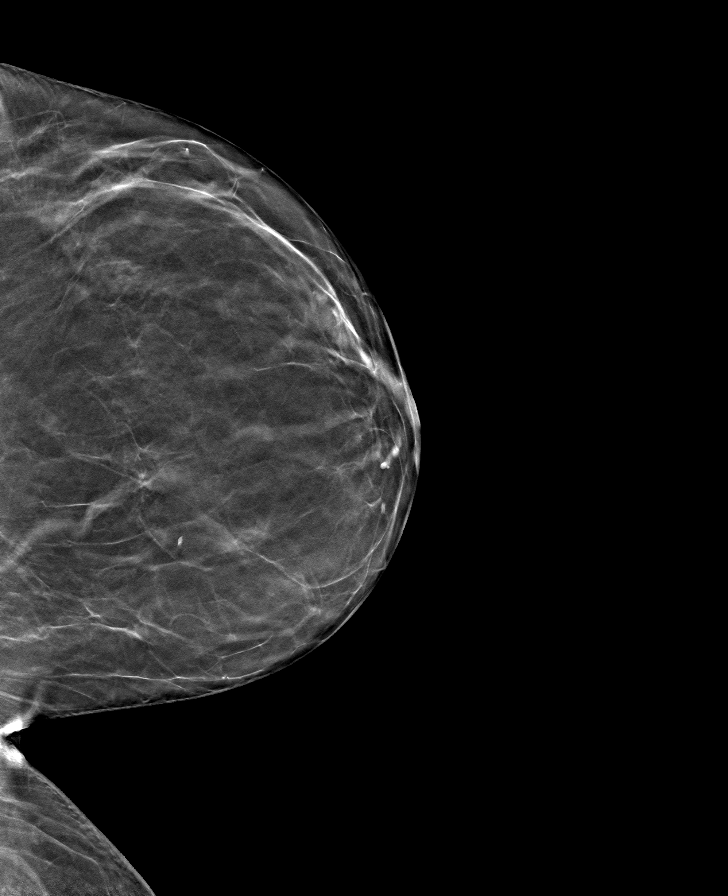

[R CC tomo · tomo slice 33/64.0]
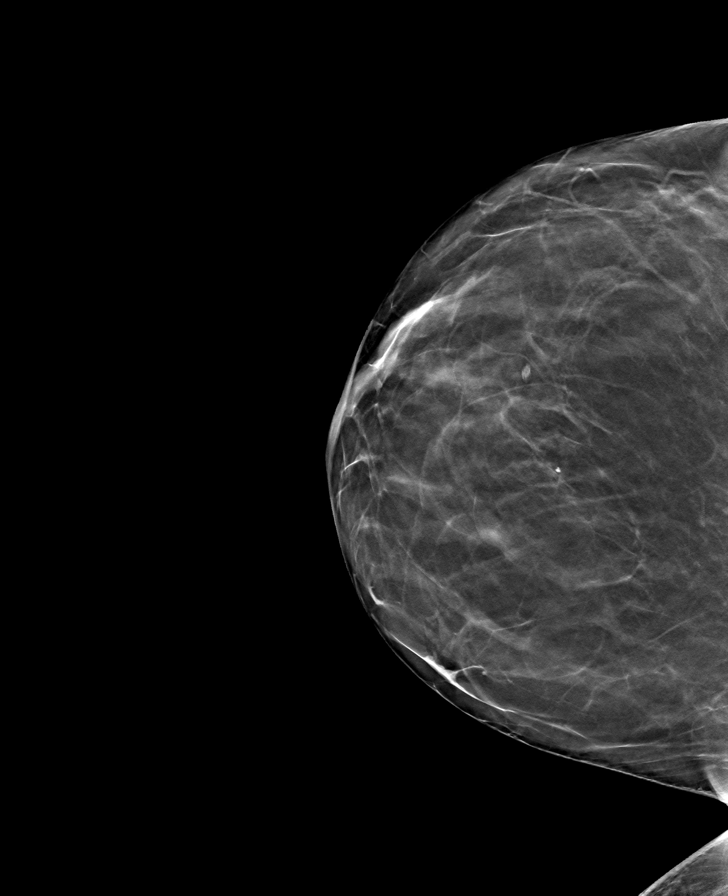

[8 of 24 positions shown; findings below may reference images not displayed]

ACR Breast Density Category b: There are scattered areas of
fibroglandular density.
FINDINGS: There are no findings suspicious for malignancy. Images were
processed with CAD.
IMPRESSION: No mammographic evidence of malignancy. A result letter of this
screening mammogram will be mailed directly to the patient.

RECOMMENDATION:
Screening mammogram in one year. (Code:[TQ])

BI-RADS CATEGORY  1: Negative.

## 2020-02-04 ENCOUNTER — Encounter: Payer: Self-pay | Admitting: *Deleted

## 2020-02-08 ENCOUNTER — Ambulatory Visit (INDEPENDENT_AMBULATORY_CARE_PROVIDER_SITE_OTHER): Payer: Medicare Other | Admitting: Diagnostic Neuroimaging

## 2020-02-08 ENCOUNTER — Encounter: Payer: Self-pay | Admitting: Diagnostic Neuroimaging

## 2020-02-08 VITALS — BP 110/70 | HR 68 | Ht 62.0 in | Wt 177.2 lb

## 2020-02-08 DIAGNOSIS — H9201 Otalgia, right ear: Secondary | ICD-10-CM | POA: Diagnosis not present

## 2020-02-08 NOTE — Patient Instructions (Signed)
RIGHT EAR ACHING (in setting of right mastoid pain, allergies, rhinorrhea) - symptoms resolved after 1 week - monitor for now

## 2020-02-08 NOTE — Progress Notes (Signed)
[facility]  PATIENT: Michaela Santana DOB: 04/16/1946  REFERRING CLINICIAN: [name], * HISTORY FROM: patient and daughter  REASON FOR VISIT: new consult    HISTORICAL  CHIEF COMPLAINT:  Chief Complaint  Patient presents with  . Possible trigeminal neuralgia    rm 6 New Pt, dgtr- Leigh "right ear was hurting, not an infection, better now with Gabapentin"     HISTORY OF PRESENT ILLNESS:   74 year old female here for evaluation of right ear pain.  July 2021 patient had onset of right ear aching and pressure sensation.  She had some discomfort behind her right ear.  She was having some allergy symptoms, nasal discharge at the same time.  Patient went to PCP for evaluation and major ear infection was ruled out.  She was tried on gabapentin and referred here for further evaluation.  After approximately 1 week symptoms have resolved.  Patient continues on gabapentin which also helps some of her back and neck pain issues.  No numbness or tingling.  No vision changes.  No slurred speech.  No problems with arms or legs.   REVIEW OF SYSTEMS: Full 14 system review of systems performed and negative with exception of:  As per HPi.   ALLERGIES: Allergies  Allergen Reactions  . Codeine Hypertension  . Alprazolam Other (See Comments)    lethargic    HOME MEDICATIONS: Outpatient Medications Prior to Visit  Medication Sig Dispense Refill  . amLODipine (NORVASC) 5 MG tablet Take 5 mg by mouth daily.    Marland Kitchen aspirin 81 MG chewable tablet Chew 81 mg by mouth daily.    Marland Kitchen atorvastatin (LIPITOR) 10 MG tablet Take 10 mg by mouth daily.    . Blood Glucose Monitoring Suppl (FREESTYLE LITE) DEVI USE UTD  0  . Calcium Carbonate-Vitamin D 600-200 MG-UNIT TABS Take 1 tablet by mouth 2 (two) times daily.    . cetirizine (ZYRTEC) 10 MG chewable tablet Chew 10 mg by mouth daily.    . fluticasone (FLONASE) 50 MCG/ACT nasal spray Place 2 sprays into both nostrils daily as  needed for allergies or rhinitis.    Marland Kitchen FREESTYLE LITE test strip USE TO CHECK FASTING GLUCOSE IN THE MORNING AND 2 HOURS AFTER DINNER OR LUNCH  3  . gabapentin (NEURONTIN) 300 MG capsule Take 300 mg by mouth daily.    Marland Kitchen glipiZIDE (GLUCOTROL XL) 5 MG 24 hr tablet     . ipratropium (ATROVENT) 0.06 % nasal spray Place 1 spray into both nostrils daily as needed for allergies.    . Lancets (FREESTYLE) lancets     . lansoprazole (PREVACID) 30 MG capsule Take 30 mg by mouth daily at 12 noon.    Marland Kitchen losartan-hydrochlorothiazide (HYZAAR) 100-25 MG tablet Take 1 tablet by mouth daily.    . magnesium oxide (MAG-OX) 400 MG tablet Take 400-800 mg by mouth See admin instructions. 800mg  in am, 400mg  in afternoon and in pm    . metFORMIN (GLUCOPHAGE-XR) 500 MG 24 hr tablet     . Multiple Vitamin (MULTIVITAMIN) tablet Take 1 tablet by mouth daily.    . sertraline (ZOLOFT) 100 MG tablet Take 100 mg by mouth daily.    . Vitamin D, Ergocalciferol, (DRISDOL) 50000 units CAPS capsule Take 50,000 Units by mouth every 7 (seven) days.    Marland Kitchen amLODipine (NORVASC) 10 MG tablet Take 10 mg by mouth daily.    . sertraline (ZOLOFT) 50 MG tablet Take 75 mg by mouth daily.    Marland Kitchen  econazole nitrate 1 % cream Apply 1 application topically daily. (Patient not taking: Reported on 02/08/2020)    . furosemide (LASIX) 20 MG tablet Take 1 tablet (20 mg total) by mouth daily. (Patient not taking: Reported on 02/08/2020) 30 tablet   . potassium chloride (K-DUR,KLOR-CON) 10 MEQ tablet Take 2 tablets (20 mEq total) by mouth daily. (Patient not taking: Reported on 02/08/2020)    . tiZANidine (ZANAFLEX) 4 MG tablet Take 4 mg by mouth 3 (three) times daily as needed for muscle spasms. (Patient not taking: Reported on 02/08/2020)    . traMADol (ULTRAM) 50 MG tablet  (Patient not taking: Reported on 02/08/2020)    . ALPRAZolam (XANAX) 0.5 MG tablet Take 0.5 mg by mouth See admin instructions. Take 1 tablet by mouth one hour prior to procedure on 07/16/16  (Patient not taking: Reported on 02/08/2020)    . amoxicillin-clavulanate (AUGMENTIN) 875-125 MG tablet     . lisinopril-hydrochlorothiazide (PRINZIDE,ZESTORETIC) 20-25 MG tablet  (Patient not taking: Reported on 02/08/2020)    . loperamide (IMODIUM) 2 MG capsule Take 1 capsule (2 mg total) by mouth every 2 (two) hours as needed for diarrhea or loose stools. 30 capsule 0  . metFORMIN (GLUCOPHAGE) 500 MG tablet Take by mouth 2 (two) times daily with a meal.    . oseltamivir (TAMIFLU) 30 MG capsule Take 1 capsule (30 mg total) by mouth daily. 4 capsule 0   No facility-administered medications prior to visit.    PAST MEDICAL HISTORY: Past Medical History:  Diagnosis Date  . Anemia   . 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 CHF (congestive heart failure) ([place]) 07/17/2016  . CKD (chronic kidney disease), stage III   . Depression   . Family history of adverse reaction to anesthesia    "her father had some issues when he had his gallbladder taken out when he was in his late 30s"  . GERD (gastroesophageal reflux disease)   . Glaucoma    open angle, bilateral  . High cholesterol   . History of kidney stones   . Hypertension   . Lumbago with sciatica, right side   . Sepsis ([place]) 07/2016   d/t flu  . Type II diabetes mellitus ([place])   . Vitamin D deficiency     PAST SURGICAL HISTORY: Past Surgical History:  Procedure Laterality Date  . BREAST BIOPSY  1990   "? side; it wasn't anything"  . CATARACT EXTRACTION, BILATERAL  2021  . CHOLECYSTECTOMY OPEN  1971  . TUBAL LIGATION  1978    FAMILY HISTORY: Family History  Problem Relation Age of Onset  . Dementia Mother   . Diabetes Mother   . Stroke Father   . Hypertension Father   . Diabetes Sister   . Hypertension Sister   . Breast cancer Neg Hx     SOCIAL HISTORY: Social History   Socioeconomic History  . Marital status: Widowed    Spouse name: Not on file  . Number of children: 2  . Years of education: Not on file   . Highest education level: Some college, no degree  Occupational History  . Not on file  Tobacco Use  . Smoking status: Never Smoker  . Smokeless tobacco: Never Used  Substance and Sexual Activity  . Alcohol use: Never  . Drug use: Never  . Sexual activity: Not on file  Other Topics Concern  . Not on file  Social History Narrative   02/08/20 Lives alone   caffeine  soda 1-2 daily   Social Determinants of Health   Financial Resource Strain:   . Difficulty of Paying Living Expenses: Not on file  Food Insecurity:   . Worried About Charity fundraiser in the Last Year: Not on file  . Ran Out of Food in the Last Year: Not on file  Transportation Needs:   . Lack of Transportation (Medical): Not on file  . Lack of Transportation (Non-Medical): Not on file  Physical Activity:   . Days of Exercise per Week: Not on file  . Minutes of Exercise per Session: Not on file  Stress:   . Feeling of Stress : Not on file  Social Connections:   . Frequency of Communication with Friends and Family: Not on file  . Frequency of Social Gatherings with Friends and Family: Not on file  . Attends Religious Services: Not on file  . Active Member of Clubs or Organizations: Not on file  . Attends Archivist Meetings: Not on file  . Marital Status: Not on file  Intimate Partner Violence:   . Fear of Current or Ex-Partner: Not on file  . Emotionally Abused: Not on file  . Physically Abused: Not on file  . Sexually Abused: Not on file     PHYSICAL EXAM  GENERAL EXAM/CONSTITUTIONAL: Vitals:  Vitals:   02/08/20 0845  BP: 110/70  Pulse: 68  Weight: 177 lb 3.2 oz (80.4 kg)  Height: 5\' 2"  (1.575 m)     Body mass index is 32.41 kg/m. Wt Readings from Last 3 Encounters:  02/08/20 177 lb 3.2 oz (80.4 kg)  10/17/16 213 lb (96.6 kg)  07/21/16 213 lb 4.8 oz (96.8 kg)     Patient is in no distress; well developed, nourished and groomed; neck is supple  CARDIOVASCULAR:  Examination  of carotid arteries is normal; no carotid bruits  Regular rate and rhythm, no murmurs  Examination of peripheral vascular system by observation and palpation is normal  EYES:  Ophthalmoscopic exam of optic discs and posterior segments is normal; no papilledema or hemorrhages  No exam data present  MUSCULOSKELETAL:  Gait, strength, tone, movements noted in Neurologic exam below  NEUROLOGIC: MENTAL STATUS:  No flowsheet data found.  awake, alert, oriented to person, place and time  recent and remote memory intact  normal attention and concentration  language fluent, comprehension intact, naming intact  fund of knowledge appropriate  CRANIAL NERVE:   2nd - no papilledema on fundoscopic exam  2nd, 3rd, 4th, 6th - pupils equal and reactive to light, visual fields full to confrontation, extraocular muscles intact, no nystagmus  5th - facial sensation symmetric  7th - facial strength symmetric  8th - hearing intact  9th - palate elevates symmetrically, uvula midline  11th - shoulder shrug symmetric  12th - tongue protrusion midline  MOTOR:   normal bulk and tone, full strength in the BUE, BLE  SENSORY:   normal and symmetric to light touch, pinprick, temperature, vibration  COORDINATION:   finger-nose-finger, fine finger movements normal  REFLEXES:   deep tendon reflexes present and symmetric  GAIT/STATION:   narrow based gait; able to walk on toes, heels and tandem; romberg is negative     DIAGNOSTIC DATA (LABS, IMAGING, TESTING) - I reviewed patient records, labs, notes, testing and imaging myself where available.  Lab Results  Component Value Date   WBC 5.1 07/18/2016   HGB 12.6 07/18/2016   HCT 36.8 07/18/2016   MCV 91.1 07/18/2016  PLT 206 07/18/2016      Component Value Date/Time   NA 133 (L) 07/21/2016 0446   K 3.5 07/21/2016 0446   CL 105 07/21/2016 0446   CO2 18 (L) 07/21/2016 0446   GLUCOSE 115 (H) 07/21/2016 0446   BUN 33  (H) 07/21/2016 0446   CREATININE 1.97 (H) 07/21/2016 0446   CALCIUM 6.5 (L) 07/21/2016 0446   PROT 3.8 (L) 07/18/2016 0603   ALBUMIN <1.0 (L) 07/18/2016 0603   AST 48 (H) 07/18/2016 0603   ALT 30 07/18/2016 0603   ALKPHOS 46 07/18/2016 0603   BILITOT 0.2 (L) 07/18/2016 0603   GFRNONAA 25 (L) 07/21/2016 0446   GFRAA 29 (L) 07/21/2016 0446   No results found for: CHOL, HDL, LDLCALC, LDLDIRECT, TRIG, CHOLHDL Lab Results  Component Value Date   HGBA1C 7.3 (H) 07/17/2016   No results found for: [id number] Lab Results  Component Value Date   TSH 0.929 07/17/2016       ASSESSMENT AND PLAN  74 y.o. year old female here with 1 week of right ear aching and pressure sensation in the setting of allergy symptoms and nasal discharge, now resolved.  Dx:  1. Right ear pain     PLAN:  RIGHT EAR ACHING (in setting of right mastoid pain, allergies, rhinorrhea) - symptoms resolved after 1 week - monitor for now  Return for pending if symptoms worsen or fail to improve, return to PCP.    Penni Bombard, MD 3/43/7357, 8:97 AM Certified in Neurology, Neurophysiology and Neuroimaging  [facility] [address] [place], [place] [address] [phone] a

## 2020-04-05 NOTE — Progress Notes (Signed)
Cardiology Office Note:   Date:  04/07/2020  NAME:  Michaela Santana    MRN: [id number] DOB:  12-22-1945   PCP:  [name], MD  Cardiologist:  No primary care provider on file.   Referring MD: [name], *   Chief Complaint  Patient presents with  . Shortness of Breath    History of Present Illness:   Michaela Santana is a 74 y.o. female with a hx of DM, HTN who is being seen today for the evaluation of SOB/palpitations at the request of [name], MD.  She reports for the last 6 months she has had progressively worsening shortness of breath.  She reports when she walks or does any exertion she does feel winded.  She also reports fatigue and low energy.  Recent thyroid study was normal.  She was seen by her primary care physician and noted to have normal sinus rhythm with prolonged first-degree AV block up to 330 ms.  She does have PACs with Ashman complexes.  She denies any syncope.  She reports no exertional chest pain or pressure.  She does report that she has been depressed.  Her husband died of lymphoma in 2017-09-11.  He did suffer from a Takotsubo cardiomyopathy.  She does report that her father had a pacemaker.  She is diabetic but this appears to be well controlled.  Blood pressure well controlled today.  She is on no AV nodal blocking agents.  She has no evidence of congestive heart failure examination.  She is a never smoker.  She does not drink alcohol or consume drugs.  She is retired Surveyor, minerals.  Her BMI is 33.  There is no concern for sleep apnea.  Her symptoms are getting worse.  She does want to know if anything is wrong with her heart.  She had an echocardiogram in 09-11-2016.  Apparently this was normal.  There was mention of grade 1 diastolic dysfunction.   Total cholesterol 117, HDL 43, LDL 50, triglycerides 139, A1c 6.5, hemoglobin 12.7, TSH 2.56  Past Medical History: Past Medical History:  Diagnosis Date  . Anemia   . Arthritis    "probably  in hands" (07/17/2016)  . CKD (chronic kidney disease), stage III ([place])   . Depression   . Family history of adverse reaction to anesthesia    "her father had some issues when he had his gallbladder taken out when he was in his late 54s"  . GERD (gastroesophageal reflux disease)   . Glaucoma    open angle, bilateral  . High cholesterol   . History of kidney stones   . Hypertension   . Lumbago with sciatica, right side   . Sepsis ([place]) 07/2016   d/t flu  . Type II diabetes mellitus ([place])   . Vitamin D deficiency     Past Surgical History: Past Surgical History:  Procedure Laterality Date  . BREAST BIOPSY  1990   "? side; it wasn't anything"  . CATARACT EXTRACTION, BILATERAL  2019/09/12  . CHOLECYSTECTOMY OPEN  1971  . TUBAL LIGATION  1978    Current Medications: Current Meds  Medication Sig  . amLODipine (NORVASC) 5 MG tablet Take 5 mg by mouth daily.  Marland Kitchen aspirin 81 MG chewable tablet Chew 81 mg by mouth daily.  Marland Kitchen atorvastatin (LIPITOR) 10 MG tablet Take 10 mg by mouth daily.  . Blood Glucose Monitoring Suppl (FREESTYLE LITE) DEVI USE UTD  . cetirizine (ZYRTEC) 10 MG chewable tablet  Chew 10 mg by mouth daily.  Marland Kitchen econazole nitrate 1 % cream Apply 1 application topically daily.   . fluticasone (FLONASE) 50 MCG/ACT nasal spray Place 2 sprays into both nostrils daily as needed for allergies or rhinitis.  Marland Kitchen FREESTYLE LITE test strip USE TO CHECK FASTING GLUCOSE IN THE MORNING AND 2 HOURS AFTER DINNER OR LUNCH  . gabapentin (NEURONTIN) 300 MG capsule Take 300 mg by mouth daily.  Marland Kitchen glipiZIDE (GLUCOTROL XL) 5 MG 24 hr tablet   . ipratropium (ATROVENT) 0.06 % nasal spray Place 1 spray into both nostrils daily as needed for allergies.  . Lancets (FREESTYLE) lancets   . lansoprazole (PREVACID) 30 MG capsule Take 30 mg by mouth daily at 12 noon.  Marland Kitchen losartan-hydrochlorothiazide (HYZAAR) 100-25 MG tablet Take 1 tablet by mouth daily.  . magnesium oxide (MAG-OX) 400 MG tablet Take 400-800 mg by  mouth See admin instructions. 800mg  in am, 400mg  in afternoon and in pm  . metFORMIN (GLUCOPHAGE-XR) 500 MG 24 hr tablet   . Multiple Vitamin (MULTIVITAMIN) tablet Take 1 tablet by mouth daily.  . sertraline (ZOLOFT) 100 MG tablet Take 100 mg by mouth daily.  . Vitamin D, Ergocalciferol, (DRISDOL) 50000 units CAPS capsule Take 50,000 Units by mouth every 7 (seven) days.  . [DISCONTINUED] Calcium Carbonate-Vitamin D 600-200 MG-UNIT TABS Take 1 tablet by mouth 2 (two) times daily.  . [DISCONTINUED] furosemide (LASIX) 20 MG tablet Take 1 tablet (20 mg total) by mouth daily.  . [DISCONTINUED] potassium chloride (K-DUR,KLOR-CON) 10 MEQ tablet Take 2 tablets (20 mEq total) by mouth daily.  . [DISCONTINUED] tiZANidine (ZANAFLEX) 4 MG tablet Take 4 mg by mouth 3 (three) times daily as needed for muscle spasms.   . [DISCONTINUED] traMADol (ULTRAM) 50 MG tablet      Allergies:    Codeine and Alprazolam   Social History: Social History   Socioeconomic History  . Marital status: Widowed    Spouse name: Not on file  . Number of children: 2  . Years of education: Not on file  . Highest education level: Some college, no degree  Occupational History  . Occupation: Environmental consultant  Tobacco Use  . Smoking status: Never Smoker  . Smokeless tobacco: Never Used  Substance and Sexual Activity  . Alcohol use: Never  . Drug use: Never  . Sexual activity: Not on file  Other Topics Concern  . Not on file  Social History Narrative   02/08/20 Lives alone   caffeine soda 1-2 daily   Social Determinants of Health   Financial Resource Strain:   . Difficulty of Paying Living Expenses: Not on file  Food Insecurity:   . Worried About Charity fundraiser in the Last Year: Not on file  . Ran Out of Food in the Last Year: Not on file  Transportation Needs:   . Lack of Transportation (Medical): Not on file  . Lack of Transportation (Non-Medical): Not on file  Physical Activity:   . Days of Exercise per Week:  Not on file  . Minutes of Exercise per Session: Not on file  Stress:   . Feeling of Stress : Not on file  Social Connections:   . Frequency of Communication with Friends and Family: Not on file  . Frequency of Social Gatherings with Friends and Family: Not on file  . Attends Religious Services: Not on file  . Active Member of Clubs or Organizations: Not on file  . Attends Archivist Meetings: Not on file  .  Marital Status: Not on file     Family History: The patient's family history includes Dementia in her mother; Diabetes in her mother and sister; Heart disease in her paternal grandfather; Hypertension in her father and sister; Stroke in her father. There is no history of Breast cancer.  ROS:   All other ROS reviewed and negative. Pertinent positives noted in the HPI.     EKGs/Labs/Other Studies Reviewed:   The following studies were personally reviewed by me today:  EKG:  EKG is ordered today.  The ekg ordered today demonstrates normal sinus rhythm with first-degree AV block up to 336 ms, PACs noted with Ashman complexes, and was personally reviewed by me.   Recent Labs: No results found for requested labs within last 8760 hours.   Recent Lipid Panel No results found for: CHOL, TRIG, HDL, CHOLHDL, VLDL, LDLCALC, LDLDIRECT  Physical Exam:   VS:  BP 125/81   Pulse 71   Temp (!) 96.8 F (36 C)   Ht 5\' 1"  (1.549 m)   Wt 177 lb (80.3 kg)   SpO2 96%   BMI 33.44 kg/m    Wt Readings from Last 3 Encounters:  04/07/20 177 lb (80.3 kg)  02/08/20 177 lb 3.2 oz (80.4 kg)  10/17/16 213 lb (96.6 kg)    General: Well nourished, well developed, in no acute distress Heart: Atraumatic, normal size  Eyes: PEERLA, EOMI  Neck: Supple, no JVD Endocrine: No thryomegaly Cardiac: Normal S1, S2; RRR; no murmurs, rubs, or gallops Lungs: Clear to auscultation bilaterally, no wheezing, rhonchi or rales  Abd: Soft, nontender, no hepatomegaly  Ext: No edema, pulses  2+ Musculoskeletal: No deformities, BUE and BLE strength normal and equal Skin: Warm and dry, no rashes   Neuro: Alert and oriented to person, place, time, and situation, CNII-XII grossly intact, no focal deficits  Psych: Normal mood and affect   ASSESSMENT:   Michaela Santana is a 74 y.o. female who presents for the following: 1. SOB (shortness of breath) on exertion   2. Sinus bradycardia   3. First degree AV block   4. Palpitations   5. PAC (premature atrial contraction)   6. Nonspecific abnormal electrocardiogram (ECG) (EKG)     PLAN:   1. SOB (shortness of breath) on exertion 2. Sinus bradycardia 3. First degree AV block 4. Palpitations 5. PAC (premature atrial contraction) 6. Nonspecific abnormal electrocardiogram (ECG) (EKG) -6 months of shortness of breath.  No evidence of heart failure examination.  I would like to check a BNP as well as repeat her echocardiogram.  She had an echocardiogram 3 years ago that showed grade 1 diastolic dysfunction. -Her EKG today demonstrates normal sinus rhythm with a very prolonged first-degree AV block.  She has a PR interval up to 336 ms.  She also has PACs with Ashman complexes.  There is a noticeable pattern to this.  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  There is clear prolongation of the PR interval and then a noticeable PAC that occurs in triplets. -Given that she does have conduction disease she may have worsening conduction disease at other times to explain her symptoms.  She needs an echocardiogram as discussed above.  I would like to proceed with a 7-day Zio patch to exclude any significant bradycardia.  Depending what we find she likely will need a stress test.  We will see what the monitor shows first before we proceed with that.  She should avoid AV nodal agents moving forward.  She had no syncope so there are no restrictions on driving her activity at this point.  She will let us know if any change in symptoms to occur.   -Overall, I  have a strong suspicion that conduction disease may be an issue for her.  We will make sure with the above work-up.  Disposition: Return in about 3 months (around 07/08/2020).  Medication Adjustments/Labs and Tests Ordered: Current medicines are reviewed at length with the patient today.  Concerns regarding medicines are outlined above.  Orders Placed This Encounter  Procedures  . B Nat Peptide  . LONG TERM MONITOR (3-14 DAYS)  . EKG 12-Lead  . ECHOCARDIOGRAM COMPLETE   No orders of the defined types were placed in this encounter.   Patient Instructions  Medication Instructions:  Continue current medication *If you need a refill on your cardiac medications before your next appointment, please call your pharmacy*   Lab Work: BNP If you have labs (blood work) drawn today and your tests are completely normal, you will receive your results only by: Marland Kitchen MyChart Message (if you have MyChart) OR . A paper copy in the mail If you have any lab test that is abnormal or we need to change your treatment, we will call you to review the results.   Testing/Procedures: Your physician has requested that you have an echocardiogram. Echocardiography is a painless test that uses sound waves to create images of your heart. It provides your doctor with information about the size and shape of your heart and how well your heart's chambers and valves are working. This procedure takes approximately one hour. There are no restrictions for this procedure.  Your physician has recommended that you wear a 1 week Zio monitor. Holter monitors are medical devices that record the heart's electrical activity. Doctors most often use these monitors to diagnose arrhythmias. Arrhythmias are problems with the speed or rhythm of the heartbeat. The monitor is a small, portable device. You can wear one while you do your normal daily activities. This is usually used to diagnose what is causing palpitations/syncope (passing  out).     Follow-Up: At [facility], you and your health needs are our priority.  As part of our continuing mission to provide you with exceptional heart care, we have created designated Provider Care Teams.  These Care Teams include your primary Cardiologist (physician) and Advanced Practice Providers (APPs -  Physician Assistants and Nurse Practitioners) who all work together to provide you with the care you need, when you need it.  We recommend signing up for the patient portal called "MyChart".  Sign up information is provided on this After Visit Summary.  MyChart is used to connect with patients for Virtual Visits (Telemedicine).  Patients are able to view lab/test results, encounter notes, upcoming appointments, etc.  Non-urgent messages can be sent to your provider as well.   To learn more about what you can do with MyChart, go to [email or web address].    Your next appointment:   3 month(s)  The format for your next appointment:   In Person  Provider:   You may see Dr Audie Box or one of the following Advanced Practice Providers on your designated Care Team:    Almyra Deforest, PA-C  Fabian Sharp, [place] or   Roby Lofts, PA-C           Signed, Addison Naegeli. Audie Box, [place]  [address], [place], [place] [address] [phone]  04/07/2020 9:27  AM

## 2020-04-06 ENCOUNTER — Ambulatory Visit: Payer: Medicare Other | Admitting: Cardiovascular Disease

## 2020-04-07 ENCOUNTER — Other Ambulatory Visit: Payer: Self-pay

## 2020-04-07 ENCOUNTER — Telehealth: Payer: Self-pay | Admitting: Radiology

## 2020-04-07 ENCOUNTER — Ambulatory Visit (INDEPENDENT_AMBULATORY_CARE_PROVIDER_SITE_OTHER): Payer: Medicare Other | Admitting: Cardiovascular Disease

## 2020-04-07 ENCOUNTER — Encounter: Payer: Self-pay | Admitting: Cardiovascular Disease

## 2020-04-07 VITALS — BP 125/81 | HR 71 | Temp 96.8°F | Ht 61.0 in | Wt 177.0 lb

## 2020-04-07 DIAGNOSIS — R0602 Shortness of breath: Secondary | ICD-10-CM | POA: Diagnosis not present

## 2020-04-07 DIAGNOSIS — R002 Palpitations: Secondary | ICD-10-CM

## 2020-04-07 DIAGNOSIS — I491 Atrial premature depolarization: Secondary | ICD-10-CM | POA: Diagnosis not present

## 2020-04-07 DIAGNOSIS — I440 Atrioventricular block, first degree: Secondary | ICD-10-CM

## 2020-04-07 DIAGNOSIS — R001 Bradycardia, unspecified: Secondary | ICD-10-CM

## 2020-04-07 DIAGNOSIS — R9431 Abnormal electrocardiogram [ECG] [EKG]: Secondary | ICD-10-CM

## 2020-04-07 NOTE — Telephone Encounter (Signed)
Enrolled patient for a 7 day Zio XT monitor to be mailed to patients home.  

## 2020-04-07 NOTE — Patient Instructions (Signed)
Medication Instructions:  Continue current medication *If you need a refill on your cardiac medications before your next appointment, please call your pharmacy*   Lab Work: BNP If you have labs (blood work) drawn today and your tests are completely normal, you will receive your results only by:  [place] (if you have MyChart) OR  A paper copy in the mail If you have any lab test that is abnormal or we need to change your treatment, we will call you to review the results.   Testing/Procedures: Your physician has requested that you have an echocardiogram. Echocardiography is a painless test that uses sound waves to create images of your heart. It provides your doctor with information about the size and shape of your heart and how well your hearts chambers and valves are working. This procedure takes approximately one hour. There are no restrictions for this procedure.  Your physician has recommended that you wear a 1 week Zio monitor. Holter monitors are medical devices that record the hearts electrical activity. Doctors most often use these monitors to diagnose arrhythmias. Arrhythmias are problems with the speed or rhythm of the heartbeat. The monitor is a small, portable device. You can wear one while you do your normal daily activities. This is usually used to diagnose what is causing palpitations/syncope (passing out).     Follow-Up: At [facility], you and your health needs are our priority.  As part of our continuing mission to provide you with exceptional heart care, we have created designated Provider Care Teams.  These Care Teams include your primary Cardiologist (physician) and Advanced Practice Providers (APPs -  Physician Assistants and Nurse Practitioners) who all work together to provide you with the care you need, when you need it.  We recommend signing up for the patient portal called "MyChart".  Sign up information is provided on this After Visit Summary.   MyChart is used to connect with patients for Virtual Visits (Telemedicine).  Patients are able to view lab/test results, encounter notes, upcoming appointments, etc.  Non-urgent messages can be sent to your provider as well.   To learn more about what you can do with MyChart, go to [email or web address].    Your next appointment:   3 month(s)  The format for your next appointment:   In Person  Provider:   You may see Dr Audie Box or one of the following Advanced Practice Providers on your designated Care Team:    Almyra Deforest, PA-C  Fabian Sharp, PA-C or   Roby Lofts, [place]

## 2020-04-08 LAB — BRAIN NATRIURETIC PEPTIDE: BNP: 38.3 pg/mL (ref 0.0–100.0)

## 2020-04-11 ENCOUNTER — Other Ambulatory Visit (INDEPENDENT_AMBULATORY_CARE_PROVIDER_SITE_OTHER): Payer: Medicare Other

## 2020-04-11 DIAGNOSIS — I491 Atrial premature depolarization: Secondary | ICD-10-CM

## 2020-04-11 DIAGNOSIS — R002 Palpitations: Secondary | ICD-10-CM | POA: Diagnosis not present

## 2020-04-18 ENCOUNTER — Other Ambulatory Visit: Payer: Self-pay | Admitting: Family Medicine

## 2020-04-18 DIAGNOSIS — M8580 Other specified disorders of bone density and structure, unspecified site: Secondary | ICD-10-CM

## 2020-04-29 ENCOUNTER — Ambulatory Visit (HOSPITAL_COMMUNITY): Payer: Medicare Other | Attending: Cardiovascular Disease

## 2020-04-29 ENCOUNTER — Other Ambulatory Visit: Payer: Self-pay

## 2020-04-29 DIAGNOSIS — R0602 Shortness of breath: Secondary | ICD-10-CM | POA: Insufficient documentation

## 2020-04-29 LAB — ECHOCARDIOGRAM COMPLETE
Area-P 1/2: 2.91 cm2
S' Lateral: 1.9 cm

## 2020-07-07 NOTE — Progress Notes (Signed)
Cardiology Office Note:   Date:  07/08/2020  NAME:  Michaela Santana    MRN: [id number] DOB:  12/25/1945   PCP:  [name], MD  Cardiologist:  No primary care provider on file.   Referring MD: [name], *   Chief Complaint  Patient presents with  . Follow-up    History of Present Illness:   Michaela Santana is a 75 y.o. female with a hx of DM, HTN, obesity, 1AVB who presents for follow-up of SOB. Echo normal. Monitor without significant bradycardia. BNP 38.  She reports she is still getting short of breath.  This occurs with activity.  Does not occur at rest.  Her daughter presents with her.  She reports that when she walks across the room in her house she does get winded.  No chest pain reported.  No palpitations reported.  She not had any syncope.  Her blood pressure is 116/78.  All of her numbers are well controlled.  She is overweight with a BMI of 33.  She also is an active.  I suspect deconditioning is an element here.  She has no evidence of congestive heart failure.  She did have a monitor which showed 4.4% PAC burden 6.2% PVC burden.  This would not explain her shortness of breath.  I did discuss this with her in office.  This does not need treatment unless this bothers her.  No symptoms reported.  Normal LV function.  Next that would be a Lexi nuclear medicine stress test.  She is a never smoker.  I doubt she has any lung pathology.  Problem List 1. DM -A1c 6.5 2. HTN 3. HLD -T chol 117, HDL 43, LDL 50, TG 139 4. Obesity -BMI 33  5. 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 PACs -4.4% burden 7. PVCs  -6.2%  Past Medical History: Past Medical History:  Diagnosis Date  . Anemia   . 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 CKD (chronic kidney disease), stage III ([place])   . Depression   . Family history of adverse reaction to anesthesia    "her father had some issues when he had his gallbladder taken out when he was in his late 89s"  . GERD (gastroesophageal reflux  disease)   . Glaucoma    open angle, bilateral  . High cholesterol   . History of kidney stones   . Hypertension   . Lumbago with sciatica, right side   . Sepsis ([place]) 07/2016   d/t flu  . Type II diabetes mellitus ([place])   . Vitamin D deficiency     Past Surgical History: Past Surgical History:  Procedure Laterality Date  . BREAST BIOPSY  1990   "? side; it wasn't anything"  . CATARACT EXTRACTION, BILATERAL  2021  . CHOLECYSTECTOMY OPEN  1971  . TUBAL LIGATION  1978    Current Medications: Current Meds  Medication Sig  . amLODipine (NORVASC) 5 MG tablet Take 5 mg by mouth daily.  Marland Kitchen atorvastatin (LIPITOR) 10 MG tablet Take 10 mg by mouth daily.  . Blood Glucose Monitoring Suppl (FREESTYLE LITE) DEVI USE UTD  . cetirizine (ZYRTEC) 10 MG chewable tablet Chew 10 mg by mouth daily.  Marland Kitchen econazole nitrate 1 % cream Apply 1 application topically daily.   Marland Kitchen FREESTYLE LITE test strip USE TO CHECK FASTING GLUCOSE IN THE MORNING AND 2 HOURS AFTER DINNER OR LUNCH  . gabapentin (NEURONTIN) 300 MG capsule Take 300 mg by mouth daily.  Marland Kitchen  glipiZIDE (GLUCOTROL XL) 5 MG 24 hr tablet   . Lancets (FREESTYLE) lancets   . lansoprazole (PREVACID) 30 MG capsule Take 30 mg by mouth daily at 12 noon.  Marland Kitchen losartan-hydrochlorothiazide (HYZAAR) 100-25 MG tablet Take 1 tablet by mouth daily.  . magnesium oxide (MAG-OX) 400 MG tablet Take 400-800 mg by mouth See admin instructions. 800mg  in am, 400mg  in afternoon and in pm  . metFORMIN (GLUCOPHAGE-XR) 500 MG 24 hr tablet   . Multiple Vitamin (MULTIVITAMIN) tablet Take 1 tablet by mouth daily.  . sertraline (ZOLOFT) 100 MG tablet Take 100 mg by mouth daily.  . Vitamin D, Ergocalciferol, (DRISDOL) 50000 units CAPS capsule Take 50,000 Units by mouth every 7 (seven) days.  . [DISCONTINUED] aspirin 81 MG chewable tablet Chew 81 mg by mouth daily.  . [DISCONTINUED] fluticasone (FLONASE) 50 MCG/ACT nasal spray Place 2 sprays into both nostrils daily as needed for  allergies or rhinitis.  . [DISCONTINUED] ipratropium (ATROVENT) 0.06 % nasal spray Place 1 spray into both nostrils daily as needed for allergies.     Allergies:    Codeine and Alprazolam   Social History: Social History   Socioeconomic History  . Marital status: Widowed    Spouse name: Not on file  . Number of children: 2  . Years of education: Not on file  . Highest education level: Some college, no degree  Occupational History  . Occupation: Environmental consultant  Tobacco Use  . Smoking status: Never Smoker  . Smokeless tobacco: Never Used  Substance and Sexual Activity  . Alcohol use: Never  . Drug use: Never  . Sexual activity: Not on file  Other Topics Concern  . Not on file  Social History Narrative   02/08/20 Lives alone   caffeine soda 1-2 daily   Social Determinants of Health   Financial Resource Strain: Not on file  Food Insecurity: Not on file  Transportation Needs: Not on file  Physical Activity: Not on file  Stress: Not on file  Social Connections: Not on file     Family History: The patient's family history includes Dementia in her mother; Diabetes in her mother and sister; Heart disease in her paternal grandfather; Hypertension in her father and sister; Stroke in her father. There is no history of Breast cancer.  ROS:   All other ROS reviewed and negative. Pertinent positives noted in the HPI.     EKGs/Labs/Other Studies Reviewed:   The following studies were personally reviewed by me today:  TTE 04/29/2020 1. Left ventricular ejection fraction, by estimation, is 65 to 70%. The  left ventricle has normal function. The left ventricle has no regional  wall motion abnormalities. Left ventricular diastolic parameters are  consistent with Grade I diastolic  dysfunction (impaired relaxation).  2. Right ventricular systolic function is normal. The right ventricular  size is normal. There is normal pulmonary artery systolic pressure. The  estimated right  ventricular systolic pressure is 17.6 mmHg.  3. The mitral valve is grossly normal. No evidence of mitral valve  regurgitation.  4. The aortic valve is tricuspid. Aortic valve regurgitation is not  visualized.  5. The inferior vena cava is normal in size with greater than 50%  respiratory variability, suggesting right atrial pressure of 3 mmHg.   Zio 05/02/2020  1. Second Degree AV Block Mobitz 1 was present but no high grade conduction disease was present.  2. Occasional PACs (4.4% burden).  3. Frequent PVCs (6.2%) burden.    Recent Labs: 04/07/2020: BNP 38.3  Recent Lipid Panel No results found for: CHOL, TRIG, HDL, CHOLHDL, VLDL, LDLCALC, LDLDIRECT  Physical Exam:   VS:  BP 116/78   Pulse 86   Ht 5\' 2"  (1.575 m)   Wt 177 lb 3.2 oz (80.4 kg)   SpO2 98%   BMI 32.41 kg/m    Wt Readings from Last 3 Encounters:  07/08/20 177 lb 3.2 oz (80.4 kg)  04/07/20 177 lb (80.3 kg)  02/08/20 177 lb 3.2 oz (80.4 kg)    General: Well nourished, well developed, in no acute distress Head: Atraumatic, normal size  Eyes: PEERLA, EOMI  Neck: Supple, no JVD Endocrine: No thryomegaly Cardiac: Normal S1, S2; RRR; no murmurs, rubs, or gallops Lungs: Clear to auscultation bilaterally, no wheezing, rhonchi or rales  Abd: Soft, nontender, no hepatomegaly  Ext: No edema, pulses 2+ Musculoskeletal: No deformities, BUE and BLE strength normal and equal Skin: Warm and dry, no rashes   Neuro: Alert and oriented to person, place, time, and situation, CNII-XII grossly intact, no focal deficits  Psych: Normal mood and affect   ASSESSMENT:   Michaela Santana is a 75 y.o. female who presents for the following: 1. SOB (shortness of breath) on exertion   2. First degree AV block   3. PAC (premature atrial contraction)   4. PVC (premature ventricular contraction)     PLAN:   1. SOB (shortness of breath) on exertion -Short of breath with exertion.  None at rest.  Echocardiogram with normal LV  function.  BNP 38.  No evidence of heart failure examination.  No symptoms concerning for angina.  Overall I feel she is deconditioned.  No heavy smoking history.  Lungs are clear.  I discussed with her that we will proceed with a Lexi nuclear medicine stress test.  I do not feel that she will have underlying CAD but we will make sure.  She does have a first-degree AV block but her heart rate does increase appropriately on her recent heart monitor.  She did have occasional PACs and PVCs on the monitor but this would not explain her shortness of breath unless her LV function had declined.  I do not feel this explains things either.  We will proceed with stress testing to rule out CAD.  2. First degree AV block -Recent monitor with no significant bradycardia or high-grade conduction disease.  3. PAC (premature atrial contraction) 4. PVC (premature ventricular contraction) -4.4% PAC burden.  6.2% PVC burden.  Normal LV function.  No A. fib.  No significant arrhythmias.  I do not think this explains her shortness of breath.  She has no symptoms from this.  Does not feel her heart racing.  This does not need treatment.  Given her first-degree block I favor holding any AV nodal agents anyway.  We will proceed with above stress testing to exclude CAD.  She will see [place] yearly moving forward.  Shared Decision Making/Informed Consent The risks [chest pain, shortness of breath, cardiac arrhythmias, dizziness, blood pressure fluctuations, myocardial infarction, stroke/transient ischemic attack, nausea, vomiting, allergic reaction, radiation exposure, metallic taste sensation and life-threatening complications (estimated to be 1 in 10,000)], benefits (risk stratification, diagnosing coronary artery disease, treatment guidance) and alternatives of a nuclear stress test were discussed in detail with Michaela Santana and she agrees to proceed.  Disposition: Return in about 1 year (around 07/08/2021).  Medication  Adjustments/Labs and Tests Ordered: Current medicines are reviewed at length with the patient today.  Concerns regarding medicines are outlined above.  Orders Placed This Encounter  Procedures  . Cardiac Stress Test: Informed Consent Details: Physician/Practitioner Attestation; Transcribe to consent form and obtain patient signature  . MYOCARDIAL PERFUSION IMAGING   No orders of the defined types were placed in this encounter.   Patient Instructions  Medication Instructions:  The current medical regimen is effective;  continue present plan and medications.  *If you need a refill on your cardiac medications before your next appointment, please call your pharmacy*   Testing/Procedures: Your physician has requested that you have a [place] @ [address]. A cardiac stress test is a cardiological test that measures the heart's ability to respond to external stress in a controlled clinical environment. The stress response is induced by intravenous pharmacological stimulation.    Follow-Up: At [facility], you and your health needs are our priority.  As part of our continuing mission to provide you with exceptional heart care, we have created designated Provider Care Teams.  These Care Teams include your primary Cardiologist (physician) and Advanced Practice Providers (APPs -  Physician Assistants and Nurse Practitioners) who all work together to provide you with the care you need, when you need it.  We recommend signing up for the patient portal called "MyChart".  Sign up information is provided on this After Visit Summary.  MyChart is used to connect with patients for Virtual Visits (Telemedicine).  Patients are able to view lab/test results, encounter notes, upcoming appointments, etc.  Non-urgent messages can be sent to your provider as well.   To learn more about what you can do with MyChart, go to [email or web address].    Your next appointment:   12 month(s)  The format  for your next appointment:   In Person  Provider:   Eleonore Chiquito, MD        Time Spent with Patient: I have spent a total of 35 minutes with patient reviewing hospital notes, telemetry, EKGs, labs and examining the patient as well as establishing an assessment and plan that was discussed with the patient.  > 50% of time was spent in direct patient care.  Signed, Addison Naegeli. Audie Box, [place]  [address] [place], [place] [address] [phone]  07/08/2020 8:22 AM

## 2020-07-08 ENCOUNTER — Ambulatory Visit (INDEPENDENT_AMBULATORY_CARE_PROVIDER_SITE_OTHER): Payer: Medicare Other | Admitting: Cardiovascular Disease

## 2020-07-08 ENCOUNTER — Other Ambulatory Visit: Payer: Self-pay

## 2020-07-08 ENCOUNTER — Encounter: Payer: Self-pay | Admitting: Cardiovascular Disease

## 2020-07-08 VITALS — BP 116/78 | HR 86 | Ht 62.0 in | Wt 177.2 lb

## 2020-07-08 DIAGNOSIS — R0602 Shortness of breath: Secondary | ICD-10-CM

## 2020-07-08 DIAGNOSIS — R002 Palpitations: Secondary | ICD-10-CM

## 2020-07-08 DIAGNOSIS — I491 Atrial premature depolarization: Secondary | ICD-10-CM

## 2020-07-08 DIAGNOSIS — I440 Atrioventricular block, first degree: Secondary | ICD-10-CM | POA: Diagnosis not present

## 2020-07-08 DIAGNOSIS — I493 Ventricular premature depolarization: Secondary | ICD-10-CM | POA: Diagnosis not present

## 2020-07-08 NOTE — Patient Instructions (Addendum)
Medication Instructions:  The current medical regimen is effective;  continue present plan and medications.  *If you need a refill on your cardiac medications before your next appointment, please call your pharmacy*   Testing/Procedures: Your physician has requested that you have a [place] @ [address]. A cardiac stress test is a cardiological test that measures the heart's ability to respond to external stress in a controlled clinical environment. The stress response is induced by intravenous pharmacological stimulation.    Follow-Up: At [facility], you and your health needs are our priority.  As part of our continuing mission to provide you with exceptional heart care, we have created designated Provider Care Teams.  These Care Teams include your primary Cardiologist (physician) and Advanced Practice Providers (APPs -  Physician Assistants and Nurse Practitioners) who all work together to provide you with the care you need, when you need it.  We recommend signing up for the patient portal called "MyChart".  Sign up information is provided on this After Visit Summary.  MyChart is used to connect with patients for Virtual Visits (Telemedicine).  Patients are able to view lab/test results, encounter notes, upcoming appointments, etc.  Non-urgent messages can be sent to your provider as well.   To learn more about what you can do with MyChart, go to [email or web address].    Your next appointment:   12 month(s)  The format for your next appointment:   In Person  Provider:   Eleonore Chiquito, MD

## 2020-07-13 ENCOUNTER — Telehealth (HOSPITAL_COMMUNITY): Payer: Self-pay | Admitting: *Deleted

## 2020-07-13 NOTE — Telephone Encounter (Signed)
Patient given detailed instructions per Myocardial Perfusion Study Information Sheet for the test on 07/18/20 at 0815. Patient notified to arrive 15 minutes early and that it is imperative to arrive on time for appointment to keep from having the test rescheduled.  If you need to cancel or reschedule your appointment, please call the office within 24 hours of your appointment. . Patient verbalized understanding.Edgard Debord, Ranae [place] Patient does not use mychart

## 2020-07-18 ENCOUNTER — Ambulatory Visit (HOSPITAL_COMMUNITY): Payer: Medicare Other | Attending: Internal Medicine

## 2020-07-18 ENCOUNTER — Other Ambulatory Visit: Payer: Self-pay

## 2020-07-18 DIAGNOSIS — R0602 Shortness of breath: Secondary | ICD-10-CM | POA: Diagnosis not present

## 2020-07-18 LAB — MYOCARDIAL PERFUSION IMAGING
LV dias vol: 59 mL (ref 46–106)
LV sys vol: 17 mL
Peak HR: 91 {beats}/min
Rest HR: 52 {beats}/min
SDS: 2
SRS: 1
SSS: 3
TID: 0.96

## 2020-07-18 MED ORDER — REGADENOSON 0.4 MG/5ML IV SOLN
0.4000 mg | Freq: Once | INTRAVENOUS | Status: AC
  Administered 2020-07-18: 0.4 mg via INTRAVENOUS

## 2020-07-18 MED ORDER — TECHNETIUM TC 99M TETROFOSMIN IV KIT
33.0000 | PACK | Freq: Once | INTRAVENOUS | Status: AC | PRN
  Administered 2020-07-18: 33 via INTRAVENOUS
  Filled 2020-07-18: qty 33

## 2020-07-18 MED ORDER — TECHNETIUM TC 99M TETROFOSMIN IV KIT
10.7000 | PACK | Freq: Once | INTRAVENOUS | Status: AC | PRN
  Administered 2020-07-18: 10.7 via INTRAVENOUS
  Filled 2020-07-18: qty 11

## 2020-07-22 ENCOUNTER — Telehealth: Payer: Self-pay | Admitting: Cardiovascular Disease

## 2020-07-22 NOTE — Telephone Encounter (Signed)
Pt updated with test results and verbalized understanding.

## 2020-07-22 NOTE — Telephone Encounter (Signed)
° ° °  Pt is calling to get stress test result

## 2020-07-27 ENCOUNTER — Other Ambulatory Visit: Payer: Medicare Other

## 2020-07-30 ENCOUNTER — Other Ambulatory Visit: Payer: Self-pay

## 2020-07-30 ENCOUNTER — Ambulatory Visit
Admission: RE | Admit: 2020-07-30 | Discharge: 2020-07-30 | Disposition: A | Discharge status: Home / Self Care | Payer: Medicare Other | Source: Ambulatory Visit | Attending: Family Medicine | Admitting: Family Medicine

## 2020-07-30 DIAGNOSIS — Z780 Asymptomatic menopausal state: Secondary | ICD-10-CM | POA: Diagnosis not present

## 2020-07-30 DIAGNOSIS — M8580 Other specified disorders of bone density and structure, unspecified site: Secondary | ICD-10-CM

## 2020-07-30 DIAGNOSIS — M85851 Other specified disorders of bone density and structure, right thigh: Secondary | ICD-10-CM | POA: Diagnosis not present

## 2020-08-23 DIAGNOSIS — R062 Wheezing: Secondary | ICD-10-CM | POA: Diagnosis not present

## 2020-09-07 DIAGNOSIS — D2239 Melanocytic nevi of other parts of face: Secondary | ICD-10-CM | POA: Diagnosis not present

## 2020-09-07 DIAGNOSIS — D224 Melanocytic nevi of scalp and neck: Secondary | ICD-10-CM | POA: Diagnosis not present

## 2020-09-07 DIAGNOSIS — D2262 Melanocytic nevi of left upper limb, including shoulder: Secondary | ICD-10-CM | POA: Diagnosis not present

## 2020-09-07 DIAGNOSIS — L821 Other seborrheic keratosis: Secondary | ICD-10-CM | POA: Diagnosis not present

## 2020-09-07 DIAGNOSIS — D22122 Melanocytic nevi of left lower eyelid, including canthus: Secondary | ICD-10-CM | POA: Diagnosis not present

## 2020-09-07 DIAGNOSIS — L720 Epidermal cyst: Secondary | ICD-10-CM | POA: Diagnosis not present

## 2020-09-07 DIAGNOSIS — D225 Melanocytic nevi of trunk: Secondary | ICD-10-CM | POA: Diagnosis not present

## 2020-09-07 DIAGNOSIS — L814 Other melanin hyperpigmentation: Secondary | ICD-10-CM | POA: Diagnosis not present

## 2020-10-04 DIAGNOSIS — R5383 Other fatigue: Secondary | ICD-10-CM | POA: Diagnosis not present

## 2020-10-04 DIAGNOSIS — F322 Major depressive disorder, single episode, severe without psychotic features: Secondary | ICD-10-CM | POA: Diagnosis not present

## 2020-10-04 DIAGNOSIS — E1122 Type 2 diabetes mellitus with diabetic chronic kidney disease: Secondary | ICD-10-CM | POA: Diagnosis not present

## 2020-10-04 DIAGNOSIS — I10 Essential (primary) hypertension: Secondary | ICD-10-CM | POA: Diagnosis not present

## 2020-10-24 DIAGNOSIS — E119 Type 2 diabetes mellitus without complications: Secondary | ICD-10-CM | POA: Diagnosis not present

## 2020-11-21 DIAGNOSIS — H401131 Primary open-angle glaucoma, bilateral, mild stage: Secondary | ICD-10-CM | POA: Diagnosis not present

## 2020-11-21 DIAGNOSIS — D3132 Benign neoplasm of left choroid: Secondary | ICD-10-CM | POA: Diagnosis not present

## 2020-11-21 DIAGNOSIS — Z961 Presence of intraocular lens: Secondary | ICD-10-CM | POA: Diagnosis not present

## 2020-11-21 DIAGNOSIS — H26491 Other secondary cataract, right eye: Secondary | ICD-10-CM | POA: Diagnosis not present

## 2020-11-21 DIAGNOSIS — H43813 Vitreous degeneration, bilateral: Secondary | ICD-10-CM | POA: Diagnosis not present

## 2020-11-21 DIAGNOSIS — E119 Type 2 diabetes mellitus without complications: Secondary | ICD-10-CM | POA: Diagnosis not present

## 2021-01-02 ENCOUNTER — Other Ambulatory Visit: Payer: Self-pay | Admitting: Family Medicine

## 2021-01-02 DIAGNOSIS — Z1231 Encounter for screening mammogram for malignant neoplasm of breast: Secondary | ICD-10-CM

## 2021-01-04 ENCOUNTER — Ambulatory Visit
Admission: RE | Admit: 2021-01-04 | Discharge: 2021-01-04 | Disposition: A | Discharge status: Home / Self Care | Payer: Medicare Other | Source: Ambulatory Visit | Attending: Family Medicine | Admitting: Family Medicine

## 2021-01-04 ENCOUNTER — Other Ambulatory Visit: Payer: Self-pay

## 2021-01-04 DIAGNOSIS — Z1231 Encounter for screening mammogram for malignant neoplasm of breast: Secondary | ICD-10-CM | POA: Diagnosis not present

## 2021-01-04 IMAGING — MG MM DIGITAL SCREENING BILAT W/ TOMO AND CAD
7 series · 8 of 23 positions shown · non-contrast
Comparison: Previous exam(s).

CLINICAL DATA: Screening.

EXAM:
DIGITAL SCREENING BILATERAL MAMMOGRAM WITH TOMOSYNTHESIS AND CAD
TECHNIQUE: Bilateral screening digital craniocaudal and mediolateral oblique
mammograms were obtained. Bilateral screening digital breast
tomosynthesis was performed. The images were evaluated with
computer-aided detection.

[L MLO synth-2D]
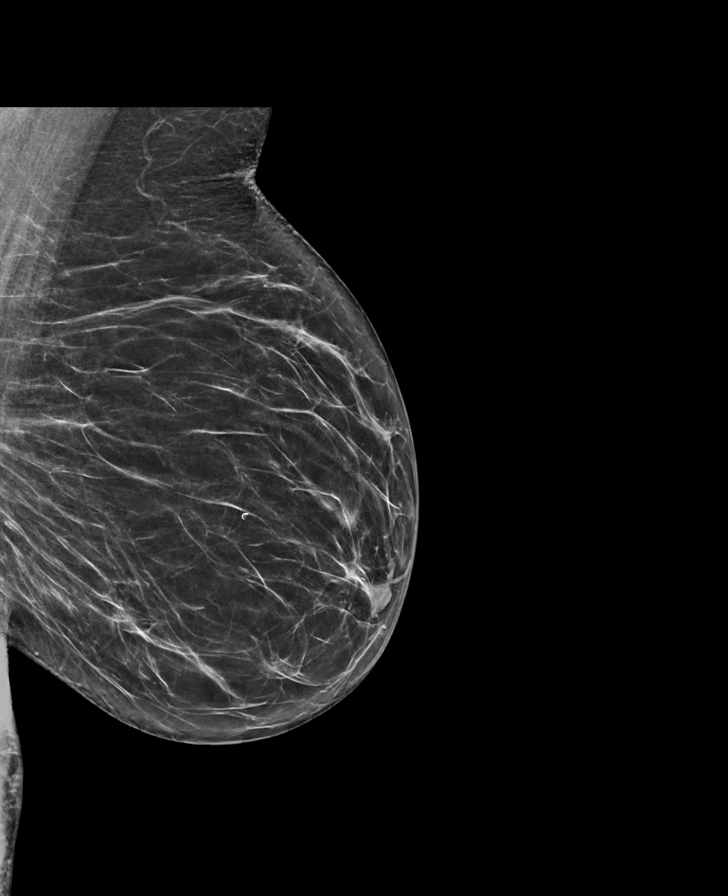

[L CC synth-2D (1 of 2)]
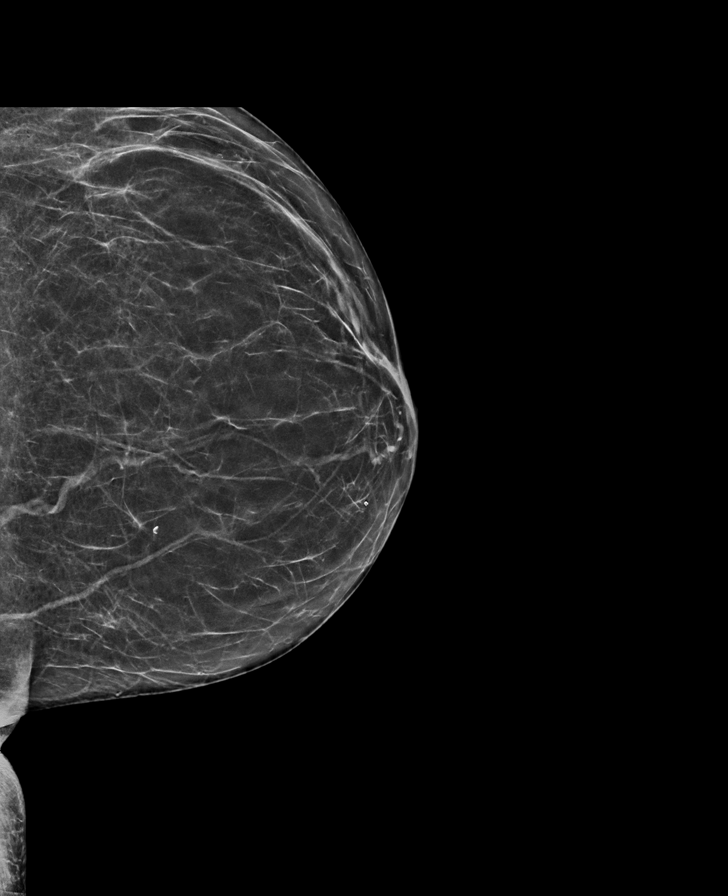

[L CC synth-2D (2 of 2)]
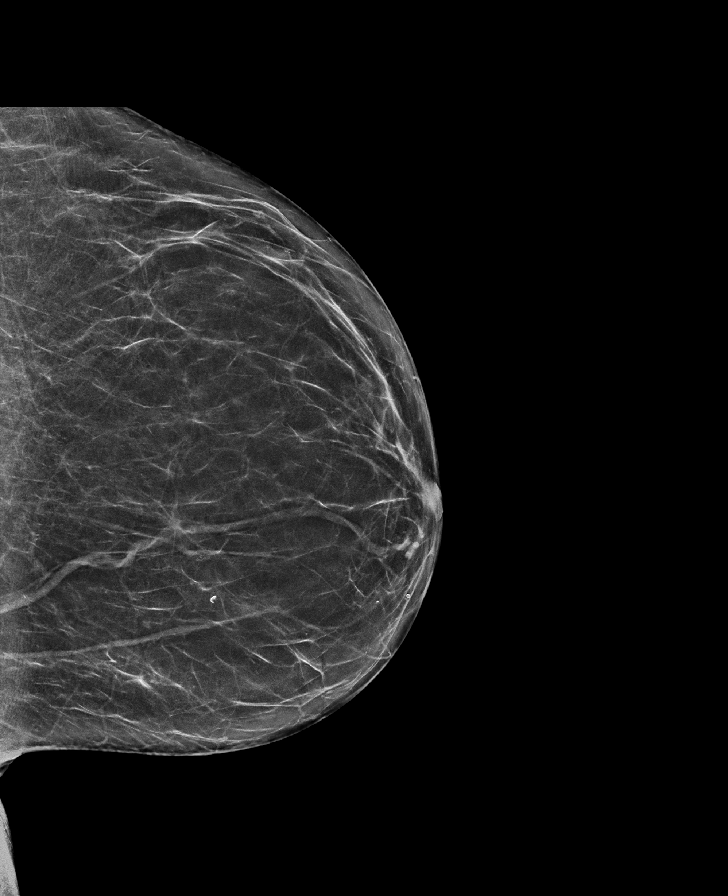

[R MLO tomo · 2 of 79 frames shown]
[frame 26/79]
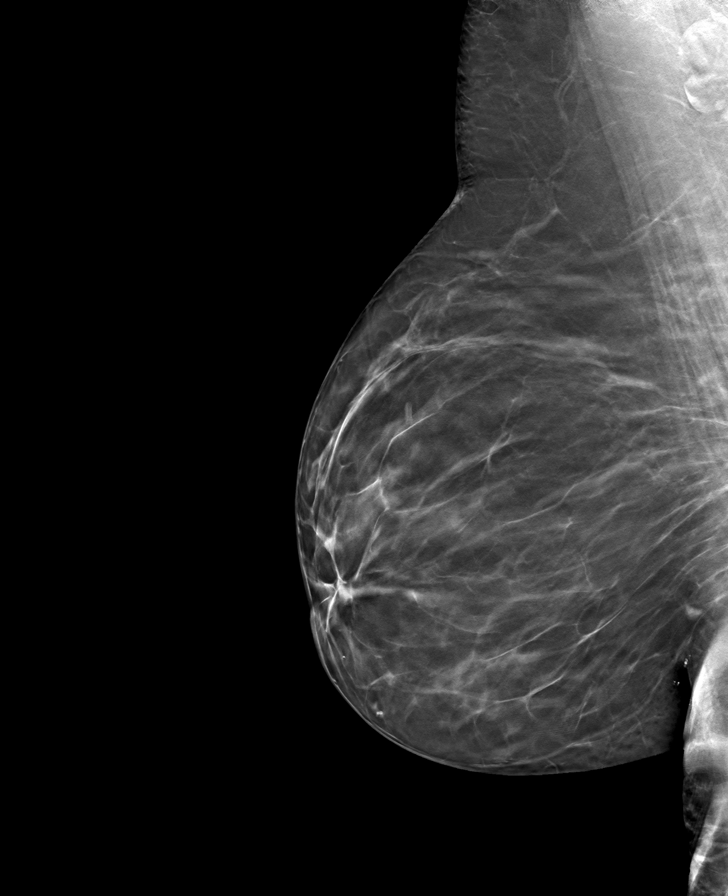
[frame 40/79]
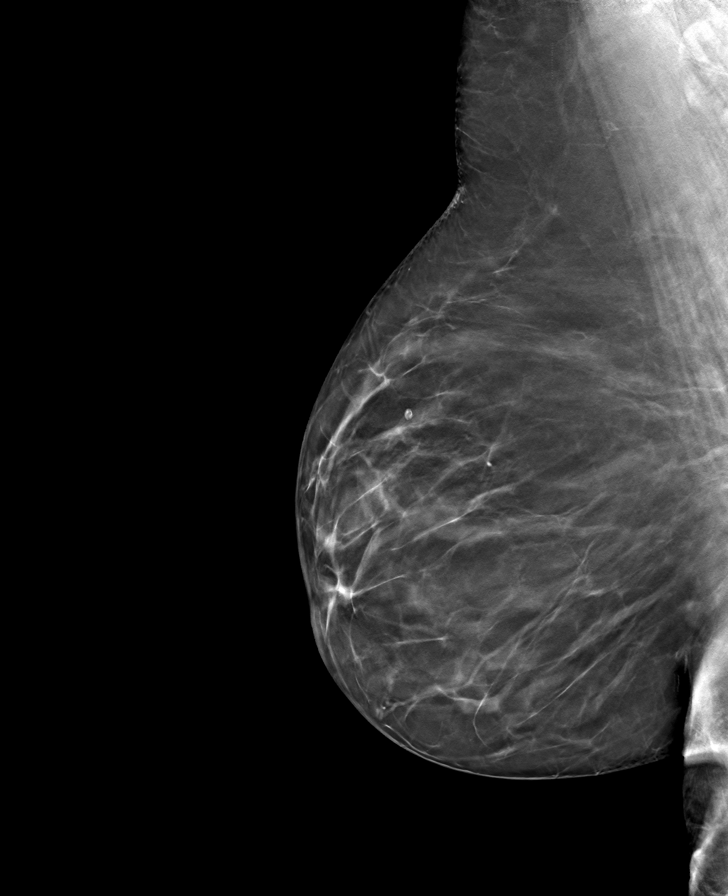

[L CC tomo (1 of 2) · tomo slice 30/59.0]
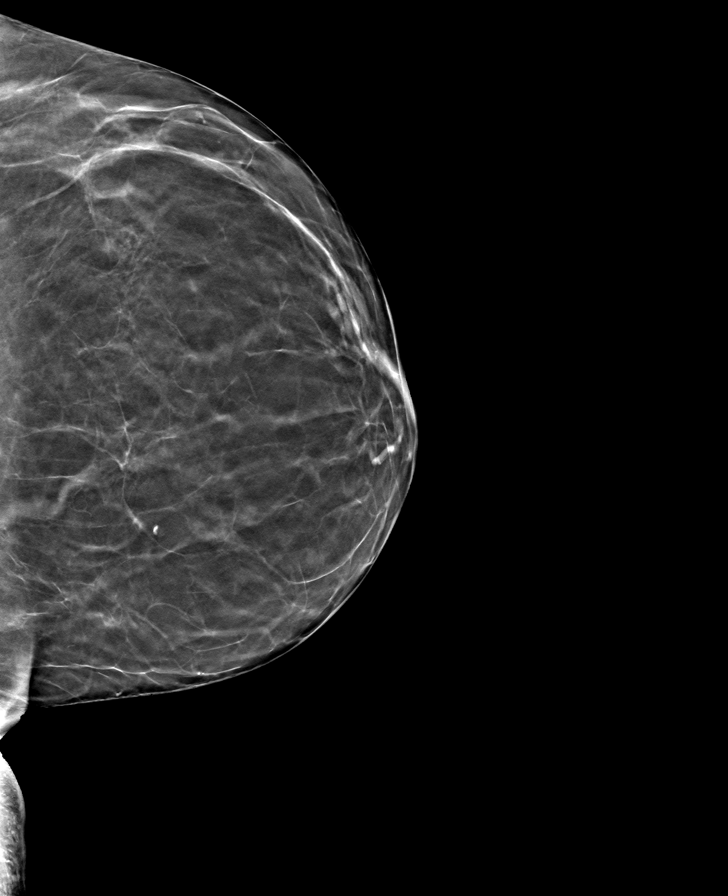

[L CC tomo (2 of 2) · tomo slice 33/66.0]
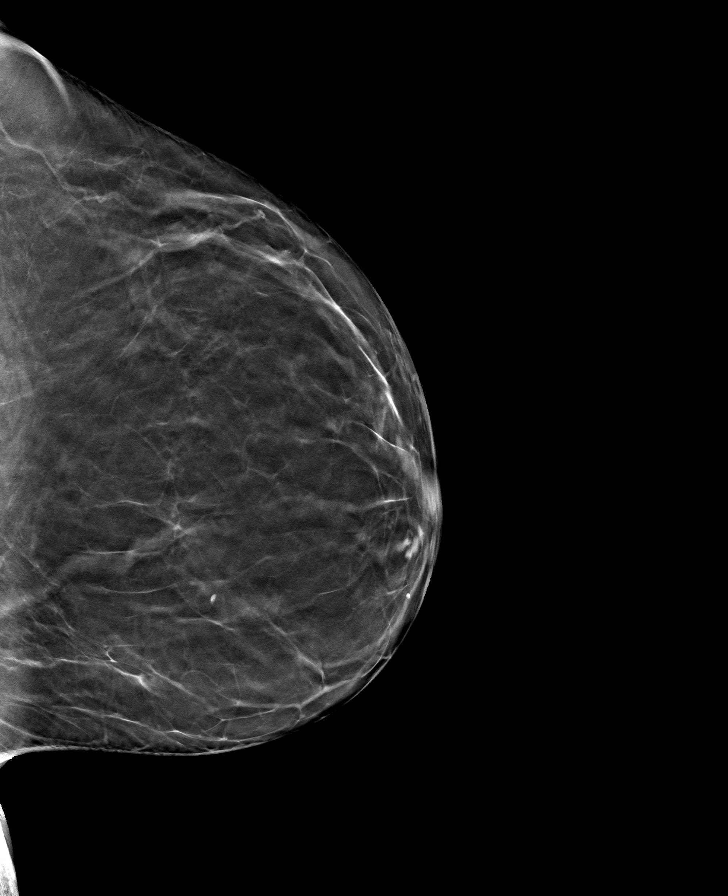

[R CC tomo · tomo slice 35/70.0]
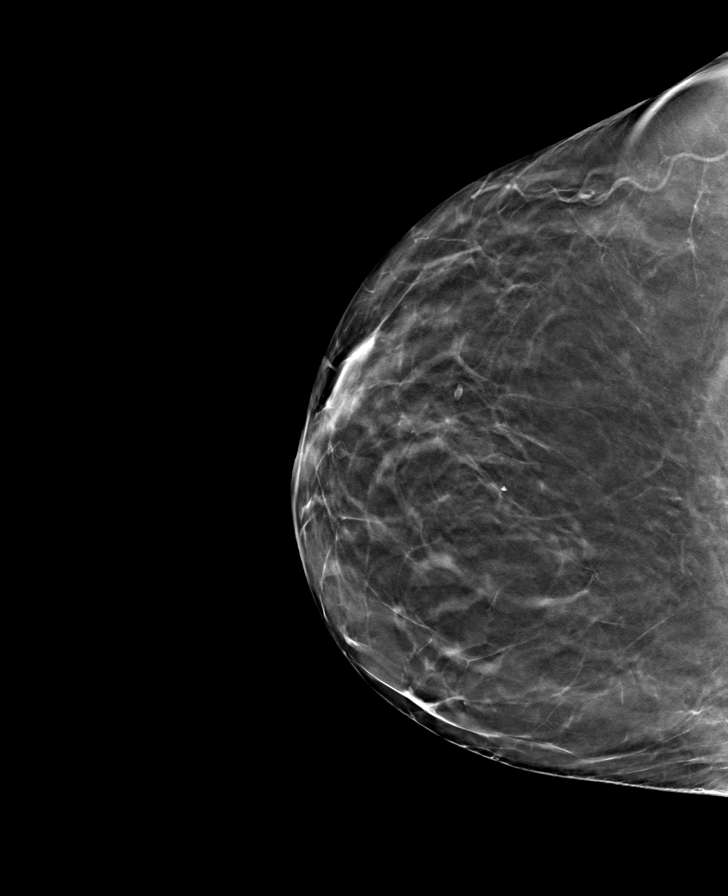

[8 of 23 positions shown; findings below may reference images not displayed]

ACR Breast Density Category b: There are scattered areas of
fibroglandular density.
FINDINGS: There are no findings suspicious for malignancy.
IMPRESSION: No mammographic evidence of malignancy. A result letter of this
screening mammogram will be mailed directly to the patient.

RECOMMENDATION:
Screening mammogram in one year. (Code:[BY])

BI-RADS CATEGORY  1: Negative.

## 2021-02-20 DIAGNOSIS — H401131 Primary open-angle glaucoma, bilateral, mild stage: Secondary | ICD-10-CM | POA: Diagnosis not present

## 2021-04-18 DIAGNOSIS — F322 Major depressive disorder, single episode, severe without psychotic features: Secondary | ICD-10-CM | POA: Diagnosis not present

## 2021-04-18 DIAGNOSIS — Z1211 Encounter for screening for malignant neoplasm of colon: Secondary | ICD-10-CM | POA: Diagnosis not present

## 2021-04-18 DIAGNOSIS — Z0000 Encounter for general adult medical examination without abnormal findings: Secondary | ICD-10-CM | POA: Diagnosis not present

## 2021-04-18 DIAGNOSIS — E1122 Type 2 diabetes mellitus with diabetic chronic kidney disease: Secondary | ICD-10-CM | POA: Diagnosis not present

## 2021-04-18 DIAGNOSIS — R0609 Other forms of dyspnea: Secondary | ICD-10-CM | POA: Diagnosis not present

## 2021-04-18 DIAGNOSIS — I10 Essential (primary) hypertension: Secondary | ICD-10-CM | POA: Diagnosis not present

## 2021-04-18 DIAGNOSIS — K219 Gastro-esophageal reflux disease without esophagitis: Secondary | ICD-10-CM | POA: Diagnosis not present

## 2021-04-18 DIAGNOSIS — E7800 Pure hypercholesterolemia, unspecified: Secondary | ICD-10-CM | POA: Diagnosis not present

## 2021-04-18 DIAGNOSIS — D649 Anemia, unspecified: Secondary | ICD-10-CM | POA: Diagnosis not present

## 2021-04-18 DIAGNOSIS — M8580 Other specified disorders of bone density and structure, unspecified site: Secondary | ICD-10-CM | POA: Diagnosis not present

## 2021-04-20 DIAGNOSIS — Z1211 Encounter for screening for malignant neoplasm of colon: Secondary | ICD-10-CM | POA: Diagnosis not present

## 2021-05-10 DIAGNOSIS — N183 Chronic kidney disease, stage 3 unspecified: Secondary | ICD-10-CM | POA: Diagnosis not present

## 2021-05-10 DIAGNOSIS — Z7984 Long term (current) use of oral hypoglycemic drugs: Secondary | ICD-10-CM | POA: Diagnosis not present

## 2021-05-10 DIAGNOSIS — K219 Gastro-esophageal reflux disease without esophagitis: Secondary | ICD-10-CM | POA: Diagnosis not present

## 2021-05-10 DIAGNOSIS — E1122 Type 2 diabetes mellitus with diabetic chronic kidney disease: Secondary | ICD-10-CM | POA: Diagnosis not present

## 2021-05-10 DIAGNOSIS — D509 Iron deficiency anemia, unspecified: Secondary | ICD-10-CM | POA: Diagnosis not present

## 2021-05-19 DIAGNOSIS — D649 Anemia, unspecified: Secondary | ICD-10-CM | POA: Diagnosis not present

## 2021-05-19 DIAGNOSIS — R051 Acute cough: Secondary | ICD-10-CM | POA: Diagnosis not present

## 2021-06-08 ENCOUNTER — Encounter (HOSPITAL_BASED_OUTPATIENT_CLINIC_OR_DEPARTMENT_OTHER): Payer: Self-pay | Admitting: Emergency Medicine

## 2021-06-08 ENCOUNTER — Other Ambulatory Visit: Payer: Self-pay

## 2021-06-08 DIAGNOSIS — I4581 Long QT syndrome: Secondary | ICD-10-CM | POA: Insufficient documentation

## 2021-06-08 DIAGNOSIS — Z20822 Contact with and (suspected) exposure to covid-19: Secondary | ICD-10-CM | POA: Insufficient documentation

## 2021-06-08 DIAGNOSIS — R112 Nausea with vomiting, unspecified: Secondary | ICD-10-CM | POA: Insufficient documentation

## 2021-06-08 DIAGNOSIS — N183 Chronic kidney disease, stage 3 unspecified: Secondary | ICD-10-CM | POA: Insufficient documentation

## 2021-06-08 DIAGNOSIS — I509 Heart failure, unspecified: Secondary | ICD-10-CM | POA: Insufficient documentation

## 2021-06-08 DIAGNOSIS — I10 Essential (primary) hypertension: Secondary | ICD-10-CM | POA: Diagnosis not present

## 2021-06-08 DIAGNOSIS — E860 Dehydration: Secondary | ICD-10-CM | POA: Diagnosis not present

## 2021-06-08 DIAGNOSIS — I130 Hypertensive heart and chronic kidney disease with heart failure and stage 1 through stage 4 chronic kidney disease, or unspecified chronic kidney disease: Secondary | ICD-10-CM | POA: Diagnosis not present

## 2021-06-08 DIAGNOSIS — D649 Anemia, unspecified: Secondary | ICD-10-CM | POA: Diagnosis not present

## 2021-06-08 DIAGNOSIS — Z7984 Long term (current) use of oral hypoglycemic drugs: Secondary | ICD-10-CM | POA: Diagnosis not present

## 2021-06-08 DIAGNOSIS — Z79899 Other long term (current) drug therapy: Secondary | ICD-10-CM | POA: Insufficient documentation

## 2021-06-08 DIAGNOSIS — E119 Type 2 diabetes mellitus without complications: Secondary | ICD-10-CM | POA: Insufficient documentation

## 2021-06-08 DIAGNOSIS — R109 Unspecified abdominal pain: Secondary | ICD-10-CM | POA: Diagnosis not present

## 2021-06-08 DIAGNOSIS — R531 Weakness: Secondary | ICD-10-CM | POA: Diagnosis present

## 2021-06-08 LAB — COMPREHENSIVE METABOLIC PANEL
ALT: 17 U/L (ref 0–44)
AST: 17 U/L (ref 15–41)
Albumin: 4.2 g/dL (ref 3.5–5.0)
Alkaline Phosphatase: 44 U/L (ref 38–126)
Anion gap: 14 (ref 5–15)
BUN: 36 mg/dL — ABNORMAL HIGH (ref 8–23)
CO2: 23 mmol/L (ref 22–32)
Calcium: 8.6 mg/dL — ABNORMAL LOW (ref 8.9–10.3)
Chloride: 97 mmol/L — ABNORMAL LOW (ref 98–111)
Creatinine, Ser: 1.21 mg/dL — ABNORMAL HIGH (ref 0.44–1.00)
GFR, Estimated: 47 mL/min — ABNORMAL LOW (ref >60)
Glucose, Bld: 202 mg/dL — ABNORMAL HIGH (ref 70–99)
Potassium: 3.5 mmol/L (ref 3.5–5.1)
Sodium: 134 mmol/L — ABNORMAL LOW (ref 135–145)
Total Bilirubin: 0.7 mg/dL (ref 0.3–1.2)
Total Protein: 7.1 g/dL (ref 6.5–8.1)

## 2021-06-08 LAB — LIPASE, BLOOD: Lipase: 20 U/L (ref 11–51)

## 2021-06-08 LAB — CBC
HCT: 36.3 % (ref 36.0–46.0)
Hemoglobin: 11.5 g/dL — ABNORMAL LOW (ref 12.0–15.0)
MCH: 27.5 pg (ref 26.0–34.0)
MCHC: 31.7 g/dL (ref 30.0–36.0)
MCV: 86.8 fL (ref 80.0–100.0)
Platelets: 277 10*3/uL (ref 150–400)
RBC: 4.18 MIL/uL (ref 3.87–5.11)
RDW: 17.1 % — ABNORMAL HIGH (ref 11.5–15.5)
WBC: 12.7 10*3/uL — ABNORMAL HIGH (ref 4.0–10.5)
nRBC: 0 % (ref 0.0–0.2)

## 2021-06-08 LAB — RESP PANEL BY RT-PCR (FLU A&B, COVID) ARPGX2
Influenza A by PCR: NEGATIVE
Influenza B by PCR: NEGATIVE
SARS Coronavirus 2 by RT PCR: NEGATIVE

## 2021-06-08 NOTE — ED Triage Notes (Signed)
Pt via pov from home with emesis since last night. She was seen by pcp and was told she needs fluids and magnesium, as it was too low. She has not had an appetite today. Pt alert & oriented, nad noted.

## 2021-06-09 ENCOUNTER — Emergency Department (HOSPITAL_BASED_OUTPATIENT_CLINIC_OR_DEPARTMENT_OTHER): Payer: Medicare Other

## 2021-06-09 ENCOUNTER — Encounter (HOSPITAL_BASED_OUTPATIENT_CLINIC_OR_DEPARTMENT_OTHER): Payer: Self-pay

## 2021-06-09 ENCOUNTER — Emergency Department (HOSPITAL_BASED_OUTPATIENT_CLINIC_OR_DEPARTMENT_OTHER)
Admission: EM | Admit: 2021-06-09 | Discharge: 2021-06-09 | Disposition: A | Discharge status: Home / Self Care | Payer: Medicare Other | Attending: Emergency Medicine | Admitting: Emergency Medicine

## 2021-06-09 DIAGNOSIS — I4581 Long QT syndrome: Secondary | ICD-10-CM | POA: Diagnosis not present

## 2021-06-09 DIAGNOSIS — R9431 Abnormal electrocardiogram [ECG] [EKG]: Secondary | ICD-10-CM

## 2021-06-09 DIAGNOSIS — I441 Atrioventricular block, second degree: Secondary | ICD-10-CM

## 2021-06-09 DIAGNOSIS — K529 Noninfective gastroenteritis and colitis, unspecified: Secondary | ICD-10-CM | POA: Diagnosis not present

## 2021-06-09 DIAGNOSIS — R112 Nausea with vomiting, unspecified: Secondary | ICD-10-CM | POA: Diagnosis present

## 2021-06-09 DIAGNOSIS — R1110 Vomiting, unspecified: Secondary | ICD-10-CM | POA: Diagnosis not present

## 2021-06-09 DIAGNOSIS — E860 Dehydration: Secondary | ICD-10-CM

## 2021-06-09 LAB — BASIC METABOLIC PANEL
Anion gap: 11 (ref 5–15)
BUN: 33 mg/dL — ABNORMAL HIGH (ref 8–23)
CO2: 23 mmol/L (ref 22–32)
Calcium: 7.6 mg/dL — ABNORMAL LOW (ref 8.9–10.3)
Chloride: 99 mmol/L (ref 98–111)
Creatinine, Ser: 1.2 mg/dL — ABNORMAL HIGH (ref 0.44–1.00)
GFR, Estimated: 48 mL/min — ABNORMAL LOW (ref >60)
Glucose, Bld: 208 mg/dL — ABNORMAL HIGH (ref 70–99)
Potassium: 3.4 mmol/L — ABNORMAL LOW (ref 3.5–5.1)
Sodium: 133 mmol/L — ABNORMAL LOW (ref 135–145)

## 2021-06-09 LAB — MAGNESIUM
Magnesium: 0.7 mg/dL — CL (ref 1.7–2.4)
Magnesium: 2.1 mg/dL (ref 1.7–2.4)

## 2021-06-09 LAB — TROPONIN I (HIGH SENSITIVITY)
Troponin I (High Sensitivity): 32 ng/L — ABNORMAL HIGH (ref <18)
Troponin I (High Sensitivity): 29 ng/L — ABNORMAL HIGH (ref <18)

## 2021-06-09 IMAGING — CT CT ABD-PELV W/ CM
2 of 5 series · 15 of 46 positions shown, 17 images · IV contrast (APPLIED)
Comparison: None.

CLINICAL DATA: Nausea and vomiting.

EXAM:
CT ABDOMEN AND PELVIS WITH CONTRAST
TECHNIQUE: Multidetector CT imaging of the abdomen and pelvis was performed
using the standard protocol following bolus administration of
intravenous contrast.
CONTRAST:  75mL OMNIPAQUE IOHEXOL 300 MG/ML  SOLN

[Series 2: abd pel w · axial · 0.92mm/px · z∈[+837,+1247]mm · 12 of 94 slices shown, 14 images]
[im 6/94  soft-tissue]
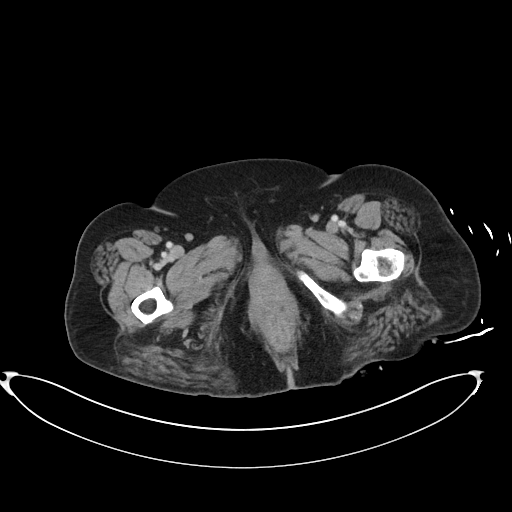
[im 6/94  bone]
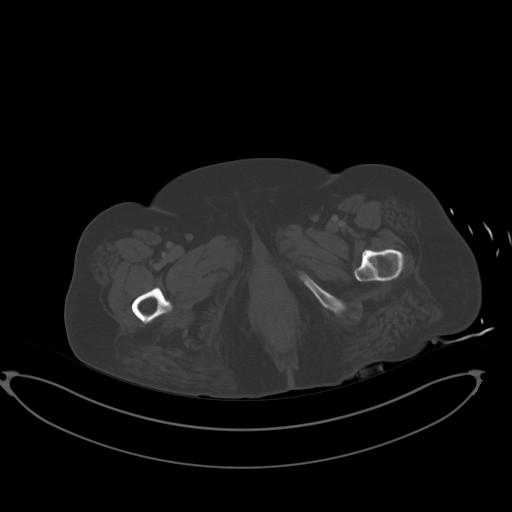
[im 16/94  soft-tissue]
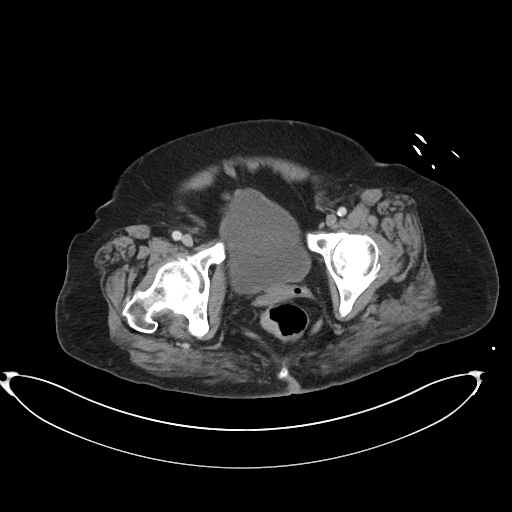
[im 21/94  soft-tissue]
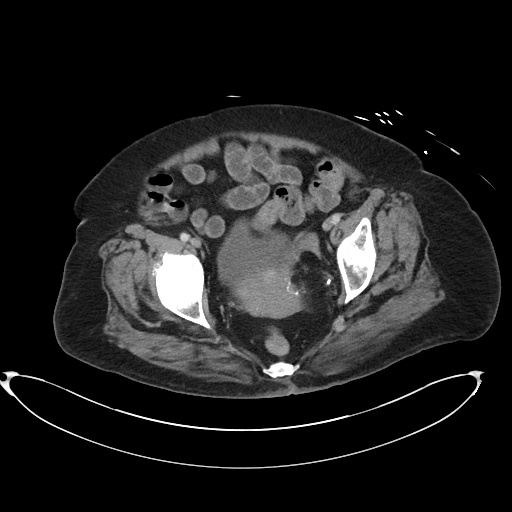
[im 26/94  soft-tissue]
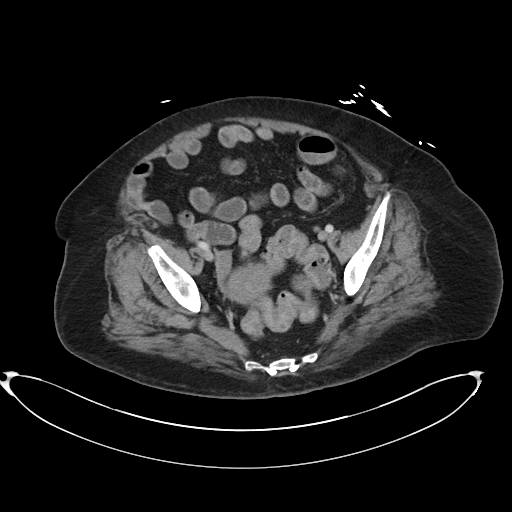
[im 37/94  soft-tissue]
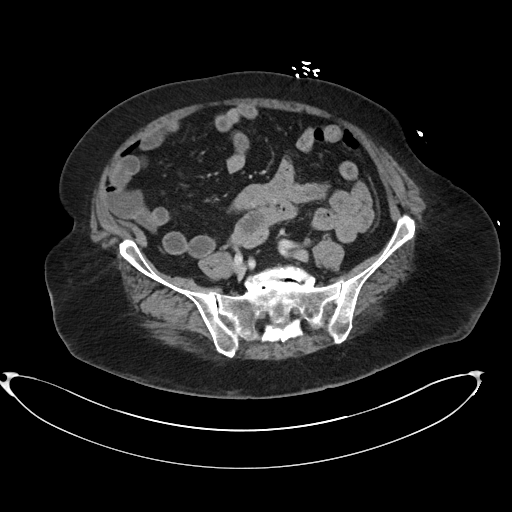
[im 42/94  soft-tissue]
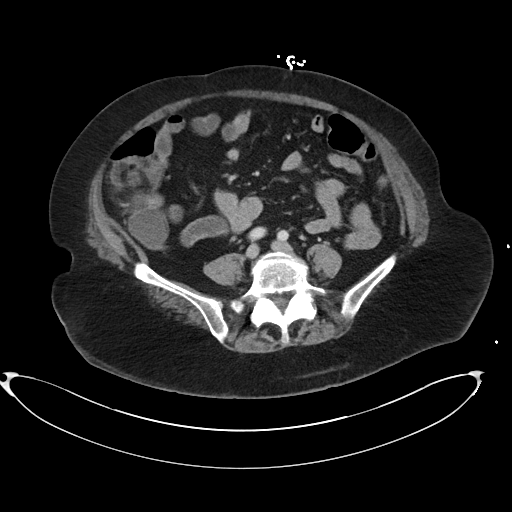
[im 52/94  soft-tissue]
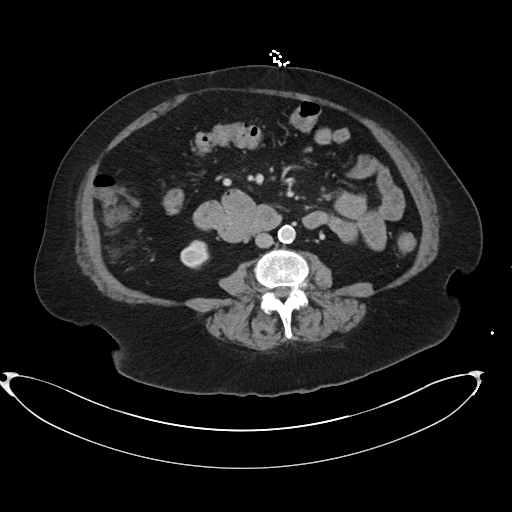
[im 57/94  soft-tissue]
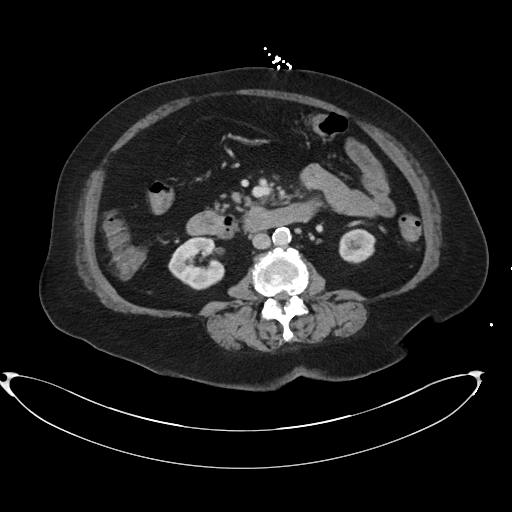
[im 68/94  soft-tissue]
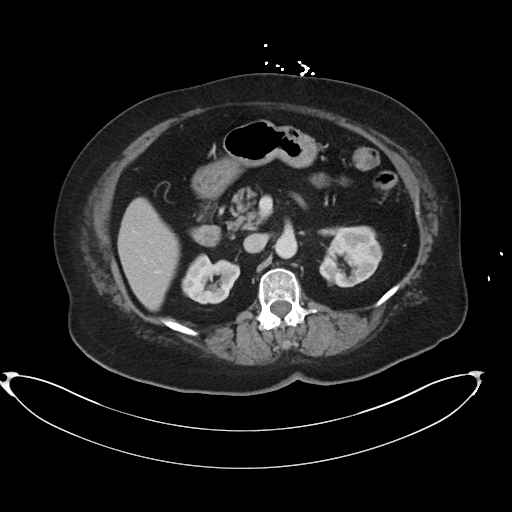
[im 68/94  bone]
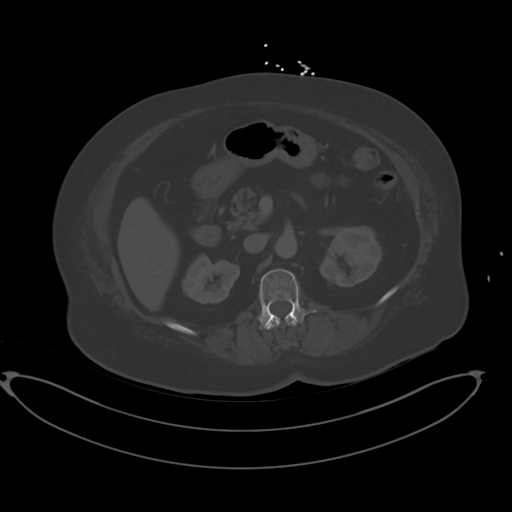
[im 73/94  soft-tissue]
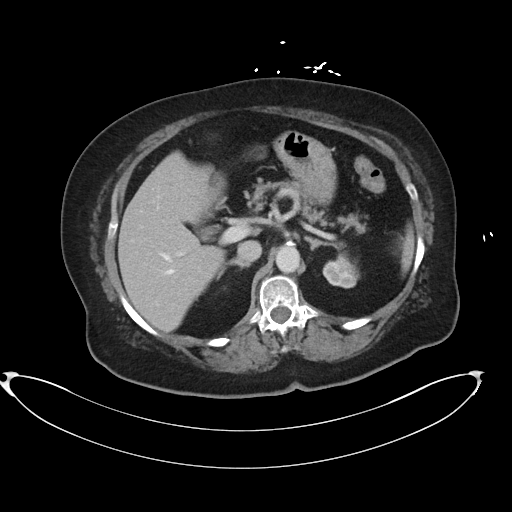
[im 78/94  soft-tissue]
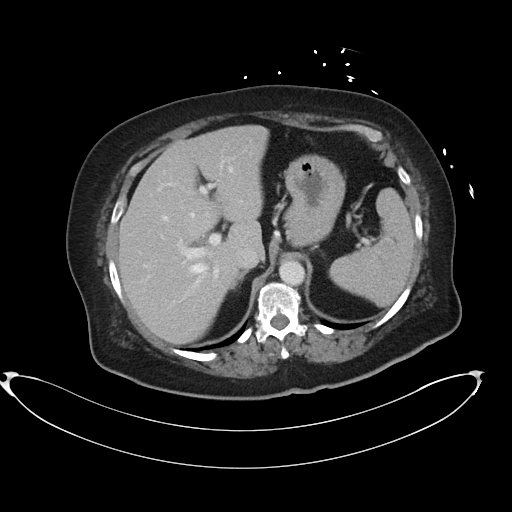
[im 88/94  soft-tissue]
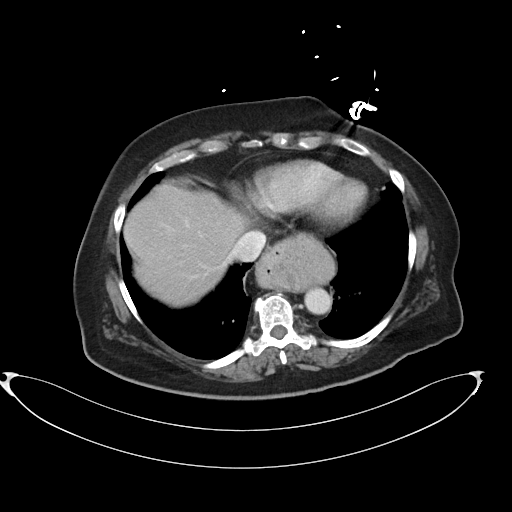

[Series 5: coronal · coronal · 0.86mm/px · 3 of 107 slices shown]
[im 36/107  soft-tissue]
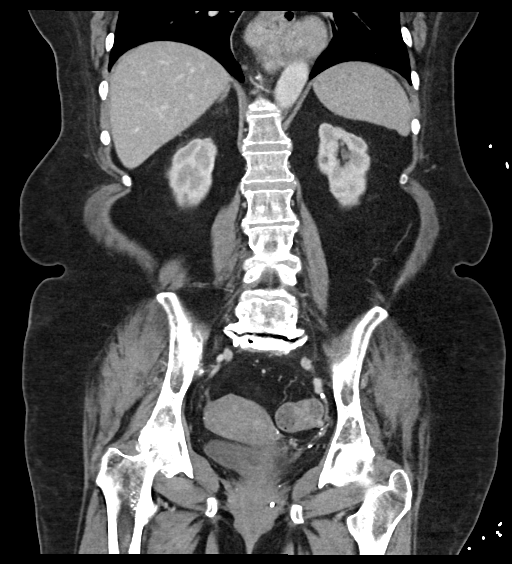
[im 48/107  soft-tissue]
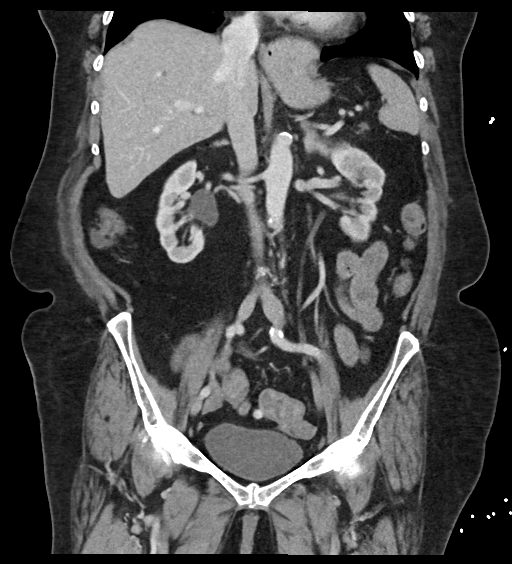
[im 59/107  soft-tissue]
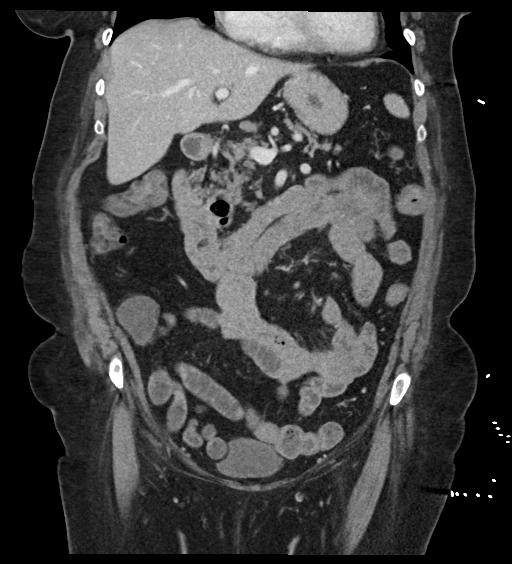

[15 of 46 positions shown; findings below may reference images not displayed]

FINDINGS: Lower chest: There are numerous bilateral pulmonary nodules
worrisome for metastases. Largest on left is in the lower lobe and
measures 11 mm. Largest on the right is in the lower lobe and
measures 6.6 mm.

Others are smaller. There is mild cardiomegaly with no pericardial
effusion. There is a moderate-sized hiatal hernia.

Hepatobiliary: 16 cm in length slightly steatotic liver. There is no
mass enhancement.

Pancreas: Moderately atrophic, otherwise unremarkable without mass
or ductal dilatation.

Spleen: Normal in size and enhancement.

Adrenals/Urinary Tract: There is no adrenal mass. There is mild
bilateral renal cortical thinning. There are a few bilateral
subcentimeter too small to characterize hypodensities, statistically
probably representing cysts. There is a 2 mm nonobstructing caliceal
stone in the inferior pole left kidney. There is no obstructing
stone or hydroureteronephrosis and no thickening of the bladder.

Stomach/Bowel: 2.4 cm diverticulum of the distal descending
duodenum. There is fold thickening in the stomach and some of the
left abdominal small bowel but no inflammatory changes or
small-bowel obstruction. There is a normal caliber appendix.

Scattered fluid in the ascending colon is noted and lipomatous
hypertrophy of the ileocecal valve. There are scattered colonic
diverticula but no wall thickening inflammatory changes through the
sigmoid.

There is a questionable eccentric thickening of the right wall of
the rectum on series 2 axial image 80, coronal reconstruction image
25. There are no inflammatory changes.

Vascular/Lymphatic: Aortic atherosclerosis. No enlarged abdominal or
pelvic lymph nodes.

Reproductive: The uterus and ovaries are not enlarged. There is a 2
cm submucosal fibroid in the body of the uterus on the left.
Multiple pelvic phleboliths.

Other: Small umbilical fat hernia. No free air, hemorrhage or fluid.

Musculoskeletal: There is osteopenia with degenerative changes of
the spine and secondary acquired spinal stenosis at L3-4 and L4-5,
to some extent at L5-S1 also. No destructive bone lesion is seen.
IMPRESSION: 1. Gastroenteritis without evidence of bowel obstruction or
inflammation.
2. Moderate-sized hiatal hernia.  Small umbilical fat hernia.
3. Innumerable lung base nodules worrisome for metastases.
4. Questionable eccentric right-sided rectal wall thickening.
Possible this could be adherent stool or a focal contraction. The
study could be repeated with enteric contrast to see if this
persists, barium enema could be obtained or colonoscopy.
5. Subcentimeter too small to characterize hypodensities in both
kidneys with mild cortical thinning.
6. Slightly steatotic liver.
7. 2 cm fibroid in the left body of uterus.
8. Aortic atherosclerosis.  Mild cardiomegaly.

## 2021-06-09 MED ORDER — POTASSIUM CHLORIDE CRYS ER 20 MEQ PO TBCR
40.0000 meq | EXTENDED_RELEASE_TABLET | Freq: Once | ORAL | Status: AC
  Administered 2021-06-09: 04:00:00 40 meq via ORAL
  Filled 2021-06-09: qty 2

## 2021-06-09 MED ORDER — POTASSIUM CHLORIDE ER 10 MEQ PO TBCR: 10.0000 meq | EXTENDED_RELEASE_TABLET | Freq: Every day | ORAL | 0 refills | Status: DC

## 2021-06-09 MED ORDER — IOHEXOL 300 MG/ML  SOLN
75.0000 mL | Freq: Once | INTRAMUSCULAR | Status: AC | PRN
  Administered 2021-06-09: 03:00:00 75 mL via INTRAVENOUS

## 2021-06-09 MED ORDER — MAGNESIUM SULFATE 2 GM/50ML IV SOLN
2.0000 g | Freq: Once | INTRAVENOUS | Status: AC
  Administered 2021-06-09: 02:00:00 2 g via INTRAVENOUS
  Filled 2021-06-09: qty 50

## 2021-06-09 MED ORDER — MAGNESIUM OXIDE 400 MG PO CAPS: 400.0000 mg | ORAL_CAPSULE | Freq: Every day | ORAL | 0 refills | Status: DC

## 2021-06-09 MED ORDER — ONDANSETRON HCL 4 MG/2ML IJ SOLN
4.0000 mg | Freq: Once | INTRAMUSCULAR | Status: DC
  Filled 2021-06-09: qty 2

## 2021-06-09 MED ORDER — CALCIUM CARBONATE ANTACID 500 MG PO CHEW: 1.0000 | CHEWABLE_TABLET | Freq: Two times a day (BID) | ORAL | 0 refills | Status: AC

## 2021-06-09 MED ORDER — METRONIDAZOLE 500 MG PO TABS: 500.0000 mg | ORAL_TABLET | Freq: Three times a day (TID) | ORAL | 0 refills | Status: AC

## 2021-06-09 MED ORDER — SODIUM CHLORIDE 0.9 % IV BOLUS
1000.0000 mL | Freq: Once | INTRAVENOUS | Status: AC
  Administered 2021-06-09: 01:00:00 1000 mL via INTRAVENOUS

## 2021-06-09 MED ORDER — CIPROFLOXACIN HCL 500 MG PO TABS: 500.0000 mg | ORAL_TABLET | Freq: Two times a day (BID) | ORAL | 0 refills | Status: AC

## 2021-06-09 MED ORDER — MAGNESIUM SULFATE 2 GM/50ML IV SOLN
2.0000 g | Freq: Once | INTRAVENOUS | Status: AC
  Administered 2021-06-09: 04:00:00 2 g via INTRAVENOUS
  Filled 2021-06-09: qty 50

## 2021-06-09 MED ORDER — METOCLOPRAMIDE HCL 5 MG/ML IJ SOLN
5.0000 mg | Freq: Once | INTRAMUSCULAR | Status: AC
  Administered 2021-06-09: 01:00:00 5 mg via INTRAVENOUS
  Filled 2021-06-09: qty 2

## 2021-06-09 NOTE — ED Provider Notes (Signed)
[facility] DEPT Provider Note   CSN: [id number] Arrival date & time: 06/08/21  1956     History Chief Complaint  Patient presents with   Emesis    Michaela Santana is a 75 y.o. female.  Patient with a history of diabetes, GERD, CKD, anemia presenting with a 3-day history of nausea and vomiting.  States unable to tolerate p.o. since the evening of December 27.  Had multiple episodes of vomiting throughout the 27th and 28th and 1 episode today.  Unable to quantify how many.  Emesis has been nonbloody and nonbilious.  No diarrhea.  Last bowel movement was today is normal.  No fever.  Has had generalized weakness but no dizziness or lightheadedness.  Has no appetite and not wanting to eat or drink anything.  No pain with urination or blood in the urine.  No travel or sick contacts.  Saw her PCP today and had blood work which showed a magnesium of 0.7 and was sent to the ED for IV fluids and IV magnesium Denies any significant abdominal pain but has soreness all over from vomiting.  Denies chest pain or shortness of breath.  Denies feeling dizzy or lightheaded. Denies any travel or sick contacts or recent antibiotic use   The history is provided by the patient.  Emesis Associated symptoms: no abdominal pain, no arthralgias, no cough, no fever, no headaches and no myalgias       Past Medical History:  Diagnosis Date   Anemia    Arthritis    "probably in hands" (07/17/2016)   CKD (chronic kidney disease), stage III (HCC)    Depression    Family history of adverse reaction to anesthesia    "her father had some issues when he had his gallbladder taken out when he was in his late 57s"   GERD (gastroesophageal reflux disease)    Glaucoma    open angle, bilateral   High cholesterol    History of kidney stones    Hypertension    Lumbago with sciatica, right side    Sepsis ([place]) 07/2016   d/t flu   Type II diabetes mellitus ([place])    Vitamin D deficiency      Patient Active Problem List   Diagnosis Date Noted   Spondylosis without myelopathy or radiculopathy, lumbar region 10/17/2016   CHF (congestive heart failure) ([place]) 07/17/2016   Sepsis ([place]) 07/17/2016   Influenza 07/17/2016   Congestive heart failure (HCC)    Hypotension    Urinary tract infection with hematuria     Past Surgical History:  Procedure Laterality Date   BREAST BIOPSY  1990   "? side; it wasn't anything"   CATARACT EXTRACTION, BILATERAL  2021   CHOLECYSTECTOMY OPEN  1971   TUBAL LIGATION  1978     OB History   No obstetric history on file.     Family History  Problem Relation Age of Onset   Dementia Mother    Diabetes Mother    Stroke Father    Hypertension Father    Diabetes Sister    Hypertension Sister    Heart disease Paternal Grandfather    Breast cancer Neg Hx     Social History   Tobacco Use   Smoking status: Never   Smokeless tobacco: Never  Substance Use Topics   Alcohol use: Never   Drug use: Never    Home Medications Prior to Admission medications   Medication Sig Start Date End Date Taking? Authorizing Provider  amLODipine (NORVASC) 5 MG tablet Take 5 mg by mouth daily. 01/20/20   [provider]  atorvastatin (LIPITOR) 10 MG tablet Take 10 mg by mouth daily.    [provider]  Blood Glucose Monitoring Suppl (FREESTYLE LITE) DEVI USE UTD 07/26/16   [provider]  cetirizine (ZYRTEC) 10 MG chewable tablet Chew 10 mg by mouth daily.    [provider]  econazole nitrate 1 % cream Apply 1 application topically daily.     [provider]  FREESTYLE LITE test strip USE TO CHECK FASTING GLUCOSE IN THE MORNING AND 2 HOURS AFTER DINNER OR LUNCH 07/26/16   [provider]  gabapentin (NEURONTIN) 300 MG capsule Take 300 mg by mouth daily. 01/01/20   [provider]  glipiZIDE (GLUCOTROL XL) 5 MG 24 hr tablet  08/13/16   [provider]  Lancets (FREESTYLE) lancets   07/26/16   [provider]  lansoprazole (PREVACID) 30 MG capsule Take 30 mg by mouth daily at 12 noon.    [provider]  losartan-hydrochlorothiazide (HYZAAR) 100-25 MG tablet Take 1 tablet by mouth daily. 01/20/20   [provider]  magnesium oxide (MAG-OX) 400 MG tablet Take 400-800 mg by mouth See admin instructions. 800mg  in am, 400mg  in afternoon and in pm    [provider]  metFORMIN (GLUCOPHAGE-XR) 500 MG 24 hr tablet  10/16/16   [provider]  Multiple Vitamin (MULTIVITAMIN) tablet Take 1 tablet by mouth daily.    [provider]  sertraline (ZOLOFT) 100 MG tablet Take 100 mg by mouth daily. 01/25/20   [provider]  Vitamin D, Ergocalciferol, (DRISDOL) 50000 units CAPS capsule Take 50,000 Units by mouth every 7 (seven) days.    [provider]    Allergies    Codeine and Alprazolam  Review of Systems   Review of Systems  Constitutional:  Positive for activity change, appetite change and fatigue. Negative for fever.  HENT:  Negative for congestion and rhinorrhea.   Respiratory:  Negative for cough, chest tightness and shortness of breath.   Cardiovascular:  Negative for chest pain.  Gastrointestinal:  Positive for nausea and vomiting. Negative for abdominal pain.  Genitourinary:  Negative for dysuria and hematuria.  Musculoskeletal:  Negative for arthralgias, back pain and myalgias.  Skin:  Negative for rash.  Neurological:  Positive for weakness. Negative for dizziness and headaches.   all other systems are negative except as noted in the HPI and PMH.   Physical Exam Updated Vital Signs BP (!) 121/91 (BP Location: Right Arm)    Pulse 76    Temp 98.6 F (37 C)    Resp 16    Ht 5\' 1"  (1.549 m)    Wt 78.9 kg    SpO2 95%    BMI 32.88 kg/m   Physical Exam Vitals and nursing note reviewed.  Constitutional:      General: She is not in acute distress.    Appearance: She is well-developed.  HENT:      Head: Normocephalic and atraumatic.     Mouth/Throat:     Pharynx: No oropharyngeal exudate.  Eyes:     Conjunctiva/sclera: Conjunctivae normal.     Pupils: Pupils are equal, round, and reactive to light.  Neck:     Comments: No meningismus. Cardiovascular:     Rate and Rhythm: Normal rate and regular rhythm.     Heart sounds: Normal heart sounds. No murmur heard. Pulmonary:     Effort:  Pulmonary effort is normal. No respiratory distress.     Breath sounds: Normal breath sounds.  Abdominal:     Palpations: Abdomen is soft.     Tenderness: There is abdominal tenderness. There is no guarding or rebound.     Comments: Mild diffuse tenderness  Musculoskeletal:        General: No tenderness. Normal range of motion.     Cervical back: Normal range of motion and neck supple.  Skin:    General: Skin is warm.  Neurological:     Mental Status: She is alert and oriented to person, place, and time.     Cranial Nerves: No cranial nerve deficit.     Motor: No abnormal muscle tone.     Coordination: Coordination normal.     Comments:  5/5 strength throughout. CN 2-12 intact.Equal grip strength.   Psychiatric:        Behavior: Behavior normal.    ED Results / Procedures / Treatments   Labs (all labs ordered are listed, but only abnormal results are displayed) Labs Reviewed  COMPREHENSIVE METABOLIC PANEL - Abnormal; Notable for the following components:      Result Value   Sodium 134 (*)    Chloride 97 (*)    Glucose, Bld 202 (*)    BUN 36 (*)    Creatinine, Ser 1.21 (*)    Calcium 8.6 (*)    GFR, Estimated 47 (*)    All other components within normal limits  CBC - Abnormal; Notable for the following components:   WBC 12.7 (*)    Hemoglobin 11.5 (*)    RDW 17.1 (*)    All other components within normal limits  MAGNESIUM - Abnormal; Notable for the following components:   Magnesium 0.7 (*)    All other components within normal limits  TROPONIN I (HIGH SENSITIVITY) - Abnormal;  Notable for the following components:   Troponin I (High Sensitivity) 32 (*)    All other components within normal limits  TROPONIN I (HIGH SENSITIVITY) - Abnormal; Notable for the following components:   Troponin I (High Sensitivity) 29 (*)    All other components within normal limits  RESP PANEL BY RT-PCR (FLU A&B, COVID) ARPGX2  LIPASE, BLOOD  URINALYSIS, ROUTINE W REFLEX MICROSCOPIC    EKG EKG Interpretation  Date/Time:  Friday June 09 2021 01:12:16 EST Ventricular Rate:  89 PR Interval:  292 QRS Duration: 88 QT Interval:  441 QTC Calculation: 537 R Axis:   -22 Text Interpretation: Sinus or ectopic atrial rhythm Prolonged PR interval Borderline left axis deviation Abnormal R-wave progression, late transition Prolonged QT interval QT prolonged Nonspecific T wave abnormality Confirmed by Ezequiel Essex [phone]) on 06/09/2021 1:15:39 AM  Radiology CT ABDOMEN PELVIS W CONTRAST  Result Date: 06/09/2021 CLINICAL DATA:  Nausea and vomiting. EXAM: CT ABDOMEN AND PELVIS WITH CONTRAST TECHNIQUE: Multidetector CT imaging of the abdomen and pelvis was performed using the standard protocol following bolus administration of intravenous contrast. CONTRAST:  30mL OMNIPAQUE IOHEXOL 300 MG/ML  SOLN COMPARISON:  None. FINDINGS: Lower chest: There are numerous bilateral pulmonary nodules worrisome for metastases. Largest on left is in the lower lobe and measures 11 mm. Largest on the right is in the lower lobe and measures 6.6 mm. Others are smaller. There is mild cardiomegaly with no pericardial effusion. There is a moderate-sized hiatal hernia. Hepatobiliary: 16 cm in length slightly steatotic liver. There is no mass enhancement. Pancreas: Moderately atrophic, otherwise unremarkable without mass or ductal dilatation. Spleen: Normal  in size and enhancement. Adrenals/Urinary Tract: There is no adrenal mass. There is mild bilateral renal cortical thinning. There are a few bilateral subcentimeter too  small to characterize hypodensities, statistically probably representing cysts. There is a 2 mm nonobstructing caliceal stone in the inferior pole left kidney. There is no obstructing stone or hydroureteronephrosis and no thickening of the bladder. Stomach/Bowel: 2.4 cm diverticulum of the distal descending duodenum. There is fold thickening in the stomach and some of the left abdominal small bowel but no inflammatory changes or small-bowel obstruction. There is a normal caliber appendix. Scattered fluid in the ascending colon is noted and lipomatous hypertrophy of the ileocecal valve. There are scattered colonic diverticula but no wall thickening inflammatory changes through the sigmoid. There is a questionable eccentric thickening of the right wall of the rectum on series 2 axial image 80, coronal reconstruction image 25. There are no inflammatory changes. Vascular/Lymphatic: Aortic atherosclerosis. No enlarged abdominal or pelvic lymph nodes. Reproductive: The uterus and ovaries are not enlarged. There is a 2 cm submucosal fibroid in the body of the uterus on the left. Multiple pelvic phleboliths. Other: Small umbilical fat hernia. No free air, hemorrhage or fluid. 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 No destructive bone lesion is seen. IMPRESSION: 1. Gastroenteritis without evidence of bowel obstruction or inflammation. 2. Moderate-sized hiatal hernia.  Small umbilical fat hernia. 3. Innumerable lung base nodules worrisome for metastases. 4. Questionable eccentric right-sided rectal wall thickening. Possible this could be adherent stool or a focal contraction. The study could be repeated with enteric contrast to see if this persists, barium enema could be obtained or colonoscopy. 5. Subcentimeter too small to characterize hypodensities in both kidneys with mild cortical thinning. 6.  Slightly steatotic liver. 7. 2 cm fibroid in the left body of uterus. 8. Aortic atherosclerosis.  Mild cardiomegaly. Electronically Signed   By: Telford Nab M.D.   On: 06/09/2021 03:34    Procedures Procedures   Medications Ordered in ED Medications  sodium chloride 0.9 % bolus 1,000 mL (has no administration in time range)  ondansetron (ZOFRAN) injection 4 mg (has no administration in time range)  magnesium sulfate IVPB 2 g 50 mL (has no administration in time range)    ED Course  I have reviewed the triage vital signs and the nursing notes.  Pertinent labs & imaging results that were available during my care of the patient were reviewed by me and considered in my medical decision making (see chart for details).    MDM Rules/Calculators/A&P                         Sent by PCP with a 3-day history of nausea and vomiting and low magnesium as an outpatient.  Vital stable, no distress.  Abdomen soft without peritoneal sign  Given IV fluids as well as IV magnesium.  Magnesium is 0.7 potassium is 3.5.  Both were replaced.  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  EKG today shows prolonged QT at 537.  Remains nauseous but no vomiting in the ED.  CT scan shows evidence of gastroenteritis without bowel obstruction Patient and family informed of innumerable lung nodules and need for follow-up.  She has no history of cancer  Given prolonged QT, hypomagnesemia and hypokalemia, plan admission for correction of electrolytes and hydration.  Discussed with Dr.  Shalhoub.   After magnesium was given, repeat QTC was 446.  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 Will recheck magnesium and BMET.    Final Clinical Impression(s) / ED Diagnoses Final diagnoses:  Hypomagnesemia  Prolonged Q-T interval on ECG  Dehydration    Rx / DC Orders ED Discharge Orders     None        Alden Feagan, Annie Main, MD 06/09/21 [phone]

## 2021-06-09 NOTE — Discharge Instructions (Addendum)
Please call your cardiology office to ask for a follow-up appointment in the next 1 to 2 weeks if possible.  We talked about the fact that you have a type of arrhythmia on your EKG, that appears to be Mobitz Type 1 or [place].  I have included some information to read over.  It is not clear to me if this is a new issue, or if you have had this in the past.  If you do experience any sudden lightheadedness, loss of consciousness, chest pain or pressure, you need to come back to the ER immediately.  I also prescribed your electrolytes, which would include magnesium, calcium, potassium.  This is to replace electrolytes that are low on her test today.  Finally, I prescribed you antibiotics to treat for possible enteritis or inflammation of your bowels.  Please follow-up with gastroenterology next month as scheduled, you would benefit from an endoscopy and colonoscopy as screening.

## 2021-06-09 NOTE — ED Provider Notes (Signed)
I reassessed the patient this morning, as she has had her electrolytes corrected overnight.  Her nausea and vomiting is improved.  She was able to tolerate drinks all night with fluids and water.  Her magnesium is now corrected back to normal.  Her potassium is also within normal limits.  Her calcium remains a little low, and I discussed with her supplemental calcium, magnesium, and potassium while she gets through this illness.  CT scan did show some signs of enteritis, given that she did have an elevated white blood cell count, I think treatment for bacterial enteritis would be reasonable with ciprofloxacin and Flagyl.  She already has Zofran at home for nausea.  She and her daughter verbalized agreement and understanding with this plan, would strongly prefer to go home and avoid hospitalization at this point if possible.  I think this is a reasonable plan.  She ready has a GI appointment in early January for scheduled endoscopy and colonoscopy.  I also encouraged her to contact her cardiologist about the winky block type pattern noted on her EKG, does not clear if this is acute or chronic.    She otherwise has no chest pain or pressure now nor did she have any in the past suggest that this is an atypical ACS presentation.  Her troponins have been flat.  I do not see any other acute medical emergencies on her work-up and I think she is reasonably safe for discharge   Wyvonnia Dusky, MD 06/09/21 1034

## 2021-06-13 DIAGNOSIS — D175 Benign lipomatous neoplasm of intra-abdominal organs: Secondary | ICD-10-CM | POA: Diagnosis not present

## 2021-06-13 DIAGNOSIS — K317 Polyp of stomach and duodenum: Secondary | ICD-10-CM | POA: Diagnosis not present

## 2021-06-13 DIAGNOSIS — K2970 Gastritis, unspecified, without bleeding: Secondary | ICD-10-CM | POA: Diagnosis not present

## 2021-06-13 DIAGNOSIS — K449 Diaphragmatic hernia without obstruction or gangrene: Secondary | ICD-10-CM | POA: Diagnosis not present

## 2021-06-13 DIAGNOSIS — K648 Other hemorrhoids: Secondary | ICD-10-CM | POA: Diagnosis not present

## 2021-06-13 DIAGNOSIS — K5730 Diverticulosis of large intestine without perforation or abscess without bleeding: Secondary | ICD-10-CM | POA: Diagnosis not present

## 2021-06-13 DIAGNOSIS — D509 Iron deficiency anemia, unspecified: Secondary | ICD-10-CM | POA: Diagnosis not present

## 2021-06-13 DIAGNOSIS — K621 Rectal polyp: Secondary | ICD-10-CM | POA: Diagnosis not present

## 2021-06-13 DIAGNOSIS — K219 Gastro-esophageal reflux disease without esophagitis: Secondary | ICD-10-CM | POA: Diagnosis not present

## 2021-06-13 DIAGNOSIS — K2930 Chronic superficial gastritis without bleeding: Secondary | ICD-10-CM | POA: Diagnosis not present

## 2021-06-13 DIAGNOSIS — K5520 Angiodysplasia of colon without hemorrhage: Secondary | ICD-10-CM | POA: Diagnosis not present

## 2021-06-20 DIAGNOSIS — K621 Rectal polyp: Secondary | ICD-10-CM | POA: Diagnosis not present

## 2021-06-20 DIAGNOSIS — K2930 Chronic superficial gastritis without bleeding: Secondary | ICD-10-CM | POA: Diagnosis not present

## 2021-06-21 DIAGNOSIS — R918 Other nonspecific abnormal finding of lung field: Secondary | ICD-10-CM | POA: Diagnosis not present

## 2021-06-21 DIAGNOSIS — I10 Essential (primary) hypertension: Secondary | ICD-10-CM | POA: Diagnosis not present

## 2021-06-21 DIAGNOSIS — D509 Iron deficiency anemia, unspecified: Secondary | ICD-10-CM | POA: Diagnosis not present

## 2021-07-02 NOTE — Progress Notes (Signed)
Cardiology Office Note:   Date:  07/04/2021  NAME:  Michaela Santana    MRN: [id number] DOB:  Jul 09, 1945   PCP:  [name], MD  Cardiologist:  None  Electrophysiologist:  None   Referring MD: [name], *   Chief Complaint  Patient presents with   Follow-up        History of Present Illness:   Michaela Santana is a 76 y.o. female with a hx of 1AVB, DM, HTN, HLD who presents for follow-up.  She reports she is still getting short of breath.  Seen in the emergency room last month with diarrhea symptoms.  Magnesium was found to be very low by primary care physician.  Sent for replacement.  Still working up why she is hypomagnesemic.  She denies any chest pain.  She is also been found to be anemic.  Most recent hemoglobin 10.0.  Recent EGD and colonoscopy were normal per the daughter.  She is now on iron supplementation.  She has no evidence of congestive heart failure.  When in the emergency room she had episodes of Wenckebach.  This is not new.  She describes no episodes of dizziness or lightheadedness that are concerning.  No syncope is reported.  Nothing to me says her conduction disease has changed.  She reports no palpitations.  I believe her shortness of breath is likely related to anemia and deconditioning in setting of recent diarrhea.  Overall seems to be stable.  Problem List 1. DM -A1c 6.6 2. HTN 3. HLD -T chol 131, HDL 50, LDL 58, triglycerides 137 4. Obesity -BMI 33  5. 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 PACs -4.4% burden 7. PVCs  -6.2%  Past Medical History: Past Medical History:  Diagnosis Date   Anemia    Arthritis    "probably in hands" (07/17/2016)   CKD (chronic kidney disease), stage III (HCC)    Depression    Family history of adverse reaction to anesthesia    "her father had some issues when he had his gallbladder taken out when he was in his late 18s"   GERD (gastroesophageal reflux disease)    Glaucoma    open angle, bilateral   High cholesterol     History of kidney stones    Hypertension    Lumbago with sciatica, right side    Sepsis ([place]) 07/2016   d/t flu   Type II diabetes mellitus ([place])    Vitamin D deficiency     Past Surgical History: Past Surgical History:  Procedure Laterality Date   BREAST BIOPSY  1990   "? side; it wasn't anything"   CATARACT EXTRACTION, BILATERAL  2021   CHOLECYSTECTOMY OPEN  1971   TUBAL LIGATION  1978    Current Medications: Current Meds  Medication Sig   amLODipine (NORVASC) 5 MG tablet Take 5 mg by mouth daily.   atorvastatin (LIPITOR) 10 MG tablet Take 10 mg by mouth daily.   Blood Glucose Monitoring Suppl (FREESTYLE LITE) DEVI USE UTD   calcium carbonate (TUMS) 500 MG chewable tablet Chew 1 tablet (200 mg of elemental calcium total) by mouth 2 (two) times daily.   cetirizine (ZYRTEC) 10 MG chewable tablet Chew 10 mg by mouth daily.   DULoxetine HCl 40 MG CPEP Take 1 capsule by mouth daily.   econazole nitrate 1 % cream Apply 1 application topically daily.    ferrous sulfate 325 (65 FE) MG EC tablet Take 325 mg by mouth daily.   FREESTYLE LITE  test strip USE TO CHECK FASTING GLUCOSE IN THE MORNING AND 2 HOURS AFTER DINNER OR LUNCH   gabapentin (NEURONTIN) 300 MG capsule Take 300 mg by mouth daily.   glipiZIDE (GLUCOTROL XL) 5 MG 24 hr tablet Take 5 mg by mouth daily with breakfast.   Lancets (FREESTYLE) lancets    lansoprazole (PREVACID) 30 MG capsule Take 30 mg by mouth daily at 12 noon.   losartan (COZAAR) 100 MG tablet Take 100 mg by mouth daily.   magnesium oxide (MAG-OX) 400 (240 Mg) MG tablet Take 2 tablets by mouth daily.   metFORMIN (GLUCOPHAGE-XR) 500 MG 24 hr tablet Take 1 tablet in the morning and 2 tablets in the evening   Multiple Vitamin (MULTIVITAMIN) tablet Take 1 tablet by mouth daily.   potassium chloride (KLOR-CON) 10 MEQ tablet Take 1 tablet (10 mEq total) by mouth daily for 30 doses.   Vitamin D, Ergocalciferol, (DRISDOL) 50000 units CAPS capsule Take 50,000 Units by  mouth every 7 (seven) days.     Allergies:    Codeine and Alprazolam   Social History: Social History   Socioeconomic History   Marital status: Widowed    Spouse name: Not on file   Number of children: 2   Years of education: Not on file   Highest education level: Some college, no degree  Occupational History   Occupation: Environmental consultant  Tobacco Use   Smoking status: Never   Smokeless tobacco: Never  Substance and Sexual Activity   Alcohol use: Never   Drug use: Never   Sexual activity: Not on file  Other Topics Concern   Not on file  Social History Narrative   02/08/20 Lives alone   caffeine soda 1-2 daily   Social Determinants of Health   Financial Resource Strain: Not on file  Food Insecurity: Not on file  Transportation Needs: Not on file  Physical Activity: Not on file  Stress: Not on file  Social Connections: Not on file     Family History: The patient's family history includes Dementia in her mother; Diabetes in her mother and sister; Heart disease in her paternal grandfather; Hypertension in her father and sister; Stroke in her father. There is no history of Breast cancer.  ROS:   All other ROS reviewed and negative. Pertinent positives noted in the HPI.     EKGs/Labs/Other Studies Reviewed:   The following studies were personally reviewed by me today:  TTE 04/29/2020  1. Left ventricular ejection fraction, by estimation, is 65 to 70%. The  left ventricle has normal function. The left ventricle has no regional  wall motion abnormalities. Left ventricular diastolic parameters are  consistent with Grade I diastolic  dysfunction (impaired relaxation).   2. Right ventricular systolic function is normal. The right ventricular  size is normal. There is normal pulmonary artery systolic pressure. The  estimated right ventricular systolic pressure is 38.1 mmHg.   3. The mitral valve is grossly normal. No evidence of mitral valve  regurgitation.   4. The aortic  valve is tricuspid. Aortic valve regurgitation is not  visualized.   5. The inferior vena cava is normal in size with greater than 50%  respiratory variability, suggesting right atrial pressure of 3 mmHg.   Zio 05/02/2020 1. Second Degree AV Block Mobitz 1 was present but no high grade conduction disease was present.  2. Occasional PACs (4.4% burden).  3. Frequent PVCs (6.2%) burden.   NM Stress 07/18/2020 Lexiscan stress is electrically negative for ischemia Myovue scan  shows normal perfusion No ischemia LVEF is 70% Low risk study    Recent Labs: 06/08/2021: ALT 17; Hemoglobin 11.5; Platelets 277 06/09/2021: BUN 33; Creatinine, Ser 1.20; Magnesium 2.1; Potassium 3.4; Sodium 133   Recent Lipid Panel No results found for: CHOL, TRIG, HDL, CHOLHDL, VLDL, LDLCALC, LDLDIRECT  Physical Exam:   VS:  BP 124/80 (BP Location: Right Arm, Patient Position: Sitting, Cuff Size: Normal)    Pulse 97    Ht 5\' 1"  (1.549 m)    Wt 177 lb (80.3 kg)    BMI 33.44 kg/m    Wt Readings from Last 3 Encounters:  07/04/21 177 lb (80.3 kg)  06/08/21 174 lb (78.9 kg)  07/18/20 177 lb (80.3 kg)    General: Well nourished, well developed, in no acute distress Head: Atraumatic, normal size  Eyes: PEERLA, EOMI  Neck: Supple, no JVD Endocrine: No thryomegaly Cardiac: Normal S1, S2; RRR; no murmurs, rubs, or gallops Lungs: Clear to auscultation bilaterally, no wheezing, rhonchi or rales  Abd: Soft, nontender, no hepatomegaly  Ext: No edema, pulses 2+ Musculoskeletal: No deformities, BUE and BLE strength normal and equal Skin: Warm and dry, no rashes   Neuro: Alert and oriented to person, place, time, and situation, CNII-XII grossly intact, no focal deficits  Psych: Normal mood and affect   ASSESSMENT:   Michaela Santana is a 76 y.o. female who presents for the following: 1. First degree AV block   2. PAC (premature atrial contraction)   3. PVC (premature ventricular contraction)   4. Palpitations   5.  Sinus bradycardia   6. SOB (shortness of breath)     PLAN:   1. First degree AV block 2. PAC (premature atrial contraction) 3. PVC (premature ventricular contraction) 4. Palpitations 5. Sinus bradycardia -Evaluated in 2021 for shortness of breath palpitations.  Found to have PACs and PVCs.  Symptoms of palpitations have resolved.  She did have evidence of first-degree AV block over 350 ms.  No evidence of high-grade conduction disease on monitor.  She did have episodes of Wenke block.  She also had episodes of this on recent trip to the emergency room in the setting of hypomagnesemia.  Nothing appears to be new here.  Her echocardiogram was normal.  Nuclear medicine stress test was also normal in 2021.  Cardiovascular examination is unchanged.  I believe her shortness of breath is likely multifactorial in the setting of anemia as well as recent diarrheal illness.  Would recommend further work-up of this per primary care physician.  6. SOB (shortness of breath) -Suspect multifactorial in the setting of anemia as well as recent diarrheal illness.  She reports she is slowly feeling better.  Suspect this will take some time for improvement.  She is now on iron supplementation for her anemia.  Disposition: Return in about 1 year (around 07/04/2022).  Medication Adjustments/Labs and Tests Ordered: Current medicines are reviewed at length with the patient today.  Concerns regarding medicines are outlined above.  No orders of the defined types were placed in this encounter.  No orders of the defined types were placed in this encounter.   Patient Instructions  Medication Instructions:  The current medical regimen is effective;  continue present plan and medications.  *If you need a refill on your cardiac medications before your next appointment, please call your pharmacy*   Follow-Up: At [facility], you and your health needs are our priority.  As part of our continuing mission to provide  you with exceptional  heart care, we have created designated Provider Care Teams.  These Care Teams include your primary Cardiologist (physician) and Advanced Practice Providers (APPs -  Physician Assistants and Nurse Practitioners) who all work together to provide you with the care you need, when you need it.  We recommend signing up for the patient portal called "MyChart".  Sign up information is provided on this After Visit Summary.  MyChart is used to connect with patients for Virtual Visits (Telemedicine).  Patients are able to view lab/test results, encounter notes, upcoming appointments, etc.  Non-urgent messages can be sent to your provider as well.   To learn more about what you can do with MyChart, go to [email or web address].    Your next appointment:   12 month(s)  The format for your next appointment:   In Person  Provider:   Eleonore Chiquito, MD       Signed, Addison Naegeli. Audie Box, MD, [place]  [address], [place], [place] [address] [phone]  07/04/2021 4:49 PM

## 2021-07-04 ENCOUNTER — Ambulatory Visit (INDEPENDENT_AMBULATORY_CARE_PROVIDER_SITE_OTHER): Payer: Medicare Other | Admitting: Cardiovascular Disease

## 2021-07-04 ENCOUNTER — Encounter (HOSPITAL_BASED_OUTPATIENT_CLINIC_OR_DEPARTMENT_OTHER): Payer: Self-pay | Admitting: Cardiovascular Disease

## 2021-07-04 ENCOUNTER — Other Ambulatory Visit: Payer: Self-pay

## 2021-07-04 VITALS — BP 124/80 | HR 97 | Ht 61.0 in | Wt 177.0 lb

## 2021-07-04 DIAGNOSIS — R001 Bradycardia, unspecified: Secondary | ICD-10-CM

## 2021-07-04 DIAGNOSIS — I440 Atrioventricular block, first degree: Secondary | ICD-10-CM | POA: Diagnosis not present

## 2021-07-04 DIAGNOSIS — I493 Ventricular premature depolarization: Secondary | ICD-10-CM | POA: Diagnosis not present

## 2021-07-04 DIAGNOSIS — R0602 Shortness of breath: Secondary | ICD-10-CM | POA: Diagnosis not present

## 2021-07-04 DIAGNOSIS — I491 Atrial premature depolarization: Secondary | ICD-10-CM | POA: Diagnosis not present

## 2021-07-04 DIAGNOSIS — R002 Palpitations: Secondary | ICD-10-CM

## 2021-07-04 NOTE — Patient Instructions (Signed)

## 2021-07-05 ENCOUNTER — Encounter: Payer: Self-pay | Admitting: Pulmonary Disease

## 2021-07-05 ENCOUNTER — Ambulatory Visit (INDEPENDENT_AMBULATORY_CARE_PROVIDER_SITE_OTHER): Payer: Medicare Other | Admitting: Pulmonary Disease

## 2021-07-05 VITALS — BP 128/80 | HR 80 | Temp 97.7°F | Ht 61.0 in | Wt 175.4 lb

## 2021-07-05 DIAGNOSIS — R918 Other nonspecific abnormal finding of lung field: Secondary | ICD-10-CM

## 2021-07-05 DIAGNOSIS — R933 Abnormal findings on diagnostic imaging of other parts of digestive tract: Secondary | ICD-10-CM

## 2021-07-05 DIAGNOSIS — Z7722 Contact with and (suspected) exposure to environmental tobacco smoke (acute) (chronic): Secondary | ICD-10-CM

## 2021-07-05 NOTE — H&P (View-Only) (Signed)
Synopsis: Referred in January 2023 for multiple pulmonary nodules by [name], *  Subjective:   PATIENT ID: Michaela Santana GENDER: female DOB: Oct 28, 1945, MRN: [id number]  Chief Complaint  Patient presents with   Consult    Patient is here to talk about lung nodules    This is a 76 year old female, past medical history of hypertension, diabetes type 2, vitamin D deficiency, chronic kidney disease stage III, gastroesophageal reflux disease.She was referred today after having a CT abdomen pelvis completed on 06/09/2021 this revealed numerous bilateral pulmonary nodules worrisome for metastasis.  Patient had the largest nodule with in the left lower lung measuring 11 mm and the largest within the right measuring 6.6 mm.  There is also a moderate sized hiatal hernia and a questionable eccentric right sided rectal wall thickening recommended for evaluation.  She also has a 2 cm uterine fibroid no other obvious primary identified.  Patient is a lifelong non-smoker.  Does have secondhand smoke exposure from her parents growing up.  Up-to-date on colonoscopy which was done at the beginning of the month as well as mammograms every year.   Past Medical History:  Diagnosis Date   Anemia    Arthritis    "probably in hands" (07/17/2016)   CKD (chronic kidney disease), stage III (HCC)    Depression    Family history of adverse reaction to anesthesia    "her father had some issues when he had his gallbladder taken out when he was in his late 18s"   GERD (gastroesophageal reflux disease)    Glaucoma    open angle, bilateral   High cholesterol    History of kidney stones    Hypertension    Lumbago with sciatica, right side    Sepsis ([place]) 07/2016   d/t flu   Type II diabetes mellitus ([place])    Vitamin D deficiency      Family History  Problem Relation Age of Onset   Dementia Mother    Diabetes Mother    Stroke Father    Hypertension Father    Diabetes Sister    Hypertension  Sister    Heart disease Paternal Grandfather    Breast cancer Neg Hx      Past Surgical History:  Procedure Laterality Date   BREAST BIOPSY  1990   "? side; it wasn't anything"   CATARACT EXTRACTION, BILATERAL  2021   CHOLECYSTECTOMY OPEN  1971   TUBAL LIGATION  1978    Social History   Socioeconomic History   Marital status: Widowed    Spouse name: Not on file   Number of children: 2   Years of education: Not on file   Highest education level: Some college, no degree  Occupational History   Occupation: Environmental consultant  Tobacco Use   Smoking status: Never   Smokeless tobacco: Never  Substance and Sexual Activity   Alcohol use: Never   Drug use: Never   Sexual activity: Not on file  Other Topics Concern   Not on file  Social History Narrative   02/08/20 Lives alone   caffeine soda 1-2 daily   Social Determinants of Health   Financial Resource Strain: Not on file  Food Insecurity: Not on file  Transportation Needs: Not on file  Physical Activity: Not on file  Stress: Not on file  Social Connections: Not on file  Intimate Partner Violence: Not on file     Allergies  Allergen Reactions   Codeine Hypertension   Alprazolam  Other (See Comments)    lethargic     Outpatient Medications Prior to Visit  Medication Sig Dispense Refill   amLODipine (NORVASC) 5 MG tablet Take 5 mg by mouth daily.     atorvastatin (LIPITOR) 10 MG tablet Take 10 mg by mouth daily.     Blood Glucose Monitoring Suppl (FREESTYLE LITE) DEVI USE UTD  0   DULoxetine HCl 40 MG CPEP Take 1 capsule by mouth daily.     ferrous sulfate 325 (65 FE) MG EC tablet Take 325 mg by mouth daily.     FREESTYLE LITE test strip USE TO CHECK FASTING GLUCOSE IN THE MORNING AND 2 HOURS AFTER DINNER OR LUNCH  3   gabapentin (NEURONTIN) 300 MG capsule Take 300 mg by mouth daily.     glipiZIDE (GLUCOTROL XL) 5 MG 24 hr tablet Take 5 mg by mouth daily with breakfast.     Lancets (FREESTYLE) lancets      lansoprazole  (PREVACID) 30 MG capsule Take 30 mg by mouth daily at 12 noon.     losartan (COZAAR) 100 MG tablet Take 100 mg by mouth daily.     magnesium oxide (MAG-OX) 400 (240 Mg) MG tablet Take 2.5 tablets by mouth daily. Taking 2.5 tablets starting today 1,000 mg     metFORMIN (GLUCOPHAGE-XR) 500 MG 24 hr tablet Take 1 tablet in the morning and 2 tablets in the evening     potassium chloride (KLOR-CON) 10 MEQ tablet Take 1 tablet (10 mEq total) by mouth daily for 30 doses. 30 tablet 0   Vitamin D, Ergocalciferol, (DRISDOL) 50000 units CAPS capsule Take 50,000 Units by mouth every 7 (seven) days.     calcium carbonate (TUMS) 500 MG chewable tablet Chew 1 tablet (200 mg of elemental calcium total) by mouth 2 (two) times daily. 30 tablet 0   cetirizine (ZYRTEC) 10 MG chewable tablet Chew 10 mg by mouth daily.     econazole nitrate 1 % cream Apply 1 application topically daily.      Multiple Vitamin (MULTIVITAMIN) tablet Take 1 tablet by mouth daily.     No facility-administered medications prior to visit.    Review of Systems  Constitutional:  Negative for chills, fever, malaise/fatigue and weight loss.  HENT:  Negative for hearing loss, sore throat and tinnitus.   Eyes:  Negative for blurred vision and double vision.  Respiratory:  Negative for cough, hemoptysis, sputum production, shortness of breath, wheezing and stridor.   Cardiovascular:  Negative for chest pain, palpitations, orthopnea, leg swelling and PND.  Gastrointestinal:  Negative for abdominal pain, constipation, diarrhea, heartburn, nausea and vomiting.  Genitourinary:  Negative for dysuria, hematuria and urgency.  Musculoskeletal:  Negative for joint pain and myalgias.  Skin:  Negative for itching and rash.  Neurological:  Negative for dizziness, tingling, weakness and headaches.  Endo/Heme/Allergies:  Negative for environmental allergies. Does not bruise/bleed easily.  Psychiatric/Behavioral:  Negative for depression. The patient is not  nervous/anxious and does not have insomnia.   All other systems reviewed and are negative.   Objective:  Physical Exam Vitals reviewed.  Constitutional:      General: She is not in acute distress.    Appearance: She is well-developed. She is obese.  HENT:     Head: Normocephalic and atraumatic.  Eyes:     General: No scleral icterus.    Conjunctiva/sclera: Conjunctivae normal.     Pupils: Pupils are equal, round, and reactive to light.  Neck:  Vascular: No JVD.     Trachea: No tracheal deviation.  Cardiovascular:     Rate and Rhythm: Normal rate and regular rhythm.     Heart sounds: Normal heart sounds. No murmur heard. Pulmonary:     Effort: Pulmonary effort is normal. No tachypnea, accessory muscle usage or respiratory distress.     Breath sounds: No stridor. No wheezing, rhonchi or rales.  Abdominal:     General: There is no distension.     Palpations: Abdomen is soft.     Tenderness: There is no abdominal tenderness.  Musculoskeletal:        General: No tenderness.     Cervical back: Neck supple.  Lymphadenopathy:     Cervical: No cervical adenopathy.  Skin:    General: Skin is warm and dry.     Capillary Refill: Capillary refill takes less than 2 seconds.     Findings: No rash.  Neurological:     Mental Status: She is alert and oriented to person, place, and time.  Psychiatric:        Behavior: Behavior normal.     Vitals:   07/05/21 1037  BP: 128/80  Pulse: 80  Temp: 97.7 F (36.5 C)  TempSrc: Oral  SpO2: 95%  Weight: 175 lb 6.4 oz (79.6 kg)  Height: 5\' 1"  (1.549 m)   95% on RA BMI Readings from Last 3 Encounters:  07/05/21 33.14 kg/m  07/04/21 33.44 kg/m  06/08/21 32.88 kg/m   Wt Readings from Last 3 Encounters:  07/05/21 175 lb 6.4 oz (79.6 kg)  07/04/21 177 lb (80.3 kg)  06/08/21 174 lb (78.9 kg)     CBC    Component Value Date/Time   WBC 12.7 (H) 06/08/2021 2033   RBC 4.18 06/08/2021 2033   HGB 11.5 (L) 06/08/2021 2033   HCT  36.3 06/08/2021 2033   PLT 277 06/08/2021 2033   MCV 86.8 06/08/2021 2033   MCH 27.5 06/08/2021 2033   MCHC 31.7 06/08/2021 2033   RDW 17.1 (H) 06/08/2021 2033   LYMPHSABS 0.5 (L) 07/17/2016 1234   MONOABS 0.7 07/17/2016 1234   EOSABS 0.0 07/17/2016 1234   BASOSABS 0.0 07/17/2016 1234     Chest Imaging: CT chest 06/09/2021: CT abdomen and pelvis completed an emergency department visit.  Showed innumerable lower lobe pulmonary nodules concerning for metastasis. The patient's images have been independently reviewed by me.    Pulmonary Functions Testing Results: No flowsheet data found.  FeNO:   Pathology:   Echocardiogram:   Lexi scan 07/18/2020: Lexiscan stress is electrically negative for ischemia Myovue scan shows normal perfusion No ischemia LVEF is 70% Low risk study    Heart Catheterization:     Assessment & Plan:     ICD-10-CM   1. Multiple pulmonary nodules  R91.8 NM PET Image Initial (PI) Skull Base To Thigh (F-18 FDG)    2. Abnormal CT scan, colon  R93.3     3. Second hand smoke exposure  Z77.22       Discussion:  76 year old female here today for follow-up after recent CT abdomen and pelvis with multiple pulmonary nodules, largest within the left lower lobe 11 mm, non-smoker, up-to-date on mammograms, up-to-date on colon cancer screening.  No previous malignancies in the past does have secondhand smoke exposure from parents.  I did explain the lesion is rounded smooth and lesions above 1 cm in size do need further evaluation.  We could be dealing with a low-grade malignancy such as carcinoid.  Plan: Due to size of lesion I think next step would be pet scan. I have ordered this to be completed. Patient to follow-up with [place] in approximately 3 to 4 weeks, me or Eric Form, NP to discuss PET scan results.  And discuss next best steps. Pending upon the PET scan may need to consider biopsy of the lesion or short-term follow-up.     Current Outpatient  Medications:    amLODipine (NORVASC) 5 MG tablet, Take 5 mg by mouth daily., Disp: , Rfl:    atorvastatin (LIPITOR) 10 MG tablet, Take 10 mg by mouth daily., Disp: , Rfl:    Blood Glucose Monitoring Suppl (FREESTYLE LITE) DEVI, USE UTD, Disp: , Rfl: 0   DULoxetine HCl 40 MG CPEP, Take 1 capsule by mouth daily., Disp: , Rfl:    ferrous sulfate 325 (65 FE) MG EC tablet, Take 325 mg by mouth daily., Disp: , Rfl:    FREESTYLE LITE test strip, USE TO CHECK FASTING GLUCOSE IN THE MORNING AND 2 HOURS AFTER DINNER OR LUNCH, Disp: , Rfl: 3   gabapentin (NEURONTIN) 300 MG capsule, Take 300 mg by mouth daily., Disp: , Rfl:    glipiZIDE (GLUCOTROL XL) 5 MG 24 hr tablet, Take 5 mg by mouth daily with breakfast., Disp: , Rfl:    Lancets (FREESTYLE) lancets, , Disp: , Rfl:    lansoprazole (PREVACID) 30 MG capsule, Take 30 mg by mouth daily at 12 noon., Disp: , Rfl:    losartan (COZAAR) 100 MG tablet, Take 100 mg by mouth daily., Disp: , Rfl:    magnesium oxide (MAG-OX) 400 (240 Mg) MG tablet, Take 2.5 tablets by mouth daily. Taking 2.5 tablets starting today 1,000 mg, Disp: , Rfl:    metFORMIN (GLUCOPHAGE-XR) 500 MG 24 hr tablet, Take 1 tablet in the morning and 2 tablets in the evening, Disp: , Rfl:    potassium chloride (KLOR-CON) 10 MEQ tablet, Take 1 tablet (10 mEq total) by mouth daily for 30 doses., Disp: 30 tablet, Rfl: 0   Vitamin D, Ergocalciferol, (DRISDOL) 50000 units CAPS capsule, Take 50,000 Units by mouth every 7 (seven) days., Disp: , Rfl:    calcium carbonate (TUMS) 500 MG chewable tablet, Chew 1 tablet (200 mg of elemental calcium total) by mouth 2 (two) times daily., Disp: 30 tablet, Rfl: 0   cetirizine (ZYRTEC) 10 MG chewable tablet, Chew 10 mg by mouth daily., Disp: , Rfl:    econazole nitrate 1 % cream, Apply 1 application topically daily. , Disp: , Rfl:    Multiple Vitamin (MULTIVITAMIN) tablet, Take 1 tablet by mouth daily., Disp: , Rfl:   I spent 63 minutes dedicated to the care of this  patient on the date of this encounter to include pre-visit review of records, face-to-face time with the patient discussing conditions above, post visit ordering of testing, clinical documentation with the electronic health record, making appropriate referrals as documented, and communicating necessary findings to members of the patients care team.   Garner Nash, DO [facility] 07/05/2021 10:43 AM

## 2021-07-05 NOTE — Patient Instructions (Signed)
Thank you for visiting Dr. Valeta Harms at [facility]. Today we recommend the following:  Orders Placed This Encounter  Procedures   NM PET Image Initial (PI) Skull Base To Thigh (F-18 FDG)   See [place] after your PET scan to discuss next steps.   Return in about 4 weeks (around 08/02/2021) for with Eric Form, NP, or Dr. Valeta Harms.    Please do your part to reduce the spread of COVID-19.

## 2021-07-05 NOTE — Progress Notes (Signed)
Synopsis: Referred in January 2023 for multiple pulmonary nodules by [name], *  Subjective:   PATIENT ID: Michaela Santana GENDER: female DOB: 01-29-1946, MRN: [id number]  Chief Complaint  Patient presents with   Consult    Patient is here to talk about lung nodules    This is a 76 year old female, past medical history of hypertension, diabetes type 2, vitamin D deficiency, chronic kidney disease stage III, gastroesophageal reflux disease.She was referred today after having a CT abdomen pelvis completed on 06/09/2021 this revealed numerous bilateral pulmonary nodules worrisome for metastasis.  Patient had the largest nodule with in the left lower lung measuring 11 mm and the largest within the right measuring 6.6 mm.  There is also a moderate sized hiatal hernia and a questionable eccentric right sided rectal wall thickening recommended for evaluation.  She also has a 2 cm uterine fibroid no other obvious primary identified.  Patient is a lifelong non-smoker.  Does have secondhand smoke exposure from her parents growing up.  Up-to-date on colonoscopy which was done at the beginning of the month as well as mammograms every year.   Past Medical History:  Diagnosis Date   Anemia    Arthritis    "probably in hands" (07/17/2016)   CKD (chronic kidney disease), stage III (HCC)    Depression    Family history of adverse reaction to anesthesia    "her father had some issues when he had his gallbladder taken out when he was in his late 62s"   GERD (gastroesophageal reflux disease)    Glaucoma    open angle, bilateral   High cholesterol    History of kidney stones    Hypertension    Lumbago with sciatica, right side    Sepsis ([place]) 07/2016   d/t flu   Type II diabetes mellitus ([place])    Vitamin D deficiency      Family History  Problem Relation Age of Onset   Dementia Mother    Diabetes Mother    Stroke Father    Hypertension Father    Diabetes Sister    Hypertension  Sister    Heart disease Paternal Grandfather    Breast cancer Neg Hx      Past Surgical History:  Procedure Laterality Date   BREAST BIOPSY  1990   "? side; it wasn't anything"   CATARACT EXTRACTION, BILATERAL  2021   CHOLECYSTECTOMY OPEN  1971   TUBAL LIGATION  1978    Social History   Socioeconomic History   Marital status: Widowed    Spouse name: Not on file   Number of children: 2   Years of education: Not on file   Highest education level: Some college, no degree  Occupational History   Occupation: Environmental consultant  Tobacco Use   Smoking status: Never   Smokeless tobacco: Never  Substance and Sexual Activity   Alcohol use: Never   Drug use: Never   Sexual activity: Not on file  Other Topics Concern   Not on file  Social History Narrative   02/08/20 Lives alone   caffeine soda 1-2 daily   Social Determinants of Health   Financial Resource Strain: Not on file  Food Insecurity: Not on file  Transportation Needs: Not on file  Physical Activity: Not on file  Stress: Not on file  Social Connections: Not on file  Intimate Partner Violence: Not on file     Allergies  Allergen Reactions   Codeine Hypertension   Alprazolam  Other (See Comments)    lethargic     Outpatient Medications Prior to Visit  Medication Sig Dispense Refill   amLODipine (NORVASC) 5 MG tablet Take 5 mg by mouth daily.     atorvastatin (LIPITOR) 10 MG tablet Take 10 mg by mouth daily.     Blood Glucose Monitoring Suppl (FREESTYLE LITE) DEVI USE UTD  0   DULoxetine HCl 40 MG CPEP Take 1 capsule by mouth daily.     ferrous sulfate 325 (65 FE) MG EC tablet Take 325 mg by mouth daily.     FREESTYLE LITE test strip USE TO CHECK FASTING GLUCOSE IN THE MORNING AND 2 HOURS AFTER DINNER OR LUNCH  3   gabapentin (NEURONTIN) 300 MG capsule Take 300 mg by mouth daily.     glipiZIDE (GLUCOTROL XL) 5 MG 24 hr tablet Take 5 mg by mouth daily with breakfast.     Lancets (FREESTYLE) lancets      lansoprazole  (PREVACID) 30 MG capsule Take 30 mg by mouth daily at 12 noon.     losartan (COZAAR) 100 MG tablet Take 100 mg by mouth daily.     magnesium oxide (MAG-OX) 400 (240 Mg) MG tablet Take 2.5 tablets by mouth daily. Taking 2.5 tablets starting today 1,000 mg     metFORMIN (GLUCOPHAGE-XR) 500 MG 24 hr tablet Take 1 tablet in the morning and 2 tablets in the evening     potassium chloride (KLOR-CON) 10 MEQ tablet Take 1 tablet (10 mEq total) by mouth daily for 30 doses. 30 tablet 0   Vitamin D, Ergocalciferol, (DRISDOL) 50000 units CAPS capsule Take 50,000 Units by mouth every 7 (seven) days.     calcium carbonate (TUMS) 500 MG chewable tablet Chew 1 tablet (200 mg of elemental calcium total) by mouth 2 (two) times daily. 30 tablet 0   cetirizine (ZYRTEC) 10 MG chewable tablet Chew 10 mg by mouth daily.     econazole nitrate 1 % cream Apply 1 application topically daily.      Multiple Vitamin (MULTIVITAMIN) tablet Take 1 tablet by mouth daily.     No facility-administered medications prior to visit.    Review of Systems  Constitutional:  Negative for chills, fever, malaise/fatigue and weight loss.  HENT:  Negative for hearing loss, sore throat and tinnitus.   Eyes:  Negative for blurred vision and double vision.  Respiratory:  Negative for cough, hemoptysis, sputum production, shortness of breath, wheezing and stridor.   Cardiovascular:  Negative for chest pain, palpitations, orthopnea, leg swelling and PND.  Gastrointestinal:  Negative for abdominal pain, constipation, diarrhea, heartburn, nausea and vomiting.  Genitourinary:  Negative for dysuria, hematuria and urgency.  Musculoskeletal:  Negative for joint pain and myalgias.  Skin:  Negative for itching and rash.  Neurological:  Negative for dizziness, tingling, weakness and headaches.  Endo/Heme/Allergies:  Negative for environmental allergies. Does not bruise/bleed easily.  Psychiatric/Behavioral:  Negative for depression. The patient is not  nervous/anxious and does not have insomnia.   All other systems reviewed and are negative.   Objective:  Physical Exam Vitals reviewed.  Constitutional:      General: She is not in acute distress.    Appearance: She is well-developed. She is obese.  HENT:     Head: Normocephalic and atraumatic.  Eyes:     General: No scleral icterus.    Conjunctiva/sclera: Conjunctivae normal.     Pupils: Pupils are equal, round, and reactive to light.  Neck:  Vascular: No JVD.     Trachea: No tracheal deviation.  Cardiovascular:     Rate and Rhythm: Normal rate and regular rhythm.     Heart sounds: Normal heart sounds. No murmur heard. Pulmonary:     Effort: Pulmonary effort is normal. No tachypnea, accessory muscle usage or respiratory distress.     Breath sounds: No stridor. No wheezing, rhonchi or rales.  Abdominal:     General: There is no distension.     Palpations: Abdomen is soft.     Tenderness: There is no abdominal tenderness.  Musculoskeletal:        General: No tenderness.     Cervical back: Neck supple.  Lymphadenopathy:     Cervical: No cervical adenopathy.  Skin:    General: Skin is warm and dry.     Capillary Refill: Capillary refill takes less than 2 seconds.     Findings: No rash.  Neurological:     Mental Status: She is alert and oriented to person, place, and time.  Psychiatric:        Behavior: Behavior normal.     Vitals:   07/05/21 1037  BP: 128/80  Pulse: 80  Temp: 97.7 F (36.5 C)  TempSrc: Oral  SpO2: 95%  Weight: 175 lb 6.4 oz (79.6 kg)  Height: 5\' 1"  (1.549 m)   95% on RA BMI Readings from Last 3 Encounters:  07/05/21 33.14 kg/m  07/04/21 33.44 kg/m  06/08/21 32.88 kg/m   Wt Readings from Last 3 Encounters:  07/05/21 175 lb 6.4 oz (79.6 kg)  07/04/21 177 lb (80.3 kg)  06/08/21 174 lb (78.9 kg)     CBC    Component Value Date/Time   WBC 12.7 (H) 06/08/2021 2033   RBC 4.18 06/08/2021 2033   HGB 11.5 (L) 06/08/2021 2033   HCT  36.3 06/08/2021 2033   PLT 277 06/08/2021 2033   MCV 86.8 06/08/2021 2033   MCH 27.5 06/08/2021 2033   MCHC 31.7 06/08/2021 2033   RDW 17.1 (H) 06/08/2021 2033   LYMPHSABS 0.5 (L) 07/17/2016 1234   MONOABS 0.7 07/17/2016 1234   EOSABS 0.0 07/17/2016 1234   BASOSABS 0.0 07/17/2016 1234     Chest Imaging: CT chest 06/09/2021: CT abdomen and pelvis completed an emergency department visit.  Showed innumerable lower lobe pulmonary nodules concerning for metastasis. The patient's images have been independently reviewed by me.    Pulmonary Functions Testing Results: No flowsheet data found.  FeNO:   Pathology:   Echocardiogram:   Lexi scan 07/18/2020: Lexiscan stress is electrically negative for ischemia Myovue scan shows normal perfusion No ischemia LVEF is 70% Low risk study    Heart Catheterization:     Assessment & Plan:     ICD-10-CM   1. Multiple pulmonary nodules  R91.8 NM PET Image Initial (PI) Skull Base To Thigh (F-18 FDG)    2. Abnormal CT scan, colon  R93.3     3. Second hand smoke exposure  Z77.22       Discussion:  76 year old female here today for follow-up after recent CT abdomen and pelvis with multiple pulmonary nodules, largest within the left lower lobe 11 mm, non-smoker, up-to-date on mammograms, up-to-date on colon cancer screening.  No previous malignancies in the past does have secondhand smoke exposure from parents.  I did explain the lesion is rounded smooth and lesions above 1 cm in size do need further evaluation.  We could be dealing with a low-grade malignancy such as carcinoid.  Plan: Due to size of lesion I think next step would be pet scan. I have ordered this to be completed. Patient to follow-up with [place] in approximately 3 to 4 weeks, me or Eric Form, NP to discuss PET scan results.  And discuss next best steps. Pending upon the PET scan may need to consider biopsy of the lesion or short-term follow-up.     Current Outpatient  Medications:    amLODipine (NORVASC) 5 MG tablet, Take 5 mg by mouth daily., Disp: , Rfl:    atorvastatin (LIPITOR) 10 MG tablet, Take 10 mg by mouth daily., Disp: , Rfl:    Blood Glucose Monitoring Suppl (FREESTYLE LITE) DEVI, USE UTD, Disp: , Rfl: 0   DULoxetine HCl 40 MG CPEP, Take 1 capsule by mouth daily., Disp: , Rfl:    ferrous sulfate 325 (65 FE) MG EC tablet, Take 325 mg by mouth daily., Disp: , Rfl:    FREESTYLE LITE test strip, USE TO CHECK FASTING GLUCOSE IN THE MORNING AND 2 HOURS AFTER DINNER OR LUNCH, Disp: , Rfl: 3   gabapentin (NEURONTIN) 300 MG capsule, Take 300 mg by mouth daily., Disp: , Rfl:    glipiZIDE (GLUCOTROL XL) 5 MG 24 hr tablet, Take 5 mg by mouth daily with breakfast., Disp: , Rfl:    Lancets (FREESTYLE) lancets, , Disp: , Rfl:    lansoprazole (PREVACID) 30 MG capsule, Take 30 mg by mouth daily at 12 noon., Disp: , Rfl:    losartan (COZAAR) 100 MG tablet, Take 100 mg by mouth daily., Disp: , Rfl:    magnesium oxide (MAG-OX) 400 (240 Mg) MG tablet, Take 2.5 tablets by mouth daily. Taking 2.5 tablets starting today 1,000 mg, Disp: , Rfl:    metFORMIN (GLUCOPHAGE-XR) 500 MG 24 hr tablet, Take 1 tablet in the morning and 2 tablets in the evening, Disp: , Rfl:    potassium chloride (KLOR-CON) 10 MEQ tablet, Take 1 tablet (10 mEq total) by mouth daily for 30 doses., Disp: 30 tablet, Rfl: 0   Vitamin D, Ergocalciferol, (DRISDOL) 50000 units CAPS capsule, Take 50,000 Units by mouth every 7 (seven) days., Disp: , Rfl:    calcium carbonate (TUMS) 500 MG chewable tablet, Chew 1 tablet (200 mg of elemental calcium total) by mouth 2 (two) times daily., Disp: 30 tablet, Rfl: 0   cetirizine (ZYRTEC) 10 MG chewable tablet, Chew 10 mg by mouth daily., Disp: , Rfl:    econazole nitrate 1 % cream, Apply 1 application topically daily. , Disp: , Rfl:    Multiple Vitamin (MULTIVITAMIN) tablet, Take 1 tablet by mouth daily., Disp: , Rfl:   I spent 63 minutes dedicated to the care of this  patient on the date of this encounter to include pre-visit review of records, face-to-face time with the patient discussing conditions above, post visit ordering of testing, clinical documentation with the electronic health record, making appropriate referrals as documented, and communicating necessary findings to members of the patients care team.   Garner Nash, DO [facility] 07/05/2021 10:43 AM

## 2021-07-14 ENCOUNTER — Ambulatory Visit (HOSPITAL_COMMUNITY)
Admission: RE | Admit: 2021-07-14 | Discharge: 2021-07-14 | Disposition: A | Discharge status: Home / Self Care | Payer: Medicare Other | Source: Ambulatory Visit | Attending: Pulmonary Disease | Admitting: Pulmonary Disease

## 2021-07-14 ENCOUNTER — Other Ambulatory Visit: Payer: Self-pay

## 2021-07-14 DIAGNOSIS — R918 Other nonspecific abnormal finding of lung field: Secondary | ICD-10-CM | POA: Diagnosis not present

## 2021-07-14 LAB — GLUCOSE, CAPILLARY: Glucose-Capillary: 177 mg/dL — ABNORMAL HIGH (ref 70–99)

## 2021-07-14 IMAGING — CT NM PET TUM IMG INITIAL (PI) SKULL BASE T - THIGH
1 of 8 series · 1 of 25 positions shown · non-contrast
Comparison: [DATE].

CLINICAL DATA: Initial treatment strategy for numerous pulmonary
nodules found to be concerning for pulmonary metastatic disease on a
prior abdominal and pelvic CT.

EXAM:
NUCLEAR MEDICINE PET SKULL BASE TO THIGH
TECHNIQUE: 8.7 mCi F-18 FDG was injected intravenously. Full-ring PET imaging
was performed from the skull base to thigh after the radiotracer. CT
data was obtained and used for attenuation correction and anatomic
localization.
Fasting blood glucose: 77 mg/dl

[Series 4: ct sk_thigh 5.0 bf37 · axial · 5.0mm · 0.98mm/px · 1 of 216 slices shown]
[im 216/216  brain]
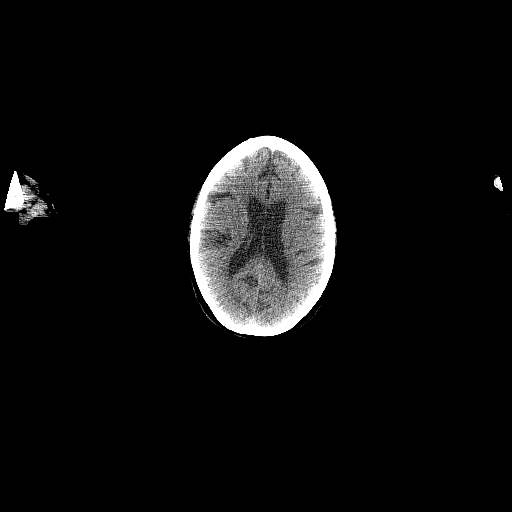

[1 of 25 positions shown; findings below may reference images not displayed]

FINDINGS: Mediastinal blood pool activity: SUV max

Liver activity: SUV max NA

NECK: No hypermetabolic lymph nodes in the neck.

Incidental CT findings: none

CHEST: No signs of hypermetabolic nodal tissue in the chest.

Diffuse randomly distributed innumerable pulmonary nodules seen in
the RIGHT and LEFT chest with basilar predominance. Patchy
ground-glass with mosaic pattern.

The dominant nodule in the LEFT upper lobe (image 64/4) maximum SUV
of 3.3 measuring 12 mm.

LEFT lower lobe pulmonary nodule (image 92/4) 10 mm just above the
LEFT hemidiaphragm maximum measured SUV in this area 1.7.

Multiple nodules less than 1 cm, some in the range of 2-3 mm
scattered randomly throughout the remainder of the chest again with
mid and lower chest predominance. None with marked increased
metabolic activity though size does limit assessment on the PET
portion of the evaluation particularly with respect to basilar
nodules.

Nodules that were seen previously on the CT of [DATE]
show no substantial change accounting for motion that is seen on
both studies at the lung bases.

Incidental CT findings: Calcified atheromatous plaque in the
thoracic aorta. The mild coronary artery calcifications. Mild
cardiomegaly without pericardial effusion. Normal caliber central
pulmonary vessels. Limited assessment of cardiovascular structures
given lack of intravenous contrast.

ABDOMEN/PELVIS: No abnormal hypermetabolic activity within the
liver, pancreas, adrenal glands, or spleen. No hypermetabolic lymph
nodes in the abdomen or pelvis.

Incidental CT findings: No acute findings relative to liver,
pancreas, spleen, adrenal glands or kidneys. Signs of mild renal
cortical scarring bilaterally. No hydronephrosis. No perivesical
stranding.

Large hiatal hernia with approximately 50% of the stomach herniated
into the chest. No acute gastrointestinal process. Colonic
diverticulosis. Aortic atherosclerosis without aneurysm.

SKELETON: No focal hypermetabolic activity to suggest skeletal
metastasis.

Incidental CT findings: Spinal degenerative changes.
IMPRESSION: Innumerable nodules almost all less than 1 cm and nearly all without
substantial FDG uptake but small size limiting assessment. Dominant
nodule in the LEFT upper lobe with mild to moderate uptake, pattern
remains highly concerning for metastatic process.

No signs of adenopathy in the chest. No signs of hypermetabolic
disease elsewhere in the neck, chest, abdomen or in the pelvis.

Incidental findings of large hiatal hernia, aortic atherosclerosis
and spinal degenerative changes.

## 2021-07-14 MED ORDER — FLUDEOXYGLUCOSE F - 18 (FDG) INJECTION
8.7000 | Freq: Once | INTRAVENOUS | Status: AC
  Administered 2021-07-14: 8.7 via INTRAVENOUS

## 2021-07-18 ENCOUNTER — Telehealth: Payer: Self-pay

## 2021-07-18 ENCOUNTER — Ambulatory Visit: Payer: Medicare Other | Admitting: Cardiovascular Disease

## 2021-07-18 ENCOUNTER — Other Ambulatory Visit: Payer: Self-pay | Admitting: Acute Care

## 2021-07-18 ENCOUNTER — Other Ambulatory Visit: Payer: Self-pay

## 2021-07-18 ENCOUNTER — Telehealth: Payer: Self-pay | Admitting: Acute Care

## 2021-07-18 ENCOUNTER — Ambulatory Visit (INDEPENDENT_AMBULATORY_CARE_PROVIDER_SITE_OTHER): Payer: Medicare Other | Admitting: Acute Care

## 2021-07-18 ENCOUNTER — Encounter: Payer: Self-pay | Admitting: Acute Care

## 2021-07-18 VITALS — BP 126/70 | HR 94 | Temp 98.2°F | Ht 61.0 in | Wt 176.8 lb

## 2021-07-18 DIAGNOSIS — R918 Other nonspecific abnormal finding of lung field: Secondary | ICD-10-CM | POA: Diagnosis not present

## 2021-07-18 DIAGNOSIS — R0609 Other forms of dyspnea: Secondary | ICD-10-CM | POA: Diagnosis not present

## 2021-07-18 DIAGNOSIS — Z6833 Body mass index (BMI) 33.0-33.9, adult: Secondary | ICD-10-CM | POA: Diagnosis not present

## 2021-07-18 NOTE — Progress Notes (Signed)
History of Present Illness Michaela Santana is a 76 y.o. 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 She had a PET scan 07/16/2021 to better evaluate the nodules of concern. She is followed by Dr. Valeta Harms.   Synopsis: Pt. Referred to Dr. Valeta Harms for Innumerable lung base nodules worrisome for metastases noted on a CT Abdomen and pelvis done 06/09/2021. Dr. Valeta Harms ordered a PET scan to better evaluate the nodules. Pt. Is here to review results of her PET scan.   07/18/2021 Pt. Presents for follow up. She is here with her daughter. She and her daughter had reviewed the PET results on My Chart prior to coming to the office. We discussed that we were surprised that there was a LUL nodule, not one of the original nodules of concern, that  mild to moderate uptake, pattern remains highly concerning for metastatic process. We discussed that this needs to be biopsied to better evaluate what it is. She and her daughter are in agreement with this plan. We diiscussed that DR. Icard can schedule her for a biopsy 08/01/2021 . We reviewed that there is risk of infection, bleeding and pneumothorax, all of which are low risk, and can be managed by Dr. Valeta Harms if they were to occur. She is not on blood thinners. She does not take aspirin . She does have anemia, and she takes iron supplementation. We discussed that this will be done under general anesthesia, and that usually patients have a sore throat and about a days work of blood tinged sputum after the procedure, which clears on its own. .  She does endorse some dyspnea on exertion. She has had this for about 1.5 years. She thinks it may be related to weight gain and decreased physical conditioning since her husband died. She does have some wheezing at times. She has never had PFT's.She has never used inhalers.  I told her we will address both the dyspnea and the physical  deconditioning after we get the lung nodule biopsy.  She did note she has some trace ankle edema after standing that resolves with putting her feet up.    Test Results: PET scan 07/16/2021 Innumerable nodules almost all less than 1 cm and nearly all without substantial FDG uptake but small size limiting assessment. Dominant nodule in the LEFT upper lobe with mild to moderate uptake, pattern remains highly concerning for metastatic process.  CT Abdomen and Pelvis 06/09/2021 1. Gastroenteritis without evidence of bowel obstruction or inflammation. 2. Moderate-sized hiatal hernia.  Small umbilical fat hernia. 3. Innumerable lung base nodules worrisome for metastases. 4. Questionable eccentric right-sided rectal wall thickening. Possible this could be adherent stool or a focal contraction. The study could be repeated with enteric contrast to see if this persists, barium enema could be obtained or colonoscopy. 5. Subcentimeter too small to characterize hypodensities in both kidneys with mild cortical thinning. 6. Slightly steatotic liver. 7. 2 cm fibroid in the left body of uterus. 8. Aortic atherosclerosis.  Mild cardiomegaly.  CBC Latest Ref Rng & Units 06/08/2021 07/18/2016 07/17/2016  WBC 4.0 - 10.5 K/uL 12.7(H) 5.1 6.3  Hemoglobin 12.0 - 15.0 g/dL 11.5(L) 12.6 14.9  Hematocrit 36.0 - 46.0 % 36.3 36.8 43.1  Platelets 150 - 400 K/uL 277 206 253    BMP Latest Ref Rng & Units 06/09/2021 06/08/2021 07/21/2016  Glucose 70 - 99 mg/dL 208(H) 202(H) 115(H)  BUN 8 - 23  mg/dL 33(H) 36(H) 33(H)  Creatinine 0.44 - 1.00 mg/dL 1.20(H) 1.21(H) 1.97(H)  Sodium 135 - 145 mmol/L 133(L) 134(L) 133(L)  Potassium 3.5 - 5.1 mmol/L 3.4(L) 3.5 3.5  Chloride 98 - 111 mmol/L 99 97(L) 105  CO2 22 - 32 mmol/L 23 23 18(L)  Calcium 8.9 - 10.3 mg/dL 7.6(L) 8.6(L) 6.5(L)    BNP    Component Value Date/Time   BNP 38.3 04/07/2020 0932   BNP 29.3 07/17/2016 2110    ProBNP No results found for:  PROBNP  PFT No results found for: FEV1PRE, FEV1POST, FVCPRE, FVCPOST, TLC, DLCOUNC, PREFEV1FVCRT, PSTFEV1FVCRT  NM PET Image Initial (PI) Skull Base To Thigh (F-18 FDG)  Result Date: 07/16/2021 CLINICAL DATA:  Initial treatment strategy for numerous pulmonary nodules found to be concerning for pulmonary metastatic disease on a prior abdominal and pelvic CT. EXAM: NUCLEAR MEDICINE PET SKULL BASE TO THIGH TECHNIQUE: 8.7 mCi F-18 FDG was injected intravenously. Full-ring PET imaging was performed from the skull base to thigh after the radiotracer. CT data was obtained and used for attenuation correction and anatomic localization. Fasting blood glucose: 77 mg/dl COMPARISON:  June 09, 2021. FINDINGS: Mediastinal blood pool activity: SUV max 2.50 Liver activity: SUV max NA NECK: No hypermetabolic lymph nodes in the neck. Incidental CT findings: none CHEST: No signs of hypermetabolic nodal tissue in the chest. Diffuse randomly distributed innumerable pulmonary nodules seen in the RIGHT and LEFT chest with basilar predominance. Patchy ground-glass with mosaic pattern. The dominant nodule in the LEFT upper lobe (image 64/4) maximum SUV of 3.3 measuring 12 mm. LEFT lower lobe pulmonary nodule (image 92/4) 10 mm just above the LEFT hemidiaphragm maximum measured SUV in this area 1.7. Multiple nodules less than 1 cm, some in the range of 2-3 mm scattered randomly throughout the remainder of the chest again with mid and lower chest predominance. None with marked increased metabolic activity though size does limit assessment on the PET portion of the evaluation particularly with respect to basilar nodules. Nodules that were seen previously on the CT of June 09, 2021 show no substantial change accounting for motion that is seen on both studies at the lung bases. Incidental CT findings: Calcified atheromatous plaque in the thoracic aorta. The mild coronary artery calcifications. Mild cardiomegaly without pericardial  effusion. Normal caliber central pulmonary vessels. Limited assessment of cardiovascular structures given lack of intravenous contrast. ABDOMEN/PELVIS: No abnormal hypermetabolic activity within the liver, pancreas, adrenal glands, or spleen. No hypermetabolic lymph nodes in the abdomen or pelvis. Incidental CT findings: No acute findings relative to liver, pancreas, spleen, adrenal glands or kidneys. Signs of mild renal cortical scarring bilaterally. No hydronephrosis. No perivesical stranding. Large hiatal hernia with approximately 50% of the stomach herniated into the chest. No acute gastrointestinal process. Colonic diverticulosis. Aortic atherosclerosis without aneurysm. SKELETON: No focal hypermetabolic activity to suggest skeletal metastasis. Incidental CT findings: Spinal degenerative changes. IMPRESSION: Innumerable nodules almost all less than 1 cm and nearly all without substantial FDG uptake but small size limiting assessment. Dominant nodule in the LEFT upper lobe with mild to moderate uptake, pattern remains highly concerning for metastatic process. No signs of adenopathy in the chest. No signs of hypermetabolic disease elsewhere in the neck, chest, abdomen or in the pelvis. Incidental findings of large hiatal hernia, aortic atherosclerosis and spinal degenerative changes. Electronically Signed   By: Zetta Bills M.D.   On: 07/16/2021 17:33     Past medical hx Past Medical History:  Diagnosis Date   Anemia  Arthritis    "probably in hands" (07/17/2016)   CKD (chronic kidney disease), stage III (HCC)    Depression    Family history of adverse reaction to anesthesia    "her father had some issues when he had his gallbladder taken out when he was in his late 77s"   GERD (gastroesophageal reflux disease)    Glaucoma    open angle, bilateral   High cholesterol    History of kidney stones    Hypertension    Lumbago with sciatica, right side    Sepsis ([place]) 07/2016   d/t flu   Type II  diabetes mellitus ([place])    Vitamin D deficiency      Social History   Tobacco Use   Smoking status: Never   Smokeless tobacco: Never  Substance Use Topics   Alcohol use: Never   Drug use: Never    Michaela Santana reports that she has never smoked. She has never used smokeless tobacco. She reports that she does not drink alcohol and does not use drugs.  Tobacco Cessation: Never smoker   Past surgical hx, Family hx, Social hx all reviewed.  Current Outpatient Medications on File Prior to Visit  Medication Sig   amLODipine (NORVASC) 5 MG tablet Take 5 mg by mouth daily.   atorvastatin (LIPITOR) 10 MG tablet Take 10 mg by mouth daily.   Blood Glucose Monitoring Suppl (FREESTYLE LITE) DEVI USE UTD   DULoxetine HCl 40 MG CPEP Take 1 capsule by mouth daily.   ferrous sulfate 325 (65 FE) MG EC tablet Take 325 mg by mouth daily.   FREESTYLE LITE test strip USE TO CHECK FASTING GLUCOSE IN THE MORNING AND 2 HOURS AFTER DINNER OR LUNCH   gabapentin (NEURONTIN) 300 MG capsule Take 300 mg by mouth daily.   glipiZIDE (GLUCOTROL XL) 5 MG 24 hr tablet Take 5 mg by mouth daily with breakfast.   Lancets (FREESTYLE) lancets    lansoprazole (PREVACID) 30 MG capsule Take 30 mg by mouth daily at 12 noon.   losartan (COZAAR) 100 MG tablet Take 100 mg by mouth daily.   magnesium oxide (MAG-OX) 400 (240 Mg) MG tablet Take 2.5 tablets by mouth daily. Taking 2.5 tablets starting today 1,000 mg   metFORMIN (GLUCOPHAGE-XR) 500 MG 24 hr tablet Take 1 tablet in the morning and 2 tablets in the evening   Vitamin D, Ergocalciferol, (DRISDOL) 50000 units CAPS capsule Take 50,000 Units by mouth every 7 (seven) days.   potassium chloride (KLOR-CON) 10 MEQ tablet Take 1 tablet (10 mEq total) by mouth daily for 30 doses.   No current facility-administered medications on file prior to visit.     Allergies  Allergen Reactions   Codeine Hypertension   Alprazolam Other (See Comments)    lethargic    Review Of  Systems:  Constitutional:   No  weight loss, night sweats,  Fevers, chills, fatigue, or  lassitude.  HEENT:   No headaches,  Difficulty swallowing,  Tooth/dental problems, or  Sore throat,                No sneezing, itching, ear ache, nasal congestion, post nasal drip,   CV:  No chest pain,  Orthopnea, PND, swelling in lower extremities, anasarca, dizziness, palpitations, syncope.   GI  No heartburn, indigestion, abdominal pain, nausea, vomiting, diarrhea, change in bowel habits, loss of appetite, bloody stools.   Resp: + shortness of breath with exertion less at rest.  No excess mucus, no productive cough,  No non-productive cough,  No coughing up of blood.  No change in color of mucus.  No wheezing.  No chest wall deformity  Skin: no rash or lesions.  GU: no dysuria, change in color of urine, no urgency or frequency.  No flank pain, no hematuria   MS:  No joint pain or swelling.  No decreased range of motion.  No back pain.  Psych:  No change in mood or affect. No depression or anxiety.  No memory loss.   Vital Signs BP 126/70 (BP Location: Left Arm, Patient Position: Sitting, Cuff Size: Normal)    Pulse 94    Temp 98.2 F (36.8 C) (Oral)    Ht 5\' 1"  (1.549 m)    Wt 176 lb 12.8 oz (80.2 kg)    SpO2 97%    BMI 33.41 kg/m    Physical Exam:  General- No distress,  A&Ox3, pleasant ENT: No sinus tenderness, TM clear, pale nasal mucosa, no oral exudate,no post nasal drip, no LAN Cardiac: S1, S2, regular rate and rhythm, no murmur Chest: No wheeze/ rales/ dullness; no accessory muscle use, no nasal flaring, no sternal retractions, diminished per bases Abd.: Soft Non-tender, ND, BS +, Body mass index is 33.41 kg/m.  Ext: No clubbing cyanosis, trace edema Neuro:  normal strength, MAE x 4, A&O x 3 Skin: No rashes, warm and dry, no lesions  Psych: normal mood and behavior   Assessment/Plan Hypermetabolic LUL Nodule Plan There is a left upper lobe nodule that we need to take a  closer look at. Plan will plan  for a robotic bronchoscopy and biopsy with Dr. Valeta Harms on 2/21. We will place an order for a Super D CT Chest without contrast. Which needs to be done prior to the bronch You will get a call to get this scheduled.  Once we recover from assessing this nodule, we will consider PFT's to determine why you have some shortness of breath.  Follow up with Judson Roch NP or Dr. Valeta Harms after procedure. We can consider referral to Health weight and wellness also once we have assessed the nodule to address your physical deconditioning.  Please contact office for sooner follow up if symptoms do not improve or worsen or seek emergency care       Magdalen Spatz, NP 07/18/2021  5:31 PM

## 2021-07-18 NOTE — Progress Notes (Signed)
PCCM:   PET reviewed with Eric Form, NP Orders placed for bronchoscopy on 08/01/2021  Garner Nash, DO [facility] 07/18/2021 4:19 PM

## 2021-07-18 NOTE — Telephone Encounter (Signed)
error 

## 2021-07-18 NOTE — Patient Instructions (Addendum)
It is good to see you today. There is a left upper lobe nodule that we need to take a closer look at. Plan will plan  for a biopsy with Dr. Valeta Harms on 2/21. We will place an order for a Super D CT Chest without contrast. You will get a call to get this scheduled.  Once we recover from assessing this nodule, we will consider PFT's to determine why you have some shortness of breath.  We can consider referral to Health weight and wellness also once we have assessed the nodule.  Please contact office for sooner follow up if symptoms do not improve or worsen or seek emergency care

## 2021-07-18 NOTE — Telephone Encounter (Signed)
Orders have been placed for robotic bronchoscopy for 08/01/2021 with Dr. Valeta Harms. Super D CT Chest Without has been ordered.>> Needs to be done prior to bronch Dr. Valeta Harms wanted me to message you as he wanted to make sure you were aware of plans for this patient.  Thanks. Let me know if any questions or concerns.

## 2021-07-19 DIAGNOSIS — R918 Other nonspecific abnormal finding of lung field: Secondary | ICD-10-CM | POA: Diagnosis present

## 2021-07-19 NOTE — Telephone Encounter (Signed)
Pt has been scheduled for 2/21 at 3:00.  She will get CT at [facility] on 2/17 and will go for covid test on 2/17.  Gave appt info to pt.

## 2021-07-24 NOTE — Telephone Encounter (Signed)
Pt's dtr wants to make sure doctor is aware pt has hard time coming out of anesthesia and has a slow heart beat.

## 2021-07-28 ENCOUNTER — Encounter (HOSPITAL_COMMUNITY): Payer: Self-pay | Admitting: Pulmonary Disease

## 2021-07-28 ENCOUNTER — Other Ambulatory Visit: Payer: Self-pay

## 2021-07-28 ENCOUNTER — Ambulatory Visit (HOSPITAL_COMMUNITY)
Admission: RE | Admit: 2021-07-28 | Discharge: 2021-07-28 | Disposition: A | Discharge status: Home / Self Care | Payer: Medicare Other | Source: Ambulatory Visit | Attending: Pulmonary Disease | Admitting: Pulmonary Disease

## 2021-07-28 ENCOUNTER — Other Ambulatory Visit: Payer: Self-pay | Admitting: Pulmonary Disease

## 2021-07-28 DIAGNOSIS — R918 Other nonspecific abnormal finding of lung field: Secondary | ICD-10-CM | POA: Diagnosis not present

## 2021-07-28 DIAGNOSIS — I700 Atherosclerosis of aorta: Secondary | ICD-10-CM | POA: Diagnosis not present

## 2021-07-28 DIAGNOSIS — R911 Solitary pulmonary nodule: Secondary | ICD-10-CM | POA: Diagnosis not present

## 2021-07-28 IMAGING — CT CT CHEST SUPER D W/O CM
2 of 4 series · 15 of 36 positions shown, 18 images · non-contrast
Comparison: PET-CT [DATE]

CLINICAL DATA: Super D chest protocol for preop EMB biopsy.
Pulmonary nodules.

EXAM:
CT CHEST WITHOUT CONTRAST
TECHNIQUE: Multidetector CT imaging of the chest was performed using thin slice
collimation for electromagnetic bronchoscopy planning purposes,
without intravenous contrast.
RADIATION DOSE REDUCTION: This exam was performed according to the
departmental dose-optimization program which includes automated
exposure control, adjustment of the mA and/or kV according to
patient size and/or use of iterative reconstruction technique.

[Series 5: lungs · axial · 0.79mm/px · z∈[-238,+12]mm · 12 of 141 slices shown, 15 images]
[im 8/141  mediastinal]
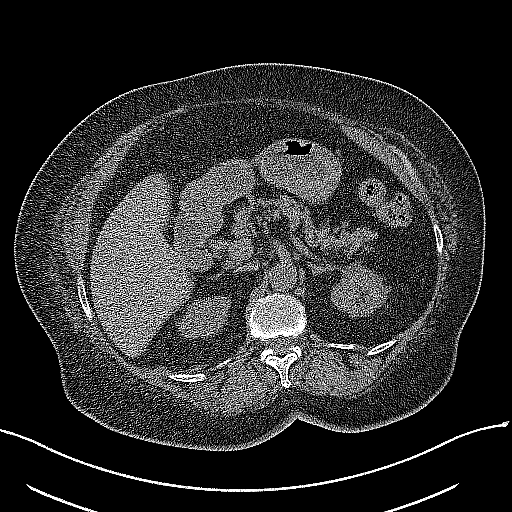
[im 8/141  lung]
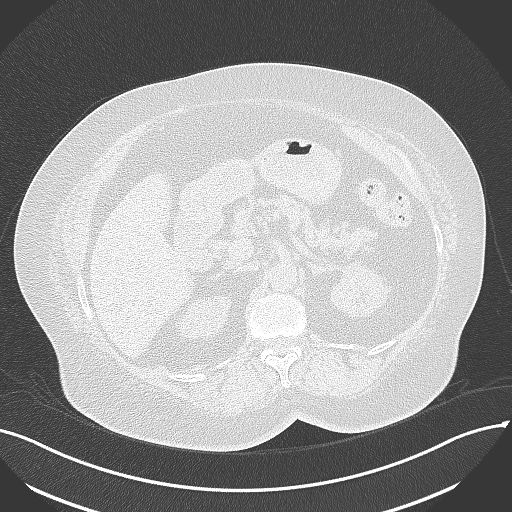
[im 23/141  lung]
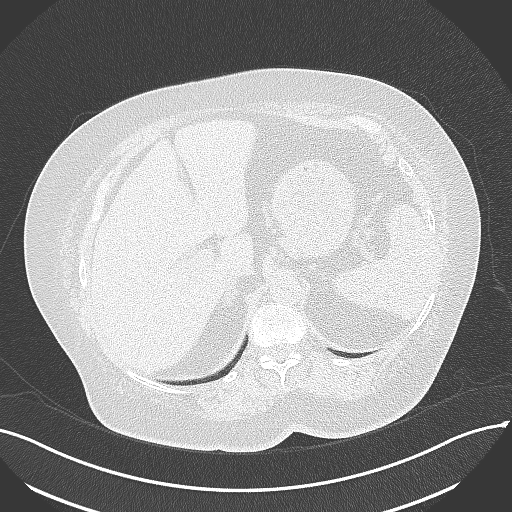
[im 30/141  lung]
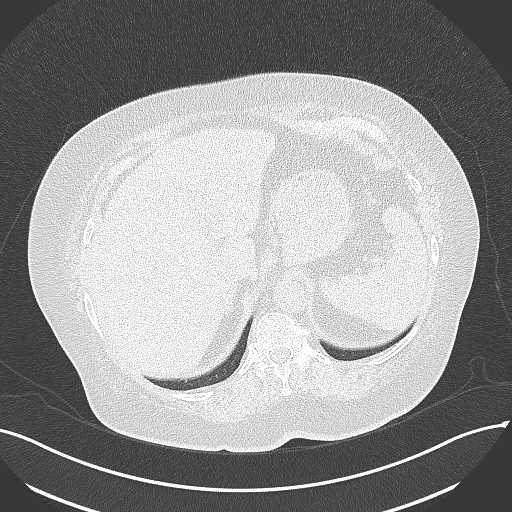
[im 45/141  lung]
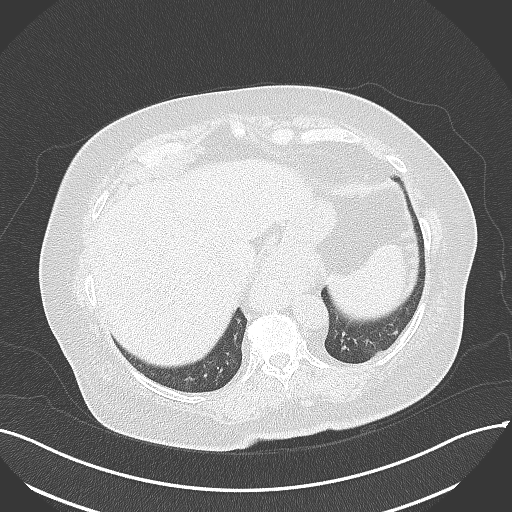
[im 52/141  mediastinal]
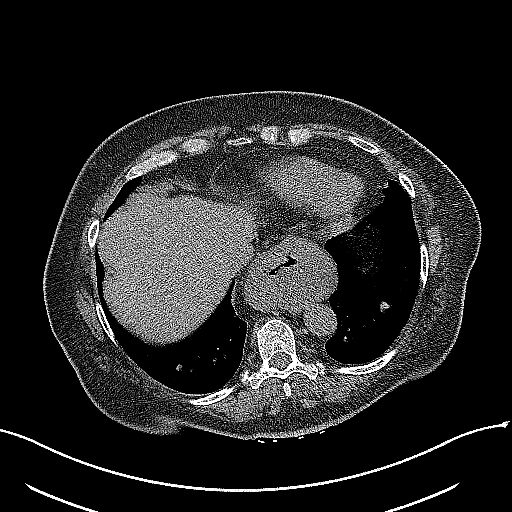
[im 52/141  lung]
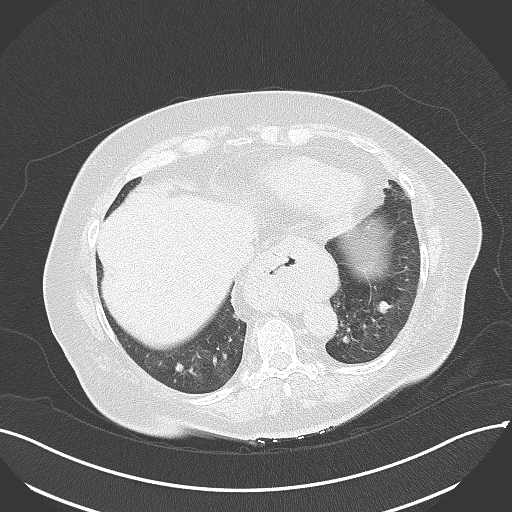
[im 67/141  lung]
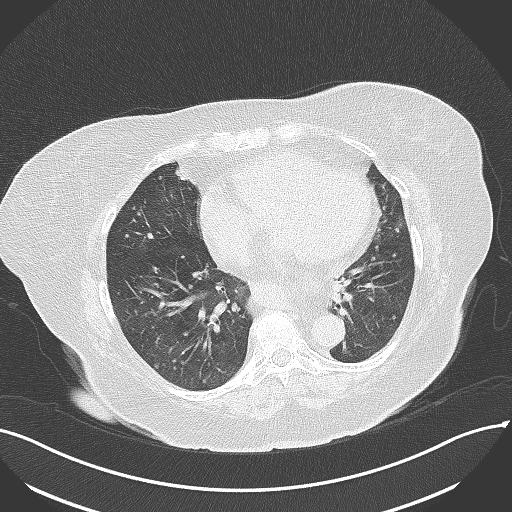
[im 74/141  lung]
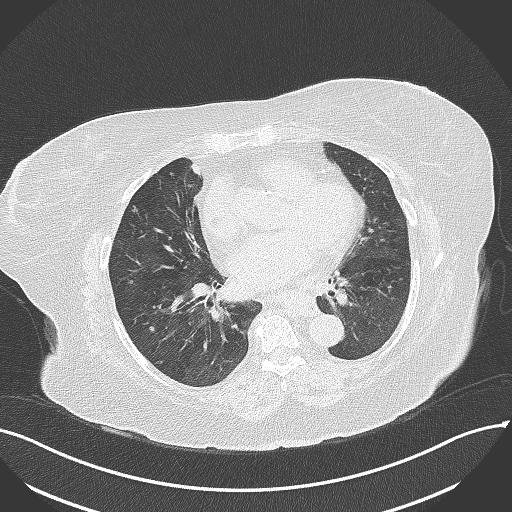
[im 89/141  lung]
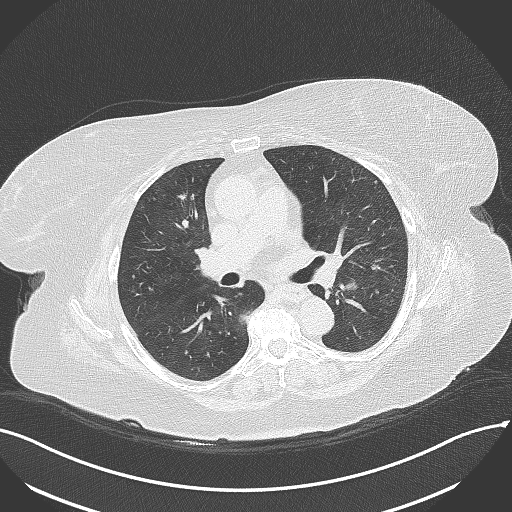
[im 96/141  mediastinal]
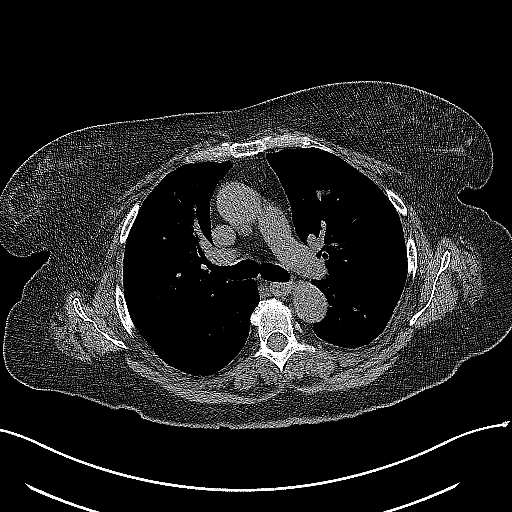
[im 96/141  lung]
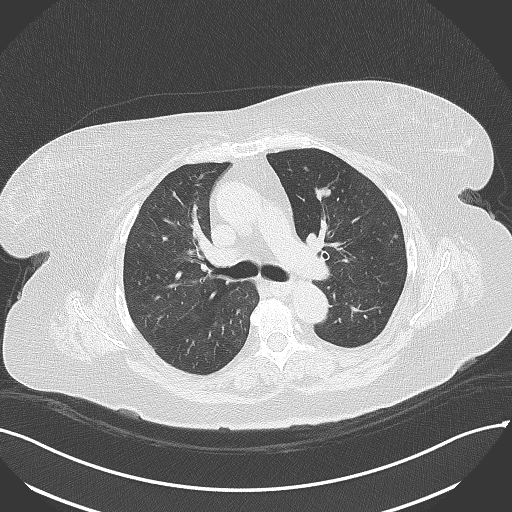
[im 111/141  lung]
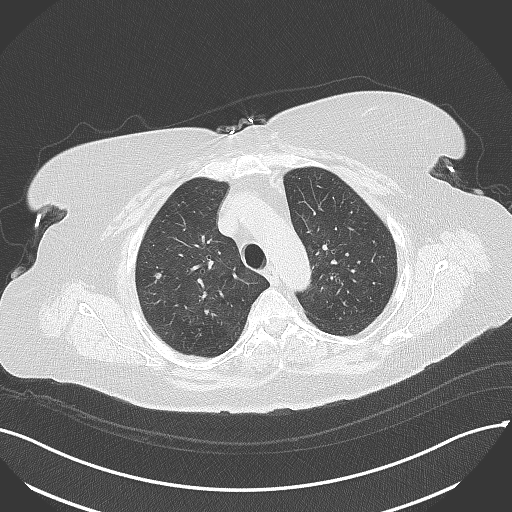
[im 118/141  lung]
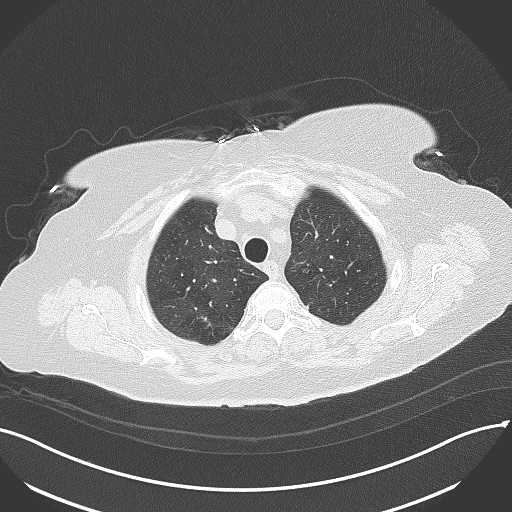
[im 133/141  lung]
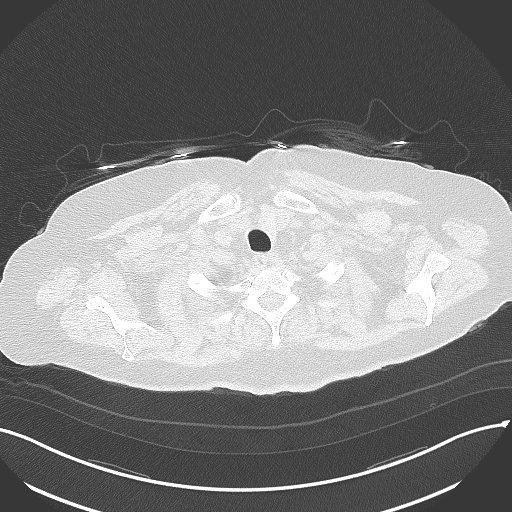

[Series 6: coronal · coronal · 0.63mm/px · 3 of 170 slices shown]
[im 34/170  lung]
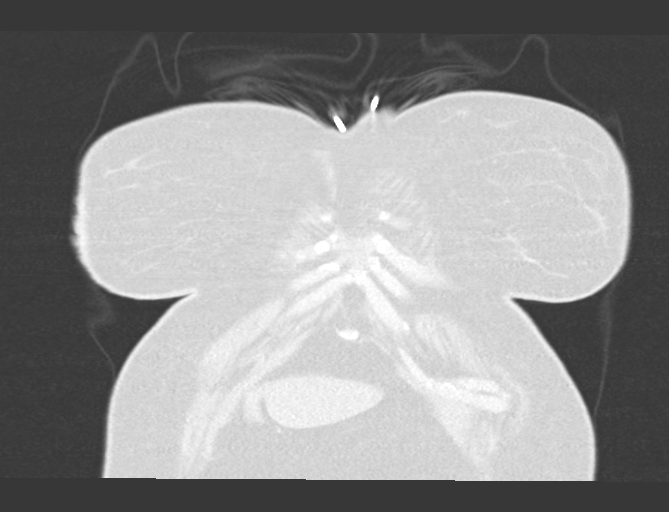
[im 68/170  lung]
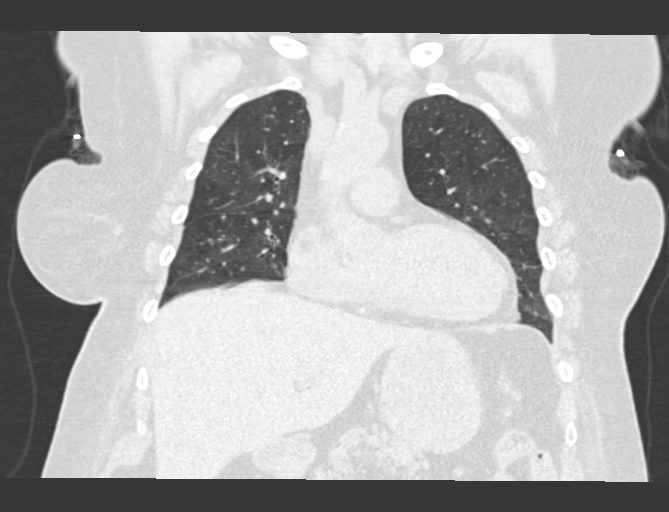
[im 102/170  lung]
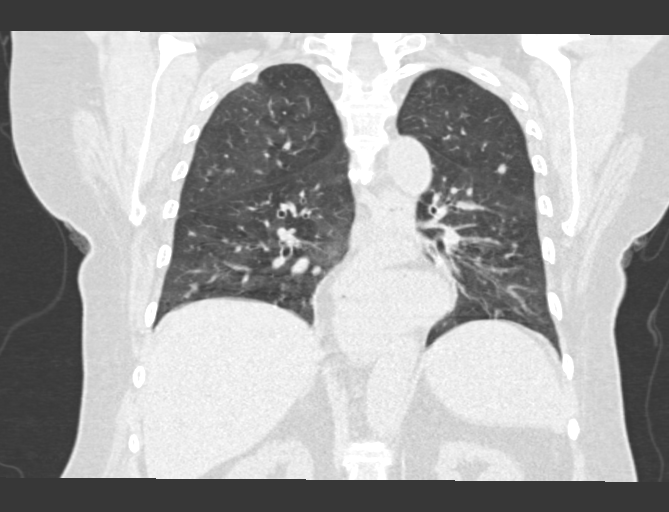

[15 of 36 positions shown; findings below may reference images not displayed]

FINDINGS: Cardiovascular: The heart is normal in size. No pericardial
effusion. The aorta is normal in caliber. Minimal atherosclerotic
calcification at the aortic arch. Scattered coronary artery
calcifications.

Mediastinum/Nodes: No mediastinal or hilar mass or lymphadenopathy.
Stable large hiatal hernia.

Lungs/Pleura: Persistent bilateral pulmonary nodules. The dominant
nodules in the left upper lobe and measures 12 x 12 mm. The showed
mild hypermetabolism on the recent PET-CT. There is also an 11 x 11
mm lesion in the left lower lobe on image 55/5 not definitely
hypermetabolic, recent PET-CT.

Upper Abdomen: No significant upper abdominal findings. No obvious
hepatic or adrenal gland lesions. No upper abdominal adenopathy.

Musculoskeletal: No significant bony findings.
IMPRESSION: 1. Persistent bilateral pulmonary nodules. The largest nodule
measures 12 x 12 mm left upper lobe.
2. No mediastinal or hilar mass or adenopathy.
3. Stable large hiatal hernia.
4. No significant upper abdominal findings.
5. Aortic atherosclerosis.

Aortic Atherosclerosis ([GR]-[GR]).

## 2021-07-28 NOTE — Progress Notes (Signed)
PCP - Dr. Lindell Noe Cardiologist - denies EKG - 06/13/21 Chest x-ray -  ECHO - 04/29/20 Cardiac Cath -  CPAP -  Fasting Blood Sugar:  130s Checks Blood Sugar:  1x/day Blood Thinner Instructions:  Aspirin Instructions:  ERAS Protcol - clears 1215 COVID TEST- pending 2/17  Anesthesia review: n/a  -------------  SDW INSTRUCTIONS:  Your procedure is scheduled on Tuesday 2/21. Please report to [facility]tr[facility] "A" at 1245 P.M., and check in at the Admitting office. Call this number if you have problems the morning of surgery: [phone]   Remember: Do not eat after midnight the night before your surgery  You may drink clear liquids until 12:15 the day of your surgery.   Clear liquids allowed are: Water, Non-Citrus Juices (without pulp), Carbonated Beverages, Clear Tea, Black Coffee Only, and Gatorade   Medications to take morning of surgery with a sip of water include: amLODipine (NORVASC) atorvastatin (LIPITOR)  DULoxetine HCl  lansoprazole (PREVACID)  loratadine (CLARITIN)   ** PLEASE check your blood sugar the morning of your surgery when you wake up and every 2 hours until you get to the Short Stay unit.  If your blood sugar is less than 70 mg/dL, you will need to treat for low blood sugar: Do not take insulin. Treat a low blood sugar (less than 70 mg/dL) with  cup of clear juice (cranberry or apple), 4 glucose tablets, OR glucose gel. Recheck blood sugar in 15 minutes after treatment (to make sure it is greater than 70 mg/dL). If your blood sugar is not greater than 70 mg/dL on recheck, call [phone] for further instructions.  glipiZIDE (GLUCOTROL XL)  2/20 take as usual 2/21 none  metFORMIN (GLUCOPHAGE-XR)  2/20 take as usual 2/21 none  As of today, STOP taking any Aspirin (unless otherwise instructed by your surgeon), Aleve, Naproxen, Ibuprofen, Motrin, Advil, Goody's, BC's, all herbal medications, fish oil, and all vitamins.    The Morning of  Surgery Do not wear jewelry, make-up or nail polish. Do not wear lotions, powders, or perfumes, or deodorant Do not bring valuables to the hospital. [facility] is not responsible for any belongings or valuables.  If you are a smoker, DO NOT Smoke 24 hours prior to surgery  If you wear a CPAP at night please bring your mask the morning of surgery   Remember that you must have someone to transport you home after your surgery, and remain with you for 24 hours if you are discharged the same day.  Please bring cases for contacts, glasses, hearing aids, dentures or bridgework because it cannot be worn into surgery.   Patients discharged the day of surgery will not be allowed to drive home.   Please shower the NIGHT BEFORE/MORNING OF SURGERY (use antibacterial soap like DIAL soap if possible). Wear comfortable clothes the morning of surgery. Oral Hygiene is also important to reduce your risk of infection.  Remember - BRUSH YOUR TEETH THE MORNING OF SURGERY WITH YOUR REGULAR TOOTHPASTE  Patient denies shortness of breath, fever, cough and chest pain.

## 2021-07-29 LAB — SARS CORONAVIRUS 2 (TAT 6-24 HRS): SARS Coronavirus 2: NEGATIVE

## 2021-08-01 ENCOUNTER — Ambulatory Visit (HOSPITAL_COMMUNITY): Payer: Medicare Other

## 2021-08-01 ENCOUNTER — Ambulatory Visit (HOSPITAL_COMMUNITY): Payer: Medicare Other | Admitting: Certified Registered Nurse Anesthetist

## 2021-08-01 ENCOUNTER — Encounter (HOSPITAL_COMMUNITY): Payer: Self-pay | Admitting: Pulmonary Disease

## 2021-08-01 ENCOUNTER — Encounter (HOSPITAL_COMMUNITY)
Admission: RE | Disposition: A | Discharge status: Home / Self Care | Payer: Self-pay | Source: Ambulatory Visit | Attending: Pulmonary Disease

## 2021-08-01 ENCOUNTER — Ambulatory Visit (HOSPITAL_COMMUNITY)
Admission: RE | Admit: 2021-08-01 | Discharge: 2021-08-01 | Disposition: A | Discharge status: Home / Self Care | Payer: Medicare Other | Source: Ambulatory Visit | Attending: Pulmonary Disease | Admitting: Pulmonary Disease

## 2021-08-01 ENCOUNTER — Ambulatory Visit (HOSPITAL_BASED_OUTPATIENT_CLINIC_OR_DEPARTMENT_OTHER): Payer: Medicare Other | Admitting: Certified Registered Nurse Anesthetist

## 2021-08-01 DIAGNOSIS — Z7984 Long term (current) use of oral hypoglycemic drugs: Secondary | ICD-10-CM

## 2021-08-01 DIAGNOSIS — E1122 Type 2 diabetes mellitus with diabetic chronic kidney disease: Secondary | ICD-10-CM | POA: Insufficient documentation

## 2021-08-01 DIAGNOSIS — R0989 Other specified symptoms and signs involving the circulatory and respiratory systems: Secondary | ICD-10-CM | POA: Insufficient documentation

## 2021-08-01 DIAGNOSIS — I129 Hypertensive chronic kidney disease with stage 1 through stage 4 chronic kidney disease, or unspecified chronic kidney disease: Secondary | ICD-10-CM | POA: Insufficient documentation

## 2021-08-01 DIAGNOSIS — R918 Other nonspecific abnormal finding of lung field: Secondary | ICD-10-CM | POA: Diagnosis not present

## 2021-08-01 DIAGNOSIS — C3432 Malignant neoplasm of lower lobe, left bronchus or lung: Secondary | ICD-10-CM | POA: Insufficient documentation

## 2021-08-01 DIAGNOSIS — J90 Pleural effusion, not elsewhere classified: Secondary | ICD-10-CM | POA: Diagnosis not present

## 2021-08-01 DIAGNOSIS — I10 Essential (primary) hypertension: Secondary | ICD-10-CM

## 2021-08-01 DIAGNOSIS — K449 Diaphragmatic hernia without obstruction or gangrene: Secondary | ICD-10-CM | POA: Insufficient documentation

## 2021-08-01 DIAGNOSIS — R911 Solitary pulmonary nodule: Secondary | ICD-10-CM | POA: Diagnosis not present

## 2021-08-01 DIAGNOSIS — Z79899 Other long term (current) drug therapy: Secondary | ICD-10-CM | POA: Insufficient documentation

## 2021-08-01 DIAGNOSIS — F32A Depression, unspecified: Secondary | ICD-10-CM | POA: Diagnosis not present

## 2021-08-01 DIAGNOSIS — N183 Chronic kidney disease, stage 3 unspecified: Secondary | ICD-10-CM | POA: Insufficient documentation

## 2021-08-01 DIAGNOSIS — Z9889 Other specified postprocedural states: Secondary | ICD-10-CM

## 2021-08-01 DIAGNOSIS — E119 Type 2 diabetes mellitus without complications: Secondary | ICD-10-CM | POA: Diagnosis not present

## 2021-08-01 DIAGNOSIS — K219 Gastro-esophageal reflux disease without esophagitis: Secondary | ICD-10-CM | POA: Insufficient documentation

## 2021-08-01 DIAGNOSIS — J811 Chronic pulmonary edema: Secondary | ICD-10-CM | POA: Diagnosis not present

## 2021-08-01 DIAGNOSIS — Z419 Encounter for procedure for purposes other than remedying health state, unspecified: Secondary | ICD-10-CM

## 2021-08-01 HISTORY — PX: BRONCHIAL BIOPSY: SHX5109

## 2021-08-01 HISTORY — PX: VIDEO BRONCHOSCOPY WITH RADIAL ENDOBRONCHIAL ULTRASOUND: SHX6849

## 2021-08-01 HISTORY — PX: BRONCHIAL NEEDLE ASPIRATION BIOPSY: SHX5106

## 2021-08-01 LAB — CBC
HCT: 36.2 % (ref 36.0–46.0)
Hemoglobin: 11.5 g/dL — ABNORMAL LOW (ref 12.0–15.0)
MCH: 29 pg (ref 26.0–34.0)
MCHC: 31.8 g/dL (ref 30.0–36.0)
MCV: 91.2 fL (ref 80.0–100.0)
Platelets: 262 10*3/uL (ref 150–400)
RBC: 3.97 MIL/uL (ref 3.87–5.11)
RDW: 15.2 % (ref 11.5–15.5)
WBC: 5.9 10*3/uL (ref 4.0–10.5)
nRBC: 0 % (ref 0.0–0.2)

## 2021-08-01 LAB — GLUCOSE, CAPILLARY
Glucose-Capillary: 160 mg/dL — ABNORMAL HIGH (ref 70–99)
Glucose-Capillary: 177 mg/dL — ABNORMAL HIGH (ref 70–99)

## 2021-08-01 LAB — BASIC METABOLIC PANEL
Anion gap: 12 (ref 5–15)
BUN: 19 mg/dL (ref 8–23)
CO2: 21 mmol/L — ABNORMAL LOW (ref 22–32)
Calcium: 9.2 mg/dL (ref 8.9–10.3)
Chloride: 104 mmol/L (ref 98–111)
Creatinine, Ser: 0.95 mg/dL (ref 0.44–1.00)
GFR, Estimated: >60 mL/min (ref >60)
Glucose, Bld: 156 mg/dL — ABNORMAL HIGH (ref 70–99)
Potassium: 4 mmol/L (ref 3.5–5.1)
Sodium: 137 mmol/L (ref 135–145)

## 2021-08-01 IMAGING — DX DG CHEST 1V PORT
1 series · 1 of 1 positions shown · non-contrast
Comparison: CT chest without contrast [DATE]

CLINICAL DATA: Status post bronchoscopy. Biopsy.

EXAM:
PORTABLE CHEST 1 VIEW

[chest]
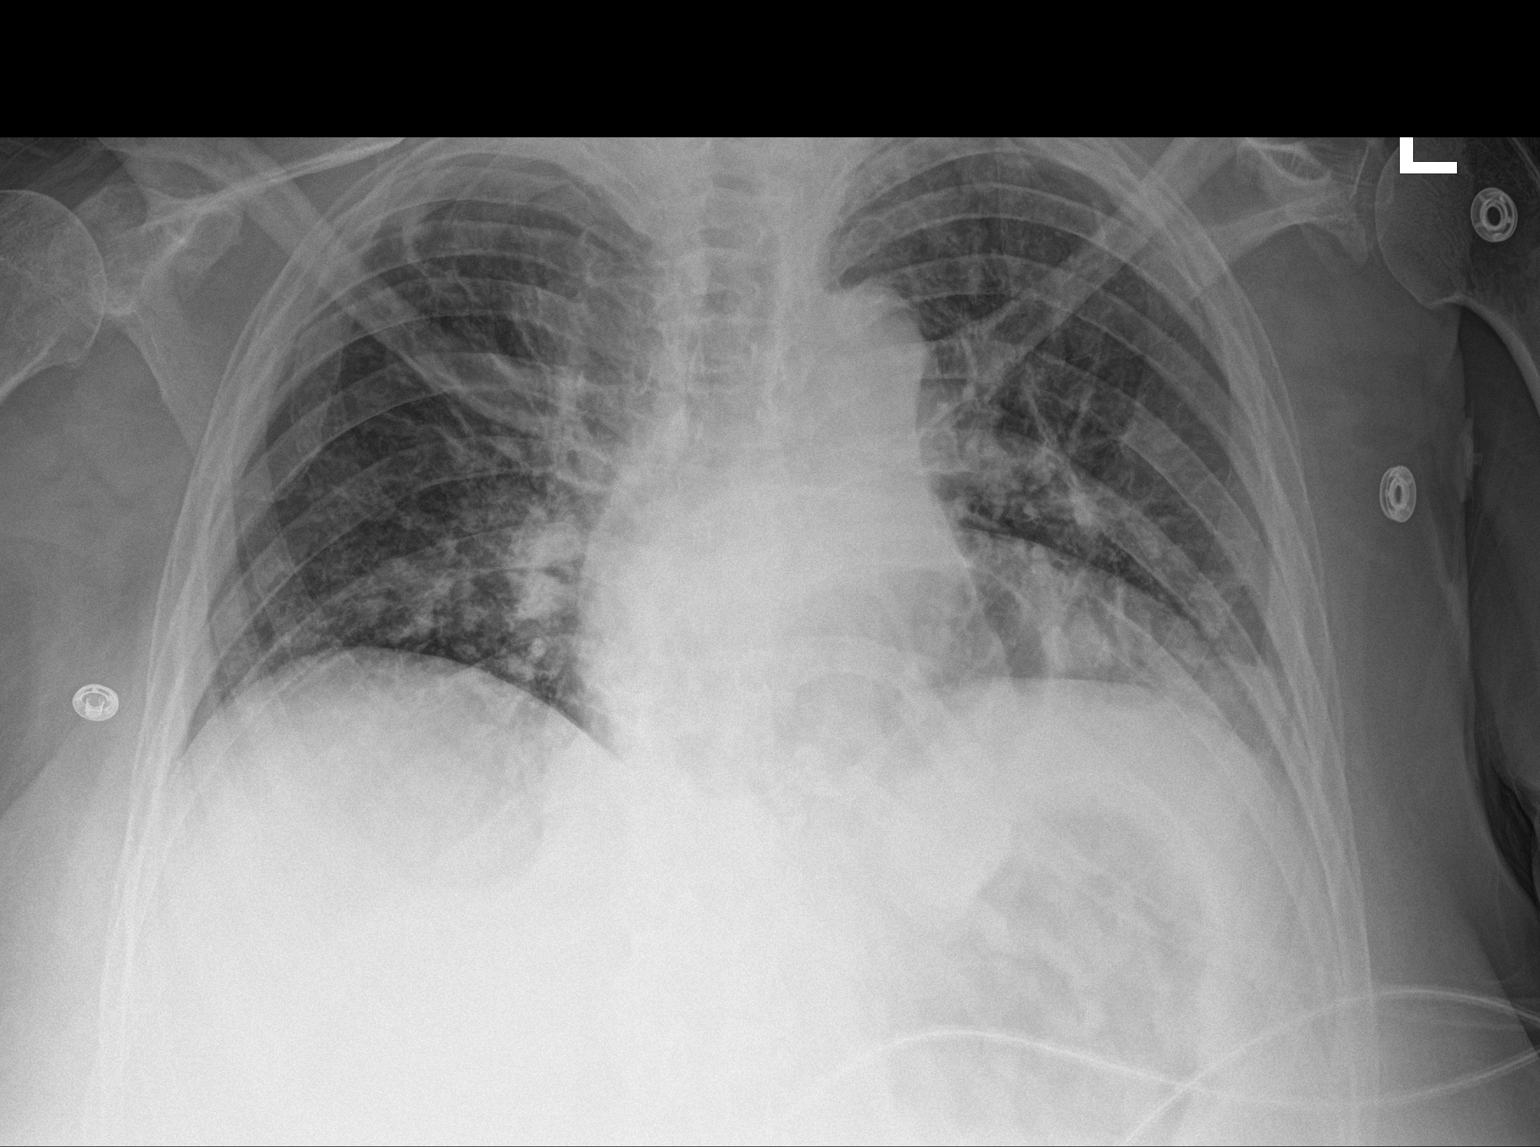

[1 of 1 positions shown; findings below may reference images not displayed]

FINDINGS: Heart size is normal. Large hiatal hernia is noted. Lung volumes are
low with pulmonary vascular congestion. No pneumothorax is present.
Small left effusion noted.
IMPRESSION: 1. No pneumothorax.
2. Small left pleural effusion.
3. Low lung volumes with pulmonary vascular congestion.
4. Large hiatal hernia.

## 2021-08-01 SURGERY — BRONCHOSCOPY, WITH BIOPSY USING ELECTROMAGNETIC NAVIGATION
Anesthesia: General | Laterality: Bilateral

## 2021-08-01 MED ORDER — ONDANSETRON HCL 4 MG/2ML IJ SOLN
INTRAMUSCULAR | Status: DC | PRN
  Administered 2021-08-01: 4 mg via INTRAVENOUS

## 2021-08-01 MED ORDER — LIDOCAINE 2% (20 MG/ML) 5 ML SYRINGE
INTRAMUSCULAR | Status: DC | PRN
  Administered 2021-08-01: 40 mg via INTRAVENOUS

## 2021-08-01 MED ORDER — DEXAMETHASONE SODIUM PHOSPHATE 10 MG/ML IJ SOLN
INTRAMUSCULAR | Status: DC | PRN
Start: 2021-08-01 — End: 2021-08-01
  Administered 2021-08-01: 10 mg via INTRAVENOUS

## 2021-08-01 MED ORDER — INSULIN ASPART 100 UNIT/ML IJ SOLN
0.0000 [IU] | INTRAMUSCULAR | Status: DC | PRN
  Filled 2021-08-01: qty 0.07

## 2021-08-01 MED ORDER — PROPOFOL 10 MG/ML IV BOLUS
INTRAVENOUS | Status: DC | PRN
  Administered 2021-08-01: 140 mg via INTRAVENOUS

## 2021-08-01 MED ORDER — LACTATED RINGERS IV SOLN: INTRAVENOUS | Status: DC

## 2021-08-01 MED ORDER — ACETAMINOPHEN 500 MG PO TABS: 1000.0000 mg | ORAL_TABLET | Freq: Once | ORAL | Status: DC

## 2021-08-01 MED ORDER — ROCURONIUM BROMIDE 10 MG/ML (PF) SYRINGE
PREFILLED_SYRINGE | INTRAVENOUS | Status: DC | PRN
  Administered 2021-08-01: 50 mg via INTRAVENOUS

## 2021-08-01 MED ORDER — CHLORHEXIDINE GLUCONATE 0.12 % MT SOLN
15.0000 mL | Freq: Once | OROMUCOSAL | Status: AC
  Filled 2021-08-01: qty 15

## 2021-08-01 MED ORDER — FENTANYL CITRATE (PF) 250 MCG/5ML IJ SOLN
INTRAMUSCULAR | Status: DC | PRN
  Administered 2021-08-01: 50 ug via INTRAVENOUS

## 2021-08-01 MED ORDER — PHENYLEPHRINE HCL-NACL 20-0.9 MG/250ML-% IV SOLN
INTRAVENOUS | Status: DC | PRN
  Administered 2021-08-01: 25 ug/min via INTRAVENOUS

## 2021-08-01 MED ORDER — ALBUTEROL SULFATE HFA 108 (90 BASE) MCG/ACT IN AERS
INHALATION_SPRAY | RESPIRATORY_TRACT | Status: DC | PRN
  Administered 2021-08-01: 8 via RESPIRATORY_TRACT

## 2021-08-01 MED ORDER — FENTANYL CITRATE (PF) 100 MCG/2ML IJ SOLN: 25.0000 ug | INTRAMUSCULAR | Status: DC | PRN

## 2021-08-01 MED ORDER — PHENYLEPHRINE 40 MCG/ML (10ML) SYRINGE FOR IV PUSH (FOR BLOOD PRESSURE SUPPORT)
PREFILLED_SYRINGE | INTRAVENOUS | Status: DC | PRN
  Administered 2021-08-01 (×2): 80 ug via INTRAVENOUS

## 2021-08-01 MED ORDER — CHLORHEXIDINE GLUCONATE 0.12 % MT SOLN
OROMUCOSAL | Status: AC
  Administered 2021-08-01: 15 mL via OROMUCOSAL
  Filled 2021-08-01: qty 15

## 2021-08-01 MED ORDER — SUGAMMADEX SODIUM 200 MG/2ML IV SOLN
INTRAVENOUS | Status: DC | PRN
Start: 2021-08-01 — End: 2021-08-01
  Administered 2021-08-01: 200 mg via INTRAVENOUS

## 2021-08-01 NOTE — Transfer of Care (Signed)
Immediate Anesthesia Transfer of Care Note  Patient: Michaela Santana  Procedure(s) Performed: ROBOTIC ASSISTED NAVIGATIONAL BRONCHOSCOPY (Bilateral) BRONCHIAL NEEDLE ASPIRATION BIOPSIES BRONCHIAL BIOPSIES RADIAL ENDOBRONCHIAL ULTRASOUND  Patient Location: Endoscopy Unit  Anesthesia Type:General  Level of Consciousness: drowsy  Airway & Oxygen Therapy: Patient Spontanous Breathing and Patient connected to nasal cannula oxygen  Post-op Assessment: Report given to RN and Post -op Vital signs reviewed and stable  Post vital signs: Reviewed and stable  Last Vitals:  Vitals Value Taken Time  BP 132/63 08/01/21 1600  Temp    Pulse 86 08/01/21 1604  Resp 19 08/01/21 1604  SpO2 94 % 08/01/21 1604  Vitals shown include unvalidated device data.  Last Pain:  Vitals:   08/01/21 1600  TempSrc:   PainSc: 0-No pain      Patients Stated Pain Goal: 2 (24/58/09 9833)  Complications: No notable events documented.

## 2021-08-01 NOTE — Interval H&P Note (Signed)
History and Physical Interval Note:  08/01/2021 2:34 PM  Michaela Santana  has presented today for surgery, with the diagnosis of lung nodules.  The various methods of treatment have been discussed with the patient and family. After consideration of risks, benefits and other options for treatment, the patient has consented to  Procedure(s) with comments: ROBOTIC ASSISTED NAVIGATIONAL BRONCHOSCOPY (Bilateral) - ION w/ CIOS as a surgical intervention.  The patient's history has been reviewed, patient examined, no change in status, stable for surgery.  I have reviewed the patient's chart and labs.  Questions were answered to the patient's satisfaction.     [place]

## 2021-08-01 NOTE — Anesthesia Procedure Notes (Signed)
Procedure Name: Intubation Date/Time: 08/01/2021 2:49 PM Performed by: Carolan Clines, CRNA Pre-anesthesia Checklist: Patient identified, Emergency Drugs available, Suction available and Patient being monitored Patient Re-evaluated:Patient Re-evaluated prior to induction Oxygen Delivery Method: Circle System Utilized Preoxygenation: Pre-oxygenation with 100% oxygen Induction Type: IV induction Ventilation: Mask ventilation without difficulty Laryngoscope Size: Mac and 3 Grade View: Grade I Tube type: Oral Tube size: 8.5 mm Number of attempts: 1 Airway Equipment and Method: Stylet Placement Confirmation: ETT inserted through vocal cords under direct vision, positive ETCO2 and breath sounds checked- equal and bilateral Secured at: 22 cm Tube secured with: Tape Dental Injury: Teeth and Oropharynx as per pre-operative assessment

## 2021-08-01 NOTE — Progress Notes (Signed)
Turned off oxygen, po dropped to 82, o2 at 2 l reapplied, patient assisted in using insentive spirometer

## 2021-08-01 NOTE — Op Note (Addendum)
Video Bronchoscopy with Robotic Assisted Bronchoscopic Navigation   Date of Operation: 08/01/2021   Pre-op Diagnosis: Multiple pulmonary nodules  Post-op Diagnosis: Multiple pulmonary nodules  Surgeon: Garner Nash, DO   Assistants: None   Anesthesia: General endotracheal anesthesia  Operation: Flexible video fiberoptic bronchoscopy with robotic assistance and biopsies.  Estimated Blood Loss: Minimal  Complications: None  Indications and History: Michaela Santana is a 76 y.o. female with history of multiple pulmonary nodules. The risks, benefits, complications, treatment options and expected outcomes were discussed with the patient.  The possibilities of pneumothorax, pneumonia, reaction to medication, pulmonary aspiration, perforation of a viscus, bleeding, failure to diagnose a condition and creating a complication requiring transfusion or operation were discussed with the patient who freely signed the consent.    Description of Procedure: The patient was seen in the Preoperative Area, was examined and was deemed appropriate to proceed.  The patient was taken to [facility] endoscopy room 3, identified as Michaela Santana and the procedure verified as Flexible Video Fiberoptic Bronchoscopy.  A Time Out was held and the above information confirmed.   Prior to the date of the procedure a high-resolution CT scan of the chest was performed. Utilizing ION software program a virtual tracheobronchial tree was generated to allow the creation of distinct navigation pathways to the patient's parenchymal abnormalities. After being taken to the operating room general anesthesia was initiated and the patient  was orally intubated. The video fiberoptic bronchoscope was introduced via the endotracheal tube and a general inspection was performed which showed normal right and left lung anatomy, aspiration of the bilateral mainstems was completed to remove any remaining secretions. Robotic catheter inserted into  patient's endotracheal tube.   Target #1 multiple pulmonary nodules, left upper lobe pulmonary nodule: The distinct navigation pathways prepared prior to this procedure were then utilized to navigate to patient's lesion identified on CT scan. The robotic catheter was secured into place and the vision probe was withdrawn.  Lesion location was approximated using fluoroscopy and radial endobronchial ultrasound for peripheral targeting. Under fluoroscopic guidance transbronchial needle brushings, transbronchial needle biopsies, and transbronchial forceps biopsies were performed to be sent for cytology and pathology.  At the end of the procedure a general airway inspection was performed and there was no evidence of active bleeding. The bronchoscope was removed.  The patient tolerated the procedure well. There was no significant blood loss and there were no obvious complications. A post-procedural chest x-ray is pending.  Samples Target #1: 1. Transbronchial needle brushings from left upper lobe 2. Transbronchial Wang needle biopsies from left upper lobe 3. Transbronchial forceps biopsies from left upper lobe  Plans:  The patient will be discharged from the PACU to home when recovered from anesthesia and after chest x-ray is reviewed. We will review the cytology, pathology results with the patient when they become available. Outpatient followup will be with Garner Nash, DO.   Garner Nash, DO [facility] 08/01/2021 4:05 PM

## 2021-08-01 NOTE — Anesthesia Preprocedure Evaluation (Addendum)
Anesthesia Evaluation  Patient identified by MRN, date of birth, ID band Patient awake    Reviewed: Allergy & Precautions, NPO status , Patient's Chart, lab work & pertinent test results  History of Anesthesia Complications Negative for: history of anesthetic complications  Airway Mallampati: II  TM Distance: >3 FB Neck ROM: Full    Dental  (+) Dental Advisory Given, Caps   Pulmonary  07/28/2021 SARS coronavirus NEG   breath sounds clear to auscultation       Cardiovascular hypertension, Pt. on medications (-) angina Rhythm:Regular Rate:Normal  '22 Stress: electrically negative for ischemia, Myovue scan shows normal perfusion, No ischemia, LVEF is 70%  '21 ECHO: EF 70%. The LV has normal function, no regional wall motion abnormalities. Grade I DD, no sig valvular abnormalities   Neuro/Psych Depression glaucoma negative neurological ROS     GI/Hepatic Neg liver ROS, GERD  Medicated and Controlled,  Endo/Other  diabetes (glu 160), Oral Hypoglycemic Agents  Renal/GU Renal InsufficiencyRenal disease     Musculoskeletal   Abdominal (+) + obese,   Peds  Hematology   Anesthesia Other Findings   Reproductive/Obstetrics                            Anesthesia Physical Anesthesia Plan  ASA: 3  Anesthesia Plan: General   Post-op Pain Management: Tylenol PO (pre-op)*   Induction: Intravenous  PONV Risk Score and Plan: 3 and Ondansetron, Dexamethasone and Treatment may vary due to age or medical condition  Airway Management Planned: Oral ETT  Additional Equipment: None  Intra-op Plan:   Post-operative Plan: Extubation in OR  Informed Consent: I have reviewed the patients History and Physical, chart, labs and discussed the procedure including the risks, benefits and alternatives for the proposed anesthesia with the patient or authorized representative who has indicated his/her understanding  and acceptance.     Dental advisory given  Plan Discussed with: CRNA and Surgeon  Anesthesia Plan Comments:        Anesthesia Quick Evaluation

## 2021-08-02 ENCOUNTER — Encounter (HOSPITAL_COMMUNITY): Payer: Self-pay | Admitting: Pulmonary Disease

## 2021-08-03 NOTE — Anesthesia Postprocedure Evaluation (Signed)
Anesthesia Post Note  Patient: Michaela Santana  Procedure(s) Performed: ROBOTIC ASSISTED NAVIGATIONAL BRONCHOSCOPY (Bilateral) BRONCHIAL NEEDLE ASPIRATION BIOPSIES BRONCHIAL BIOPSIES RADIAL ENDOBRONCHIAL ULTRASOUND     Patient location during evaluation: PACU Anesthesia Type: General Level of consciousness: awake and alert Pain management: pain level controlled Vital Signs Assessment: post-procedure vital signs reviewed and stable Respiratory status: spontaneous breathing, nonlabored ventilation, respiratory function stable and patient connected to nasal cannula oxygen Cardiovascular status: blood pressure returned to baseline and stable Postop Assessment: no apparent nausea or vomiting Anesthetic complications: no   No notable events documented.  Last Vitals:  Vitals:   08/01/21 1659 08/01/21 1709  BP: (!) 117/59 (!) 116/46  Pulse: 81 84  Resp: (!) 22 (!) 21  Temp:    SpO2: 90% 92%    Last Pain:  Vitals:   08/01/21 1659  TempSrc:   PainSc: 0-No pain                 Effie Berkshire

## 2021-08-04 ENCOUNTER — Ambulatory Visit: Payer: Medicare Other | Admitting: Acute Care

## 2021-08-07 ENCOUNTER — Encounter: Payer: Self-pay | Admitting: Pulmonary Disease

## 2021-08-08 ENCOUNTER — Telehealth: Payer: Self-pay | Admitting: Pulmonary Disease

## 2021-08-08 DIAGNOSIS — E1122 Type 2 diabetes mellitus with diabetic chronic kidney disease: Secondary | ICD-10-CM | POA: Diagnosis not present

## 2021-08-08 DIAGNOSIS — N183 Chronic kidney disease, stage 3 unspecified: Secondary | ICD-10-CM | POA: Diagnosis not present

## 2021-08-08 DIAGNOSIS — R5381 Other malaise: Secondary | ICD-10-CM | POA: Diagnosis not present

## 2021-08-08 LAB — CYTOLOGY - NON PAP

## 2021-08-08 NOTE — Telephone Encounter (Signed)
Lm for patient.  

## 2021-08-08 NOTE — Telephone Encounter (Signed)
Michaela Santana daughter is returning phone call. Michaela Santana phone number is [phone].

## 2021-08-08 NOTE — Telephone Encounter (Signed)
PCCM:  I called spoke with the patient regarding recent pathology results.  Pathology is concerning for malignancy.  IHC staining has not yet been completed.  I called and spoke with Dr. Melina Copa from pathology.  Once we have IHC staining hopefully we can identify origin of malignancy.  [facility] 08/08/2021 12:32 PM

## 2021-08-08 NOTE — Telephone Encounter (Signed)
Michaela Nash, DO     12:33 PM Note PCCM:  I called spoke with the patient regarding recent pathology results.  Pathology is concerning for malignancy.  IHC staining has not yet been completed.  I called and spoke with Dr. Melina Copa from pathology.  Once we have IHC staining hopefully we can identify origin of malignancy.  [facility] 08/08/2021 12:32 PM

## 2021-08-08 NOTE — Progress Notes (Signed)
History of Present Illness Michaela Santana is a 76 y.o. female with past medical history of hypertension, diabetes type 2, vitamin D deficiency, chronic kidney disease stage III, gastroesophageal reflux disease. She has been referrered to Dr. Valeta Harms for bilateral pulmonary nodules worrisome for metastasis. 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 Pathology stained for additional markers, but there has been no definitive identity origin of the malignancy. She is here for follow up, and plan for further tissue sampling.    08/11/2021 Pt. Presents for follow up. She underwent Flexible video fiberoptic bronchoscopy with robotic assistance and biopsies on 08/01/2021. Results were + for malignancy, but no definitive identity origin of the malignancy has been established, and there is no additional tissue for further staining/ testing . For further identification of malignancy, we will need to have additional tissue Options explained were CT Guided needle biopsy , and repeat bronchoscopy. We discussed that risk for complications is slightly higher with the CT Guided needle biopsy, but that this does not require  General anesthesia. We discussed the she would like to have Dr. Laurence Ferrari weigh in on whether CT-guided needle biopsy .  If he feels the case would be difficult she would opt for navigational bronchoscopy per Dr. Valeta Harms.  I discussed this plan with Dr. Valeta Harms and he was in agreement.  I have messaged Dr. Laurence Ferrari, he will evaluate patient imaging and let us know if he feels CT-guided needle biopsy is a viable option for additional tissue for further testing.  Patient is anxious to know specifics of lung cancer diagnosis.  I assured her that we will obtain additional tissue for sampling using either CT-guided needle biopsy or transbronchial cryobiopsy to ensure adequate tissue for further testing.  Patient is here today with her  daughters who are supporting their mother.  All are in agreement with this plan.  Because patient is a never smoker, further testing for DNA and molecular evaluation are very important to determine best plan of care.  We will refer to medical oncology once we have definitive diagnosis.   Test Results: 08/01/2021 FINAL MICROSCOPIC DIAGNOSIS:  A. LUNG, LLL, FINE NEEDLE ASPIRATION:  - Malignant cells present   SPECIMEN ADEQUACY:  A. Satisfactory for Evaluation   Super D CT Chest  07/31/2021 Persistent bilateral pulmonary nodules. The largest nodule measures 12 x 12 mm left upper lobe. 2. No mediastinal or hilar mass or adenopathy. 3. Stable large hiatal hernia. 4. No significant upper abdominal findings. 5. Aortic atherosclerosis.  07/14/2021 PET scan Innumerable nodules almost all less than 1 cm and nearly all without substantial FDG uptake but small size limiting assessment. Dominant nodule in the LEFT upper lobe with mild to moderate uptake, pattern remains highly concerning for metastatic process.   No signs of adenopathy in the chest. No signs of hypermetabolic disease elsewhere in the neck, chest, abdomen or in the pelvis.   Incidental findings of large hiatal hernia, aortic atherosclerosis and spinal degenerative changes.  CBC Latest Ref Rng & Units 08/01/2021 06/08/2021 07/18/2016  WBC 4.0 - 10.5 K/uL 5.9 12.7(H) 5.1  Hemoglobin 12.0 - 15.0 g/dL 11.5(L) 11.5(L) 12.6  Hematocrit 36.0 - 46.0 % 36.2 36.3 36.8  Platelets 150 - 400 K/uL 262 277 206    BMP Latest Ref Rng & Units 08/01/2021 06/09/2021 06/08/2021  Glucose 70 - 99 mg/dL 156(H) 208(H) 202(H)  BUN 8 - 23 mg/dL 19 33(H) 36(H)  Creatinine 0.44 -  1.00 mg/dL 0.95 1.20(H) 1.21(H)  Sodium 135 - 145 mmol/L 137 133(L) 134(L)  Potassium 3.5 - 5.1 mmol/L 4.0 3.4(L) 3.5  Chloride 98 - 111 mmol/L 104 99 97(L)  CO2 22 - 32 mmol/L 21(L) 23 23  Calcium 8.9 - 10.3 mg/dL 9.2 7.6(L) 8.6(L)    BNP    Component Value Date/Time    BNP 38.3 04/07/2020 0932   BNP 29.3 07/17/2016 2110    ProBNP No results found for: PROBNP  PFT No results found for: FEV1PRE, FEV1POST, FVCPRE, FVCPOST, TLC, DLCOUNC, PREFEV1FVCRT, PSTFEV1FVCRT  NM PET Image Initial (PI) Skull Base To Thigh (F-18 FDG)  Result Date: 07/16/2021 CLINICAL DATA:  Initial treatment strategy for numerous pulmonary nodules found to be concerning for pulmonary metastatic disease on a prior abdominal and pelvic CT. EXAM: NUCLEAR MEDICINE PET SKULL BASE TO THIGH TECHNIQUE: 8.7 mCi F-18 FDG was injected intravenously. Full-ring PET imaging was performed from the skull base to thigh after the radiotracer. CT data was obtained and used for attenuation correction and anatomic localization. Fasting blood glucose: 77 mg/dl COMPARISON:  June 09, 2021. FINDINGS: Mediastinal blood pool activity: SUV max 2.50 Liver activity: SUV max NA NECK: No hypermetabolic lymph nodes in the neck. Incidental CT findings: none CHEST: No signs of hypermetabolic nodal tissue in the chest. Diffuse randomly distributed innumerable pulmonary nodules seen in the RIGHT and LEFT chest with basilar predominance. Patchy ground-glass with mosaic pattern. The dominant nodule in the LEFT upper lobe (image 64/4) maximum SUV of 3.3 measuring 12 mm. LEFT lower lobe pulmonary nodule (image 92/4) 10 mm just above the LEFT hemidiaphragm maximum measured SUV in this area 1.7. Multiple nodules less than 1 cm, some in the range of 2-3 mm scattered randomly throughout the remainder of the chest again with mid and lower chest predominance. None with marked increased metabolic activity though size does limit assessment on the PET portion of the evaluation particularly with respect to basilar nodules. Nodules that were seen previously on the CT of June 09, 2021 show no substantial change accounting for motion that is seen on both studies at the lung bases. Incidental CT findings: Calcified atheromatous plaque in the  thoracic aorta. The mild coronary artery calcifications. Mild cardiomegaly without pericardial effusion. Normal caliber central pulmonary vessels. Limited assessment of cardiovascular structures given lack of intravenous contrast. ABDOMEN/PELVIS: No abnormal hypermetabolic activity within the liver, pancreas, adrenal glands, or spleen. No hypermetabolic lymph nodes in the abdomen or pelvis. Incidental CT findings: No acute findings relative to liver, pancreas, spleen, adrenal glands or kidneys. Signs of mild renal cortical scarring bilaterally. No hydronephrosis. No perivesical stranding. Large hiatal hernia with approximately 50% of the stomach herniated into the chest. No acute gastrointestinal process. Colonic diverticulosis. Aortic atherosclerosis without aneurysm. SKELETON: No focal hypermetabolic activity to suggest skeletal metastasis. Incidental CT findings: Spinal degenerative changes. IMPRESSION: Innumerable nodules almost all less than 1 cm and nearly all without substantial FDG uptake but small size limiting assessment. Dominant nodule in the LEFT upper lobe with mild to moderate uptake, pattern remains highly concerning for metastatic process. No signs of adenopathy in the chest. No signs of hypermetabolic disease elsewhere in the neck, chest, abdomen or in the pelvis. Incidental findings of large hiatal hernia, aortic atherosclerosis and spinal degenerative changes. Electronically Signed   By: Zetta Bills M.D.   On: 07/16/2021 17:33   DG Chest Port 1 View  Result Date: 08/01/2021 CLINICAL DATA:  Status post bronchoscopy. Biopsy. EXAM: PORTABLE CHEST 1 VIEW COMPARISON:  CT chest without contrast 07/28/2021 FINDINGS: Heart size is normal. Large hiatal hernia is noted. Lung volumes are low with pulmonary vascular congestion. No pneumothorax is present. Small left effusion noted. IMPRESSION: 1. No pneumothorax. 2. Small left pleural effusion. 3. Low lung volumes with pulmonary vascular congestion.  4. Large hiatal hernia. Electronically Signed   By: San Morelle M.D.   On: 08/01/2021 16:22   CT Super D Chest Wo Contrast  Result Date: 07/31/2021 CLINICAL DATA:  Super D chest protocol for preop EMB biopsy. Pulmonary nodules. EXAM: CT CHEST WITHOUT CONTRAST TECHNIQUE: Multidetector CT imaging of the chest was performed using thin slice collimation for electromagnetic bronchoscopy planning purposes, without intravenous contrast. RADIATION DOSE REDUCTION: This exam was performed according to the departmental dose-optimization program which includes automated exposure control, adjustment of the mA and/or kV according to patient size and/or use of iterative reconstruction technique. COMPARISON:  PET-CT 07/14/2021 FINDINGS: Cardiovascular: The heart is normal in size. No pericardial effusion. The aorta is normal in caliber. Minimal atherosclerotic calcification at the aortic arch. Scattered coronary artery calcifications. Mediastinum/Nodes: No mediastinal or hilar mass or lymphadenopathy. Stable large hiatal hernia. Lungs/Pleura: Persistent bilateral pulmonary nodules. The dominant nodules in the left upper lobe and measures 12 x 12 mm. The showed mild hypermetabolism on the recent PET-CT. There is also an 11 x 11 mm lesion in the left lower lobe on image 55/5 not definitely hypermetabolic, recent PET-CT. Upper Abdomen: No significant upper abdominal findings. No obvious hepatic or adrenal gland lesions. No upper abdominal adenopathy. Musculoskeletal: No significant bony findings. IMPRESSION: 1. Persistent bilateral pulmonary nodules. The largest nodule measures 12 x 12 mm left upper lobe. 2. No mediastinal or hilar mass or adenopathy. 3. Stable large hiatal hernia. 4. No significant upper abdominal findings. 5. Aortic atherosclerosis. Aortic Atherosclerosis (ICD10-I70.0). Electronically Signed   By: Marijo Sanes M.D.   On: 07/31/2021 14:08   DG C-ARM BRONCHOSCOPY  Result Date: 08/01/2021 C-ARM  BRONCHOSCOPY: Fluoroscopy was utilized by the requesting physician.  No radiographic interpretation.     Past medical hx Past Medical History:  Diagnosis Date   Anemia    Arthritis    "probably in hands" (07/17/2016)   CKD (chronic kidney disease), stage III (HCC)    Depression    Family history of adverse reaction to anesthesia    "her father had some issues when he had his gallbladder taken out when he was in his late 58s"   GERD (gastroesophageal reflux disease)    Glaucoma    open angle, bilateral   High cholesterol    History of kidney stones    Hypertension    Lumbago with sciatica, right side    Sepsis ([place]) 07/2016   d/t flu   Type II diabetes mellitus ([place])    Vitamin D deficiency      Social History   Tobacco Use   Smoking status: Never   Smokeless tobacco: Never  Vaping Use   Vaping Use: Never used  Substance Use Topics   Alcohol use: Never   Drug use: Never    Ms.Fesler reports that she has never smoked. She has never used smokeless tobacco. She reports that she does not drink alcohol and does not use drugs.  Tobacco Cessation: Never smoker   Past surgical hx, Family hx, Social hx all reviewed.  Current Outpatient Medications on File Prior to Visit  Medication Sig   amLODipine (NORVASC) 5 MG tablet Take 5 mg by mouth daily.   atorvastatin (LIPITOR) 10  MG tablet Take 10 mg by mouth daily.   Blood Glucose Monitoring Suppl (FREESTYLE LITE) DEVI USE UTD   DULoxetine HCl 40 MG CPEP Take 40 mg by mouth daily.   ferrous sulfate 325 (65 FE) MG EC tablet Take 325 mg by mouth daily.   FREESTYLE LITE test strip USE TO CHECK FASTING GLUCOSE IN THE MORNING AND 2 HOURS AFTER DINNER OR LUNCH   gabapentin (NEURONTIN) 300 MG capsule Take 300 mg by mouth every evening.   glipiZIDE (GLUCOTROL XL) 5 MG 24 hr tablet Take 5 mg by mouth daily with breakfast.   Lancets (FREESTYLE) lancets    lansoprazole (PREVACID) 30 MG capsule Take 30 mg by mouth daily.   loratadine  (CLARITIN) 10 MG tablet Take 10 mg by mouth daily as needed for allergies.   losartan (COZAAR) 100 MG tablet Take 100 mg by mouth daily.   magnesium oxide (MAG-OX) 400 (240 Mg) MG tablet Take 1,000 mg by mouth daily. Taking 2.5 tablets starting today 1,000 mg   metFORMIN (GLUCOPHAGE-XR) 500 MG 24 hr tablet Take 500-1,000 mg by mouth See admin instructions. Take 1 tablet ( 500 mg) in the morning and 2 tablets (1000 mg) in the evening   Propylene Glycol (SYSTANE BALANCE) 0.6 % SOLN Place 1 drop into both eyes daily as needed (dry eyes).   Vitamin D, Ergocalciferol, (DRISDOL) 50000 units CAPS capsule Take 50,000 Units by mouth every Friday.   No current facility-administered medications on file prior to visit.     Allergies  Allergen Reactions   Codeine Hypertension   Alprazolam Other (See Comments)    lethargic    Review Of Systems:  Constitutional:   No  weight loss, night sweats,  Fevers, chills, fatigue, or  lassitude.  HEENT:   No headaches,  Difficulty swallowing,  Tooth/dental problems, or  Sore throat,                No sneezing, itching, ear ache, nasal congestion, post nasal drip,   CV:  No chest pain,  Orthopnea, PND, swelling in lower extremities, anasarca, dizziness, palpitations, syncope.   GI  No heartburn, indigestion, abdominal pain, nausea, vomiting, diarrhea, change in bowel habits, loss of appetite, bloody stools.   Resp: No shortness of breath with exertion or at rest.  No excess mucus, no productive cough,  No non-productive cough,  No coughing up of blood.  No change in color of mucus.  No wheezing.  No chest wall deformity  Skin: no rash or lesions.  GU: no dysuria, change in color of urine, no urgency or frequency.  No flank pain, no hematuria   MS:  No joint pain or swelling.  No decreased range of motion.  No back pain.  Psych:  No change in mood or affect. No depression or anxiety.  No memory loss.   Vital Signs BP 118/74 (BP Location: Left Arm,  Patient Position: Sitting, Cuff Size: Normal)    Pulse 67    Temp 98.5 F (36.9 C) (Oral)    Ht 5\' 1"  (1.549 m)    Wt 175 lb (79.4 kg)    SpO2 97%    BMI 33.07 kg/m    Physical Exam:  General- No distress,  A&Ox3, pleasant, anxious ENT: No sinus tenderness, TM clear, pale nasal mucosa, no oral exudate,no post nasal drip, no LAN Cardiac: S1, S2, regular rate and rhythm, no murmur Chest: No wheeze/ rales/ dullness; no accessory muscle use, no nasal flaring, no sternal retractions Abd.: Soft  Non-tender, nondistended, bowel sounds positive,Body mass index is 33.07 kg/m.  Ext: No clubbing cyanosis, edema Neuro:  normal strength, moving all extremities x4, alert and oriented x3, appropriate Skin: No rashes, warm and dry, intact no lesions Psych: normal mood and behavior, anxious   Assessment/Plan Bilateral pulmonary nodules Left lower lobe needle aspiration positive for malignant cells No definitive diagnosis and no additional tissue for testing Never smoker therefore need to evaluate for genetic mutation with further tissue testing/blood evaluation if this has not already been done Plan We will ask Dr. Jacqulynn Cadet to weigh in on the option for CT Guided needle biopsy for more tissue.  If he feels this is a  very difficult case, we can have Dr. Valeta Harms do a bronch for additional tissue.  If you have not heard from [place] by Tuesday, call the office .  Our goal is to get you scheduled for your procedure , and on the way to treatment as soon as possible.  Call us if you need [place] sooner Please contact office for sooner follow up if symptoms do not improve or worsen or seek emergency care  We will refer to medical oncology once we have definitive diagnosis.   I spent 50 minutes dedicated to the care of this patient on the date of this encounter to include pre-visit review of records, face-to-face time with the patient discussing conditions above, post visit ordering of testing, clinical  documentation with the electronic health record, making appropriate referrals as documented, and communicating necessary information to the patient's healthcare team.    Magdalen Spatz, NP 08/11/2021  8:33 PM

## 2021-08-09 NOTE — Telephone Encounter (Signed)
Noted by triage.  

## 2021-08-11 ENCOUNTER — Other Ambulatory Visit: Payer: Self-pay

## 2021-08-11 ENCOUNTER — Ambulatory Visit (INDEPENDENT_AMBULATORY_CARE_PROVIDER_SITE_OTHER): Payer: Medicare Other | Admitting: Acute Care

## 2021-08-11 ENCOUNTER — Encounter: Payer: Self-pay | Admitting: Acute Care

## 2021-08-11 ENCOUNTER — Telehealth: Payer: Self-pay | Admitting: Acute Care

## 2021-08-11 VITALS — BP 118/74 | HR 67 | Temp 98.5°F | Ht 61.0 in | Wt 175.0 lb

## 2021-08-11 DIAGNOSIS — R918 Other nonspecific abnormal finding of lung field: Secondary | ICD-10-CM

## 2021-08-11 DIAGNOSIS — C3432 Malignant neoplasm of lower lobe, left bronchus or lung: Secondary | ICD-10-CM | POA: Diagnosis not present

## 2021-08-11 NOTE — Patient Instructions (Addendum)
It is good to see you today ?We will ask Dr. Jacqulynn Cadet to weigh in on the option for CT Guided needle biopsy for more tissue.  ?If he feels this is a  very difficult case, we can have Dr. Valeta Harms do a bronch for additional tissue.  ?If you have not heard from [place] by Tuesday, call the office .  ?Our goal is to get you scheduled for your procedure , and on the way to treatment as soon as possible.  ?Call us if you need [place] sooner ?Please contact office for sooner follow up if symptoms do not improve or worsen or seek emergency care   ? ?

## 2021-08-11 NOTE — Telephone Encounter (Signed)
Do you want to wait to refer to medical oncology until we have definitive tissue diagnosis already want her for now.  Let me know.  Thanks ?

## 2021-08-14 ENCOUNTER — Telehealth: Payer: Self-pay | Admitting: Acute Care

## 2021-08-14 ENCOUNTER — Other Ambulatory Visit: Payer: Self-pay | Admitting: Acute Care

## 2021-08-14 ENCOUNTER — Encounter (HOSPITAL_COMMUNITY): Payer: Self-pay | Admitting: Radiology

## 2021-08-14 DIAGNOSIS — C3490 Malignant neoplasm of unspecified part of unspecified bronchus or lung: Secondary | ICD-10-CM

## 2021-08-14 NOTE — Progress Notes (Signed)
Orders placed to Medical oncology ?

## 2021-08-14 NOTE — Progress Notes (Signed)
Patient Name  ?Michaela Santana Legal Sex  ?Female DOB  ?1945/07/11 SSN  ?[id number] Address  ?[place] [address] Phone  ?[phone] (Home) *Preferred*  ?[phone] (Mobile)  ? ? RE: CT Biopsy ?Received: Today ?[name], MD  Garth Bigness D ?Cc: Miles Costain L ?Good afternoon Ms. [name].  ? ?Yes, this is correct. OK to proceed w CT Lung Nodule Bx.  ?You may schedule it for me on a day when I am at [facility] @ [place].  ? ?Thanks,  ?JM, MD   ?  ?   ?Previous Messages ?  ?----- Message -----  ?From: Garth Bigness D  ?Sent: 08/14/2021   1:42 PM EST  ?To: [name], MD, Ir Procedure Requests  ?Subject: CT Biopsy                                      ? ?Procedure:  CT Biopsy  ? ?Reason:  Malignant neoplasm of lung, unspecified laterality, unspecified part of lung, Lung nodule needs tissue typing via CT Guided Fine needle biopsy  ? ?Dr. Maryelizabeth Kaufmann has already reviewed the case, and spoken with Dr. Valeta Harms. please direct to him  ? ?History:  CT, NM in computer  ? ?Provider:  Eric Form F  ? ?Provider Contact:  [phone]  ?

## 2021-08-14 NOTE — Telephone Encounter (Signed)
I have called the patient. I explained that the interventional radiologist ( Dr. Maryelizabeth Kaufmann ) has reviewed her imaging and has agreed to do the CT Guided needle biopsy. I explained that she will get a call to get this scheduled. She verbalized understanding. He daughter was with her and is also aware.  ?

## 2021-08-15 ENCOUNTER — Ambulatory Visit: Payer: Medicare Other | Attending: Family Medicine | Admitting: Physical Therapy

## 2021-08-15 ENCOUNTER — Other Ambulatory Visit: Payer: Self-pay

## 2021-08-15 ENCOUNTER — Encounter: Payer: Self-pay | Admitting: Physical Therapy

## 2021-08-15 DIAGNOSIS — M6281 Muscle weakness (generalized): Secondary | ICD-10-CM | POA: Insufficient documentation

## 2021-08-15 DIAGNOSIS — G8929 Other chronic pain: Secondary | ICD-10-CM | POA: Insufficient documentation

## 2021-08-15 DIAGNOSIS — M545 Low back pain, unspecified: Secondary | ICD-10-CM | POA: Diagnosis not present

## 2021-08-15 NOTE — Therapy (Signed)
OUTPATIENT PHYSICAL THERAPY THORACOLUMBAR EVALUATION   Patient Name: Michaela Santana MRN: [id number] DOB:12/01/1945, 76 y.o., female Today's Date: 08/15/2021   PT End of Session - 08/15/21 1149     Visit Number 1    Date for PT Re-Evaluation 10/10/21    Authorization Type Medicare Part A/B, [employer]    Progress Note Due on Visit 10    PT Start Time 1150    PT Stop Time 4536    PT Time Calculation (min) 45 min    Activity Tolerance Patient tolerated treatment well    Behavior During Therapy WFL for tasks assessed/performed             Past Medical History:  Diagnosis Date   Anemia    Arthritis    "probably in hands" (07/17/2016)   CKD (chronic kidney disease), stage III ([place])    Depression    Family history of adverse reaction to anesthesia    "her father had some issues when he had his gallbladder taken out when he was in his late 5s"   GERD (gastroesophageal reflux disease)    Glaucoma    open angle, bilateral   High cholesterol    History of kidney stones    Hypertension    Lumbago with sciatica, right side    Sepsis ([place]) 07/2016   d/t flu   Type II diabetes mellitus ([place])    Vitamin D deficiency    Past Surgical History:  Procedure Laterality Date   BREAST BIOPSY  1990   "? side; it wasn't anything"   BRONCHIAL BIOPSY  08/01/2021   Procedure: BRONCHIAL BIOPSIES;  Surgeon: Garner Nash, DO;  Location: [facility];  Service: Pulmonary;;   BRONCHIAL NEEDLE ASPIRATION BIOPSY  08/01/2021   Procedure: BRONCHIAL NEEDLE ASPIRATION BIOPSIES;  Surgeon: Garner Nash, DO;  Location: [facility];  Service: Pulmonary;;   CATARACT EXTRACTION, BILATERAL  2021   CHOLECYSTECTOMY OPEN  1971   TUBAL LIGATION  1978   VIDEO BRONCHOSCOPY WITH RADIAL ENDOBRONCHIAL ULTRASOUND  08/01/2021   Procedure: RADIAL ENDOBRONCHIAL ULTRASOUND;  Surgeon: Garner Nash, DO;  Location: [facility];  Service: Pulmonary;;   Patient Active Problem List   Diagnosis Date Noted    Multiple pulmonary nodules 07/19/2021   Intractable nausea and vomiting 06/09/2021   Spondylosis without myelopathy or radiculopathy, lumbar region 10/17/2016   CHF (congestive heart failure) ([place]) 07/17/2016   Sepsis ([place]) 07/17/2016   Influenza 07/17/2016   Congestive heart failure ([place])    Hypotension    Urinary tract infection with hematuria     PCP: [name], MD  REFERRING PROVIDER: [name], MD   REFERRING DIAG:  M54.41 (ICD-10-CM) - Lumbago with sciatica, right side  R53.81 (ICD-10-CM) - Other malaise   THERAPY DIAG:  Chronic left-sided low back pain, unspecified whether sciatica present  Muscle weakness (generalized)  ONSET DATE: 2 years ago  SUBJECTIVE:  SUBJECTIVE STATEMENT: Pt referred to OPPT for LBP with sciatica in bil anterior thighs, Lt>Rt.  Overall Pt feels weak and lacking energy which may be linked to recent findings of metastatic cells in bil pulmonary nodules - further work up in the next few weeks.  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  Further work up scheduled to determine treatment.   PMH: hypertension, diabetes type 2, vitamin D deficiency, chronic kidney disease stage III Social history: Pt lost husband 4 years ago  PAIN:  Are you having pain? No NPRS scale: 0-5/10 Pain location: midline lumbar and spreads Lt>Rt Pain orientation: Posterior lumbar, spreads to anterior thighs Lt>Rt PAIN TYPE: aching and dull Pain description: intermittent  Aggravating factors: use of back Relieving factors: heating pad  PRECAUTIONS: Other: current work up for lung metastatic cells bil  WEIGHT BEARING RESTRICTIONS No  FALLS:  Has patient fallen in last 6 months? No,  Number of falls: 0  LIVING ENVIRONMENT: Lives with: lives alone Lives in: House/apartment Stairs: No;  Has following equipment at home: Grab bars  OCCUPATION: retired  PLOF: Independent  PATIENT GOALS reduce pain and allow for improved walking ability, get stronger, walk for exercise, be able to climb steps more easily at daughter's house   OBJECTIVE:   DIAGNOSTIC FINDINGS:  From chart review: bilateral pulmonary nodules worrisome for metastasis. She underwent biopsy 08/01/2021 and was found to have malignant cells per the fine needle biopsy. Stage IV disease.      COGNITION:  Overall cognitive status: Within functional limits for tasks assessed         MUSCLE LENGTH: Hamstrings: Right 65 deg; Left 45 deg Quad and hip flexor end range restrictions  POSTURE:  WFL    PALPATION: Bil SI joints, Lt glut med/min, bil lumbar paraspinals  LUMBARAROM/PROM  A/PROM A/PROM  08/15/2021  Flexion Slow but fingers to toes  Extension 2 deg  Right lateral flexion full  Left lateral flexion full  Right rotation Limited 25%  Left rotation Limited 25%   (Blank rows = not tested)  LE AROM/PROM:  A/PROM Right 08/15/2021 Left 08/15/2021  Hip flexion  45 (hamstring limits)  Hip extension    Hip abduction    Hip adduction    Hip internal rotation 5 5  Hip external rotation full 50  Knee flexion    Knee extension    Ankle dorsiflexion    Ankle plantarflexion    Ankle inversion    Ankle eversion     (Blank rows = not tested)  LE MMT:  Hips 4-/5 bil except hip abd 3+/5 bil knee extension 4/5 bil  knee flexion 3+/5 Ankle DF 5/5 bil    FUNCTIONAL TESTS:  Transfers: uses UE assist to rise from chair SLS: unable to balance Rt/Lt  Squat: able to squat to reach floor Spinal mobility: limited lower lumbar facets 5x sit to stand: 17 sec with UE assistance TUG: 12 sec  GAIT:  Assistive device utilized: None Level of assistance: Complete Independence Comments:  WFL    TODAY'S TREATMENT  Seated hamstring stretch Seated lumbar flexion stretch Sit to stand   PATIENT EDUCATION:  Education details: HEP and discussion of eval findings Person educated: Patient Education method: Explanation, Demonstration, and Handouts Education comprehension: verbalized understanding and returned demonstration   HOME EXERCISE PROGRAM: Access Code: [email or web address] URL: [email or web address] Date: 08/15/2021 Prepared by: Venetia Night Nohlan Burdin  Exercises Seated Hamstring Stretch - 1 x daily - 7 x  weekly - 1 sets - 2 reps - 20 hold Seated Flexion Stretch - 1 x daily - 7 x weekly - 1 sets - 3 reps - 10 hold Sit to Stand with Hands on Knees - 3 x daily - 7 x weekly - 1 sets - 5 reps   ASSESSMENT:  CLINICAL IMPRESSION: Patient is a 76 y.o. female who was seen today for physical therapy evaluation and treatment for LBP with Rt radicular symptoms and general deconditioning.  She presents with postural and LE weakness, limited Lt hip ROM, limited lumbar extension, limited LE flexibility of hamstrings and proximal hip flexors Lt>Rt, and stiffness in lumbar spine and soft tissues.  She has tenderness along Lt lateral hip and bil lumbar spine.  She will have another needle biopsy of bil lung nodules (08/23/21) already found to have metastatic cells.  Pt is overall deconditioned and weak.  She lives alone and will benefit from skilled PT to improve endurance, strength, flexibility and functional mobility to improve safety, activity tolerance and QOL.   OBJECTIVE IMPAIRMENTS decreased activity tolerance, decreased balance, decreased ROM, decreased strength, hypomobility, impaired flexibility, and pain.   ACTIVITY LIMITATIONS cleaning, community activity, meal prep, laundry, and shopping.   PERSONAL FACTORS Fitness, Time since onset of injury/illness/exacerbation, and 1 comorbidity: ongoing work up for metastatic cells in bil pulmonary nodules  are also affecting patient's  functional outcome.    REHAB POTENTIAL: Good  CLINICAL DECISION MAKING: Stable/uncomplicated  EVALUATION COMPLEXITY: Low   GOALS: Goals reviewed with patient? Yes  SHORT TERM GOALS:  Ind with initial HEP Baseline:  Target date: 09/05/2021 Goal status: INITIAL  2.  Pt will reduce 5x sit to stand to </= 14 sec without need for UE assist Baseline:  Target date: 09/12/2021 Goal status: INITIAL  3.  Pt will achieve Lt hamstring length to at least 65 deg Baseline: 45 Target date: 10/10/2021 Goal status: INITIAL  4.  Pt will be able to go up/down 4x6" stairs using railing x 3 rounds Baseline:  Target date: 09/12/2021 Goal status: INITIAL  5.  Pt will report at least 20% reduction in LBP with light household tasks. Baseline:  Target date: 09/12/2021 Goal status: INITIAL    LONG TERM GOALS:  Pt will be able to participate in 6' walk test covering at least 500' for improved gait endurance. Baseline:  Target date: 10/10/2021 Goal status: INITIAL  2.  Pt will achieve LE strength of at least 4/5 in all muscle groups for improved gait, stairs, and functional squatting tasks. Baseline:  Target date: 10/10/2021 Goal status: INITIAL  3.  Pt will report at least 60% improvement in LBP with standing household tasks. Baseline:  Target date: 10/10/2021 Goal status: INITIAL    PLAN: PT FREQUENCY: 1-2x/week  PT DURATION: 8 weeks  PLANNED INTERVENTIONS: Therapeutic exercises, Therapeutic activity, Neuromuscular re-education, Balance training, Gait training, Patient/Family education, Joint mobilization, Aquatic Therapy, Dry Needling, Spinal mobilization, and Manual therapy  PLAN FOR NEXT SESSION: hip flexor stretch on step, review HEP, progress spine ROM (try cat/cow) and LE and postural strength as tol, NuStep, work towards step ups for stair goal and gait for 6' walk endurance goal   [employer], PT 08/15/21 1:49 PM

## 2021-08-17 ENCOUNTER — Other Ambulatory Visit: Payer: Self-pay

## 2021-08-17 DIAGNOSIS — R918 Other nonspecific abnormal finding of lung field: Secondary | ICD-10-CM

## 2021-08-18 ENCOUNTER — Other Ambulatory Visit: Payer: Self-pay

## 2021-08-18 ENCOUNTER — Inpatient Hospital Stay (HOSPITAL_BASED_OUTPATIENT_CLINIC_OR_DEPARTMENT_OTHER): Payer: Medicare Other | Admitting: Internal Medicine

## 2021-08-18 ENCOUNTER — Telehealth: Payer: Self-pay | Admitting: Internal Medicine

## 2021-08-18 ENCOUNTER — Inpatient Hospital Stay: Payer: Medicare Other | Attending: Internal Medicine

## 2021-08-18 VITALS — BP 172/90 | HR 87 | Temp 97.7°F | Resp 18 | Ht 61.0 in | Wt 176.1 lb

## 2021-08-18 DIAGNOSIS — C3490 Malignant neoplasm of unspecified part of unspecified bronchus or lung: Secondary | ICD-10-CM

## 2021-08-18 DIAGNOSIS — N183 Chronic kidney disease, stage 3 unspecified: Secondary | ICD-10-CM | POA: Diagnosis not present

## 2021-08-18 DIAGNOSIS — Z7984 Long term (current) use of oral hypoglycemic drugs: Secondary | ICD-10-CM | POA: Diagnosis not present

## 2021-08-18 DIAGNOSIS — C3432 Malignant neoplasm of lower lobe, left bronchus or lung: Secondary | ICD-10-CM

## 2021-08-18 DIAGNOSIS — E1122 Type 2 diabetes mellitus with diabetic chronic kidney disease: Secondary | ICD-10-CM | POA: Insufficient documentation

## 2021-08-18 DIAGNOSIS — R918 Other nonspecific abnormal finding of lung field: Secondary | ICD-10-CM

## 2021-08-18 DIAGNOSIS — Z79899 Other long term (current) drug therapy: Secondary | ICD-10-CM | POA: Insufficient documentation

## 2021-08-18 DIAGNOSIS — I129 Hypertensive chronic kidney disease with stage 1 through stage 4 chronic kidney disease, or unspecified chronic kidney disease: Secondary | ICD-10-CM

## 2021-08-18 DIAGNOSIS — C7A090 Malignant carcinoid tumor of the bronchus and lung: Secondary | ICD-10-CM | POA: Insufficient documentation

## 2021-08-18 DIAGNOSIS — C3492 Malignant neoplasm of unspecified part of left bronchus or lung: Secondary | ICD-10-CM

## 2021-08-18 LAB — CBC WITH DIFFERENTIAL (CANCER CENTER ONLY)
Abs Immature Granulocytes: 0.02 10*3/uL (ref 0.00–0.07)
Basophils Absolute: 0.1 10*3/uL (ref 0.0–0.1)
Basophils Relative: 1 %
Eosinophils Absolute: 0.2 10*3/uL (ref 0.0–0.5)
Eosinophils Relative: 3 %
HCT: 36.9 % (ref 36.0–46.0)
Hemoglobin: 11.9 g/dL — ABNORMAL LOW (ref 12.0–15.0)
Immature Granulocytes: 0 %
Lymphocytes Relative: 23 %
Lymphs Abs: 1.6 10*3/uL (ref 0.7–4.0)
MCH: 29 pg (ref 26.0–34.0)
MCHC: 32.2 g/dL (ref 30.0–36.0)
MCV: 90 fL (ref 80.0–100.0)
Monocytes Absolute: 0.5 10*3/uL (ref 0.1–1.0)
Monocytes Relative: 7 %
Neutro Abs: 4.4 10*3/uL (ref 1.7–7.7)
Neutrophils Relative %: 66 %
Platelet Count: 283 10*3/uL (ref 150–400)
RBC: 4.1 MIL/uL (ref 3.87–5.11)
RDW: 14.6 % (ref 11.5–15.5)
WBC Count: 6.8 10*3/uL (ref 4.0–10.5)
nRBC: 0 % (ref 0.0–0.2)

## 2021-08-18 LAB — CMP (CANCER CENTER ONLY)
ALT: 16 U/L (ref 0–44)
AST: 14 U/L — ABNORMAL LOW (ref 15–41)
Albumin: 4 g/dL (ref 3.5–5.0)
Alkaline Phosphatase: 73 U/L (ref 38–126)
Anion gap: 9 (ref 5–15)
BUN: 18 mg/dL (ref 8–23)
CO2: 25 mmol/L (ref 22–32)
Calcium: 9.8 mg/dL (ref 8.9–10.3)
Chloride: 106 mmol/L (ref 98–111)
Creatinine: 0.97 mg/dL (ref 0.44–1.00)
GFR, Estimated: >60 mL/min (ref >60)
Glucose, Bld: 161 mg/dL — ABNORMAL HIGH (ref 70–99)
Potassium: 4.3 mmol/L (ref 3.5–5.1)
Sodium: 140 mmol/L (ref 135–145)
Total Bilirubin: 0.5 mg/dL (ref 0.3–1.2)
Total Protein: 6.3 g/dL — ABNORMAL LOW (ref 6.5–8.1)

## 2021-08-18 NOTE — Telephone Encounter (Signed)
.  Called patient to schedule appointment per 3/10 los, patient is aware of date and time.   ?

## 2021-08-18 NOTE — Progress Notes (Signed)
[place] [phone]   Fax:([phone]) [phone]  CONSULT NOTE  REFERRING PHYSICIAN: Dr. Leory Plowman Icard  REASON FOR CONSULTATION:  76 years old white female recently diagnosed with lung cancer  HPI Michaela Santana is a 76 y.o. female with past medical history significant for hypertension, diabetes mellitus, GERD, chronic kidney disease, depression, osteoarthritis, vitamin D deficiency, right-sided sciatica.  The patient is a never smoker.  She was seen at the emergency department on June 09, 2021 complaining of nausea and vomiting as well as hypomagnesemia with magnesium level of 0.7 when seen by her primary care physician.  During her evaluation she had CT scan of the abdomen and pelvis and that showed gastroenteritis without evidence of bowel obstruction or inflammation.  Incidentally there were numerous bilateral pulmonary nodules worrisome for metastasis.  The largest on the left lower lobe measuring 1.1 cm and the largest on the right is in the lower lobe measuring 0.66 cm.  The patient was referred to Dr. Valeta Harms and on July 14, 2021 she had a PET scan and it showed diffuse randomly distributed innumerable pulmonary nodules seen in the right and left chest with basilar predominance.  The dominant nodule in the left upper lobe measured 1.2 cm and has SUV of 3.3.  Left lower lobe pulmonary nodule measures 1.0 cm and just above the left hemidiaphragm with maximum SUV of 1.7.  There were multiple nodules less than 1.0 cm some in the range of 2-3 mm scattered randomly throughout the reminder of the chest again with mid and lower chest predominance.  This pattern remains highly concerning for metastatic process.  There was no signs of adenopathy in the chest and no signs of hypermetabolic disease elsewhere in the neck, chest, abdomen or pelvis.  On August 01, 2021 the patient underwent video bronchoscopy with robotic assistant bronchoscopic navigation under the care of Dr.  Valeta Harms.  The final cytology from the left lower lobe fine-needle aspiration ([id number]) showed malignant cells present.  There was insufficient material for ancillary studies. The patient is up-to-date with her mammogram that was performed in November 2022.  She also had upper endoscopy and colonoscopy in January 2023 at [facility] gastroenterology that were unremarkable too according to her and her daughter. Dr. Valeta Harms kindly referred the patient to me today for evaluation and recommendation regarding treatment of her condition. When seen today the patient is feeling fine but very nervous about her recent diagnosis.  She has shortness of breath with exertion with mild cough productive of clear sputum but no chest pain or hemoptysis.  She has occasional headache and some visual changes secondary to cataract.  She has no current nausea, vomiting, diarrhea or constipation.  She has no weight loss or night sweats.  She has no fever or chills. Family history significant for mother with dementia and diabetes mellitus.  Father had stroke.  Sister had diabetes and hypertension. The patient is a widow and has 2 daughters.  She was accompanied today by her daughter Michaela Santana and her daughter Michaela Santana was available by phone during the visit.  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  She is currently retired.  She has no history of smoking, alcohol or drug abuse.  HPI  Past Medical History:  Diagnosis Date   Anemia    Arthritis    "probably in hands" (07/17/2016)   CKD (chronic kidney disease), stage III (HCC)    Depression    Family history  of adverse reaction to anesthesia    "her father had some issues when he had his gallbladder taken out when he was in his late 57s"   GERD (gastroesophageal reflux disease)    Glaucoma    open angle, bilateral   High cholesterol    History of kidney stones    Hypertension    Lumbago with sciatica, right side    Sepsis ([place]) 07/2016    d/t flu   Type II diabetes mellitus ([place])    Vitamin D deficiency     Past Surgical History:  Procedure Laterality Date   BREAST BIOPSY  1990   "? side; it wasn't anything"   BRONCHIAL BIOPSY  08/01/2021   Procedure: BRONCHIAL BIOPSIES;  Surgeon: Garner Nash, DO;  Location: [facility];  Service: Pulmonary;;   BRONCHIAL NEEDLE ASPIRATION BIOPSY  08/01/2021   Procedure: BRONCHIAL NEEDLE ASPIRATION BIOPSIES;  Surgeon: Garner Nash, DO;  Location: [facility];  Service: Pulmonary;;   CATARACT EXTRACTION, BILATERAL  2021   CHOLECYSTECTOMY OPEN  1971   TUBAL LIGATION  1978   VIDEO BRONCHOSCOPY WITH RADIAL ENDOBRONCHIAL ULTRASOUND  08/01/2021   Procedure: RADIAL ENDOBRONCHIAL ULTRASOUND;  Surgeon: Garner Nash, DO;  Location: [facility] ENDOSCOPY;  Service: Pulmonary;;    Family History  Problem Relation Age of Onset   Dementia Mother    Diabetes Mother    Stroke Father    Hypertension Father    Diabetes Sister    Hypertension Sister    Heart disease Paternal Grandfather    Breast cancer Neg Hx     Social History Social History   Tobacco Use   Smoking status: Never   Smokeless tobacco: Never  Vaping Use   Vaping Use: Never used  Substance Use Topics   Alcohol use: Never   Drug use: Never    Allergies  Allergen Reactions   Codeine Hypertension   Alprazolam Other (See Comments)    lethargic    Current Outpatient Medications  Medication Sig Dispense Refill   amLODipine (NORVASC) 5 MG tablet Take 5 mg by mouth daily.     atorvastatin (LIPITOR) 10 MG tablet Take 10 mg by mouth daily.     Blood Glucose Monitoring Suppl (FREESTYLE LITE) DEVI USE UTD  0   DULoxetine HCl 40 MG CPEP Take 40 mg by mouth daily.     ferrous sulfate 325 (65 FE) MG EC tablet Take 325 mg by mouth daily.     FREESTYLE LITE test strip USE TO CHECK FASTING GLUCOSE IN THE MORNING AND 2 HOURS AFTER DINNER OR LUNCH  3   gabapentin (NEURONTIN) 300 MG capsule Take 300 mg by mouth every evening.      glipiZIDE (GLUCOTROL XL) 5 MG 24 hr tablet Take 5 mg by mouth daily with breakfast.     Lancets (FREESTYLE) lancets      lansoprazole (PREVACID) 30 MG capsule Take 30 mg by mouth daily.     loratadine (CLARITIN) 10 MG tablet Take 10 mg by mouth daily as needed for allergies.     losartan (COZAAR) 100 MG tablet Take 100 mg by mouth daily.     magnesium oxide (MAG-OX) 400 (240 Mg) MG tablet Take 1,000 mg by mouth daily. Taking 2.5 tablets starting today 1,000 mg     metFORMIN (GLUCOPHAGE-XR) 500 MG 24 hr tablet Take 500-1,000 mg by mouth See admin instructions. Take 1 tablet ( 500 mg) in the morning and 2 tablets (1000 mg) in the evening  Propylene Glycol (SYSTANE BALANCE) 0.6 % SOLN Place 1 drop into both eyes daily as needed (dry eyes).     Vitamin D, Ergocalciferol, (DRISDOL) 50000 units CAPS capsule Take 50,000 Units by mouth every Friday.     No current facility-administered medications for this visit.    Review of Systems  Constitutional: negative Eyes: negative Ears, nose, mouth, throat, and face: negative Respiratory: positive for cough and dyspnea on exertion Cardiovascular: negative Gastrointestinal: negative Genitourinary:negative Integument/breast: negative Hematologic/lymphatic: negative Musculoskeletal:positive for arthralgias Neurological: positive for headaches Behavioral/Psych: negative Endocrine: negative Allergic/Immunologic: negative  Physical Exam  PZW:CHENI, healthy, no distress, well nourished, well developed, and anxious SKIN: skin color, texture, turgor are normal, no rashes or significant lesions HEAD: Normocephalic, No masses, lesions, tenderness or abnormalities EYES: normal, PERRLA, Conjunctiva are pink and non-injected EARS: External ears normal, Canals clear OROPHARYNX:no exudate, no erythema, and lips, buccal mucosa, and tongue normal  NECK: supple, no adenopathy, no JVD LYMPH:  no palpable lymphadenopathy, no hepatosplenomegaly BREAST:not  examined LUNGS: clear to auscultation , and palpation HEART: regular rate & rhythm, no murmurs, and no gallops ABDOMEN:abdomen soft, non-tender, normal bowel sounds, and no masses or organomegaly BACK: Back symmetric, no curvature., No CVA tenderness EXTREMITIES:no joint deformities, effusion, or inflammation, no edema  NEURO: alert & oriented x 3 with fluent speech, no focal motor/sensory deficits  PERFORMANCE STATUS: ECOG 1  LABORATORY DATA: Lab Results  Component Value Date   WBC 5.9 08/01/2021   HGB 11.5 (L) 08/01/2021   HCT 36.2 08/01/2021   MCV 91.2 08/01/2021   PLT 262 08/01/2021      Chemistry      Component Value Date/Time   NA 137 08/01/2021 1326   K 4.0 08/01/2021 1326   CL 104 08/01/2021 1326   CO2 21 (L) 08/01/2021 1326   BUN 19 08/01/2021 1326   CREATININE 0.95 08/01/2021 1326      Component Value Date/Time   CALCIUM 9.2 08/01/2021 1326   ALKPHOS 44 06/08/2021 2033   AST 17 06/08/2021 2033   ALT 17 06/08/2021 2033   BILITOT 0.7 06/08/2021 2033       RADIOGRAPHIC STUDIES: DG Chest Port 1 View  Result Date: 08/01/2021 CLINICAL DATA:  Status post bronchoscopy. Biopsy. EXAM: PORTABLE CHEST 1 VIEW COMPARISON:  CT chest without contrast 07/28/2021 FINDINGS: Heart size is normal. Large hiatal hernia is noted. Lung volumes are low with pulmonary vascular congestion. No pneumothorax is present. Small left effusion noted. IMPRESSION: 1. No pneumothorax. 2. Small left pleural effusion. 3. Low lung volumes with pulmonary vascular congestion. 4. Large hiatal hernia. Electronically Signed   By: San Morelle M.D.   On: 08/01/2021 16:22   CT Super D Chest Wo Contrast  Result Date: 07/31/2021 CLINICAL DATA:  Super D chest protocol for preop EMB biopsy. Pulmonary nodules. EXAM: CT CHEST WITHOUT CONTRAST TECHNIQUE: Multidetector CT imaging of the chest was performed using thin slice collimation for electromagnetic bronchoscopy planning purposes, without intravenous  contrast. RADIATION DOSE REDUCTION: This exam was performed according to the departmental dose-optimization program which includes automated exposure control, adjustment of the mA and/or kV according to patient size and/or use of iterative reconstruction technique. COMPARISON:  PET-CT 07/14/2021 FINDINGS: Cardiovascular: The heart is normal in size. No pericardial effusion. The aorta is normal in caliber. Minimal atherosclerotic calcification at the aortic arch. Scattered coronary artery calcifications. Mediastinum/Nodes: No mediastinal or hilar mass or lymphadenopathy. Stable large hiatal hernia. Lungs/Pleura: Persistent bilateral pulmonary nodules. The dominant nodules in the left upper lobe and  measures 12 x 12 mm. The showed mild hypermetabolism on the recent PET-CT. There is also an 11 x 11 mm lesion in the left lower lobe on image 55/5 not definitely hypermetabolic, recent PET-CT. Upper Abdomen: No significant upper abdominal findings. No obvious hepatic or adrenal gland lesions. No upper abdominal adenopathy. Musculoskeletal: No significant bony findings. IMPRESSION: 1. Persistent bilateral pulmonary nodules. The largest nodule measures 12 x 12 mm left upper lobe. 2. No mediastinal or hilar mass or adenopathy. 3. Stable large hiatal hernia. 4. No significant upper abdominal findings. 5. Aortic atherosclerosis. Aortic Atherosclerosis (ICD10-I70.0). Electronically Signed   By: Marijo Sanes M.D.   On: 07/31/2021 14:08   DG C-ARM BRONCHOSCOPY  Result Date: 08/01/2021 C-ARM BRONCHOSCOPY: Fluoroscopy was utilized by the requesting physician.  No radiographic interpretation.    ASSESSMENT: This is a very pleasant 76 years old never smoker white female with stage IV (T1b, N0, M1 a) lung cancer pending confirmation of the tissue diagnosis and other staging work-up.  The patient presented with innumerable bilateral pulmonary nodules highly suspicious for either multifocal adenocarcinoma in the patient with  interval smoking history or low-grade neuroendocrine carcinoma (carcinoid tumor).   PLAN: I had a lengthy discussion with the patient and her daughters today about her current condition and potential treatment options. I personally and independently reviewed the scan images and discussed the result and showed the images to the patient and her daughters. I recommended for the patient to complete the staging work-up by ordering MRI of the brain to rule out brain metastasis. The patient is also scheduled for CT-guided core biopsy of one of the prominent lung nodule probably on the left side for confirmation of the tissue diagnosis and also to have enough material for molecular studies if the final pathology is consistent with non-small cell lung cancer. If the final pathology is consistent with non-small cell lung cancer, adenocarcinoma we will send the tissue block to [facility] for molecular studies and PD-L1 expression. I will see the patient back for follow-up visit in around 3 weeks for evaluation and more detailed discussion of her treatment options based on the final pathology, molecular studies and MRI of the brain. She is currently asymptomatic and she can wait a few weeks until confirmation of her diagnosis. The patient was advised to call immediately if she has any other concerning symptoms in the interval. The patient voices understanding of current disease status and treatment options and is in agreement with the current care plan.  All questions were answered. The patient knows to call the clinic with any problems, questions or concerns. We can certainly see the patient much sooner if necessary.  Thank you so much for allowing me to participate in the care of Michaela Santana. I will continue to follow up the patient with you and assist in her care.  The total time spent in the appointment was 60 minutes.  Disclaimer: This note was dictated with voice recognition software. Similar  sounding words can inadvertently be transcribed and may not be corrected upon review.   Eilleen Kempf August 18, 2021, 9:00 AM

## 2021-08-21 ENCOUNTER — Encounter: Payer: Self-pay | Admitting: *Deleted

## 2021-08-21 NOTE — Progress Notes (Signed)
Oncology Nurse Navigator Documentation ? ?Oncology Nurse Navigator Flowsheets 08/21/2021  ?Abnormal Finding Date 06/09/2021  ?Confirmed Diagnosis Date 08/23/2021  ?Diagnosis Status Additional Work Up  ?Navigator Follow Up Date: 08/24/2021  ?Navigator Follow Up Reason: Pathology  ?Navigator Location [facility]  ?Navigator Encounter Type Appt/Treatment Plan Review  ?Patient Visit Type Other  ?Treatment Phase Abnormal Scans  ?Barriers/Navigation Needs Coordination of Care/Ms. Mceachron is a new patient of Dr. Worthy Flank.  I followed up on her plan of care. She will be getting a bx and MRI brain this week.  Will follow up on pathology.   ?Interventions Coordination of Care  ?Acuity Level 2-Minimal Needs (1-2 Barriers Identified)  ?Coordination of Care Other  ?Time Spent with Patient 30  ?  ?

## 2021-08-22 ENCOUNTER — Other Ambulatory Visit: Payer: Self-pay | Admitting: Radiology

## 2021-08-23 ENCOUNTER — Ambulatory Visit (HOSPITAL_COMMUNITY)
Admission: RE | Admit: 2021-08-23 | Discharge: 2021-08-23 | Disposition: A | Discharge status: Home / Self Care | Payer: Medicare Other | Source: Ambulatory Visit | Attending: Acute Care | Admitting: Acute Care

## 2021-08-23 ENCOUNTER — Other Ambulatory Visit: Payer: Self-pay

## 2021-08-23 DIAGNOSIS — N189 Chronic kidney disease, unspecified: Secondary | ICD-10-CM | POA: Insufficient documentation

## 2021-08-23 DIAGNOSIS — C7A090 Malignant carcinoid tumor of the bronchus and lung: Secondary | ICD-10-CM | POA: Diagnosis not present

## 2021-08-23 DIAGNOSIS — C3492 Malignant neoplasm of unspecified part of left bronchus or lung: Secondary | ICD-10-CM | POA: Diagnosis not present

## 2021-08-23 DIAGNOSIS — C3490 Malignant neoplasm of unspecified part of unspecified bronchus or lung: Secondary | ICD-10-CM

## 2021-08-23 DIAGNOSIS — I129 Hypertensive chronic kidney disease with stage 1 through stage 4 chronic kidney disease, or unspecified chronic kidney disease: Secondary | ICD-10-CM | POA: Insufficient documentation

## 2021-08-23 DIAGNOSIS — C7B00 Secondary carcinoid tumors, unspecified site: Secondary | ICD-10-CM | POA: Insufficient documentation

## 2021-08-23 DIAGNOSIS — F32A Depression, unspecified: Secondary | ICD-10-CM | POA: Diagnosis not present

## 2021-08-23 DIAGNOSIS — I517 Cardiomegaly: Secondary | ICD-10-CM | POA: Diagnosis not present

## 2021-08-23 DIAGNOSIS — Z9889 Other specified postprocedural states: Secondary | ICD-10-CM

## 2021-08-23 DIAGNOSIS — E1122 Type 2 diabetes mellitus with diabetic chronic kidney disease: Secondary | ICD-10-CM | POA: Diagnosis not present

## 2021-08-23 DIAGNOSIS — R918 Other nonspecific abnormal finding of lung field: Secondary | ICD-10-CM | POA: Diagnosis present

## 2021-08-23 LAB — CBC
HCT: 39.3 % (ref 36.0–46.0)
Hemoglobin: 12.5 g/dL (ref 12.0–15.0)
MCH: 29.1 pg (ref 26.0–34.0)
MCHC: 31.8 g/dL (ref 30.0–36.0)
MCV: 91.6 fL (ref 80.0–100.0)
Platelets: 308 10*3/uL (ref 150–400)
RBC: 4.29 MIL/uL (ref 3.87–5.11)
RDW: 14.5 % (ref 11.5–15.5)
WBC: 7.9 10*3/uL (ref 4.0–10.5)
nRBC: 0 % (ref 0.0–0.2)

## 2021-08-23 LAB — GLUCOSE, CAPILLARY
Glucose-Capillary: 164 mg/dL — ABNORMAL HIGH (ref 70–99)
Glucose-Capillary: 145 mg/dL — ABNORMAL HIGH (ref 70–99)

## 2021-08-23 LAB — PROTIME-INR
INR: 0.9 (ref 0.8–1.2)
Prothrombin Time: 12.2 seconds (ref 11.4–15.2)

## 2021-08-23 IMAGING — DX DG CHEST 1V PORT
1 series · 1 of 1 positions shown · non-contrast
Comparison: [DATE]

CLINICAL DATA: Post left-sided biopsy

EXAM:
PORTABLE CHEST 1 VIEW

[chest]
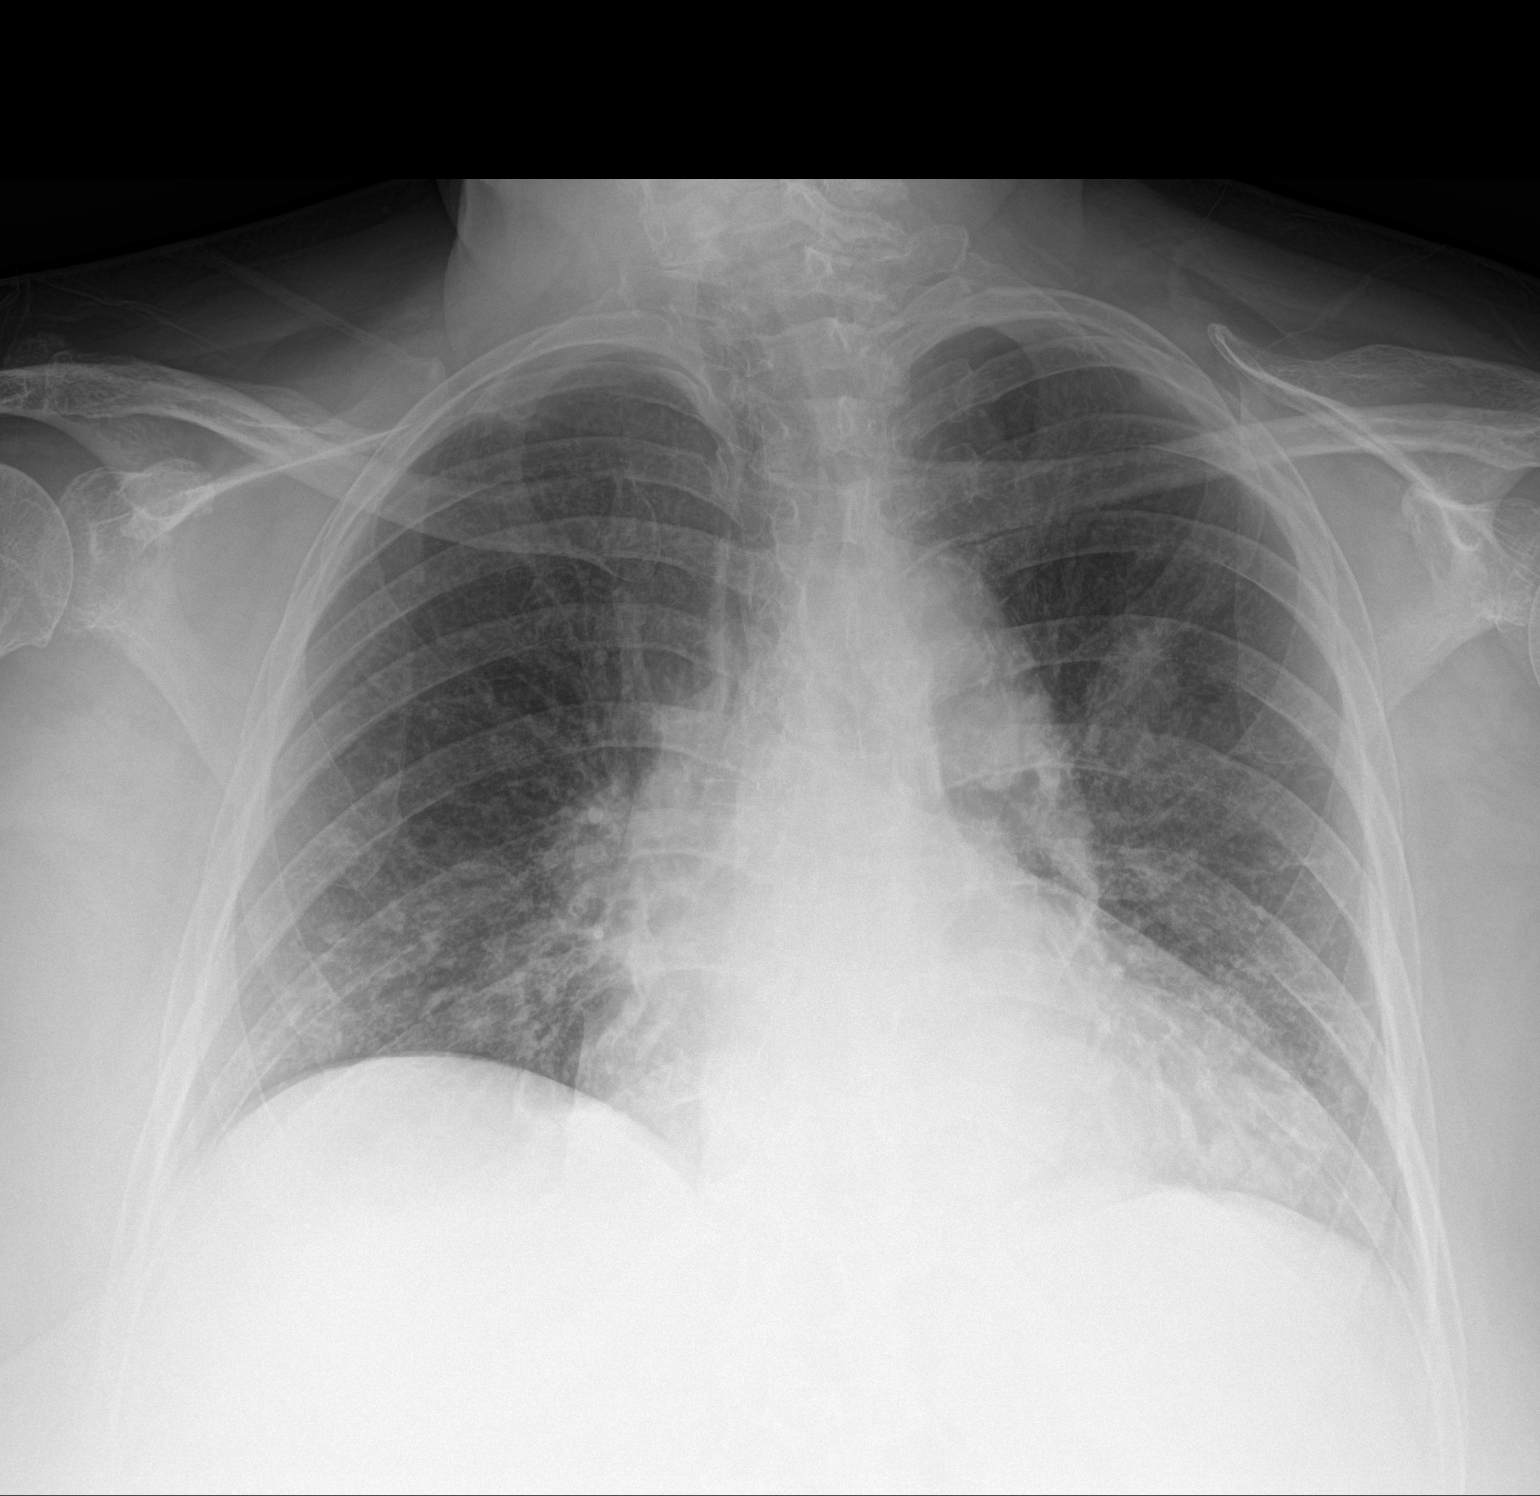

[1 of 1 positions shown; findings below may reference images not displayed]

FINDINGS: Cardiomegaly. Small focus of post biopsy change in the left midlung.
No pneumothorax. The visualized skeletal structures are
unremarkable.
IMPRESSION: Small focus of post biopsy change in the left midlung. No
pneumothorax.

## 2021-08-23 IMAGING — CT CT BIOPY CORE LUNG/MEDIASTINUM
1 of 3 series · 9 of 32 positions shown, 15 images · non-contrast
Comparison: None.

INDICATION: Lung cancer with multifocal pulmonary nodules.

[Series 4: i-spiral 5.0 b40f · axial · 0.86mm/px · z∈[+985,+1128]mm · 9 of 53 slices shown, 15 images]
[im 6/53  soft-tissue]
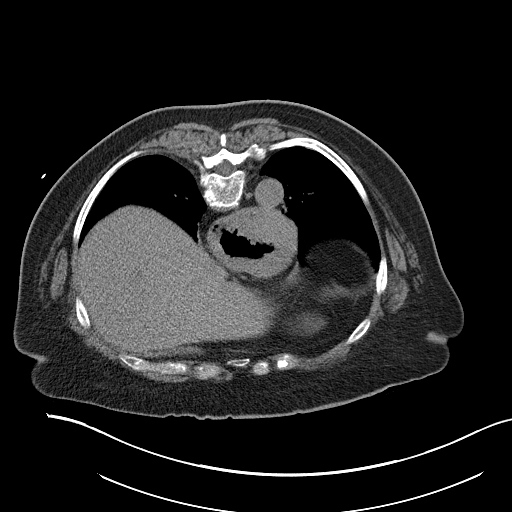
[im 6/53  bone]
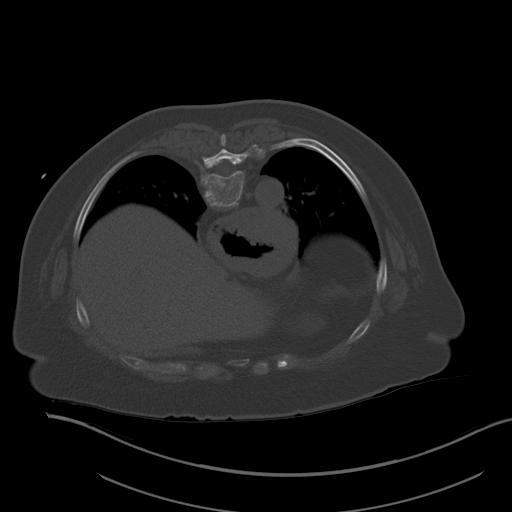
[im 11/53  soft-tissue]
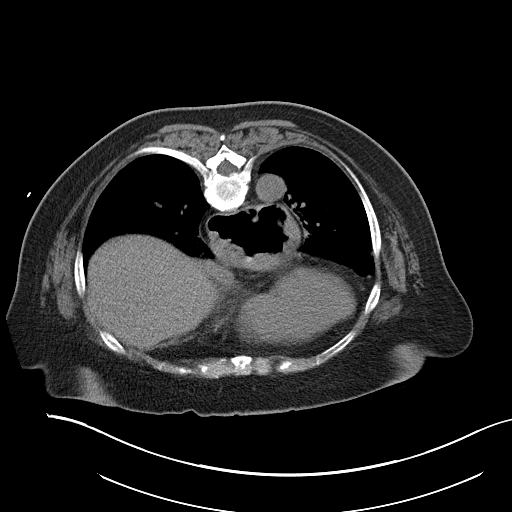
[im 16/53  soft-tissue]
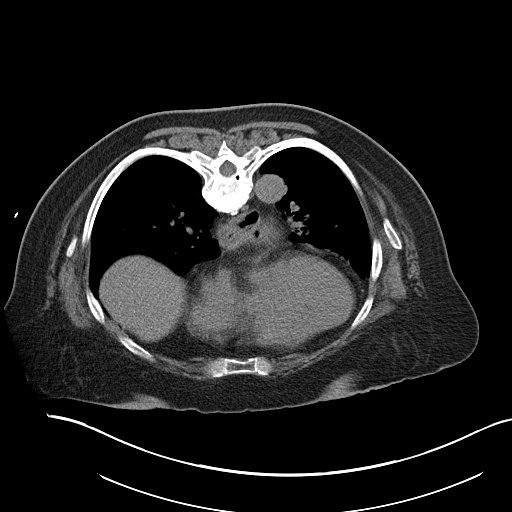
[im 21/53  soft-tissue]
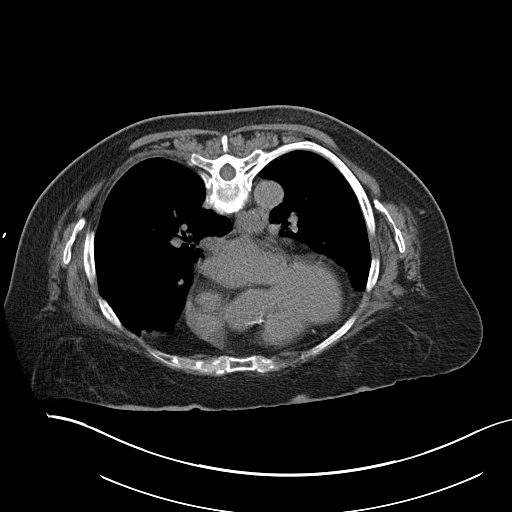
[im 27/53  soft-tissue]
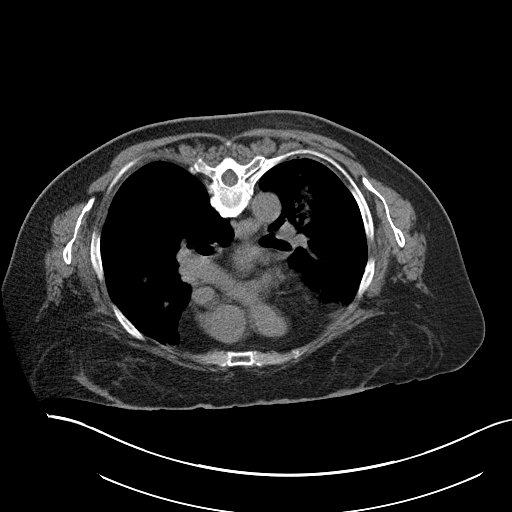
[im 32/53  soft-tissue]
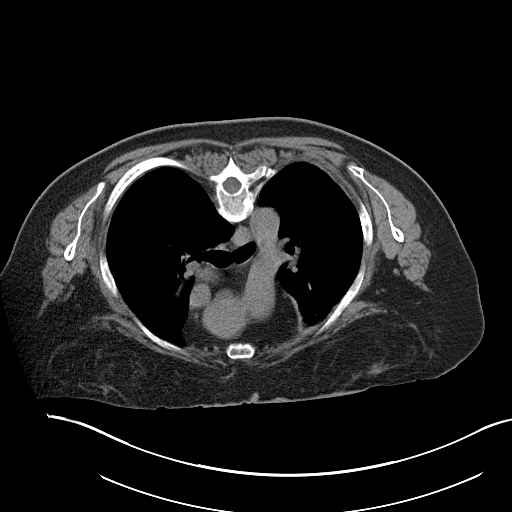
[im 32/53  lung]
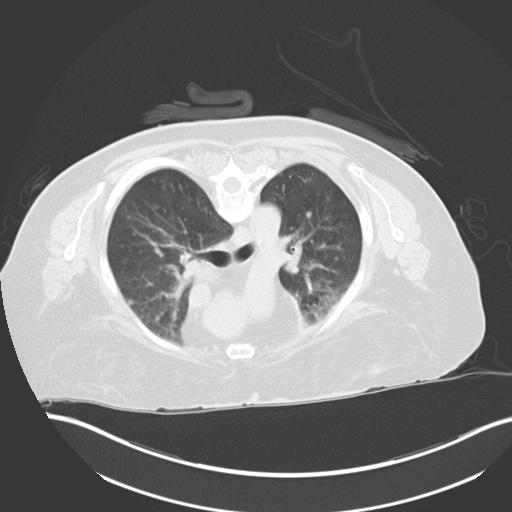
[im 37/53  soft-tissue]
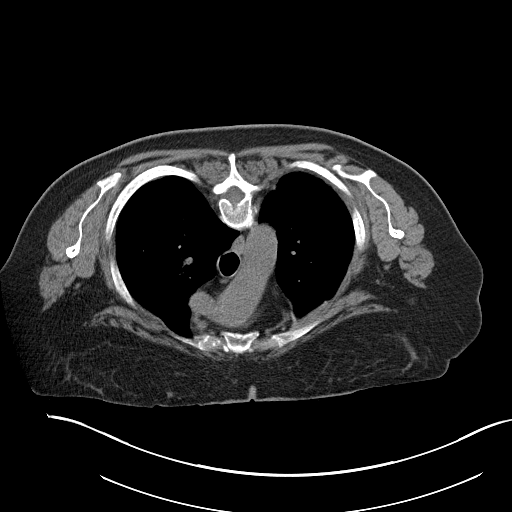
[im 37/53  lung]
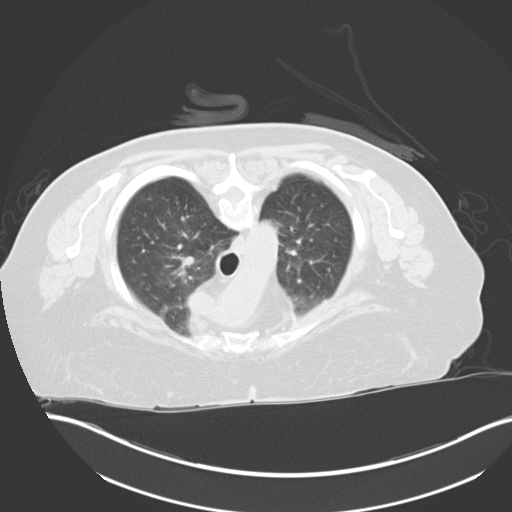
[im 42/53  soft-tissue]
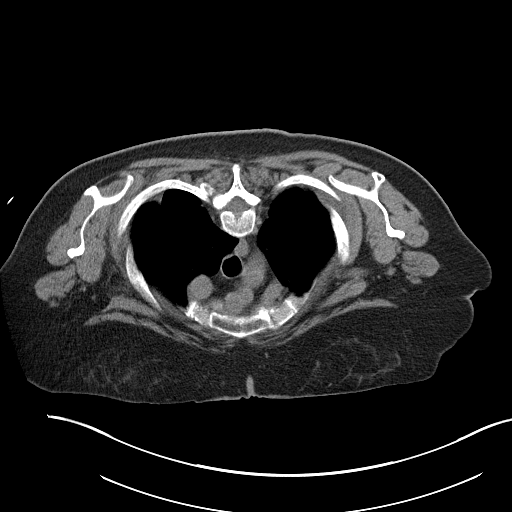
[im 42/53  lung]
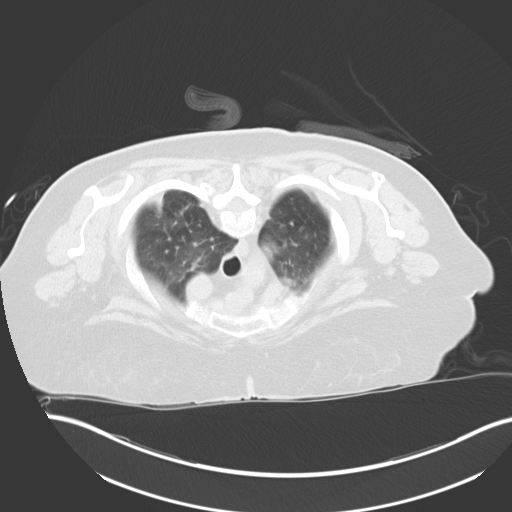
[im 47/53  soft-tissue]
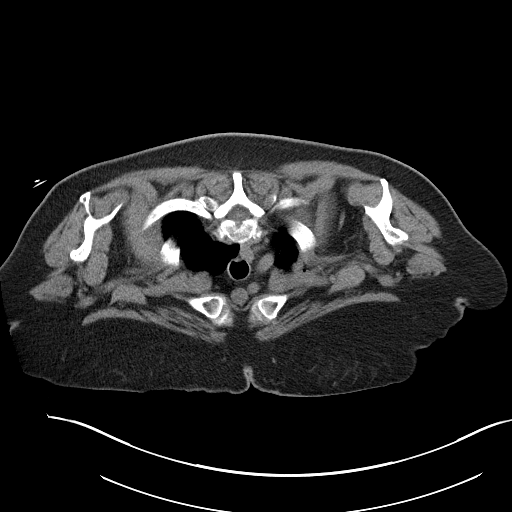
[im 47/53  lung]
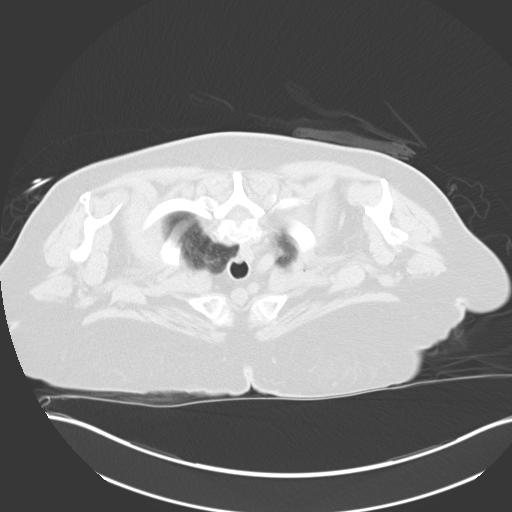
[im 47/53  bone]
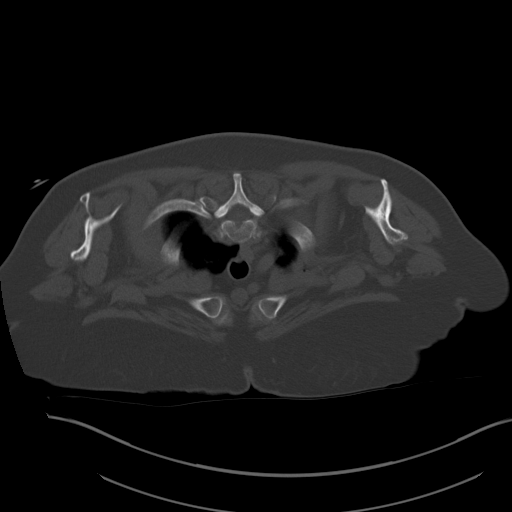

[9 of 32 positions shown; findings below may reference images not displayed]

Previous bronchoscopic biopsy positive however require additional
sampling for molecular analysis.

EXAM:
CT-GUIDED LEFT LUNG NODULE BIOPSY.

RADIATION DOSE REDUCTION: This exam was performed according to the
departmental dose-optimization program which includes automated
exposure control, adjustment of the mA and/or kV according to
patient size and/or use of iterative reconstruction technique.
MEDICATIONS:
None.

ANESTHESIA/SEDATION:
Fentanyl 75 mcg IV; Versed 1.5 mg IV

Sedation time: 36 minutes; The patient was continuously monitored
during the procedure by the interventional radiology nurse under my
direct supervision.

CONTRAST:  None

COMPLICATIONS:
None immediate.

PROCEDURE:
Informed consent was obtained from the patient following an
explanation of the procedure, risks, benefits and alternatives. The
patient understands,agrees and consents for the procedure. All
questions were addressed. A time out was performed prior to the
initiation of the procedure.

The patient was positioned prone on the CT table and a limited chest
CT was performed for procedural planning demonstrating multifocal
pulmonary nodules, with preferred target within the superior segment
of the LEFT lower lobe. The operative site was prepped and draped in
the usual sterile fashion. Under sterile conditions and local
anesthesia, a 17 gauge coaxial needle was advanced into the
peripheral aspect of the nodule. Positioning was confirmed with
intermittent CT fluoroscopy and followed by the acquisition of 6
passes with an 18 gauge core needle biopsy device. The coaxial
needle was removed following deployment of a hemostatic pass and
superficial hemostasis was achieved with manual compression.

Limited post procedural chest CT was negative for pneumothorax or
additional complication. A dressing was placed. The patient
tolerated the procedure well without immediate postprocedural
complication. The patient was escorted to have an upright chest
radiograph.
IMPRESSION: Successful CT-guided biopsy of a lung nodule within the superior
segment of the LEFT lower lobe.

## 2021-08-23 MED ORDER — FENTANYL CITRATE (PF) 100 MCG/2ML IJ SOLN
INTRAMUSCULAR | Status: AC | PRN
  Administered 2021-08-23: 25 ug via INTRAVENOUS
  Administered 2021-08-23: 50 ug via INTRAVENOUS

## 2021-08-23 MED ORDER — FENTANYL CITRATE (PF) 100 MCG/2ML IJ SOLN
INTRAMUSCULAR | Status: AC
  Filled 2021-08-23: qty 4

## 2021-08-23 MED ORDER — LIDOCAINE HCL 1 % IJ SOLN
INTRAMUSCULAR | Status: AC
  Filled 2021-08-23: qty 10

## 2021-08-23 MED ORDER — SODIUM CHLORIDE 0.9 % IV SOLN: INTRAVENOUS | Status: DC

## 2021-08-23 MED ORDER — MIDAZOLAM HCL 2 MG/2ML IJ SOLN
INTRAMUSCULAR | Status: AC | PRN
Start: 2021-08-23 — End: 2021-08-23
  Administered 2021-08-23: .5 mg via INTRAVENOUS
  Administered 2021-08-23: 1 mg via INTRAVENOUS

## 2021-08-23 MED ORDER — MIDAZOLAM HCL 2 MG/2ML IJ SOLN
INTRAMUSCULAR | Status: AC
  Filled 2021-08-23: qty 4

## 2021-08-23 NOTE — Progress Notes (Signed)
Patient returned from procedure and stated that she was having some pressure at her incision site. Site clean, dry, and intact. Patient denied, pain, SOB, or other symptoms. Morrison Old, [place] notified. No further orders at this time. Patient due for CXR at 1200. Will continue to monitor.  ?

## 2021-08-23 NOTE — Progress Notes (Signed)
CXR results called to Dr. Maryelizabeth Kaufmann who stated that patient can be discharged.  ?

## 2021-08-23 NOTE — H&P (Signed)
? ?Chief Complaint: ?Patient was seen in consultation today for lung nodule biopsy  ? ?Referring Physician(s): ?Groce,Sarah F ? ?Supervising Physician: Mugweru, Jon ? ?Patient Status: [facility] - Out-pt ? ?History of Present Illness: ?Michaela Santana is a 75 y.o. female with a medical history significant for HTN, DM, CKD and depression. She presented to the ED 06/09/21 with nausea, vomiting and electrolyte abnormalities identified by her PCP. During her work up CT imaging showed incidental findings of numerous bilateral pulmonary nodules concerning for metastatic disease. She was referred to Pulmonology and she met with Dr. Icard for further evaluation. On 07/14/21 she had a PET scan that showed diffuse, randomly distributed, innumerable pulmonary nodules in the right and left chest. She underwent video bronchoscopy 08/01/21 and the final cytology from a left lower lobe nodule showed malignancy but there was insufficient tissue sample for further studies.  ? ?NM PET 07/14/21 ?IMPRESSION: ?1. Innumerable nodules almost all less than 1 cm and nearly all without ?substantial FDG uptake but small size limiting assessment. Dominant ?nodule in the LEFT upper lobe with mild to moderate uptake, pattern ?remains highly concerning for metastatic process. ?2. No signs of adenopathy in the chest. No signs of hypermetabolic ?disease elsewhere in the neck, chest, abdomen or in the pelvis. ?3. Incidental findings of large hiatal hernia, aortic atherosclerosis ?and spinal degenerative changes. ? ?CT Super D Chest without contrast 07/28/21 ?IMPRESSION: ?1. Persistent bilateral pulmonary nodules. The largest nodule ?measures 12 x 12 mm left upper lobe. ?2. No mediastinal or hilar mass or adenopathy. ?3. Stable large hiatal hernia. ?4. No significant upper abdominal findings. ?5. Aortic atherosclerosis. ? ?Interventional Radiology has been asked to evaluate this patient for an image-guided lung nodule biopsy. Imaging reviewed and procedure  approved by Dr. Mugweru.  ? ?Past Medical History:  ?Diagnosis Date  ? Anemia   ? Arthritis   ? "probably in hands" (07/17/2016)  ? CKD (chronic kidney disease), stage III (HCC)   ? Depression   ? Family history of adverse reaction to anesthesia   ? "her father had some issues when he had his gallbladder taken out when he was in his late 80s"  ? GERD (gastroesophageal reflux disease)   ? Glaucoma   ? open angle, bilateral  ? High cholesterol   ? History of kidney stones   ? Hypertension   ? Lumbago with sciatica, right side   ? Sepsis (HCC) 07/2016  ? d/t flu  ? Type II diabetes mellitus (HCC)   ? Vitamin D deficiency   ? ? ?Past Surgical History:  ?Procedure Laterality Date  ? BREAST BIOPSY  1990  ? "? side; it wasn't anything"  ? BRONCHIAL BIOPSY  08/01/2021  ? Procedure: BRONCHIAL BIOPSIES;  Surgeon: Icard, Bradley L, DO;  Location: [facility];  Service: Pulmonary;;  ? BRONCHIAL NEEDLE ASPIRATION BIOPSY  08/01/2021  ? Procedure: BRONCHIAL NEEDLE ASPIRATION BIOPSIES;  Surgeon: Icard, Bradley L, DO;  Location: [facility];  Service: Pulmonary;;  ? CATARACT EXTRACTION, BILATERAL  2021  ? CHOLECYSTECTOMY OPEN  1971  ? TUBAL LIGATION  1978  ? VIDEO BRONCHOSCOPY WITH RADIAL ENDOBRONCHIAL ULTRASOUND  08/01/2021  ? Procedure: RADIAL ENDOBRONCHIAL ULTRASOUND;  Surgeon: Icard, Bradley L, DO;  Location: [facility];  Service: Pulmonary;;  ? ? ?Allergies: ?Codeine and Alprazolam ? ?Medications: ?Prior to Admission medications   ?Medication Sig Start Date End Date Taking? Authorizing Provider  ?amLODipine (NORVASC) 5 MG tablet Take 5 mg by mouth daily. 01/20/20  Yes [provider]  ?atorvastatin (  LIPITOR) 10 MG tablet Take 10 mg by mouth daily.   Yes [provider]  ?DULoxetine HCl 40 MG CPEP Take 40 mg by mouth daily. 06/29/21  Yes [provider]  ?ferrous sulfate 325 (65 FE) MG EC tablet Take 325 mg by mouth daily.   Yes [provider]  ?gabapentin (NEURONTIN) 300 MG capsule Take 300 mg  by mouth every evening. 01/01/20  Yes [provider]  ?glipiZIDE (GLUCOTROL XL) 5 MG 24 hr tablet Take 5 mg by mouth daily with breakfast. 08/13/16  Yes [provider]  ?lansoprazole (PREVACID) 30 MG capsule Take 30 mg by mouth daily.   Yes [provider]  ?loratadine (CLARITIN) 10 MG tablet Take 10 mg by mouth daily as needed for allergies.   Yes [provider]  ?losartan (COZAAR) 100 MG tablet Take 100 mg by mouth daily. 06/21/21  Yes [provider]  ?magnesium oxide (MAG-OX) 400 (240 Mg) MG tablet Take 1,000 mg by mouth daily. Taking 2.5 tablets starting today 1,000 mg 06/23/21  Yes [provider]  ?metFORMIN (GLUCOPHAGE-XR) 500 MG 24 hr tablet Take 500-1,000 mg by mouth See admin instructions. Take 1 tablet ( 500 mg) in the morning and 2 tablets (1000 mg) in the evening   Yes [provider]  ?Propylene Glycol (SYSTANE BALANCE) 0.6 % SOLN Place 1 drop into both eyes daily as needed (dry eyes).   Yes [provider]  ?Vitamin D, Ergocalciferol, (DRISDOL) 50000 units CAPS capsule Take 50,000 Units by mouth every Friday.   Yes [provider]  ?Blood Glucose Monitoring Suppl (FREESTYLE LITE) DEVI USE UTD 07/26/16   [provider]  ?FREESTYLE LITE test strip USE TO CHECK FASTING GLUCOSE IN THE MORNING AND 2 HOURS AFTER DINNER OR LUNCH 07/26/16   [provider]  ?Lancets (FREESTYLE) lancets  07/26/16   [provider]  ?  ? ?Family History  ?Problem Relation Age of Onset  ? Dementia Mother   ? Diabetes Mother   ? Stroke Father   ? Hypertension Father   ? Diabetes Sister   ? Hypertension Sister   ? Heart disease Paternal Grandfather   ? Breast cancer Neg Hx   ? ? ?Social History  ? ?Socioeconomic History  ? Marital status: Widowed  ?  Spouse name: Not on file  ? Number of children: 2  ? Years of education: Not on file  ? Highest education level: Some college, no degree  ?Occupational History  ? Occupation:  Environmental consultant  ?Tobacco Use  ? Smoking status: Never  ? Smokeless tobacco: Never  ?Vaping Use  ? Vaping Use: Never used  ?Substance and Sexual Activity  ? Alcohol use: Never  ? Drug use: Never  ? Sexual activity: Not on file  ?Other Topics Concern  ? Not on file  ?Social History Narrative  ? 02/08/20 Lives alone  ? caffeine soda 1-2 daily  ? ?Social Determinants of Health  ? ?Financial Resource Strain: Not on file  ?Food Insecurity: Not on file  ?Transportation Needs: Not on file  ?Physical Activity: Not on file  ?Stress: Not on file  ?Social Connections: Not on file  ? ? ?Review of Systems: A 12 point ROS discussed and pertinent positives are indicated in the HPI above.  All other systems are negative. ? ?Review of Systems  ?Constitutional:  Positive for fatigue. Negative for appetite change.  ?Respiratory:  Negative for cough and shortness of breath.   ?Cardiovascular:  Negative for chest  pain and leg swelling.  ?Gastrointestinal:  Negative for abdominal pain, diarrhea, nausea and vomiting.  ?Neurological:  Negative for dizziness and headaches.  ? ?Vital Signs: ?BP (!) 158/85   Pulse 88   Temp 98 ?F (36.7 ?C) (Oral)   Resp 15   Ht 5' 1" (1.549 m)   Wt 175 lb (79.4 kg)   SpO2 98%   BMI 33.07 kg/m?  ? ?Physical Exam ?Constitutional:   ?   General: She is not in acute distress. ?   Appearance: She is not ill-appearing.  ?HENT:  ?   Mouth/Throat:  ?   Mouth: Mucous membranes are moist.  ?   Pharynx: Oropharynx is clear.  ?Cardiovascular:  ?   Rate and Rhythm: Normal rate and regular rhythm.  ?   Pulses: Normal pulses.  ?   Heart sounds: Normal heart sounds.  ?Pulmonary:  ?   Effort: Pulmonary effort is normal.  ?   Breath sounds: Normal breath sounds.  ?Abdominal:  ?   General: Bowel sounds are normal.  ?   Palpations: Abdomen is soft.  ?   Tenderness: There is no abdominal tenderness.  ?Skin: ?   General: Skin is warm and dry.  ?Neurological:  ?   Mental Status: She is alert and oriented to person, place, and time.   ? ? ?Imaging: ?DG Chest Port 1 View ? ?Result Date: 08/01/2021 ?CLINICAL DATA:  Status post bronchoscopy. Biopsy. EXAM: PORTABLE CHEST 1 VIEW COMPARISON:  CT chest without contrast 07/28/2021 FINDINGS: Heart size i

## 2021-08-23 NOTE — Procedures (Signed)
Vascular and Interventional Radiology Procedure Note ? ?Patient: Michaela Santana ?DOB: Mar 10, 1946 ?Medical Record Number: [id number] ?Note Date/Time: 08/23/21 11:14 AM  ? ?Performing Physician: [name], MD ?Assistant(s): None ? ?Diagnosis: Lung nodules. Prev Bx positive, require additional tissue. ? ?Procedure: LEFT LUNG NODULE BIOPSY ? ?Anesthesia: Conscious Sedation ?Complications: None ?Estimated Blood Loss: Minimal ?Specimens: Sent for Pathology ? ?Findings:  ?Successful CT-guided biopsy of L lung nodule. ?A total of 5 samples were obtained. ?Hemostasis of the tract was achieved using Hemostatic Patch. ? ?Plan: Bed rest for 1 hours. ?CXR in 1 Hr. ? ?See detailed procedure note with images in PACS. ?The patient tolerated the procedure well without incident or complication and was returned to Recovery in stable condition.  ? ? ?[name], MD ?[facility] ?[facility] ? ? ?Pager. [phone] ?Clinic. [phone]  ?

## 2021-08-24 ENCOUNTER — Ambulatory Visit (HOSPITAL_COMMUNITY)
Admission: RE | Admit: 2021-08-24 | Discharge: 2021-08-24 | Disposition: A | Discharge status: Home / Self Care | Payer: Medicare Other | Source: Ambulatory Visit | Attending: Internal Medicine | Admitting: Internal Medicine

## 2021-08-24 DIAGNOSIS — C3490 Malignant neoplasm of unspecified part of unspecified bronchus or lung: Secondary | ICD-10-CM | POA: Diagnosis not present

## 2021-08-24 IMAGING — MR MR HEAD WO/W CM
15 series · 48 of 48 positions shown · IV contrast (GADAVIST)
Comparison: None.

CLINICAL DATA: Non-small cell lung cancer (NSCLC), staging

EXAM:
MRI HEAD WITHOUT AND WITH CONTRAST
TECHNIQUE: Multiplanar, multiecho pulse sequences of the brain and surrounding
structures were obtained without and with intravenous contrast.
CONTRAST:  7.5mL GADAVIST GADOBUTROL 1 MMOL/ML IV SOLN

[Series 5: DWI · axial · 3.0mm · 1.36mm/px · z∈[-66,+73]mm · 6 of 96 slices shown (1 of 2)]
[im 1/96]
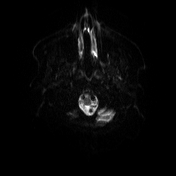
[im 20/96]
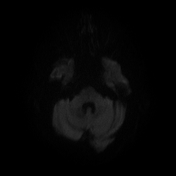
[im 39/96]
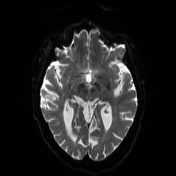
[im 58/96]
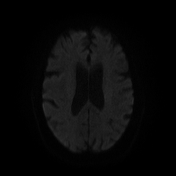
[im 77/96]
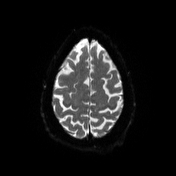
[im 96/96]
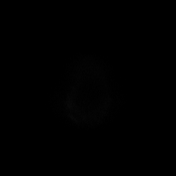

[Series 6: DWI · axial · 3.0mm · 1.36mm/px · z∈[-66,+73]mm · 3 of 48 slices shown (2 of 2)]
[im 1/48]
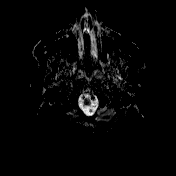
[im 24/48]
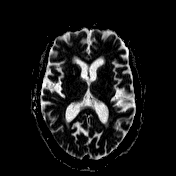
[im 48/48]
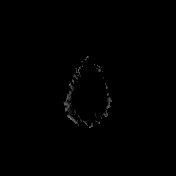

[Series 7: T1 · sagittal · 5.0mm · 0.75mm/px · 1 of 24 slices shown (1 of 4)]
[im 1/24]
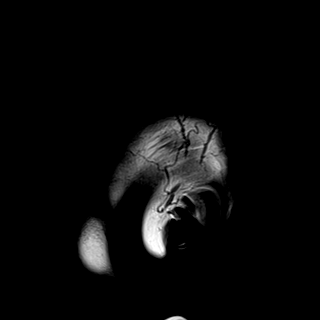

[Series 8: T2 · axial · 5.0mm · 0.62mm/px · z∈[-77,+83]mm · 2 of 26 slices shown]
[im 1/26]
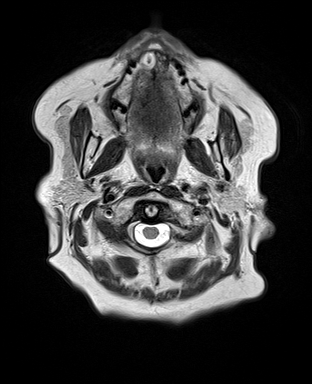
[im 26/26]
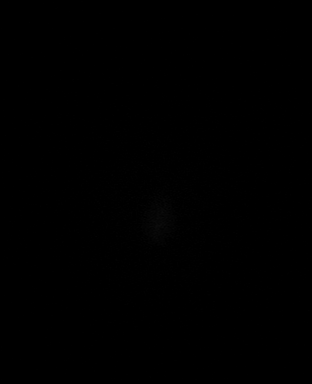

[Series 9: swi_images · axial · 3.0mm · 0.75mm/px · z∈[-78,+85]mm · 3 of 56 slices shown]
[im 1/56]
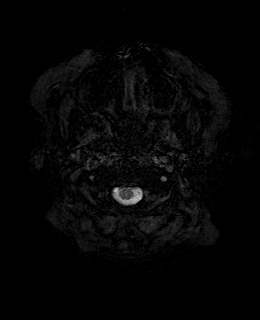
[im 28/56]
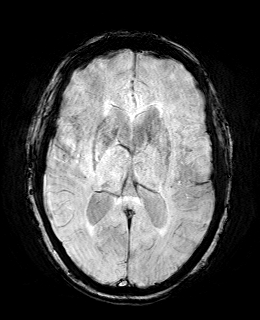
[im 56/56]
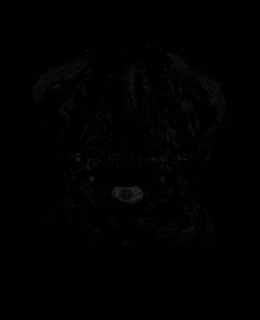

[Series 11: FLAIR · axial · 3.0mm · 0.75mm/px · z∈[-72,+79]mm · 3 of 52 slices shown]
[im 1/52]
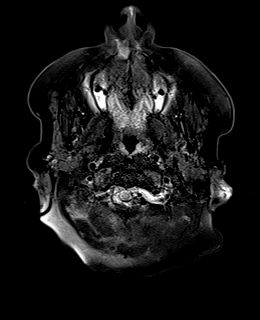
[im 26/52]
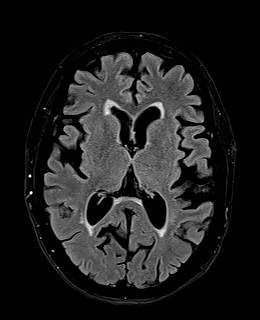
[im 52/52]
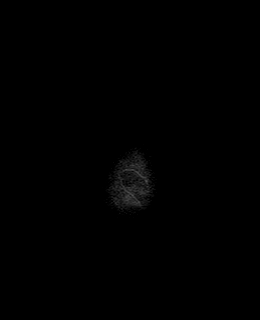

[Series 12: T1 · axial · 1.0mm · 0.94mm/px · z∈[-68,+74]mm · 8 of 144 slices shown (2 of 4)]
[im 1/144]
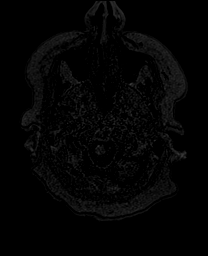
[im 21/144]
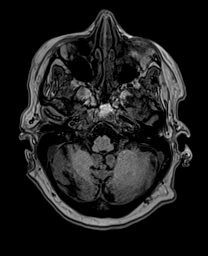
[im 41/144]
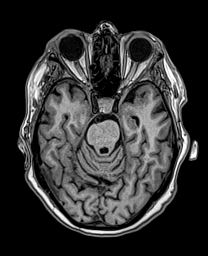
[im 62/144]
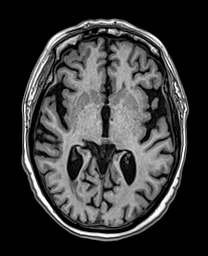
[im 82/144]
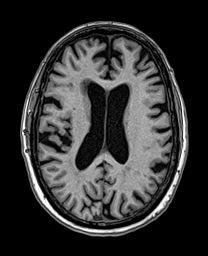
[im 103/144]
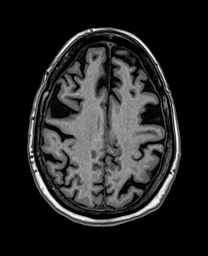
[im 123/144]
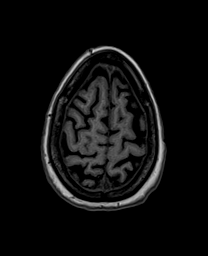
[im 144/144]
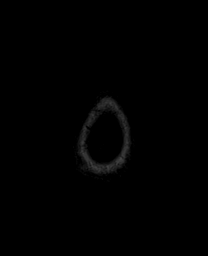

[Series 13: cor dwi_tracew · coronal · 5.0mm · 1.53mm/px · 3 of 56 slices shown]
[im 1/56]
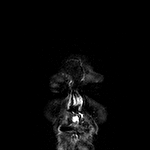
[im 28/56]
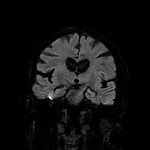
[im 56/56]
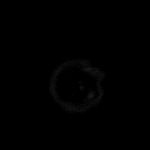

[Series 14: cor dwi_adc · coronal · 5.0mm · 1.53mm/px · 2 of 28 slices shown]
[im 1/28]
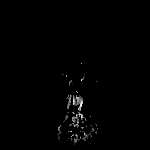
[im 28/28]
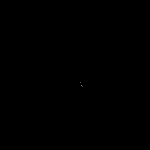

[Series 15: T2 post-contrast · coronal · 5.0mm · 0.57mm/px · 2 of 28 slices shown]
[im 1/28]
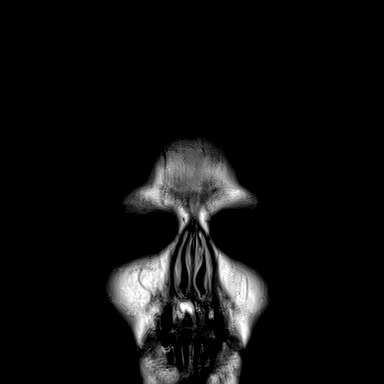
[im 28/28]
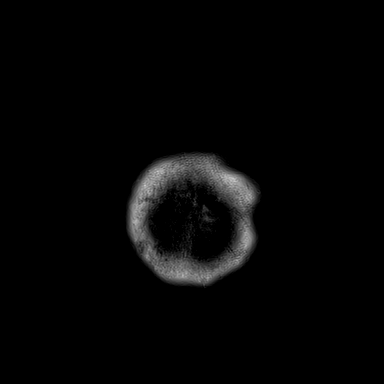

[Series 16: T1 post-contrast · axial · 1.0mm · 0.94mm/px · z∈[-68,+74]mm · 8 of 144 slices shown (1 of 3)]
[im 1/144]
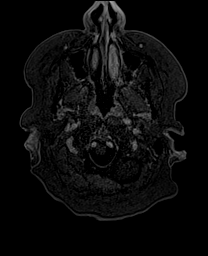
[im 21/144]
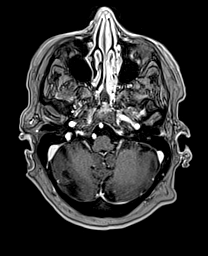
[im 41/144]
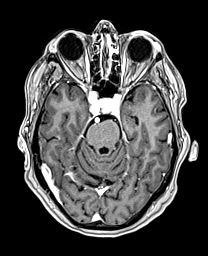
[im 62/144]
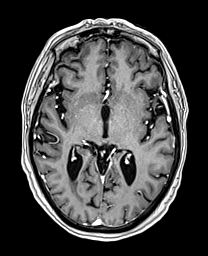
[im 82/144]
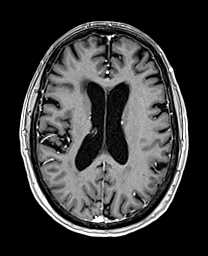
[im 103/144]
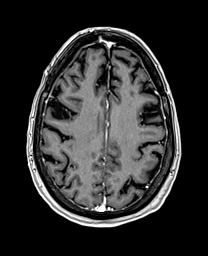
[im 123/144]
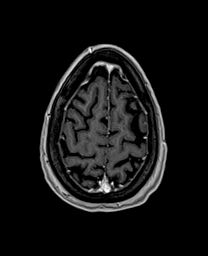
[im 144/144]
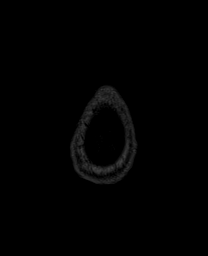

[Series 17: T1 · sagittal · 4.0mm · 0.94mm/px · 2 of 36 slices shown (3 of 4)]
[im 1/36]
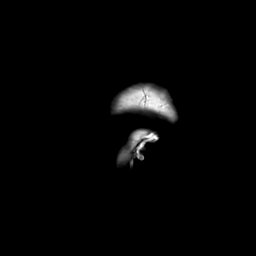
[im 36/36]
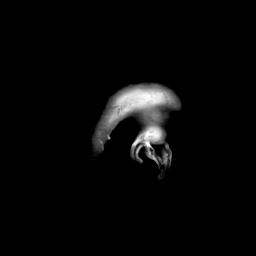

[Series 18: T1 · coronal · 4.0mm · 0.94mm/px · 2 of 30 slices shown (4 of 4)]
[im 1/30]
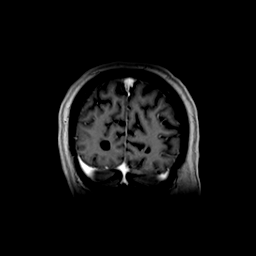
[im 30/30]
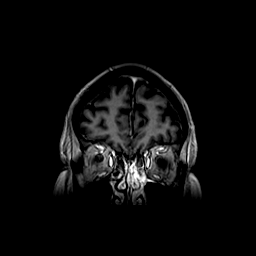

[Series 19: T1 post-contrast · coronal · 5.0mm · 0.43mm/px · 2 of 28 slices shown (2 of 3)]
[im 1/28]
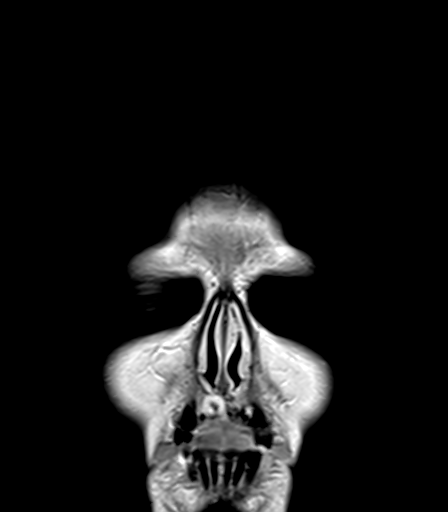
[im 28/28]
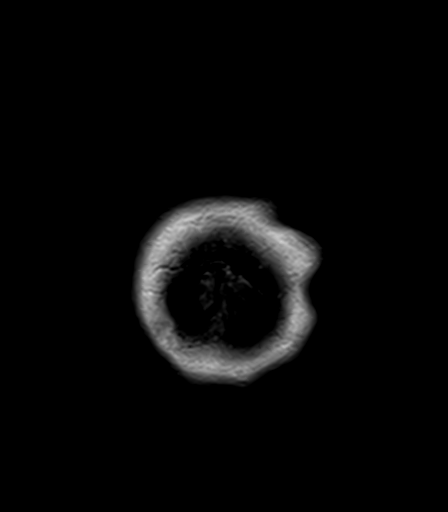

[Series 20: T1 post-contrast · sagittal · 5.0mm · 0.75mm/px · 1 of 24 slices shown (3 of 3)]
[im 1/24]
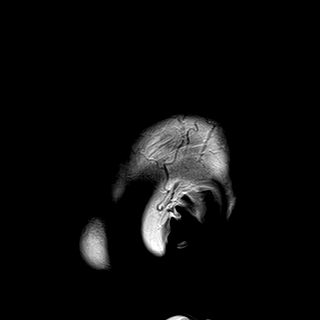

[48 of 48 positions shown; findings below may reference images not displayed]

FINDINGS: Brain: There is no acute infarction or intracranial hemorrhage.
There is no intracranial mass, mass effect, or edema. There is no
hydrocephalus or extra-axial fluid collection. Prominence of the
ventricles and sulci reflects parenchymal volume loss. Patchy T2
hyperintensity in the supratentorial white matter is nonspecific may
reflect mild chronic microvascular ischemic changes. No abnormal
enhancement.

Vascular: Major vessel flow voids at the skull base are preserved.

Skull and upper cervical spine: Normal marrow signal is preserved.

Sinuses/Orbits: Paranasal sinuses are aerated. Orbits are
unremarkable.

Other: Sella is unremarkable.  Mastoid air cells are clear.
IMPRESSION: No evidence of intracranial metastatic disease.

## 2021-08-24 MED ORDER — GADOBUTROL 1 MMOL/ML IV SOLN
7.5000 mL | Freq: Once | INTRAVENOUS | Status: AC | PRN
  Administered 2021-08-24: 7.5 mL via INTRAVENOUS

## 2021-08-25 ENCOUNTER — Telehealth: Payer: Self-pay | Admitting: Pulmonary Disease

## 2021-08-25 NOTE — Telephone Encounter (Signed)
Called and spoke with [place]. She verbalized understanding and feels a lot better knowing that these symptoms are pretty normal. She is aware that if anything changes over the weekend to go the nearest ED.  ? ?Nothing further needed at time of call.  ?

## 2021-08-25 NOTE — Telephone Encounter (Signed)
Called patient's daughter [name]. She stated that her mother had a biopsy on 08/23/21 and has been coughing up blood ever since. The phlegm started out as a dark red color. Not it is clear with streaks of bright red blood. She has also been more SOB as well. She woke up with a fever this morning. Denied any chest pain.  ? ?I advised her that it is normal to see some blood after biopsy but not for patients to develop a fever.  ? ?BI, can you please advise? Thanks!  ?

## 2021-08-25 NOTE — Telephone Encounter (Signed)
I called the patient with the diagnosis of her biopsy. (Adenocarcinoma)  She has follow up with Dr. Julien Nordmann 09/07/2021.  ?

## 2021-08-28 ENCOUNTER — Encounter: Payer: Self-pay | Admitting: *Deleted

## 2021-08-28 NOTE — Progress Notes (Signed)
Oncology Nurse Navigator Documentation ? ?Oncology Nurse Navigator Flowsheets 08/28/2021 08/21/2021  ?Abnormal Finding Date - 06/09/2021  ?Confirmed Diagnosis Date - 08/23/2021  ?Diagnosis Status Pending Molecular Studies Additional Work Up  ?Navigator Follow Up Date: 09/07/2021 08/24/2021  ?Navigator Follow Up Reason: Follow-up After Biopsy Pathology  ?Navigator Location [facility]  ?Navigator Encounter Type Pathology Review Appt/Treatment Plan Review  ?Patient Visit Type - Other  ?Treatment Phase - Abnormal Scans  ?Barriers/Navigation Needs Coordination of Care/I received a message from Dr. Julien Nordmann to send recent tissue for molecular and PDL 1 testing.  I notified pathology to send.  Coordination of Care  ?Interventions Coordination of Care Coordination of Care  ?Acuity Level 2-Minimal Needs (1-2 Barriers Identified) Level 2-Minimal Needs (1-2 Barriers Identified)  ?Coordination of Care Pathology Other  ?Time Spent with Patient 45 30  ?  ?

## 2021-08-29 ENCOUNTER — Other Ambulatory Visit: Payer: Self-pay

## 2021-08-29 ENCOUNTER — Ambulatory Visit: Payer: Medicare Other | Admitting: Physical Therapy

## 2021-08-29 ENCOUNTER — Encounter: Payer: Self-pay | Admitting: Physical Therapy

## 2021-08-29 DIAGNOSIS — M545 Low back pain, unspecified: Secondary | ICD-10-CM

## 2021-08-29 DIAGNOSIS — M6281 Muscle weakness (generalized): Secondary | ICD-10-CM | POA: Diagnosis not present

## 2021-08-29 DIAGNOSIS — G8929 Other chronic pain: Secondary | ICD-10-CM | POA: Diagnosis not present

## 2021-08-29 NOTE — Therapy (Addendum)
OUTPATIENT PHYSICAL THERAPY TREATMENT NOTE   Patient Name: Michaela Santana MRN: [id number] DOB:01/04/1946, 76 y.o., female Today's Date: 08/29/2021  PCP: Timberlake, Kathryn S, MD REFERRING PROVIDER: Timberlake, Kathryn S, *   PT End of Session - 08/29/21 0846     Visit Number 2    Date for PT Re-Evaluation 10/10/21    Authorization Type Medicare Part A/B, [employer]    Progress Note Due on Visit 10    PT Start Time 0846    PT Stop Time 0915    PT Time Calculation (min) 29 min    Activity Tolerance Patient tolerated treatment well    Behavior During Therapy WFL for tasks assessed/performed             Past Medical History:  Diagnosis Date   Anemia    Arthritis    "probably in hands" (07/17/2016)   CKD (chronic kidney disease), stage III (HCC)    Depression    Family history of adverse reaction to anesthesia    "her father had some issues when he had his gallbladder taken out when he was in his late 80s"   GERD (gastroesophageal reflux disease)    Glaucoma    open angle, bilateral   High cholesterol    History of kidney stones    Hypertension    Lumbago with sciatica, right side    Sepsis (HCC) 07/2016   d/t flu   Type II diabetes mellitus (HCC)    Vitamin D deficiency    Past Surgical History:  Procedure Laterality Date   BREAST BIOPSY  1990   "? side; it wasn't anything"   BRONCHIAL BIOPSY  08/01/2021   Procedure: BRONCHIAL BIOPSIES;  Surgeon: Icard, Bradley L, DO;  Location: [facility];  Service: Pulmonary;;   BRONCHIAL NEEDLE ASPIRATION BIOPSY  08/01/2021   Procedure: BRONCHIAL NEEDLE ASPIRATION BIOPSIES;  Surgeon: Icard, Bradley L, DO;  Location: [facility];  Service: Pulmonary;;   CATARACT EXTRACTION, BILATERAL  2021   CHOLECYSTECTOMY OPEN  1971   TUBAL LIGATION  1978   VIDEO BRONCHOSCOPY WITH RADIAL ENDOBRONCHIAL ULTRASOUND  08/01/2021   Procedure: RADIAL ENDOBRONCHIAL ULTRASOUND;  Surgeon: Icard, Bradley L, DO;  Location: [facility];  Service:  Pulmonary;;   Patient Active Problem List   Diagnosis Date Noted   Lung cancer, primary, 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, left (HCC) 08/18/2021   Multiple pulmonary nodules 07/19/2021   Intractable nausea and vomiting 06/09/2021   Spondylosis without myelopathy or radiculopathy, lumbar region 10/17/2016   CHF (congestive heart failure) (HCC) 07/17/2016   Sepsis (HCC) 07/17/2016   Influenza 07/17/2016   Congestive heart failure (HCC)    Hypotension    Urinary tract infection with hematuria     REFERRING DIAG: M54.41 (ICD-10-CM) - Lumbago with sciatica, right side R53.81 (ICD-10-CM) - Other malaise   THERAPY DIAG:  Chronic left-sided low back pain, unspecified whether sciatica present  Muscle weakness (generalized)  PERTINENT HISTORY:  Feb 2023: metastatic cell findings in bilateral pulmonary nodules, Stage IV disease, via needle biopsy.  Further work up scheduled to determine treatment.   PMH: hypertension, diabetes type 2, vitamin D deficiency, chronic kidney disease stage III Social history: Pt lost husband 4 years ago  PRECAUTIONS: Other: current work up for lung metastatic cells bil   SUBJECTIVE:  I have a sick family member in the hospital and things are crazy.  I have not done the exercises because things are crazy.  I had a brain MRI to see if   any brain cancer is there given it is in my lungs.   PAIN:  Are you having pain? Yes: NPRS scale: 4/10 Pain location: Lt LBP Pain description: tight, achey Aggravating factors:  Relieving factors:    DIAGNOSTIC FINDINGS:  From chart review: bilateral pulmonary nodules worrisome for metastasis. She underwent biopsy 08/01/2021 and was found to have malignant cells per the fine needle biopsy. Stage IV disease.           MUSCLE LENGTH: Hamstrings: Right 65 deg; Left 45 deg Quad and hip flexor end range restrictions   POSTURE:  WFL    PALPATION: Bil SI joints, Lt glut med/min, bil lumbar paraspinals    LUMBARAROM/PROM   A/PROM A/PROM  08/15/2021  Flexion Slow but fingers to toes  Extension 2 deg  Right lateral flexion full  Left lateral flexion full  Right rotation Limited 25%  Left rotation Limited 25%   (Blank rows = not tested)   LE AROM/PROM:   A/PROM Right 08/15/2021 Left 08/15/2021  Hip flexion   45 (hamstring limits)  Hip extension      Hip abduction      Hip adduction      Hip internal rotation 5 5  Hip external rotation full 50  Knee flexion      Knee extension      Ankle dorsiflexion      Ankle plantarflexion      Ankle inversion      Ankle eversion       (Blank rows = not tested)   LE MMT:   Hips 4-/5 bil except hip abd 3+/5 bil knee extension 4/5 bil  knee flexion 3+/5 Ankle DF 5/5 bil       FUNCTIONAL TESTS:  Transfers: uses UE assist to rise from chair SLS: unable to balance Rt/Lt  Squat: able to squat to reach floor Spinal mobility: limited lower lumbar facets 5x sit to stand: 17 sec with UE assistance TUG: 12 sec   GAIT:   Assistive device utilized: None Level of assistance: Complete Independence Comments: WFL       TODAY'S TREATMENT  08/29/21: Seated lumbar flexion and flexion with SB x 10 each with blue physioball roll outs Supine piriformis stretch hooklying 2x20" Supine posterior pelvic tilt x 15 reps Supine hooklying ball squeeze 10x3" holds Supine hooklying hip bil clam yellow loop band x 10 in pelvic tilt Seated hamstring stretch 1x20" bil Heel raises bil UE support on barre x 15 Standing march alt Les at barre x 20  Evaluation:  Seated hamstring stretch Seated lumbar flexion stretch Sit to stand     PATIENT EDUCATION:  Education details: HEP and discussion of eval findings Person educated: Patient Education method: Explanation, Demonstration, and Handouts Education comprehension: verbalized understanding and returned demonstration     HOME EXERCISE PROGRAM: Access Code: [email or web address] URL:  [email or web address] Date: 08/15/2021 Prepared by: Nathifa Ritthaler   Exercises Seated Hamstring Stretch - 1 x daily - 7 x weekly - 1 sets - 2 reps - 20 hold Seated Flexion Stretch - 1 x daily - 7 x weekly - 1 sets - 3 reps - 10 hold Sit to Stand with Hands on Knees - 3 x daily - 7 x weekly - 1 sets - 5 reps     ASSESSMENT:   CLINICAL IMPRESSION: Short session today as Pt is fatigued and noted increased pain within session.  Pt has new Dx of lung cancer and has results pending for brain MRI to determine presence of brain   cancer.  She also has sick family member with complex medical issues in hospital as of last night so stress is high for Pt.  PT reviewed initial stretches from eval and progressed further gentle spine ROM and LE stretches.  Pt was able to perform pelvic tilt but was unable to tolerate pelvic tilt to bridge.  She also had pain with efforts to stretch hip flexors and lower trunk rotation so d/c'd.  Initiated standing heel raises and standing march with UE support bil.  Pt did note fatigue and increased pain from 2/10 to 4-5/10 within 30 min so short session.  Continue along POC with ongoing assessment of response to treatment.     OBJECTIVE IMPAIRMENTS decreased activity tolerance, decreased balance, decreased ROM, decreased strength, hypomobility, impaired flexibility, and pain.    ACTIVITY LIMITATIONS cleaning, community activity, meal prep, laundry, and shopping.    PERSONAL FACTORS Fitness, Time since onset of injury/illness/exacerbation, and 1 comorbidity: ongoing work up for metastatic cells in bil pulmonary nodules  are also affecting patient's functional outcome.      REHAB POTENTIAL: Good   CLINICAL DECISION MAKING: Stable/uncomplicated   EVALUATION COMPLEXITY: Low     GOALS: Goals reviewed with patient? Yes   SHORT TERM GOALS:   Ind with initial HEP Baseline:  Target date: 09/05/2021 Goal status: INITIAL   2.  Pt will reduce 5x sit to  stand to </= 14 sec without need for UE assist Baseline:  Target date: 09/12/2021 Goal status: INITIAL   3.  Pt will achieve Lt hamstring length to at least 65 deg Baseline: 45 Target date: 10/10/2021 Goal status: INITIAL   4.  Pt will be able to go up/down 4x6" stairs using railing x 3 rounds Baseline:  Target date: 09/12/2021 Goal status: INITIAL   5.  Pt will report at least 20% reduction in LBP with light household tasks. Baseline:  Target date: 09/12/2021 Goal status: INITIAL       LONG TERM GOALS:   Pt will be able to participate in 6' walk test covering at least 500' for improved gait endurance. Baseline:  Target date: 10/10/2021 Goal status: INITIAL   2.  Pt will achieve LE strength of at least 4/5 in all muscle groups for improved gait, stairs, and functional squatting tasks. Baseline:  Target date: 10/10/2021 Goal status: INITIAL   3.  Pt will report at least 60% improvement in LBP with standing household tasks. Baseline:  Target date: 10/10/2021 Goal status: INITIAL       PLAN: PT FREQUENCY: 1-2x/week   PT DURATION: 8 weeks   PLANNED INTERVENTIONS: Therapeutic exercises, Therapeutic activity, Neuromuscular re-education, Balance training, Gait training, Patient/Family education, Joint mobilization, Aquatic Therapy, Dry Needling, Spinal mobilization, and Manual therapy   PLAN FOR NEXT SESSION: try NuStep warm up, supine stretches, seated LE strength as tol (LAQ, sit to stand)      [employer], PT 08/29/21 9:20 AM  PHYSICAL THERAPY DISCHARGE SUMMARY  Visits from Start of Care: 2  Current functional level related to goals / functional outcomes: Pt with change in medical condition.  D/C PT   Remaining deficits: See above   Education / Equipment: Initial HEP   Patient agrees to discharge. Patient goals were not met. Patient is being discharged due to a change in medical status.  Sharlize Hoar, PT 11/01/21 9:25 AM

## 2021-08-31 ENCOUNTER — Encounter: Payer: Self-pay | Admitting: *Deleted

## 2021-08-31 ENCOUNTER — Other Ambulatory Visit: Payer: Self-pay | Admitting: *Deleted

## 2021-08-31 NOTE — Progress Notes (Signed)
Oncology Nurse Navigator Documentation ? ? ?  08/31/2021  ? 10:00 AM 08/28/2021  ?  3:00 PM 08/21/2021  ? 12:00 PM  ?Oncology Nurse Navigator Flowsheets  ?Abnormal Finding Date   06/09/2021  ?Confirmed Diagnosis Date   08/23/2021  ?Diagnosis Status  Pending Molecular Studies Additional Work Up  ?Navigator Follow Up Date:  09/07/2021 08/24/2021  ?Navigator Follow Up Reason:  Follow-up After Biopsy Pathology  ?Navigator Location [facility]  ?Navigator Encounter Type  Pathology Review Appt/Treatment Plan Review  ?Patient Visit Type   Other  ?Treatment Phase   Abnormal Scans  ?Barriers/Navigation Needs Coordination of Care/per Dr. Valeta Harms, blood test for molecular sent to [facility].  Patient's case discussed in cancer conference 08/31/21, see flow sheet for documentation.  Coordination of Care Coordination of Care  ?Interventions Coordination of Care Coordination of Care Coordination of Care  ?Acuity Level 2-Minimal Needs (1-2 Barriers Identified) Level 2-Minimal Needs (1-2 Barriers Identified) Level 2-Minimal Needs (1-2 Barriers Identified)  ?Coordination of Care Pathology Pathology Other  ?Time Spent with Patient 30 45 30  ?  ?

## 2021-08-31 NOTE — Progress Notes (Signed)
The proposed treatment discussed in cancer conference 08/31/21 is for discussion purpose only and is not a binding recommendation.  The patient was not physically examined nor present for their treatment options.  Therefore, final treatment plans cannot be decided.  ?

## 2021-09-07 ENCOUNTER — Inpatient Hospital Stay (HOSPITAL_BASED_OUTPATIENT_CLINIC_OR_DEPARTMENT_OTHER): Payer: Medicare Other | Admitting: Internal Medicine

## 2021-09-07 ENCOUNTER — Other Ambulatory Visit: Payer: Self-pay

## 2021-09-07 ENCOUNTER — Encounter: Payer: Self-pay | Admitting: *Deleted

## 2021-09-07 ENCOUNTER — Inpatient Hospital Stay: Payer: Medicare Other

## 2021-09-07 VITALS — BP 173/90 | HR 65 | Temp 98.8°F | Wt 174.1 lb

## 2021-09-07 DIAGNOSIS — C3490 Malignant neoplasm of unspecified part of unspecified bronchus or lung: Secondary | ICD-10-CM

## 2021-09-07 DIAGNOSIS — C7A090 Malignant carcinoid tumor of the bronchus and lung: Secondary | ICD-10-CM

## 2021-09-07 DIAGNOSIS — C3492 Malignant neoplasm of unspecified part of left bronchus or lung: Secondary | ICD-10-CM | POA: Diagnosis not present

## 2021-09-07 DIAGNOSIS — Z7984 Long term (current) use of oral hypoglycemic drugs: Secondary | ICD-10-CM | POA: Diagnosis not present

## 2021-09-07 DIAGNOSIS — N183 Chronic kidney disease, stage 3 unspecified: Secondary | ICD-10-CM | POA: Diagnosis not present

## 2021-09-07 DIAGNOSIS — I129 Hypertensive chronic kidney disease with stage 1 through stage 4 chronic kidney disease, or unspecified chronic kidney disease: Secondary | ICD-10-CM | POA: Diagnosis not present

## 2021-09-07 DIAGNOSIS — Z79899 Other long term (current) drug therapy: Secondary | ICD-10-CM | POA: Diagnosis not present

## 2021-09-07 DIAGNOSIS — E1122 Type 2 diabetes mellitus with diabetic chronic kidney disease: Secondary | ICD-10-CM | POA: Diagnosis not present

## 2021-09-07 LAB — CMP (CANCER CENTER ONLY)
ALT: 14 U/L (ref 0–44)
AST: 14 U/L — ABNORMAL LOW (ref 15–41)
Albumin: 4 g/dL (ref 3.5–5.0)
Alkaline Phosphatase: 74 U/L (ref 38–126)
Anion gap: 10 (ref 5–15)
BUN: 18 mg/dL (ref 8–23)
CO2: 26 mmol/L (ref 22–32)
Calcium: 9.9 mg/dL (ref 8.9–10.3)
Chloride: 104 mmol/L (ref 98–111)
Creatinine: 0.98 mg/dL (ref 0.44–1.00)
GFR, Estimated: >60 mL/min (ref >60)
Glucose, Bld: 190 mg/dL — ABNORMAL HIGH (ref 70–99)
Potassium: 3.8 mmol/L (ref 3.5–5.1)
Sodium: 140 mmol/L (ref 135–145)
Total Bilirubin: 0.5 mg/dL (ref 0.3–1.2)
Total Protein: 6.7 g/dL (ref 6.5–8.1)

## 2021-09-07 LAB — SURGICAL PATHOLOGY

## 2021-09-07 LAB — CBC WITH DIFFERENTIAL (CANCER CENTER ONLY)
Abs Immature Granulocytes: 0.01 10*3/uL (ref 0.00–0.07)
Basophils Absolute: 0.1 10*3/uL (ref 0.0–0.1)
Basophils Relative: 1 %
Eosinophils Absolute: 0.2 10*3/uL (ref 0.0–0.5)
Eosinophils Relative: 3 %
HCT: 37.8 % (ref 36.0–46.0)
Hemoglobin: 12.5 g/dL (ref 12.0–15.0)
Immature Granulocytes: 0 %
Lymphocytes Relative: 25 %
Lymphs Abs: 1.8 10*3/uL (ref 0.7–4.0)
MCH: 29.6 pg (ref 26.0–34.0)
MCHC: 33.1 g/dL (ref 30.0–36.0)
MCV: 89.4 fL (ref 80.0–100.0)
Monocytes Absolute: 0.6 10*3/uL (ref 0.1–1.0)
Monocytes Relative: 7 %
Neutro Abs: 4.8 10*3/uL (ref 1.7–7.7)
Neutrophils Relative %: 64 %
Platelet Count: 330 10*3/uL (ref 150–400)
RBC: 4.23 MIL/uL (ref 3.87–5.11)
RDW: 13.8 % (ref 11.5–15.5)
WBC Count: 7.4 10*3/uL (ref 4.0–10.5)
nRBC: 0 % (ref 0.0–0.2)

## 2021-09-07 NOTE — Progress Notes (Signed)
Oncology Nurse Navigator Documentation ? ? ?  09/07/2021  ? 11:00 AM 08/31/2021  ? 10:00 AM 08/28/2021  ?  3:00 PM 08/21/2021  ? 12:00 PM  ?Oncology Nurse Navigator Flowsheets  ?Abnormal Finding Date    06/09/2021  ?Confirmed Diagnosis Date    08/23/2021  ?Diagnosis Status   Pending Molecular Studies Additional Work Up  ?Navigator Follow Up Date:   09/07/2021 08/24/2021  ?Navigator Follow Up Reason:   Follow-up After Biopsy Pathology  ?Navigator Location [facility]  ?Navigator Encounter Type Other:  Pathology Review Appt/Treatment Plan Review  ?Patient Visit Type    Other  ?Treatment Phase    Abnormal Scans  ?Barriers/Navigation Needs Coordination of Care Coordination of Care Coordination of Care Coordination of Care  ?Interventions Coordination of Care/I followed up with Dr. Julien Nordmann regarding Ms. Dettinger appt.  Patient is on observation for now. No needs at this time.  Coordination of Care Coordination of Care Coordination of Care  ?Acuity Level 2-Minimal Needs (1-2 Barriers Identified) Level 2-Minimal Needs (1-2 Barriers Identified) Level 2-Minimal Needs (1-2 Barriers Identified) Level 2-Minimal Needs (1-2 Barriers Identified)  ?Coordination of Care  Pathology Pathology Other  ?Time Spent with Patient 15 30 45 30  ?  ?

## 2021-09-07 NOTE — Progress Notes (Signed)
?    [facility] ?[phone]   [phone] ? ?OFFICE PROGRESS NOTE ? ?[name], MD ?[address] ?[place] [address] ? ?DIAGNOSIS: Stage IV (T1b, N0, M1a) low-grade neuroendocrine carcinoma (carcinoid tumor), presented with innumerable bilateral pulmonary nodules diagnosed in March 2023. ? ?PRIOR THERAPY: None ? ?CURRENT THERAPY: Observation ? ?INTERVAL HISTORY: ?Michaela Santana 76 y.o. female returns to the clinic today for follow-up visit accompanied by her daughter Denny Peon.  Her other daughter [name] was available by phone during the visit.  The patient is feeling fine today with no concerning complaints except for being very anxious about her recent biopsy and imaging studies.  She continues to have mild cough and intermittent wheezing but no significant chest pain, shortness of breath or hemoptysis.  She denied having any current fever or chills.  She has no nausea, vomiting, diarrhea or constipation.  She has no headache or visual changes.  She denied having any weight loss or night sweats.  She recently underwent CT-guided core biopsy of a left upper lobe lung nodule as well as MRI of the brain.  She is here today for evaluation and discussion of her treatment options based on the new findings. ? ?MEDICAL HISTORY: ?Past Medical History:  ?Diagnosis Date  ? Anemia   ? Arthritis   ? "probably in hands" (07/17/2016)  ? CKD (chronic kidney disease), stage III ([place])   ? Depression   ? Family history of adverse reaction to anesthesia   ? "her father had some issues when he had his gallbladder taken out when he was in his late 6s"  ? GERD (gastroesophageal reflux disease)   ? Glaucoma   ? open angle, bilateral  ? High cholesterol   ? History of kidney stones   ? Hypertension   ? Lumbago with sciatica, right side   ? Sepsis ([place]) 07/2016  ? d/t flu  ? Type II diabetes mellitus ([place])   ? Vitamin D deficiency   ? ? ?ALLERGIES:  is allergic to codeine and  alprazolam. ? ?MEDICATIONS:  ?Current Outpatient Medications  ?Medication Sig Dispense Refill  ? amLODipine (NORVASC) 5 MG tablet Take 5 mg by mouth daily.    ? atorvastatin (LIPITOR) 10 MG tablet Take 10 mg by mouth daily.    ? Blood Glucose Monitoring Suppl (FREESTYLE LITE) DEVI USE UTD  0  ? DULoxetine HCl 40 MG CPEP Take 40 mg by mouth daily.    ? ferrous sulfate 325 (65 FE) MG EC tablet Take 325 mg by mouth daily.    ? FREESTYLE LITE test strip USE TO CHECK FASTING GLUCOSE IN THE MORNING AND 2 HOURS AFTER DINNER OR LUNCH  3  ? gabapentin (NEURONTIN) 300 MG capsule Take 300 mg by mouth every evening.    ? glipiZIDE (GLUCOTROL XL) 5 MG 24 hr tablet Take 5 mg by mouth daily with breakfast.    ? Lancets (FREESTYLE) lancets     ? lansoprazole (PREVACID) 30 MG capsule Take 30 mg by mouth daily.    ? loratadine (CLARITIN) 10 MG tablet Take 10 mg by mouth daily as needed for allergies.    ? losartan (COZAAR) 100 MG tablet Take 100 mg by mouth daily.    ? magnesium oxide (MAG-OX) 400 (240 Mg) MG tablet Take 1,000 mg by mouth daily. Taking 2.5 tablets starting today 1,000 mg    ? metFORMIN (GLUCOPHAGE-XR) 500 MG 24 hr tablet Take 500-1,000 mg by mouth See admin instructions.  Take 1 tablet ( 500 mg) in the morning and 2 tablets (1000 mg) in the evening    ? Propylene Glycol (SYSTANE BALANCE) 0.6 % SOLN Place 1 drop into both eyes daily as needed (dry eyes).    ? Vitamin D, Ergocalciferol, (DRISDOL) 50000 units CAPS capsule Take 50,000 Units by mouth every Friday.    ? ?No current facility-administered medications for this visit.  ? ? ?SURGICAL HISTORY:  ?Past Surgical History:  ?Procedure Laterality Date  ? BREAST BIOPSY  1990  ? "? side; it wasn't anything"  ? BRONCHIAL BIOPSY  08/01/2021  ? Procedure: BRONCHIAL BIOPSIES;  Surgeon: Garner Nash, DO;  Location: [facility];  Service: Pulmonary;;  ? BRONCHIAL NEEDLE ASPIRATION BIOPSY  08/01/2021  ? Procedure: BRONCHIAL NEEDLE ASPIRATION BIOPSIES;  Surgeon: Garner Nash, DO;  Location: [facility];  Service: Pulmonary;;  ? CATARACT EXTRACTION, BILATERAL  2021  ? CHOLECYSTECTOMY OPEN  1971  ? TUBAL LIGATION  1978  ? VIDEO BRONCHOSCOPY WITH RADIAL ENDOBRONCHIAL ULTRASOUND  08/01/2021  ? Procedure: RADIAL ENDOBRONCHIAL ULTRASOUND;  Surgeon: Garner Nash, DO;  Location: [facility];  Service: Pulmonary;;  ? ? ?REVIEW OF SYSTEMS:  Constitutional: positive for fatigue ?Eyes: negative ?Ears, nose, mouth, throat, and face: negative ?Respiratory: positive for cough and wheezing ?Cardiovascular: negative ?Gastrointestinal: negative ?Genitourinary:negative ?Integument/breast: negative ?Hematologic/lymphatic: negative ?Musculoskeletal:negative ?Neurological: negative ?Behavioral/Psych: negative ?Endocrine: negative ?Allergic/Immunologic: negative  ? ?PHYSICAL EXAMINATION: General appearance: alert, cooperative, fatigued, and no distress ?Head: Normocephalic, without obvious abnormality, atraumatic ?Neck: no adenopathy, no JVD, supple, symmetrical, trachea midline, and thyroid not enlarged, symmetric, no tenderness/mass/nodules ?Lymph nodes: Cervical, supraclavicular, and axillary nodes normal. ?Resp: clear to auscultation bilaterally ?Back: symmetric, no curvature. ROM normal. No CVA tenderness. ?Cardio: regular rate and rhythm, S1, S2 normal, no murmur, click, rub or gallop ?GI: soft, non-tender; bowel sounds normal; no masses,  no organomegaly ?Extremities: extremities normal, atraumatic, no cyanosis or edema ?Neurologic: Alert and oriented X 3, normal strength and tone. Normal symmetric reflexes. Normal coordination and gait ? ?ECOG PERFORMANCE STATUS: 1 - Symptomatic but completely ambulatory ? ?Blood pressure (!) 173/90, pulse 65, temperature 98.8 ?F (37.1 ?C), temperature source Tympanic, weight 174 lb 1.6 oz (79 kg), SpO2 99 %. ? ?LABORATORY DATA: ?Lab Results  ?Component Value Date  ? WBC 7.9 08/23/2021  ? HGB 12.5 08/23/2021  ? HCT 39.3 08/23/2021  ? MCV 91.6 08/23/2021   ? PLT 308 08/23/2021  ? ? ?  Chemistry   ?   ?Component Value Date/Time  ? NA 140 08/18/2021 0918  ? K 4.3 08/18/2021 0918  ? CL 106 08/18/2021 0918  ? CO2 25 08/18/2021 0918  ? BUN 18 08/18/2021 0918  ? CREATININE 0.97 08/18/2021 0918  ?    ?Component Value Date/Time  ? CALCIUM 9.8 08/18/2021 0918  ? ALKPHOS 73 08/18/2021 0918  ? AST 14 (L) 08/18/2021 7846  ? ALT 16 08/18/2021 0918  ? BILITOT 0.5 08/18/2021 9629  ?  ? ? ? ?RADIOGRAPHIC STUDIES: ?MR BRAIN W WO CONTRAST ? ?Result Date: 08/25/2021 ?CLINICAL DATA:  Non-small cell lung cancer (NSCLC), staging EXAM: MRI HEAD WITHOUT AND WITH CONTRAST TECHNIQUE: Multiplanar, multiecho pulse sequences of the brain and surrounding structures were obtained without and with intravenous contrast. CONTRAST:  7.5mL GADAVIST GADOBUTROL 1 MMOL/ML IV SOLN COMPARISON:  None. FINDINGS: Brain: There is no acute infarction or intracranial hemorrhage. There is no intracranial mass, mass effect, or edema. There is no hydrocephalus or extra-axial fluid collection. Prominence of the ventricles and  sulci reflects parenchymal volume loss. Patchy T2 hyperintensity in the supratentorial white matter is nonspecific may reflect mild chronic microvascular ischemic changes. No abnormal enhancement. Vascular: Major vessel flow voids at the skull base are preserved. Skull and upper cervical spine: Normal marrow signal is preserved. Sinuses/Orbits: Paranasal sinuses are aerated. Orbits are unremarkable. Other: Sella is unremarkable.  Mastoid air cells are clear. IMPRESSION: No evidence of intracranial metastatic disease. Electronically Signed   By: Macy Mis M.D.   On: 08/25/2021 05:12  ? ?DG Chest Port 1 View ? ?Result Date: 08/23/2021 ?CLINICAL DATA:  Post left-sided biopsy EXAM: PORTABLE CHEST 1 VIEW COMPARISON:  08/01/2021 FINDINGS: Cardiomegaly. Small focus of post biopsy change in the left midlung. No pneumothorax. The visualized skeletal structures are unremarkable. IMPRESSION: Small focus  of post biopsy change in the left midlung. No pneumothorax. Electronically Signed   By: Delanna Ahmadi M.D.   On: 08/23/2021 12:21  ? ?CT LUNG MASS BIOPSY ? ?Result Date: 08/23/2021 ?INDICATION: Lung cancer with multi

## 2021-09-11 DIAGNOSIS — D224 Melanocytic nevi of scalp and neck: Secondary | ICD-10-CM | POA: Diagnosis not present

## 2021-09-11 DIAGNOSIS — D2239 Melanocytic nevi of other parts of face: Secondary | ICD-10-CM | POA: Diagnosis not present

## 2021-09-11 DIAGNOSIS — D485 Neoplasm of uncertain behavior of skin: Secondary | ICD-10-CM | POA: Diagnosis not present

## 2021-09-11 DIAGNOSIS — D225 Melanocytic nevi of trunk: Secondary | ICD-10-CM | POA: Diagnosis not present

## 2021-09-11 DIAGNOSIS — L814 Other melanin hyperpigmentation: Secondary | ICD-10-CM | POA: Diagnosis not present

## 2021-09-11 DIAGNOSIS — D2271 Melanocytic nevi of right lower limb, including hip: Secondary | ICD-10-CM | POA: Diagnosis not present

## 2021-09-11 DIAGNOSIS — L720 Epidermal cyst: Secondary | ICD-10-CM | POA: Diagnosis not present

## 2021-09-11 DIAGNOSIS — L821 Other seborrheic keratosis: Secondary | ICD-10-CM | POA: Diagnosis not present

## 2021-09-11 DIAGNOSIS — L570 Actinic keratosis: Secondary | ICD-10-CM | POA: Diagnosis not present

## 2021-09-15 ENCOUNTER — Telehealth: Payer: Self-pay | Admitting: Medical Oncology

## 2021-09-15 NOTE — Telephone Encounter (Signed)
09/14/21- ?Mag 1.2 at [facility]  by PCP . ? ?Today ,dtr called to see if pt can get iv magnesium at [facility].  ? ? She said PCP gave her further instructions to increase pt magnesium oxide dose from 1000 mg/day to 1500 mg/day through this weekend. ? ?I told dtr to follow orders from PCP and f/u with PCP and that PCP can order IV mag through the outpt department. Dtr instructed to take pt to ED for  further concerns and to take pt to ED . ? ? ? ?

## 2021-10-06 DIAGNOSIS — Z79899 Other long term (current) drug therapy: Secondary | ICD-10-CM | POA: Diagnosis not present

## 2021-10-06 DIAGNOSIS — R413 Other amnesia: Secondary | ICD-10-CM | POA: Diagnosis not present

## 2021-10-06 DIAGNOSIS — M5441 Lumbago with sciatica, right side: Secondary | ICD-10-CM | POA: Diagnosis not present

## 2021-10-21 DIAGNOSIS — I129 Hypertensive chronic kidney disease with stage 1 through stage 4 chronic kidney disease, or unspecified chronic kidney disease: Secondary | ICD-10-CM | POA: Diagnosis not present

## 2021-10-21 DIAGNOSIS — D649 Anemia, unspecified: Secondary | ICD-10-CM | POA: Diagnosis not present

## 2021-10-21 DIAGNOSIS — E559 Vitamin D deficiency, unspecified: Secondary | ICD-10-CM | POA: Diagnosis not present

## 2021-10-21 DIAGNOSIS — M5441 Lumbago with sciatica, right side: Secondary | ICD-10-CM | POA: Diagnosis not present

## 2021-10-21 DIAGNOSIS — Z9181 History of falling: Secondary | ICD-10-CM | POA: Diagnosis not present

## 2021-10-21 DIAGNOSIS — E7800 Pure hypercholesterolemia, unspecified: Secondary | ICD-10-CM | POA: Diagnosis not present

## 2021-10-21 DIAGNOSIS — M1909 Primary osteoarthritis, other specified site: Secondary | ICD-10-CM | POA: Diagnosis not present

## 2021-10-21 DIAGNOSIS — H9190 Unspecified hearing loss, unspecified ear: Secondary | ICD-10-CM | POA: Diagnosis not present

## 2021-10-21 DIAGNOSIS — Z7984 Long term (current) use of oral hypoglycemic drugs: Secondary | ICD-10-CM | POA: Diagnosis not present

## 2021-10-21 DIAGNOSIS — E1122 Type 2 diabetes mellitus with diabetic chronic kidney disease: Secondary | ICD-10-CM | POA: Diagnosis not present

## 2021-10-21 DIAGNOSIS — H401131 Primary open-angle glaucoma, bilateral, mild stage: Secondary | ICD-10-CM | POA: Diagnosis not present

## 2021-10-21 DIAGNOSIS — M170 Bilateral primary osteoarthritis of knee: Secondary | ICD-10-CM | POA: Diagnosis not present

## 2021-10-21 DIAGNOSIS — N281 Cyst of kidney, acquired: Secondary | ICD-10-CM | POA: Diagnosis not present

## 2021-10-21 DIAGNOSIS — F32A Depression, unspecified: Secondary | ICD-10-CM | POA: Diagnosis not present

## 2021-10-21 DIAGNOSIS — N183 Chronic kidney disease, stage 3 unspecified: Secondary | ICD-10-CM | POA: Diagnosis not present

## 2021-10-21 DIAGNOSIS — E785 Hyperlipidemia, unspecified: Secondary | ICD-10-CM | POA: Diagnosis not present

## 2021-10-26 ENCOUNTER — Emergency Department (HOSPITAL_COMMUNITY): Payer: Medicare Other

## 2021-10-26 ENCOUNTER — Other Ambulatory Visit: Payer: Self-pay

## 2021-10-26 ENCOUNTER — Encounter (HOSPITAL_COMMUNITY): Payer: Self-pay

## 2021-10-26 ENCOUNTER — Observation Stay (HOSPITAL_COMMUNITY): Payer: Medicare Other

## 2021-10-26 ENCOUNTER — Inpatient Hospital Stay (HOSPITAL_COMMUNITY)
Admission: EM | Admit: 2021-10-26 | Discharge: 2021-10-29 | DRG: 071 | Disposition: A | Discharge status: Home Health | Payer: Medicare Other | Attending: Internal Medicine | Admitting: Internal Medicine

## 2021-10-26 DIAGNOSIS — E119 Type 2 diabetes mellitus without complications: Secondary | ICD-10-CM

## 2021-10-26 DIAGNOSIS — Z85118 Personal history of other malignant neoplasm of bronchus and lung: Secondary | ICD-10-CM

## 2021-10-26 DIAGNOSIS — E559 Vitamin D deficiency, unspecified: Secondary | ICD-10-CM | POA: Diagnosis present

## 2021-10-26 DIAGNOSIS — E7800 Pure hypercholesterolemia, unspecified: Secondary | ICD-10-CM | POA: Diagnosis present

## 2021-10-26 DIAGNOSIS — R4587 Impulsiveness: Secondary | ICD-10-CM | POA: Diagnosis present

## 2021-10-26 DIAGNOSIS — F32A Depression, unspecified: Secondary | ICD-10-CM | POA: Diagnosis present

## 2021-10-26 DIAGNOSIS — I10 Essential (primary) hypertension: Secondary | ICD-10-CM | POA: Diagnosis present

## 2021-10-26 DIAGNOSIS — G8929 Other chronic pain: Secondary | ICD-10-CM | POA: Diagnosis present

## 2021-10-26 DIAGNOSIS — N183 Chronic kidney disease, stage 3 unspecified: Secondary | ICD-10-CM | POA: Diagnosis present

## 2021-10-26 DIAGNOSIS — H409 Unspecified glaucoma: Secondary | ICD-10-CM | POA: Diagnosis present

## 2021-10-26 DIAGNOSIS — Z833 Family history of diabetes mellitus: Secondary | ICD-10-CM

## 2021-10-26 DIAGNOSIS — C7802 Secondary malignant neoplasm of left lung: Secondary | ICD-10-CM | POA: Diagnosis present

## 2021-10-26 DIAGNOSIS — C3492 Malignant neoplasm of unspecified part of left bronchus or lung: Secondary | ICD-10-CM | POA: Diagnosis not present

## 2021-10-26 DIAGNOSIS — E538 Deficiency of other specified B group vitamins: Secondary | ICD-10-CM | POA: Diagnosis present

## 2021-10-26 DIAGNOSIS — Z7984 Long term (current) use of oral hypoglycemic drugs: Secondary | ICD-10-CM | POA: Diagnosis not present

## 2021-10-26 DIAGNOSIS — G9340 Encephalopathy, unspecified: Secondary | ICD-10-CM | POA: Diagnosis present

## 2021-10-26 DIAGNOSIS — I441 Atrioventricular block, second degree: Secondary | ICD-10-CM | POA: Diagnosis present

## 2021-10-26 DIAGNOSIS — R531 Weakness: Secondary | ICD-10-CM | POA: Diagnosis present

## 2021-10-26 DIAGNOSIS — D3A8 Other benign neuroendocrine tumors: Secondary | ICD-10-CM | POA: Diagnosis present

## 2021-10-26 DIAGNOSIS — Z91148 Patient's other noncompliance with medication regimen for other reason: Secondary | ICD-10-CM

## 2021-10-26 DIAGNOSIS — G9341 Metabolic encephalopathy: Secondary | ICD-10-CM | POA: Diagnosis present

## 2021-10-26 DIAGNOSIS — E1122 Type 2 diabetes mellitus with diabetic chronic kidney disease: Secondary | ICD-10-CM | POA: Diagnosis present

## 2021-10-26 DIAGNOSIS — Z9049 Acquired absence of other specified parts of digestive tract: Secondary | ICD-10-CM

## 2021-10-26 DIAGNOSIS — Z8249 Family history of ischemic heart disease and other diseases of the circulatory system: Secondary | ICD-10-CM | POA: Diagnosis not present

## 2021-10-26 DIAGNOSIS — Z79899 Other long term (current) drug therapy: Secondary | ICD-10-CM

## 2021-10-26 DIAGNOSIS — I129 Hypertensive chronic kidney disease with stage 1 through stage 4 chronic kidney disease, or unspecified chronic kidney disease: Secondary | ICD-10-CM | POA: Diagnosis present

## 2021-10-26 DIAGNOSIS — R9431 Abnormal electrocardiogram [ECG] [EKG]: Secondary | ICD-10-CM | POA: Diagnosis present

## 2021-10-26 DIAGNOSIS — Z888 Allergy status to other drugs, medicaments and biological substances status: Secondary | ICD-10-CM | POA: Diagnosis not present

## 2021-10-26 DIAGNOSIS — Z885 Allergy status to narcotic agent status: Secondary | ICD-10-CM

## 2021-10-26 DIAGNOSIS — R079 Chest pain, unspecified: Secondary | ICD-10-CM | POA: Diagnosis not present

## 2021-10-26 DIAGNOSIS — R410 Disorientation, unspecified: Secondary | ICD-10-CM | POA: Diagnosis not present

## 2021-10-26 DIAGNOSIS — R4182 Altered mental status, unspecified: Secondary | ICD-10-CM | POA: Diagnosis not present

## 2021-10-26 DIAGNOSIS — M5441 Lumbago with sciatica, right side: Secondary | ICD-10-CM | POA: Diagnosis present

## 2021-10-26 DIAGNOSIS — K219 Gastro-esophageal reflux disease without esophagitis: Secondary | ICD-10-CM | POA: Diagnosis present

## 2021-10-26 DIAGNOSIS — N182 Chronic kidney disease, stage 2 (mild): Secondary | ICD-10-CM | POA: Diagnosis not present

## 2021-10-26 DIAGNOSIS — M6281 Muscle weakness (generalized): Secondary | ICD-10-CM | POA: Diagnosis not present

## 2021-10-26 DIAGNOSIS — R69 Illness, unspecified: Secondary | ICD-10-CM

## 2021-10-26 DIAGNOSIS — M1990 Unspecified osteoarthritis, unspecified site: Secondary | ICD-10-CM | POA: Diagnosis present

## 2021-10-26 LAB — BLOOD GAS, ARTERIAL
Acid-base deficit: 0.7 mmol/L (ref 0.0–2.0)
Bicarbonate: 23.2 mmol/L (ref 20.0–28.0)
O2 Saturation: 95.3 %
Patient temperature: 37.2
pCO2 arterial: 35 mmHg (ref 32–48)
pH, Arterial: 7.43 (ref 7.35–7.45)
pO2, Arterial: 82 mmHg — ABNORMAL LOW (ref 83–108)

## 2021-10-26 LAB — PROTIME-INR
INR: 0.9 (ref 0.8–1.2)
Prothrombin Time: 12.2 seconds (ref 11.4–15.2)

## 2021-10-26 LAB — CBC WITH DIFFERENTIAL/PLATELET
Abs Immature Granulocytes: 0.01 10*3/uL (ref 0.00–0.07)
Basophils Absolute: 0.1 10*3/uL (ref 0.0–0.1)
Basophils Relative: 1 %
Eosinophils Absolute: 0.2 10*3/uL (ref 0.0–0.5)
Eosinophils Relative: 3 %
HCT: 37.5 % (ref 36.0–46.0)
Hemoglobin: 12.5 g/dL (ref 12.0–15.0)
Immature Granulocytes: 0 %
Lymphocytes Relative: 18 %
Lymphs Abs: 1 10*3/uL (ref 0.7–4.0)
MCH: 31.5 pg (ref 26.0–34.0)
MCHC: 33.3 g/dL (ref 30.0–36.0)
MCV: 94.5 fL (ref 80.0–100.0)
Monocytes Absolute: 0.6 10*3/uL (ref 0.1–1.0)
Monocytes Relative: 11 %
Neutro Abs: 3.5 10*3/uL (ref 1.7–7.7)
Neutrophils Relative %: 67 %
Platelets: 224 10*3/uL (ref 150–400)
RBC: 3.97 MIL/uL (ref 3.87–5.11)
RDW: 14.2 % (ref 11.5–15.5)
WBC: 5.3 10*3/uL (ref 4.0–10.5)
nRBC: 0 % (ref 0.0–0.2)

## 2021-10-26 LAB — URINALYSIS, ROUTINE W REFLEX MICROSCOPIC
Bilirubin Urine: NEGATIVE
Glucose, UA: NEGATIVE mg/dL
Hgb urine dipstick: NEGATIVE
Ketones, ur: NEGATIVE mg/dL
Leukocytes,Ua: NEGATIVE
Nitrite: NEGATIVE
Protein, ur: 30 mg/dL — AB
Specific Gravity, Urine: 1.032 — ABNORMAL HIGH (ref 1.005–1.030)
pH: 6 (ref 5.0–8.0)

## 2021-10-26 LAB — TROPONIN I (HIGH SENSITIVITY)
Troponin I (High Sensitivity): 16 ng/L (ref <18)
Troponin I (High Sensitivity): 14 ng/L (ref <18)

## 2021-10-26 LAB — BLOOD GAS, VENOUS
Acid-Base Excess: 1.3 mmol/L (ref 0.0–2.0)
Bicarbonate: 27.2 mmol/L (ref 20.0–28.0)
O2 Saturation: 58.2 %
Patient temperature: 37
pCO2, Ven: 47 mmHg (ref 44–60)
pH, Ven: 7.37 (ref 7.25–7.43)
pO2, Ven: <31 mmHg — CL (ref 32–45)

## 2021-10-26 LAB — COMPREHENSIVE METABOLIC PANEL
ALT: 16 U/L (ref 0–44)
AST: 16 U/L (ref 15–41)
Albumin: 3.8 g/dL (ref 3.5–5.0)
Alkaline Phosphatase: 73 U/L (ref 38–126)
Anion gap: 6 (ref 5–15)
BUN: 19 mg/dL (ref 8–23)
CO2: 26 mmol/L (ref 22–32)
Calcium: 9.2 mg/dL (ref 8.9–10.3)
Chloride: 104 mmol/L (ref 98–111)
Creatinine, Ser: 0.99 mg/dL (ref 0.44–1.00)
GFR, Estimated: 60 mL/min — ABNORMAL LOW (ref >60)
Glucose, Bld: 156 mg/dL — ABNORMAL HIGH (ref 70–99)
Potassium: 3.8 mmol/L (ref 3.5–5.1)
Sodium: 136 mmol/L (ref 135–145)
Total Bilirubin: 0.8 mg/dL (ref 0.3–1.2)
Total Protein: 6.5 g/dL (ref 6.5–8.1)

## 2021-10-26 LAB — RAPID URINE DRUG SCREEN, HOSP PERFORMED
Amphetamines: NOT DETECTED
Barbiturates: NOT DETECTED
Benzodiazepines: NOT DETECTED
Cocaine: NOT DETECTED
Opiates: NOT DETECTED
Tetrahydrocannabinol: NOT DETECTED

## 2021-10-26 LAB — PHOSPHORUS: Phosphorus: 3.2 mg/dL (ref 2.5–4.6)

## 2021-10-26 LAB — ACETAMINOPHEN LEVEL: Acetaminophen (Tylenol), Serum: <10 ug/mL — ABNORMAL LOW (ref 10–30)

## 2021-10-26 LAB — LACTIC ACID, PLASMA
Lactic Acid, Venous: 1.9 mmol/L (ref 0.5–1.9)
Lactic Acid, Venous: 1.4 mmol/L (ref 0.5–1.9)

## 2021-10-26 LAB — AMMONIA: Ammonia: 14 umol/L (ref 9–35)

## 2021-10-26 LAB — MAGNESIUM: Magnesium: 1.8 mg/dL (ref 1.7–2.4)

## 2021-10-26 LAB — TSH: TSH: 1.819 u[IU]/mL (ref 0.350–4.500)

## 2021-10-26 LAB — SALICYLATE LEVEL: Salicylate Lvl: <7 mg/dL — ABNORMAL LOW (ref 7.0–30.0)

## 2021-10-26 LAB — GLUCOSE, CAPILLARY: Glucose-Capillary: 130 mg/dL — ABNORMAL HIGH (ref 70–99)

## 2021-10-26 IMAGING — MR MR HEAD W/O CM
5 series · 48 of 48 positions shown · non-contrast
Comparison: [DATE]

CLINICAL DATA: Generalized weakness.  History of lung carcinoma.

EXAM:
MRI HEAD WITHOUT CONTRAST
TECHNIQUE: Multiplanar, multiecho pulse sequences of the brain and surrounding
structures were obtained without intravenous contrast.

[Series 5: DWI · axial · 3.0mm · 1.36mm/px · z∈[-90,+62]mm · 19 of 104 slices shown (1 of 2)]
[im 1/104]
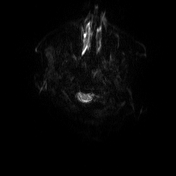
[im 6/104]
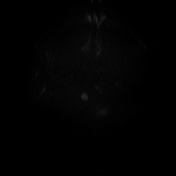
[im 12/104]
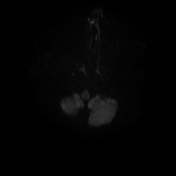
[im 18/104]
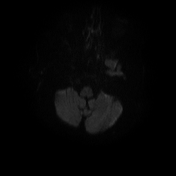
[im 23/104]
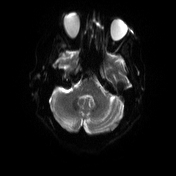
[im 29/104]
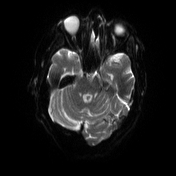
[im 35/104]
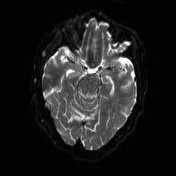
[im 41/104]
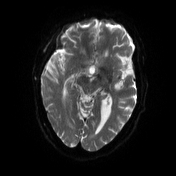
[im 46/104]
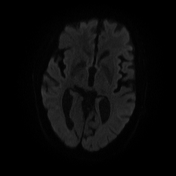
[im 52/104]
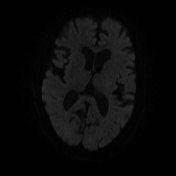
[im 58/104]
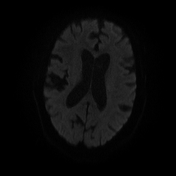
[im 63/104]
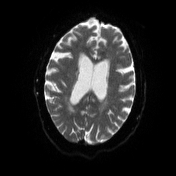
[im 69/104]
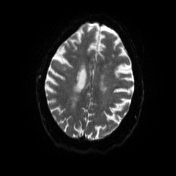
[im 75/104]
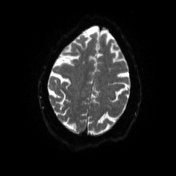
[im 81/104]
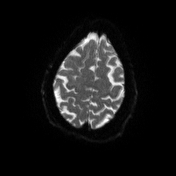
[im 86/104]
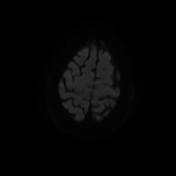
[im 92/104]
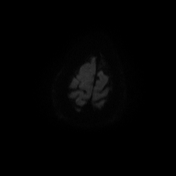
[im 98/104]
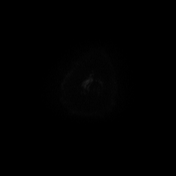
[im 104/104]
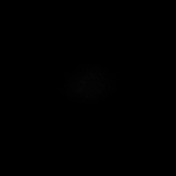

[Series 6: DWI · axial · 3.0mm · 1.36mm/px · z∈[-90,+62]mm · 10 of 52 slices shown (2 of 2)]
[im 1/52]
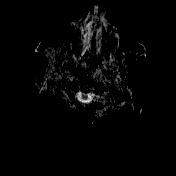
[im 6/52]
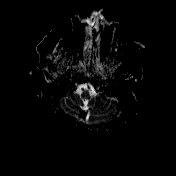
[im 12/52]
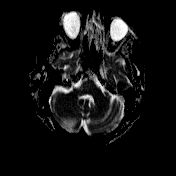
[im 18/52]
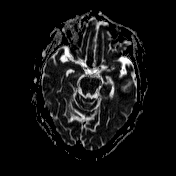
[im 23/52]
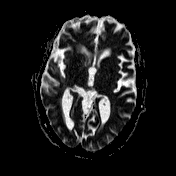
[im 29/52]
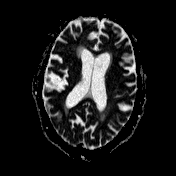
[im 35/52]
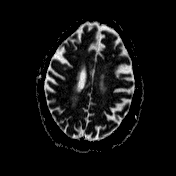
[im 40/52]
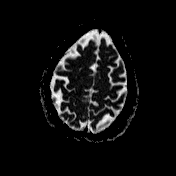
[im 46/52]
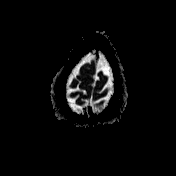
[im 52/52]
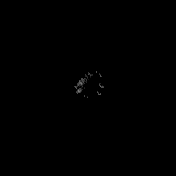

[Series 7: T1 · sagittal · 5.0mm · 0.75mm/px · 4 of 24 slices shown]
[im 1/24]
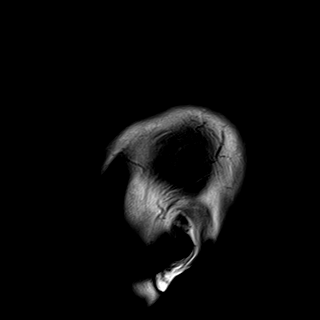
[im 8/24]
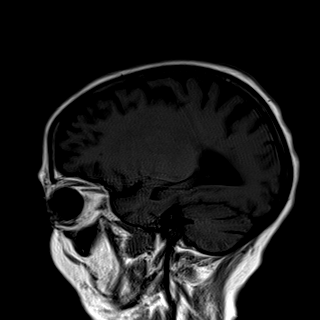
[im 16/24]
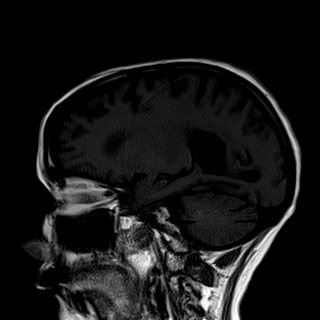
[im 24/24]
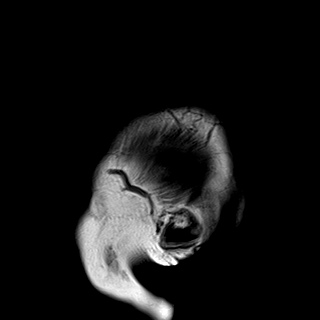

[Series 8: T2 · axial · 5.0mm · 0.62mm/px · z∈[-95,+66]mm · 5 of 26 slices shown]
[im 1/26]
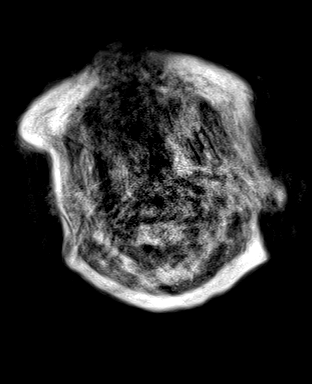
[im 7/26]
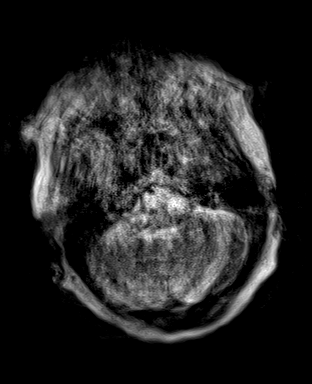
[im 13/26]
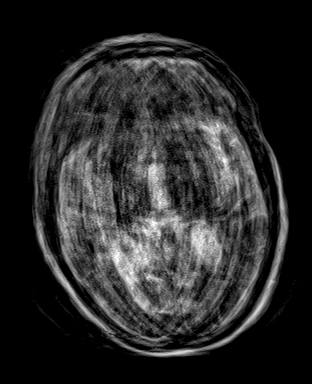
[im 19/26]
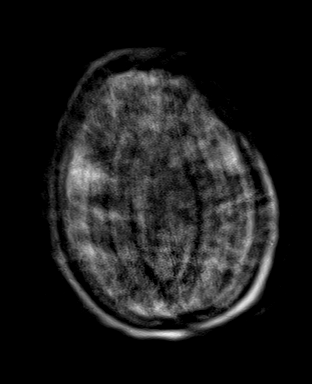
[im 26/26]
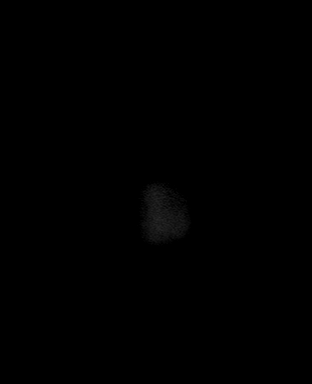

[Series 9: FLAIR · axial · 3.0mm · 0.75mm/px · z∈[-90,+62]mm · 10 of 52 slices shown]
[im 1/52]
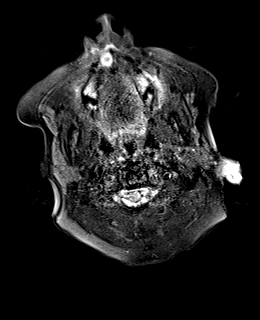
[im 6/52]
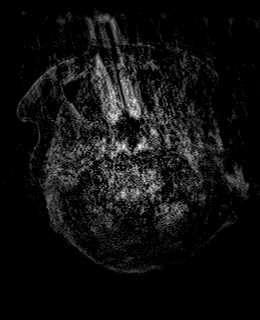
[im 12/52]
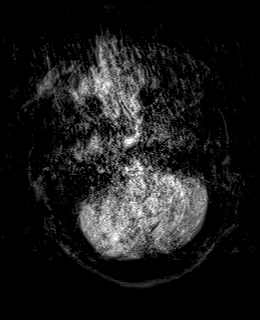
[im 18/52]
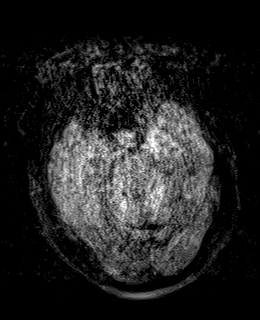
[im 23/52]
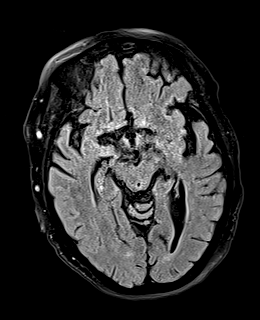
[im 29/52]
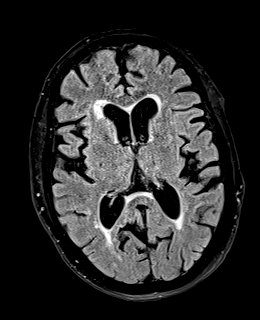
[im 35/52]
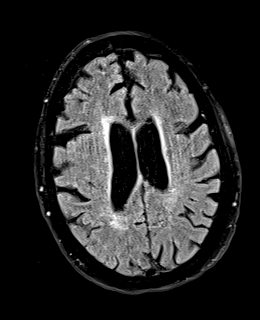
[im 40/52]
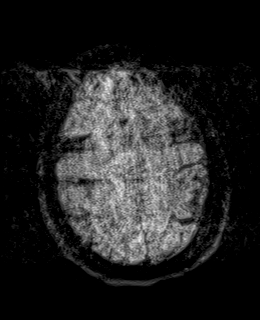
[im 46/52]
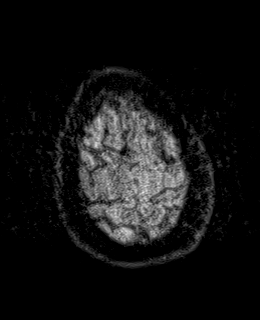
[im 52/52]
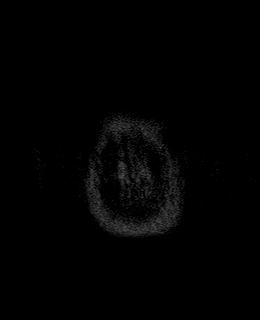

[48 of 48 positions shown; findings below may reference images not displayed]

FINDINGS: Examination is degraded by motion.

Brain: No acute infarct, mass effect or extra-axial collection.
There is multifocal hyperintense T2-weighted signal within the white
matter. Generalized cerebral volume loss. The midline structures are
normal.

Vascular: Major flow voids are preserved.

Skull and upper cervical spine: Normal calvarium and skull base.
Visualized upper cervical spine and soft tissues are normal.

Sinuses/Orbits:No paranasal sinus fluid levels or advanced mucosal
thickening. No mastoid or middle ear effusion. Normal orbits.
IMPRESSION: 1. Motion degraded study without acute intracranial abnormality.
2. Findings of chronic microvascular ischemia and cerebral volume
loss.

## 2021-10-26 IMAGING — CT CT ANGIO CHEST
2 of 7 series · 17 of 46 positions shown · IV contrast (agent unspecified)
Comparison: [DATE]

CLINICAL DATA: Chest pain and generalized weakness for several
weeks. History of lung cancer.

EXAM:
CT ANGIOGRAPHY CHEST WITH CONTRAST
TECHNIQUE: Multidetector CT imaging of the chest was performed using the
standard protocol during bolus administration of intravenous
contrast. Multiplanar CT image reconstructions and MIPs were
obtained to evaluate the vascular anatomy.

[Series 5: thins · axial · 0.74mm/px · z∈[-493,-247]mm · 15 of 282 slices shown]
[im 18/282  lung]
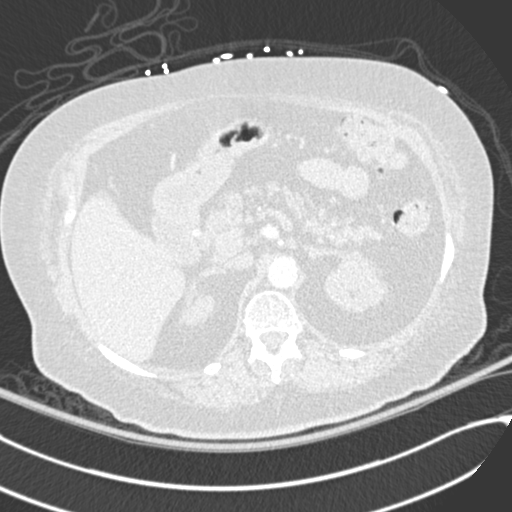
[im 36/282  soft-tissue]
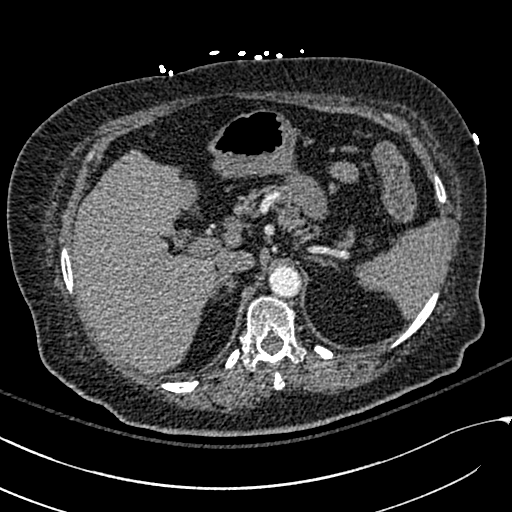
[im 53/282  lung]
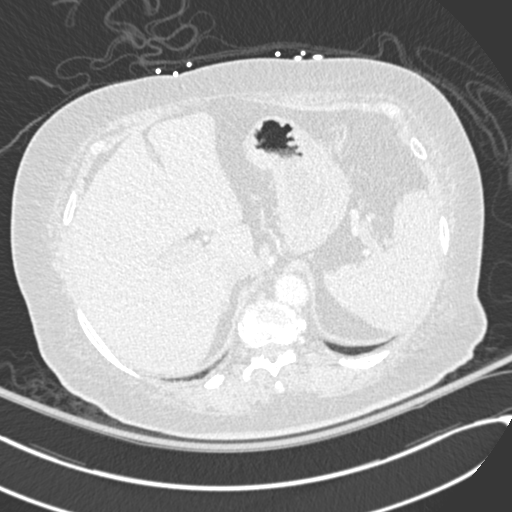
[im 71/282  soft-tissue]
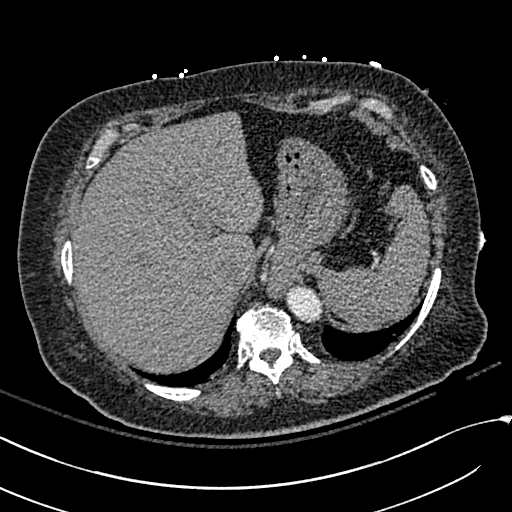
[im 88/282  lung]
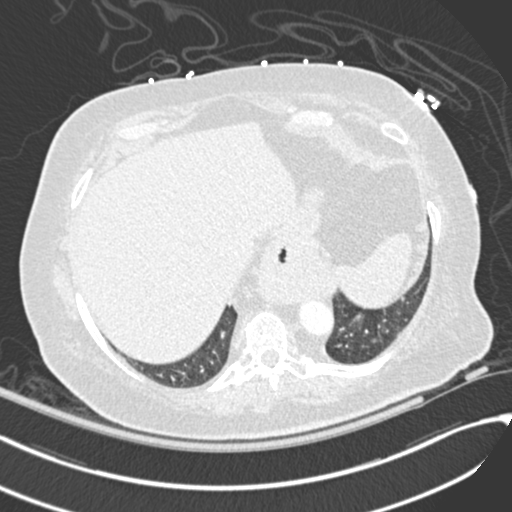
[im 106/282  soft-tissue]
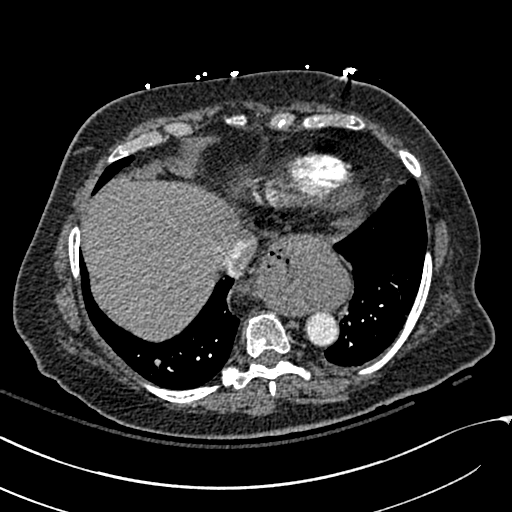
[im 123/282  lung]
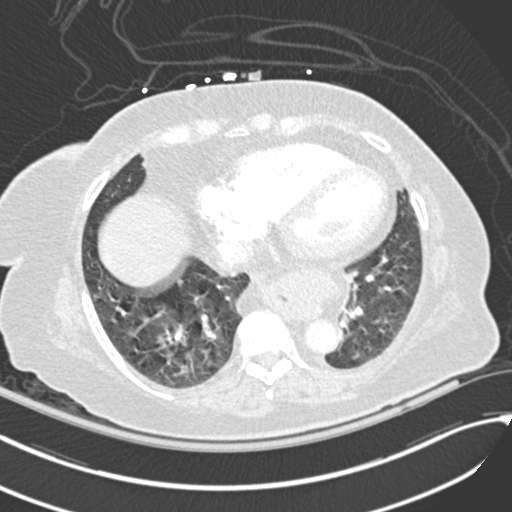
[im 141/282  soft-tissue]
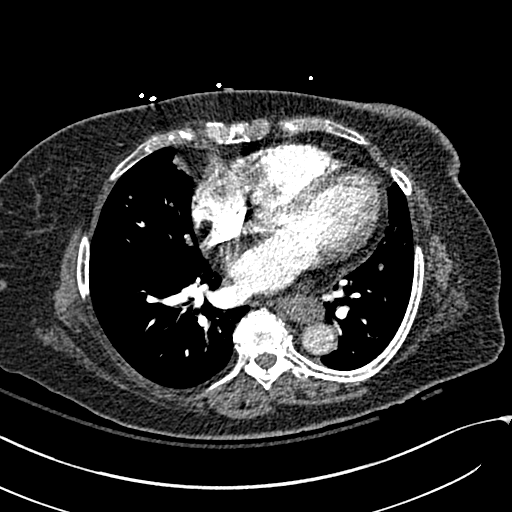
[im 159/282  lung]
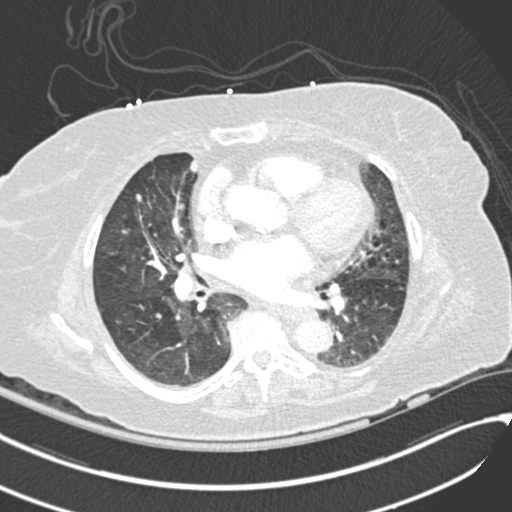
[im 176/282  soft-tissue]
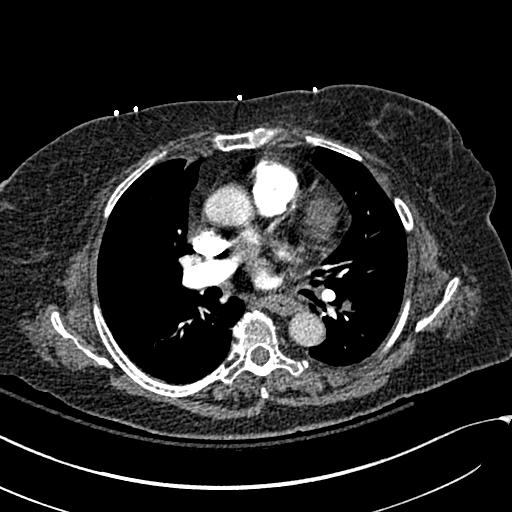
[im 194/282  lung]
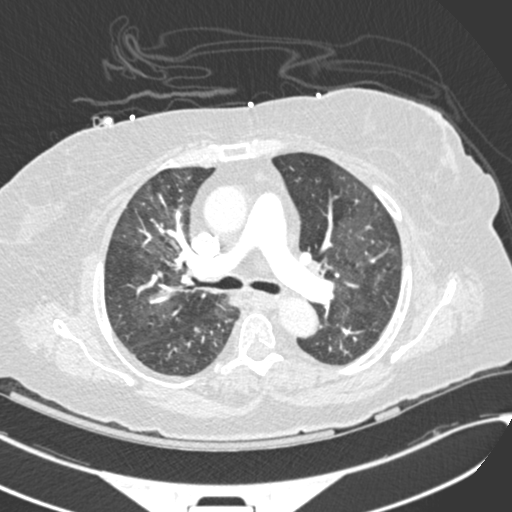
[im 211/282  soft-tissue]
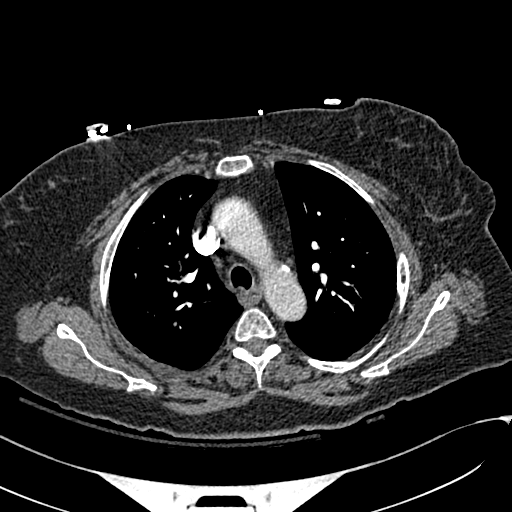
[im 229/282  lung]
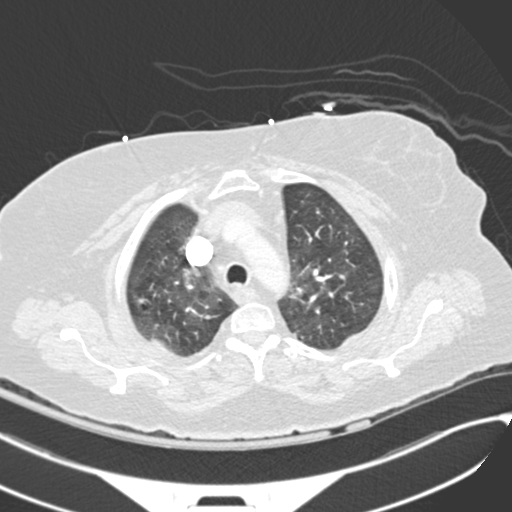
[im 246/282  soft-tissue]
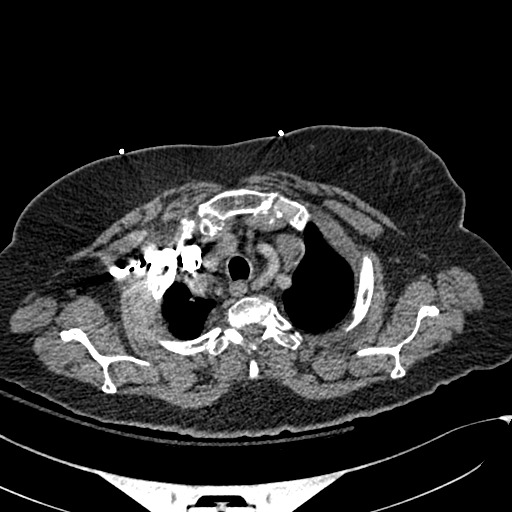
[im 264/282  lung]
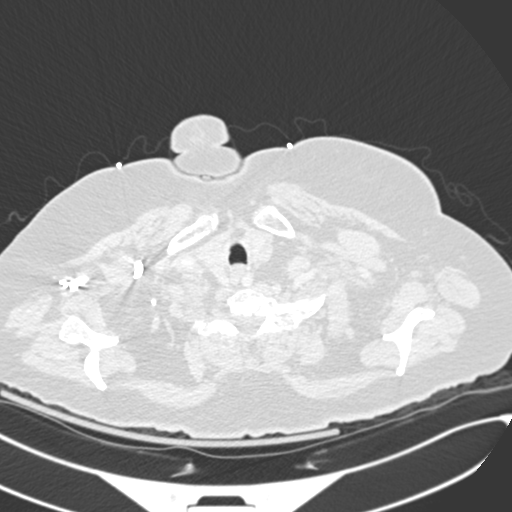

[Series 7: coronal mpr · coronal · 0.60mm/px · 2 of 96 slices shown]
[im 32/96  soft-tissue]
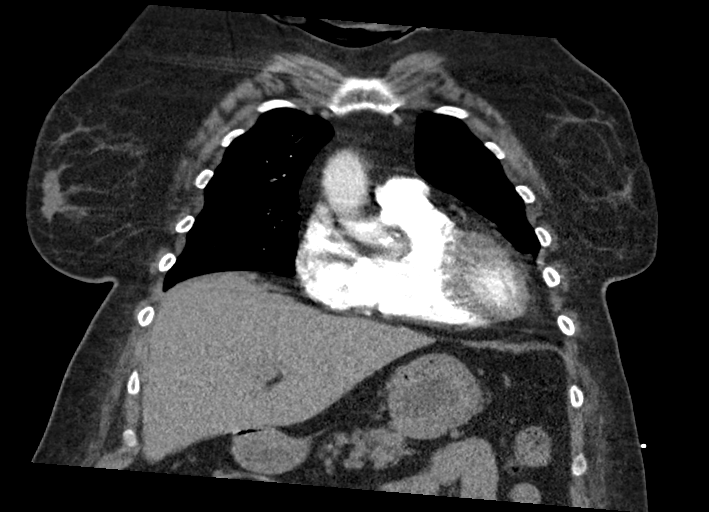
[im 64/96  soft-tissue]
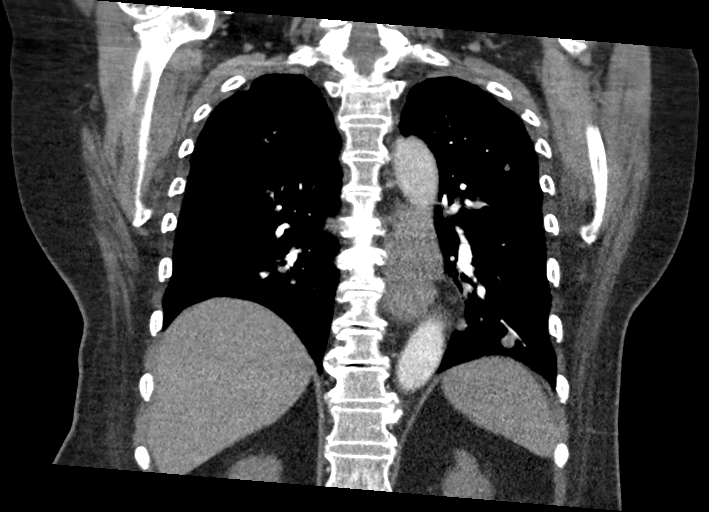

[17 of 46 positions shown; findings below may reference images not displayed]

RADIATION DOSE REDUCTION: This exam was performed according to the
departmental dose-optimization program which includes automated
exposure control, adjustment of the mA and/or kV according to
patient size and/or use of iterative reconstruction technique.

CONTRAST:  75mL OMNIPAQUE IOHEXOL 350 MG/ML SOLN
FINDINGS: Cardiovascular: The heart is normal in size. No pericardial
effusion. The aorta is normal in caliber. No dissection. Scattered
atherosclerotic calcification. No obvious coronary artery
calcifications.

The pulmonary arterial tree is well opacified. No filling defects to
suggest pulmonary embolism.

Mediastinum/Nodes: No mediastinal or hilar mass or lymphadenopathy.
Small scattered lymph nodes are stable. Stable large hiatal hernia.

Lungs/Pleura: Persistent and perhaps slightly more pronounced mosaic
pattern of ground-glass attenuation which could be due to small
airways disease such as asthma, respiratory bronchiolitis, other
reactive airways disease, hypersensitivity pneumonitis or
cryptogenic organizing pneumonia. No focal pulmonary infiltrates or
pleural effusions.

Persistent diffuse pulmonary metastatic disease. The larger nodules
are unchanged. 12 mm left upper lobe nodule on image 41/6 is stable.
11 mm left lower lobe nodule on image number 52/6 is stable. 9 mm
left lower lobe nodule on image number 94/6 is stable. No new or
progressive findings are identified.

Upper Abdomen: No significant upper abdominal findings

Musculoskeletal: No breast masses, supraclavicular or axillary
adenopathy. The bony thorax is intact.

Review of the MIP images confirms the above findings.
IMPRESSION: 1. No CT findings for pulmonary embolism.
2. Normal caliber thoracic aorta.  No dissection.
3. Persistent and slightly progressive mosaic pattern of
ground-glass attenuation which could be due to small airways disease
such as asthma, respiratory bronchiolitis, other reactive airways
disease, hypersensitivity pneumonitis or cryptogenic organizing
pneumonia.
4. Stable diffuse pulmonary metastatic disease.
5. No mediastinal or hilar mass or adenopathy.
6. Large hiatal hernia.
7. Aortic atherosclerosis.

Aortic Atherosclerosis ([UH]-[UH]).

## 2021-10-26 IMAGING — DX DG CHEST 1V PORT
1 series · 1 of 1 positions shown · non-contrast
Comparison: [DATE]

CLINICAL DATA: Generalized weakness

EXAM:
PORTABLE CHEST 1 VIEW

[chest ap]
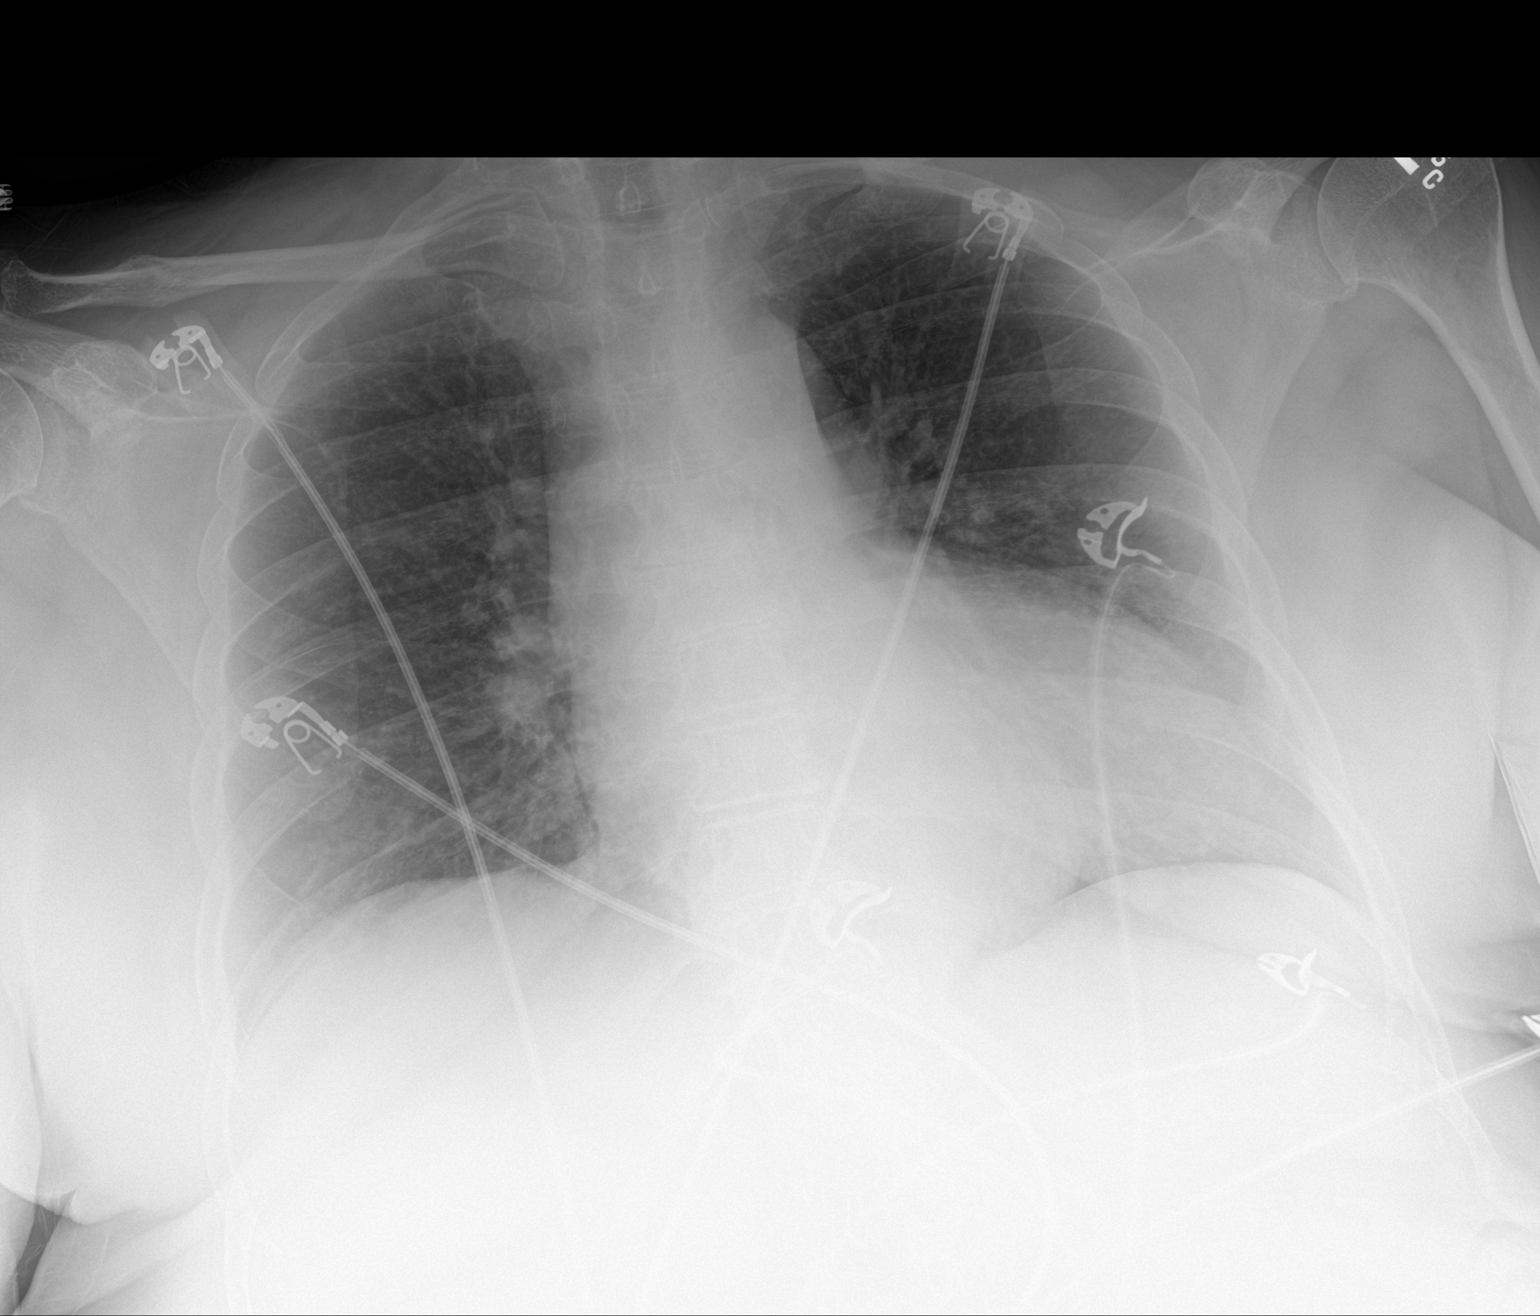

[1 of 1 positions shown; findings below may reference images not displayed]

FINDINGS: No focal consolidation. No pleural effusion or pneumothorax. Stable
cardiomegaly.

Large hiatal hernia.

No acute osseous abnormality.
IMPRESSION: 1. No acute cardiopulmonary disease.
2. Large hiatal hernia.

## 2021-10-26 IMAGING — CT CT HEAD W/O CM
4 of 5 series · 15 of 47 positions shown, 17 images · non-contrast
Comparison: Head MRI [DATE]

CLINICAL DATA: Mental status change, unknown cause. Generalized
weakness. History of lung cancer.



[Series 2: head wo · axial · 0.46mm/px · z∈[-96,-6]mm · 4 of 32 slices shown (1 of 2)]
[im 7/32  brain]
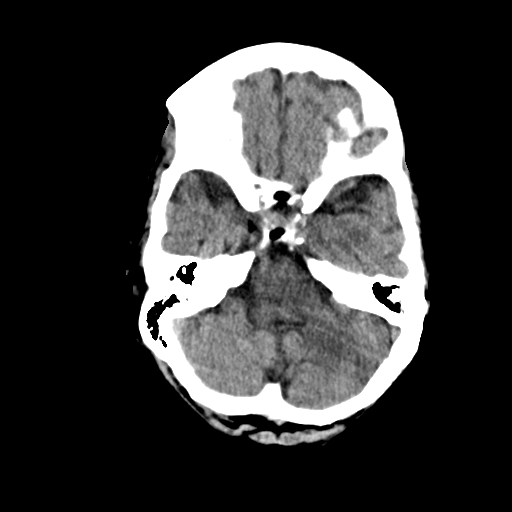
[im 13/32  brain]
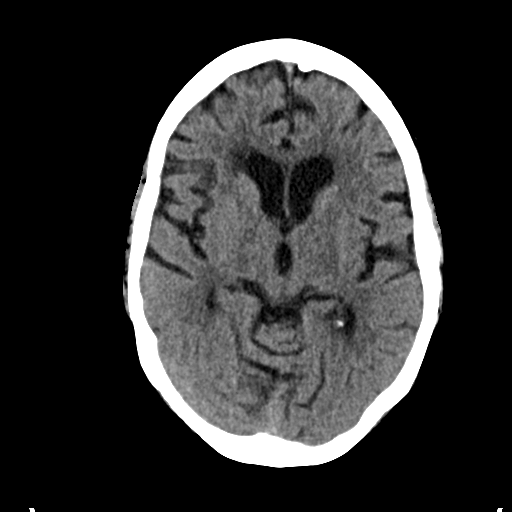
[im 19/32  brain]
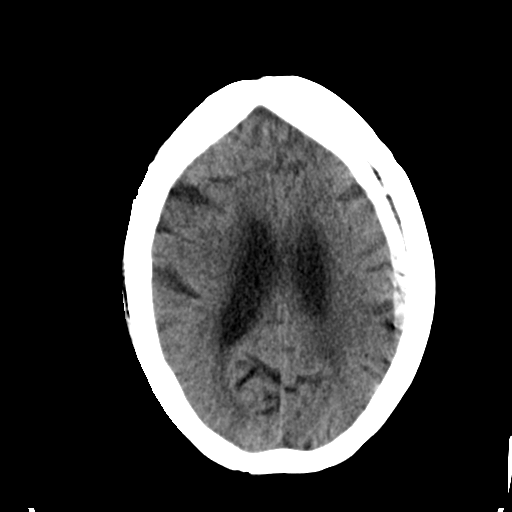
[im 25/32  brain]
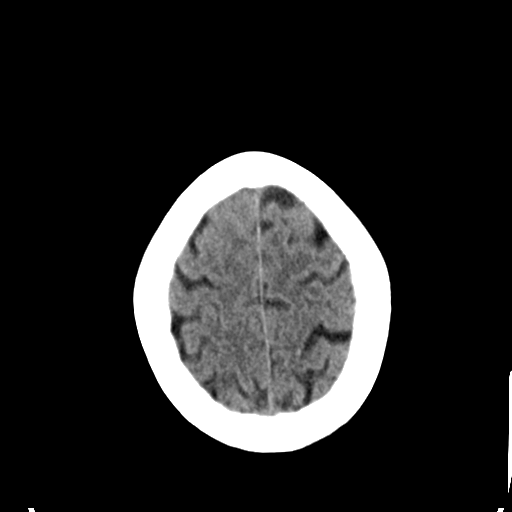

[Series 3: head wo · axial · 0.46mm/px · z∈[-101,-1]mm · 5 of 32 slices shown, 7 images (2 of 2)]
[im 6/32  brain]
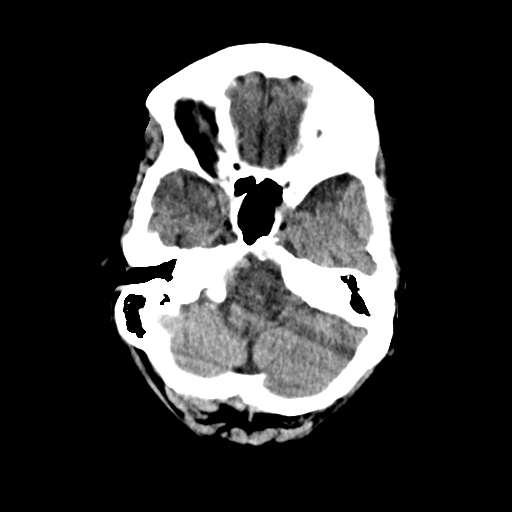
[im 6/32  bone]
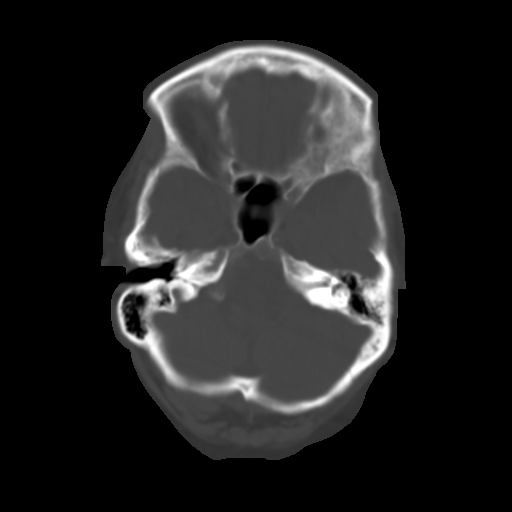
[im 11/32  brain]
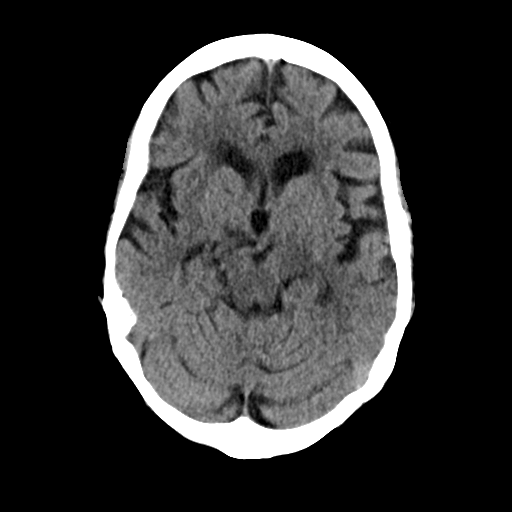
[im 16/32  brain]
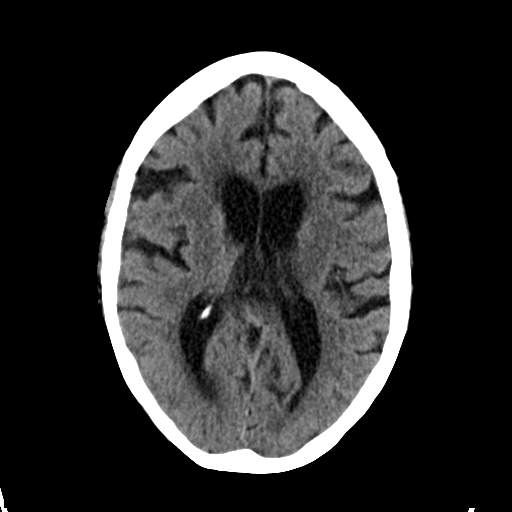
[im 21/32  brain]
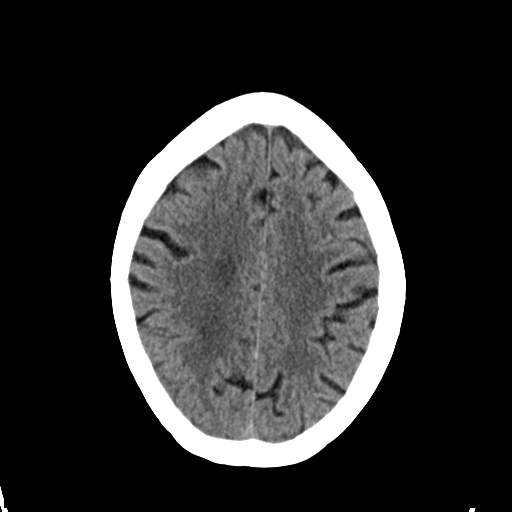
[im 26/32  brain]
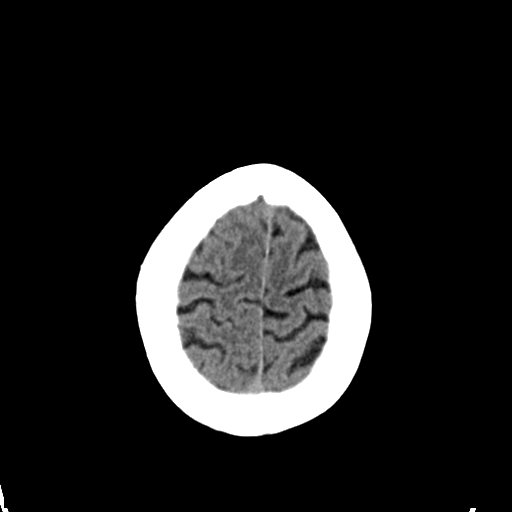
[im 26/32  bone]
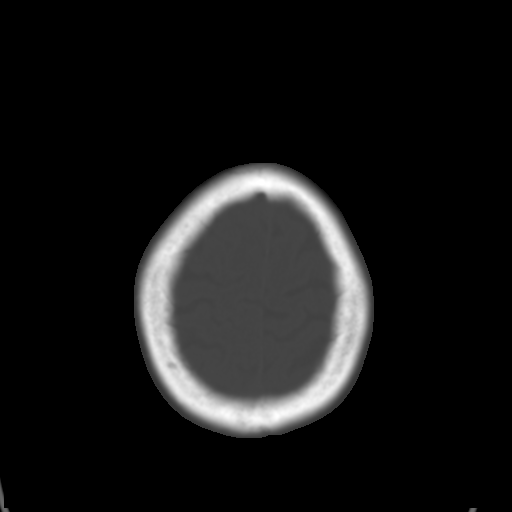

[Series 5: coronal soft tissue · coronal · 0.33mm/px · 3 of 77 slices shown]
[im 26/77  brain]
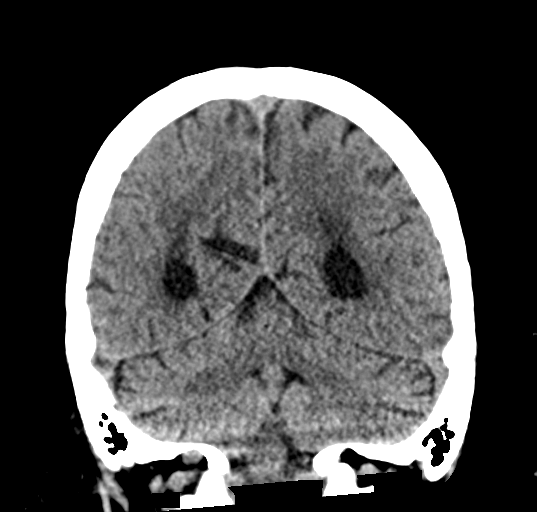
[im 34/77  brain]
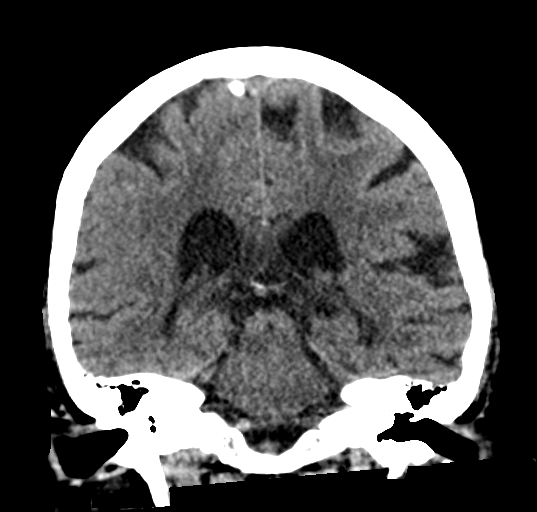
[im 43/77  brain]
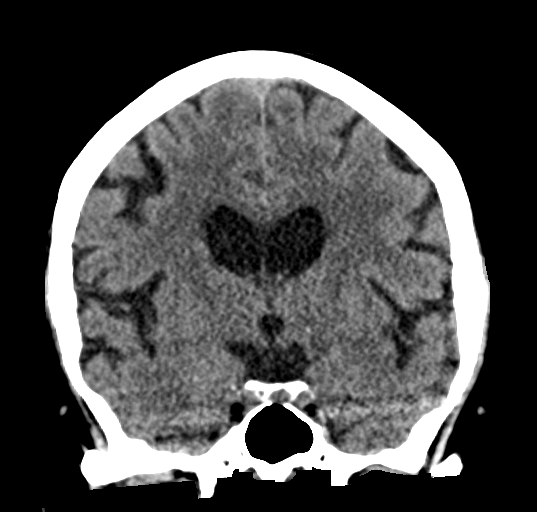

[Series 6: sagittal soft tissue · sagittal · 0.33mm/px · 3 of 58 slices shown]
[im 20/58  brain]
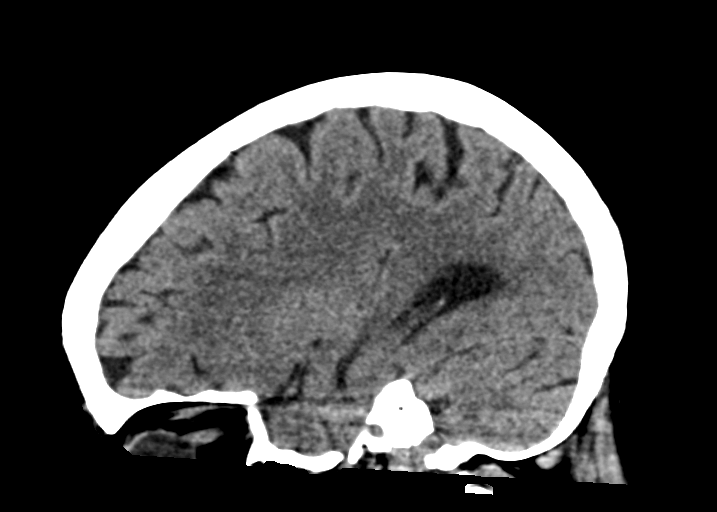
[im 29/58  brain]
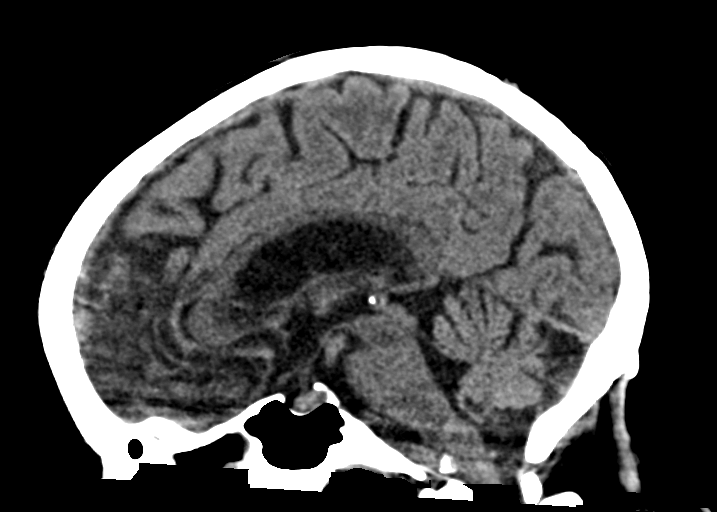
[im 39/58  brain]
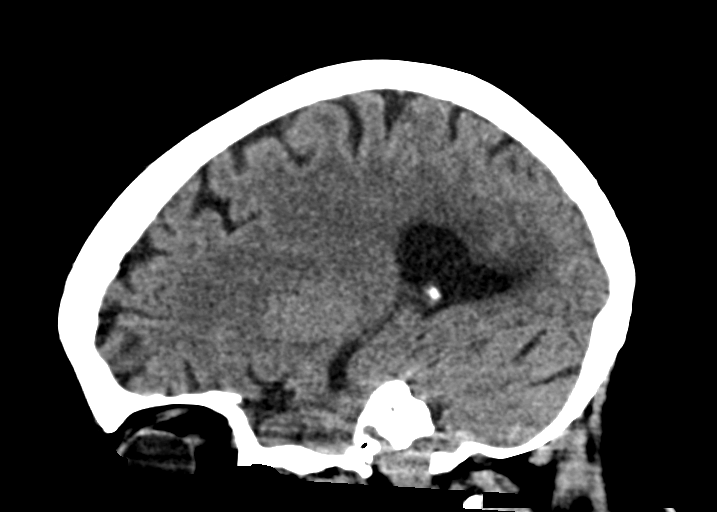

[15 of 47 positions shown; findings below may reference images not displayed]

FINDINGS: Brain: There is no evidence of an acute infarct, intracranial
hemorrhage, mass, midline shift, or extra-axial fluid collection.
There is mild generalized cerebral atrophy. Hypodensities in the
cerebral white matter bilaterally are nonspecific but compatible
with mild chronic small vessel ischemic disease.

Vascular: Calcified atherosclerosis at the skull base. No hyperdense
vessel.

Skull: No fracture or suspicious osseous lesion.

Sinuses/Orbits: Mild left-sided ethmoid air cell mucosal thickening.
Clear mastoid air cells. Bilateral cataract extraction.

Other: None.
IMPRESSION: 1. No evidence of acute intracranial abnormality.
2. Mild chronic small vessel ischemic disease.

## 2021-10-26 MED ORDER — POTASSIUM CHLORIDE 10 MEQ/100ML IV SOLN
10.0000 meq | INTRAVENOUS | Status: AC
  Administered 2021-10-27 (×2): 10 meq via INTRAVENOUS
  Filled 2021-10-26 (×2): qty 100

## 2021-10-26 MED ORDER — FAMOTIDINE 20 MG PO TABS
20.0000 mg | ORAL_TABLET | Freq: Every day | ORAL | Status: DC
  Administered 2021-10-27 – 2021-10-29 (×3): 20 mg via ORAL
  Filled 2021-10-26 (×3): qty 1

## 2021-10-26 MED ORDER — AMLODIPINE BESYLATE 5 MG PO TABS
5.0000 mg | ORAL_TABLET | Freq: Every day | ORAL | Status: DC
  Administered 2021-10-27 – 2021-10-29 (×3): 5 mg via ORAL
  Filled 2021-10-26 (×4): qty 1

## 2021-10-26 MED ORDER — SODIUM CHLORIDE 0.9 % IV SOLN: Freq: Once | INTRAVENOUS | Status: AC

## 2021-10-26 MED ORDER — INSULIN ASPART 100 UNIT/ML IJ SOLN: 0.0000 [IU] | Freq: Every day | INTRAMUSCULAR | Status: DC

## 2021-10-26 MED ORDER — ONDANSETRON HCL 4 MG/2ML IJ SOLN
INTRAMUSCULAR | Status: AC
Start: 2021-10-26 — End: 2021-10-26
  Administered 2021-10-26: 4 mg via INTRAVENOUS
  Filled 2021-10-26: qty 2

## 2021-10-26 MED ORDER — LACTATED RINGERS IV SOLN: INTRAVENOUS | Status: AC

## 2021-10-26 MED ORDER — ACETAMINOPHEN 325 MG PO TABS
650.0000 mg | ORAL_TABLET | Freq: Four times a day (QID) | ORAL | Status: DC | PRN
  Administered 2021-10-27 – 2021-10-28 (×2): 650 mg via ORAL
  Filled 2021-10-26 (×2): qty 2

## 2021-10-26 MED ORDER — GABAPENTIN 300 MG PO CAPS
300.0000 mg | ORAL_CAPSULE | Freq: Every evening | ORAL | Status: DC
  Administered 2021-10-27 – 2021-10-28 (×2): 300 mg via ORAL
  Filled 2021-10-26 (×2): qty 1

## 2021-10-26 MED ORDER — ONDANSETRON HCL 4 MG/2ML IJ SOLN: 4.0000 mg | Freq: Once | INTRAMUSCULAR | Status: AC

## 2021-10-26 MED ORDER — ACETAMINOPHEN 650 MG RE SUPP: 650.0000 mg | Freq: Four times a day (QID) | RECTAL | Status: DC | PRN

## 2021-10-26 MED ORDER — ENOXAPARIN SODIUM 40 MG/0.4ML IJ SOSY
40.0000 mg | PREFILLED_SYRINGE | INTRAMUSCULAR | Status: DC
  Administered 2021-10-27 – 2021-10-29 (×3): 40 mg via SUBCUTANEOUS
  Filled 2021-10-26 (×3): qty 0.4

## 2021-10-26 MED ORDER — ATORVASTATIN CALCIUM 10 MG PO TABS
10.0000 mg | ORAL_TABLET | Freq: Every day | ORAL | Status: DC
  Administered 2021-10-27 – 2021-10-29 (×3): 10 mg via ORAL
  Filled 2021-10-26 (×3): qty 1

## 2021-10-26 MED ORDER — IOHEXOL 350 MG/ML SOLN
75.0000 mL | Freq: Once | INTRAVENOUS | Status: AC | PRN
Start: 2021-10-26 — End: 2021-10-26
  Administered 2021-10-26: 75 mL via INTRAVENOUS

## 2021-10-26 MED ORDER — INSULIN ASPART 100 UNIT/ML IJ SOLN
0.0000 [IU] | Freq: Three times a day (TID) | INTRAMUSCULAR | Status: DC
  Administered 2021-10-27 (×2): 1 [IU] via SUBCUTANEOUS
  Administered 2021-10-28: 2 [IU] via SUBCUTANEOUS
  Administered 2021-10-28 – 2021-10-29 (×2): 1 [IU] via SUBCUTANEOUS
  Administered 2021-10-29: 2 [IU] via SUBCUTANEOUS

## 2021-10-26 MED ORDER — DULOXETINE HCL 20 MG PO CPEP
40.0000 mg | ORAL_CAPSULE | Freq: Every day | ORAL | Status: DC
  Administered 2021-10-27 – 2021-10-29 (×3): 40 mg via ORAL
  Filled 2021-10-26 (×4): qty 2

## 2021-10-26 MED ORDER — SENNOSIDES-DOCUSATE SODIUM 8.6-50 MG PO TABS: 1.0000 | ORAL_TABLET | Freq: Every evening | ORAL | Status: DC | PRN

## 2021-10-26 MED ORDER — GADOBUTROL 1 MMOL/ML IV SOLN: 8.0000 mL | Freq: Once | INTRAVENOUS | Status: DC | PRN

## 2021-10-26 MED ORDER — MAGNESIUM SULFATE 2 GM/50ML IV SOLN
2.0000 g | Freq: Once | INTRAVENOUS | Status: AC
  Administered 2021-10-27: 2 g via INTRAVENOUS
  Filled 2021-10-26: qty 50

## 2021-10-26 MED ORDER — LOSARTAN POTASSIUM 50 MG PO TABS
100.0000 mg | ORAL_TABLET | Freq: Every day | ORAL | Status: DC
  Administered 2021-10-27 – 2021-10-29 (×3): 100 mg via ORAL
  Filled 2021-10-26 (×4): qty 2

## 2021-10-26 MED ORDER — LORAZEPAM 2 MG/ML IJ SOLN
1.0000 mg | Freq: Once | INTRAMUSCULAR | Status: AC | PRN
  Administered 2021-10-26: 1 mg via INTRAVENOUS
  Filled 2021-10-26: qty 1

## 2021-10-26 NOTE — H&P (Signed)
History and Physical    GESSELLE Santana UXN:[id number] DOB: [birth year] DOA: 10/26/2021  PCP: [name], MD   Patient coming from: Home   Chief Complaint: Confusion   HPI: Michaela Santana is a pleasant 76 y.o. female with medical history significant for stage IV low-grade neuroendocrine carcinoma, type 2 diabetes mellitus, hypertension, and chronic back pain, now presenting to the emergency department for evaluation of confusion.  She is accompanied by her daughters who assist with the history.  The patient lives alone, drives, manages her ADLs, but does have mild memory impairment at baseline.  She drove to her daughter's house yesterday, seem to be in her usual state initially, but became progressively confused as the night wore on.  She was even worse today in terms of confusion, appeared lethargic at times, and slid out of a bed at 1 point.  She had not been complaining of anything.  Her daughters report that she has presented similarly when she was dehydrated, or when she has not been taking her medications which include Cymbalta.  She has not taken her medications in the past 2 days.  ED Course: Upon arrival to the ED, patient is found to be afebrile with stable blood pressure.  EKG features second-degree AV block Mobitz type I with QTc 542 ms.  Chest x-ray negative for acute cardiopulmonary disease.  Head CT negative for acute intracranial abnormality.  CTA chest negative for PE but notable for slight progression in mosaic pattern of groundglass attenuation.  MRI brain was negative for acute intracranial abnormality though motion degraded.  CMP and CBC unremarkable.  Lactic acid and troponin normal.  Urinalysis with proteinuria and elevated specific gravity.  Ammonia and TSH were normal in the ED.  Review of Systems:  Unable to complete ROS secondary the patient's clinical condition.  Past Medical History:  Diagnosis Date   Anemia    Arthritis    "probably in hands" (07/17/2016)    CKD (chronic kidney disease), stage III (HCC)    Depression    Family history of adverse reaction to anesthesia    "her father had some issues when he had his gallbladder taken out when he was in his late 51s"   GERD (gastroesophageal reflux disease)    Glaucoma    open angle, bilateral   High cholesterol    History of kidney stones    Hypertension    Lumbago with sciatica, right side    Sepsis ([place]) 07/2016   d/t flu   Type II diabetes mellitus ([place])    Vitamin D deficiency     Past Surgical History:  Procedure Laterality Date   BREAST BIOPSY  1990   "? side; it wasn't anything"   BRONCHIAL BIOPSY  08/01/2021   Procedure: BRONCHIAL BIOPSIES;  Surgeon: Garner Nash, DO;  Location: [facility];  Service: Pulmonary;;   BRONCHIAL NEEDLE ASPIRATION BIOPSY  08/01/2021   Procedure: BRONCHIAL NEEDLE ASPIRATION BIOPSIES;  Surgeon: Garner Nash, DO;  Location: [facility];  Service: Pulmonary;;   CATARACT EXTRACTION, BILATERAL  2021   CHOLECYSTECTOMY OPEN  1971   TUBAL LIGATION  1978   VIDEO BRONCHOSCOPY WITH RADIAL ENDOBRONCHIAL ULTRASOUND  08/01/2021   Procedure: RADIAL ENDOBRONCHIAL ULTRASOUND;  Surgeon: Garner Nash, DO;  Location: [facility];  Service: Pulmonary;;    Social History:   reports that she has never smoked. She has never used smokeless tobacco. She reports that she does not drink alcohol and does not use drugs.  Allergies  Allergen Reactions   Codeine Hypertension   Alprazolam Other (See Comments)    lethargic    Family History  Problem Relation Age of Onset   Dementia Mother    Diabetes Mother    Stroke Father    Hypertension Father    Diabetes Sister    Hypertension Sister    Heart disease Paternal Grandfather    Breast cancer Neg Hx      Prior to Admission medications   Medication Sig Start Date End Date Taking? Authorizing Provider  acetaminophen (TYLENOL) 650 MG CR tablet Take 650 mg by mouth every 8 (eight) hours as needed for pain.    Yes [provider]  amLODipine (NORVASC) 5 MG tablet Take 5 mg by mouth daily. 01/20/20  Yes [provider]  atorvastatin (LIPITOR) 10 MG tablet Take 10 mg by mouth daily.   Yes [provider]  DULoxetine HCl 40 MG CPEP Take 40 mg by mouth daily. 06/29/21  Yes [provider]  famotidine (PEPCID) 20 MG tablet Take 20 mg by mouth daily.   Yes [provider]  ferrous sulfate 325 (65 FE) MG EC tablet Take 325 mg by mouth daily.   Yes [provider]  gabapentin (NEURONTIN) 300 MG capsule Take 300 mg by mouth every evening. 01/01/20  Yes [provider]  glipiZIDE (GLUCOTROL XL) 5 MG 24 hr tablet Take 5 mg by mouth daily with breakfast. 08/13/16  Yes [provider]  loratadine (CLARITIN) 10 MG tablet Take 10 mg by mouth daily as needed for allergies.   Yes [provider]  losartan (COZAAR) 100 MG tablet Take 100 mg by mouth daily. 06/21/21  Yes [provider]  magnesium oxide (MAG-OX) 400 (240 Mg) MG tablet Take 800 mg by mouth 2 (two) times daily. 06/23/21  Yes [provider]  metFORMIN (GLUCOPHAGE-XR) 500 MG 24 hr tablet Take 500-1,000 mg by mouth See admin instructions. Take 1 tablet ( 500 mg) in the morning and 2 tablets (1000 mg) in the evening   Yes [provider]  ondansetron (ZOFRAN) 8 MG tablet Take 8 mg by mouth every 8 (eight) hours as needed for nausea/vomiting. 09/14/21  Yes [provider]  Propylene Glycol (SYSTANE BALANCE) 0.6 % SOLN Place 1 drop into both eyes daily as needed (dry eyes).   Yes [provider]  Vitamin D, Ergocalciferol, (DRISDOL) 50000 units CAPS capsule Take 50,000 Units by mouth every Friday.   Yes [provider]  Blood Glucose Monitoring Suppl (FREESTYLE LITE) DEVI USE UTD 07/26/16   [provider]  FREESTYLE LITE test strip USE TO CHECK FASTING GLUCOSE IN THE MORNING AND 2 HOURS AFTER DINNER OR LUNCH 07/26/16   [provider]  Lancets (FREESTYLE) lancets  07/26/16   [provider]    Physical Exam: Vitals:   10/26/21 2000 10/26/21 2015 10/26/21 2056 10/26/21 2234  BP:   (!) 117/103 (!) 177/100  Pulse: 87   83  Resp: 16   20  Temp:    99.3 F (37.4 C)  TempSrc:    Oral  SpO2: 96% 94%  96%    Constitutional: NAD, calm  Eyes: PERTLA, lids and conjunctivae normal ENMT: Mucous membranes are moist. Posterior pharynx clear of any exudate or lesions.   Neck: supple, no masses  Respiratory:  no wheezing, no crackles. No accessory muscle use.  Cardiovascular: S1 & S2 heard, regular rate and rhythm. No extremity edema.   Abdomen: No distension, no  tenderness, soft. Bowel sounds active.  Musculoskeletal: no clubbing / cyanosis. No joint deformity upper and lower extremities.   Skin: no significant rashes, lesions, ulcers. Warm, dry, well-perfused. Neurologic: CN 2-12 grossly intact. Moving all extremities. Alert and oriented to person only.  Psychiatric: Calm. Cooperative.    Labs and Imaging on Admission: I have personally reviewed following labs and imaging studies  CBC: Recent Labs  Lab 10/26/21 1042  WBC 5.3  NEUTROABS 3.5  HGB 12.5  HCT 37.5  MCV 94.5  PLT 045   Basic Metabolic Panel: Recent Labs  Lab 10/26/21 1042  NA 136  K 3.8  CL 104  CO2 26  GLUCOSE 156*  BUN 19  CREATININE 0.99  CALCIUM 9.2  MG 1.8  PHOS 3.2   GFR: CrCl cannot be calculated (Unknown ideal weight.). Liver Function Tests: Recent Labs  Lab 10/26/21 1042  AST 16  ALT 16  ALKPHOS 73  BILITOT 0.8  PROT 6.5  ALBUMIN 3.8   No results for input(s): LIPASE, AMYLASE in the last 168 hours. Recent Labs  Lab 10/26/21 1042  AMMONIA 14   Coagulation Profile: Recent Labs  Lab 10/26/21 1042  INR 0.9   Cardiac Enzymes: No results for input(s): CKTOTAL, CKMB, CKMBINDEX, TROPONINI in the last 168 hours. BNP (last 3 results) No results for input(s): PROBNP in the last 8760  hours. HbA1C: No results for input(s): HGBA1C in the last 72 hours. CBG: Recent Labs  Lab 10/26/21 2237  GLUCAP 130*   Lipid Profile: No results for input(s): CHOL, HDL, LDLCALC, TRIG, CHOLHDL, LDLDIRECT in the last 72 hours. Thyroid Function Tests: Recent Labs    10/26/21 2014  TSH 1.819   Anemia Panel: No results for input(s): VITAMINB12, FOLATE, FERRITIN, TIBC, IRON, RETICCTPCT in the last 72 hours. Urine analysis:    Component Value Date/Time   COLORURINE YELLOW 10/26/2021 1610   APPEARANCEUR CLEAR 10/26/2021 1610   LABSPEC 1.032 (H) 10/26/2021 1610   PHURINE 6.0 10/26/2021 1610   GLUCOSEU NEGATIVE 10/26/2021 1610   HGBUR NEGATIVE 10/26/2021 [place] 10/26/2021 1610   [place] 10/26/2021 1610   PROTEINUR 30 (A) 10/26/2021 1610   NITRITE NEGATIVE 10/26/2021 1610   LEUKOCYTESUR NEGATIVE 10/26/2021 1610   Sepsis Labs: @LABRCNTIP (procalcitonin:4,lacticidven:4) )No results found for this or any previous visit (from the past 240 hour(s)).   Radiological Exams on Admission: CT Head Wo Contrast  Result Date: 10/26/2021 CLINICAL DATA:  Mental status change, unknown cause. Generalized weakness. History of lung cancer. EXAM: CT HEAD WITHOUT CONTRAST TECHNIQUE: Contiguous axial images were obtained from the base of the skull through the vertex without intravenous contrast. RADIATION DOSE REDUCTION: This exam was performed according to the departmental dose-optimization program which includes automated exposure control, adjustment of the mA and/or kV according to patient size and/or use of iterative reconstruction technique. COMPARISON:  Head MRI 08/24/2021 FINDINGS: Brain: There is no evidence of an acute infarct, intracranial hemorrhage, mass, midline shift, or extra-axial fluid collection. There is mild generalized cerebral atrophy. Hypodensities in the cerebral white matter bilaterally are nonspecific but compatible with mild chronic small vessel  ischemic disease. Vascular: Calcified atherosclerosis at the skull base. No hyperdense vessel. Skull: No fracture or suspicious osseous lesion. Sinuses/Orbits: Mild left-sided ethmoid air cell mucosal thickening. Clear mastoid air cells. Bilateral cataract extraction. Other: None. IMPRESSION: 1. No evidence of acute intracranial abnormality. 2. Mild chronic small vessel ischemic disease. Electronically Signed   By: Logan Bores M.D.   On: 10/26/2021 14:12  CT Angio Chest PE W/Cm &/Or Wo Cm  Result Date: 10/26/2021 CLINICAL DATA:  Chest pain and generalized weakness for several weeks. History of lung cancer. EXAM: CT ANGIOGRAPHY CHEST WITH CONTRAST TECHNIQUE: Multidetector CT imaging of the chest was performed using the standard protocol during bolus administration of intravenous contrast. Multiplanar CT image reconstructions and MIPs were obtained to evaluate the vascular anatomy. RADIATION DOSE REDUCTION: This exam was performed according to the departmental dose-optimization program which includes automated exposure control, adjustment of the mA and/or kV according to patient size and/or use of iterative reconstruction technique. CONTRAST:  4mL OMNIPAQUE IOHEXOL 350 MG/ML SOLN COMPARISON:  07/28/2021 FINDINGS: Cardiovascular: The heart is normal in size. No pericardial effusion. The aorta is normal in caliber. No dissection. Scattered atherosclerotic calcification. No obvious coronary artery calcifications. The pulmonary arterial tree is well opacified. No filling defects to suggest pulmonary embolism. Mediastinum/Nodes: No mediastinal or hilar mass or lymphadenopathy. Small scattered lymph nodes are stable. Stable large hiatal hernia. Lungs/Pleura: Persistent and perhaps slightly more pronounced mosaic pattern of ground-glass attenuation which could be due to small airways disease such as asthma, respiratory bronchiolitis, other reactive airways disease, hypersensitivity pneumonitis or cryptogenic  organizing pneumonia. No focal pulmonary infiltrates or pleural effusions. Persistent diffuse pulmonary metastatic disease. The larger nodules are unchanged. 12 mm left upper lobe nodule on image 41/6 is stable. 11 mm left lower lobe nodule on image number 52/6 is stable. 9 mm left lower lobe nodule on image number 94/6 is stable. No new or progressive findings are identified. Upper Abdomen: No significant upper abdominal findings Musculoskeletal: No breast masses, supraclavicular or axillary adenopathy. The bony thorax is intact. Review of the MIP images confirms the above findings. IMPRESSION: 1. No CT findings for pulmonary embolism. 2. Normal caliber thoracic aorta.  No dissection. 3. Persistent and slightly progressive mosaic pattern of ground-glass attenuation which could be due to small airways disease such as asthma, respiratory bronchiolitis, other reactive airways disease, hypersensitivity pneumonitis or cryptogenic organizing pneumonia. 4. Stable diffuse pulmonary metastatic disease. 5. No mediastinal or hilar mass or adenopathy. 6. Large hiatal hernia. 7. Aortic atherosclerosis. Aortic Atherosclerosis (ICD10-I70.0). Electronically Signed   By: Marijo Sanes M.D.   On: 10/26/2021 14:20   MR BRAIN WO CONTRAST  Result Date: 10/26/2021 CLINICAL DATA:  Generalized weakness.  History of lung carcinoma. EXAM: MRI HEAD WITHOUT CONTRAST TECHNIQUE: Multiplanar, multiecho pulse sequences of the brain and surrounding structures were obtained without intravenous contrast. COMPARISON:  08/24/2021 FINDINGS: Examination is degraded by motion. Brain: No acute infarct, mass effect or extra-axial collection. There is multifocal hyperintense T2-weighted signal within the white matter. Generalized cerebral volume loss. The midline structures are normal. Vascular: Major flow voids are preserved. Skull and upper cervical spine: Normal calvarium and skull base. Visualized upper cervical spine and soft tissues are normal.  Sinuses/Orbits:No paranasal sinus fluid levels or advanced mucosal thickening. No mastoid or middle ear effusion. Normal orbits. IMPRESSION: 1. Motion degraded study without acute intracranial abnormality. 2. Findings of chronic microvascular ischemia and cerebral volume loss. Electronically Signed   By: Ulyses Jarred M.D.   On: 10/26/2021 22:06   DG Chest Port 1 View  Result Date: 10/26/2021 CLINICAL DATA:  Generalized weakness EXAM: PORTABLE CHEST 1 VIEW COMPARISON:  08/23/2021 FINDINGS: No focal consolidation. No pleural effusion or pneumothorax. Stable cardiomegaly. Large hiatal hernia. No acute osseous abnormality. IMPRESSION: 1. No acute cardiopulmonary disease. 2. Large hiatal hernia. Electronically Signed   By: Kathreen Devoid M.D.   On: 10/26/2021 10:57  EKG: Independently reviewed. 2nd deg AV block Mobitz type I, QTc 432 ms.   Assessment/Plan   1. Acute encephalopathy  - Presents with confusion, worsening over the past 24 hours   - No acute findings on MRI brain, no infectious s/s, TSH and ammonia normal in ED  - Per daughters, she has mild memory deficit at baseline and has presented similarly when dehydrated or not taking her medications including Cymbalta  - Plan to use delirium precautions, continue IVF hydration, resume Cymbalta   2. Hypertension  - BP elevated in setting of medication non-compliance  - Resume losartan and amlodipine   3. Type II DM  - A1c was 7.3% in 2018  - Check CBGs and use low-intensity SSI for now    4. Carcinoid tumor  - Stage IV low-grade neuroendocrine carcinoma, presenting in March 2023 with innumerable pulmonary nodules, and currently under observation    5. Mobitz type 1 second degree AV block  - Mobitz type 1 2nd deg AVB noted on EKG in ED - She has hx of 1st degree AV block, has also had Wenckebach (see EKG from 06/09/22), seen by EP previously, appears to be asymptomatic   6. Prolonged QT interval  - QTc is 542 ms in ED  - Supplement  potassium and magnesium, avoid QT-prolonging medications     DVT prophylaxis: Lovenox  Code Status: Full  Level of Care: Level of care: Med-Surg Family Communication: daughters at bedside  Disposition Plan:  Patient is from: home  Anticipated d/c is to: TBD Anticipated d/c date is: 5/19 or 10/28/21  Patient currently: Pending improved/stable mental status, PT eval  Consults called: none  Admission status: Observation     Vianne Bulls, MD [facility]  10/26/2021, 10:59 PM

## 2021-10-26 NOTE — ED Provider Notes (Signed)
Call to family by room, patient is confused, trying to climb out of bed, not at her baseline per family.  Her work-up thus far has been unrevealing including CT head.  Urinalysis is negative for infection.  Ammonia is normal.  Patient appears to have confusion and delirium of uncertain etiology but is not at her baseline per family in room.  Will obtain brain MRI to r/o metastatic lesions and admit for further evaluation.  D/w Dr. Myna Hidalgo.   Ezequiel Essex, MD 10/27/21 [phone]

## 2021-10-26 NOTE — ED Triage Notes (Signed)
EMS reports from home, family called for generalized weakness over last several weeks. Lung CA Pt. Non-compliant with meds per family.  BP 140/82 HR 76  Sp02 99 RA RR 16 CBG 158

## 2021-10-26 NOTE — ED Provider Notes (Signed)
[facility] DEPT Provider Note   CSN: [id number] Arrival date & time: 10/26/21  5449     History  Chief Complaint  Patient presents with   Weakness    Michaela Santana is a 76 y.o. female.  HPI  Past medical history significant for stage IV (T1b, N0, M1 a) low-grade neuroendocrine carcinoma (carcinoid tumor) presented with innumerable bilateral pulmonary nodules diagnosed in March 2023. The patient had MRI of the brain performed recently.  It showed no evidence for metastatic disease to the brain.  Patient reports she does not know why she is at the emergency department.  She reports her daughters thought she needed to come.  The last thing she remembers is that EMS showed up her in her house and she wondered what her daughters had done, why they had called. She denies she is having any pain.  She knows the name of the hospital and the month but she cannot remember the year.  Home Medications Prior to Admission medications   Medication Sig Start Date End Date Taking? Authorizing Provider  amLODipine (NORVASC) 5 MG tablet Take 5 mg by mouth daily. 01/20/20   [provider]  atorvastatin (LIPITOR) 10 MG tablet Take 10 mg by mouth daily.    [provider]  Blood Glucose Monitoring Suppl (FREESTYLE LITE) DEVI USE UTD 07/26/16   [provider]  DULoxetine HCl 40 MG CPEP Take 40 mg by mouth daily. 06/29/21   [provider]  ferrous sulfate 325 (65 FE) MG EC tablet Take 325 mg by mouth daily.    [provider]  FREESTYLE LITE test strip USE TO CHECK FASTING GLUCOSE IN THE MORNING AND 2 HOURS AFTER DINNER OR LUNCH 07/26/16   [provider]  gabapentin (NEURONTIN) 300 MG capsule Take 300 mg by mouth every evening. 01/01/20   [provider]  glipiZIDE (GLUCOTROL XL) 5 MG 24 hr tablet Take 5 mg by mouth daily with breakfast. 08/13/16   [provider]  Lancets (FREESTYLE) lancets  07/26/16    [provider]  lansoprazole (PREVACID) 30 MG capsule Take 30 mg by mouth daily.    [provider]  loratadine (CLARITIN) 10 MG tablet Take 10 mg by mouth daily as needed for allergies.    [provider]  losartan (COZAAR) 100 MG tablet Take 100 mg by mouth daily. 06/21/21   [provider]  magnesium oxide (MAG-OX) 400 (240 Mg) MG tablet Take 1,000 mg by mouth daily. Taking 2.5 tablets starting today 1,000 mg 06/23/21   [provider]  metFORMIN (GLUCOPHAGE-XR) 500 MG 24 hr tablet Take 500-1,000 mg by mouth See admin instructions. Take 1 tablet ( 500 mg) in the morning and 2 tablets (1000 mg) in the evening    [provider]  Propylene Glycol (SYSTANE BALANCE) 0.6 % SOLN Place 1 drop into both eyes daily as needed (dry eyes).    [provider]  Vitamin D, Ergocalciferol, (DRISDOL) 50000 units CAPS capsule Take 50,000 Units by mouth every Friday.    [provider]      Allergies    Codeine and Alprazolam    Review of Systems   Review of Systems Level 5 caveat cannot obtain full review of systems due to lack of patient recall. Physical Exam Updated Vital Signs BP (!) 156/89   Pulse 66   Temp 97.9 F (36.6 C) (Oral)   SpO2 94%  Physical Exam Constitutional:  Comments: Patient is awake and alert.  She does not have respiratory distress.  Color is good.  HENT:     Head: Normocephalic and atraumatic.     Mouth/Throat:     Pharynx: Oropharynx is clear.  Eyes:     Extraocular Movements: Extraocular movements intact.     Pupils: Pupils are equal, round, and reactive to light.  Cardiovascular:     Rate and Rhythm: Normal rate and regular rhythm.  Pulmonary:     Effort: Pulmonary effort is normal.     Breath sounds: Normal breath sounds.  Abdominal:     General: There is no distension.     Palpations: Abdomen is soft.     Tenderness: There is no abdominal tenderness. There is no guarding.     Comments: No  rashes in the groin folds.  No soft tissue abnormalities of the trunk.  Musculoskeletal:        General: Normal range of motion.     Comments: No peripheral edema.  Calves are soft nontender.  Condition of the feet and the legs is good without rashes or areas suggestive of cellulitis or injury.  Skin:    General: Skin is warm and dry.     Findings: No rash.  Neurological:     Comments: Patient is alert.  He is situationally oriented.  She is aware of location and the month.  She cannot recall the year.  She follows commands for grip strength.  She seems mildly confused and requires a little extra time and coaching to follow commands.  She is not exhibiting acute delirium.  Patient is not somnolent or obtunded.  Psychiatric:        Mood and Affect: Mood normal.    ED Results / Procedures / Treatments   Labs (all labs ordered are listed, but only abnormal results are displayed) Labs Reviewed - No data to display  EKG None  Radiology No results found.  Procedures Procedures    Medications Ordered in ED Medications - No data to display  ED Course/ Medical Decision Making/ A&P                           Medical Decision Making Amount and/or Complexity of Data Reviewed Labs: ordered. Radiology: ordered.  Risk Prescription drug management. Decision regarding hospitalization.   Patient presents and at this time per EMS complaint is generalized weakness.  EMS report includes noncompliance with medications per family.  Awaiting family members for additional history.  Initial evaluation shows patient with normal blood pressure and heart rate.  No respiratory distress.  No evident focal neurologic deficits.  Patient did have recent MRI to rule out metastatic carcinoid to the brain.  Patient does seem slightly delayed or cognitively impaired.  Question whether this is dementia or acute process.  We will need additional history from family members.  Will initiate diagnostic evaluation  with lab work and chest x-ray as well as EKG.  Diagnostic results essentially within normal limits.  No focal findings on exam.  Consideration given to Neurontin as source of sedation and imbalance.  Recommended trying to discontinue Neurontin and try to stay consistent with duloxetine.  There have been missed doses this week.  May be exacerbating mental status changes.  Patient was ambulated.  She was essentially at baseline but has need for assistance.  Patient wants to go home.  I reviewed this with the daughters at bedside.  At this time pending  urinalysis plan will be for discharge to home and continue working with the PCP and neurology referrals regarding incremental cognitive and functional decline.  Dr. Wyvonnia Dusky to f/u [facility] to determime if needs Abx for D/C.          Final Clinical Impression(s) / ED Diagnoses Final diagnoses:  Generalized weakness  Severe comorbid illness  Confusion    Rx / DC Orders ED Discharge Orders     None         Charlesetta Shanks, MD 10/27/21 [phone]

## 2021-10-26 NOTE — Discharge Instructions (Signed)
1.  See your doctor for recheck as soon as possible. 2.  Try to discontinue your Neurontin if possible.  This may be causing additional imbalance and weakness and confusion. 3.  Return to the emergency department if you are having new worsening or concerning symptoms.

## 2021-10-26 NOTE — ED Notes (Signed)
Notified CT that patient had been medicated for scan

## 2021-10-26 NOTE — ED Notes (Signed)
Pt is unable to urinate at this time per nurse stated to wait until fluids are finish

## 2021-10-27 DIAGNOSIS — G9340 Encephalopathy, unspecified: Secondary | ICD-10-CM | POA: Diagnosis not present

## 2021-10-27 LAB — RPR: RPR Ser Ql: NONREACTIVE

## 2021-10-27 LAB — GLUCOSE, CAPILLARY
Glucose-Capillary: 128 mg/dL — ABNORMAL HIGH (ref 70–99)
Glucose-Capillary: 193 mg/dL — ABNORMAL HIGH (ref 70–99)
Glucose-Capillary: 178 mg/dL — ABNORMAL HIGH (ref 70–99)
Glucose-Capillary: 171 mg/dL — ABNORMAL HIGH (ref 70–99)

## 2021-10-27 LAB — CBC
HCT: 40 % (ref 36.0–46.0)
Hemoglobin: 13 g/dL (ref 12.0–15.0)
MCH: 31 pg (ref 26.0–34.0)
MCHC: 32.5 g/dL (ref 30.0–36.0)
MCV: 95.2 fL (ref 80.0–100.0)
Platelets: 221 10*3/uL (ref 150–400)
RBC: 4.2 MIL/uL (ref 3.87–5.11)
RDW: 14.4 % (ref 11.5–15.5)
WBC: 6.9 10*3/uL (ref 4.0–10.5)
nRBC: 0 % (ref 0.0–0.2)

## 2021-10-27 LAB — HEMOGLOBIN A1C
Hgb A1c MFr Bld: 6.8 % — ABNORMAL HIGH (ref 4.8–5.6)
Mean Plasma Glucose: 148.46 mg/dL

## 2021-10-27 LAB — BASIC METABOLIC PANEL
Anion gap: 11 (ref 5–15)
BUN: 14 mg/dL (ref 8–23)
CO2: 22 mmol/L (ref 22–32)
Calcium: 9.2 mg/dL (ref 8.9–10.3)
Chloride: 104 mmol/L (ref 98–111)
Creatinine, Ser: 0.97 mg/dL (ref 0.44–1.00)
GFR, Estimated: >60 mL/min (ref >60)
Glucose, Bld: 148 mg/dL — ABNORMAL HIGH (ref 70–99)
Potassium: 3.8 mmol/L (ref 3.5–5.1)
Sodium: 137 mmol/L (ref 135–145)

## 2021-10-27 LAB — VITAMIN B12: Vitamin B-12: 128 pg/mL — ABNORMAL LOW (ref 180–914)

## 2021-10-27 MED ORDER — QUETIAPINE FUMARATE 25 MG PO TABS
25.0000 mg | ORAL_TABLET | Freq: Two times a day (BID) | ORAL | Status: DC
  Administered 2021-10-27 (×2): 25 mg via ORAL
  Filled 2021-10-27 (×2): qty 1

## 2021-10-27 MED ORDER — CYANOCOBALAMIN 1000 MCG/ML IJ SOLN
1000.0000 ug | Freq: Every day | INTRAMUSCULAR | Status: AC
  Administered 2021-10-27 – 2021-10-29 (×3): 1000 ug via INTRAMUSCULAR
  Filled 2021-10-27 (×3): qty 1

## 2021-10-27 NOTE — Evaluation (Addendum)
Physical Therapy Evaluation Patient Details Name: Michaela Santana MRN: [id number] DOB: [birth year] Today's Date: 10/27/2021  History of Present Illness  Pt is a 76 y.o. female presenting with acute encephalopathy. PMH significant for stage IV low-grade neuroendocrine carcinoma, type 2 diabetes mellitus, hypertension, and chronic back pain.   Clinical Impression  Pt is a 76 y.o. female with above HPI resulting in the deficits listed below (see PT Problem List). Pt is independent without AD and lives alone at baseline. Pt presenting today lethargic and with cognitive deficits requiring repeated multimodal cuing and MIN A for performance and safety with all mobility, +2 provided for safety. Pt's daughter report that they can provide 24hr assistance at home.  Pt will benefit from skilled PT to maximize functional mobility and increase independence.       Recommendations for follow up therapy are one component of a multi-disciplinary discharge planning process, led by the attending physician.  Recommendations may be updated based on patient status, additional functional criteria and insurance authorization.  Follow Up Recommendations Home health PT    Assistance Recommended at Discharge Frequent or constant Supervision/Assistance  Patient can return home with the following  A little help with walking and/or transfers;A little help with bathing/dressing/bathroom;Assist for transportation;Help with stairs or ramp for entrance;Direct supervision/assist for medications management;Direct supervision/assist for financial management;Assistance with cooking/housework    Equipment Recommendations    Recommendations for Other Services  OT consult    Functional Status Assessment Patient has had a recent decline in their functional status and demonstrates the ability to make significant improvements in function in a reasonable and predictable amount of time.     Precautions / Restrictions  Precautions Precautions: Fall Restrictions Weight Bearing Restrictions: No      Mobility  Bed Mobility Overal bed mobility: Needs Assistance Bed Mobility: Sit to Supine       Sit to supine: Min guard   General bed mobility comments: sitting EOB upon entry with staff and family supervision.    Transfers Overall transfer level: Needs assistance Equipment used: Rolling walker (2 wheels) Transfers: Sit to/from Stand Sit to Stand: Min assist           General transfer comment: MIN A for power up and repeated cues required for initiation of movement. Pt stating "i don't know what's happening to me" Pt lethargic throughout.    Ambulation/Gait Ambulation/Gait assistance: Min assist, +2 safety/equipment Gait Distance (Feet): 30 Feet Assistive device: Rolling walker (2 wheels) Gait Pattern/deviations: Step-to pattern, Decreased stride length, Trunk flexed, Narrow base of support Gait velocity: decr     General Gait Details: repeated mulimodal cuing provided to maintain close proximity to RW. Therapist at one point having to manually block RW from moving forward and instructing pt to walk forward into RW. Trialed use of no AD, pt requiring HHA and reaching out for furniture/doorways. 2nd HHA provided for short ambulation from door back to EOB. Manual assist required for making turns with RW. Increased LB pain reported and pt with decreased bilateral foot clearance and increased trunk flexion and reporting unable to stand upright posture because of back pain, able to show therapist with her hand where she is having pain. +2 provided for safety.  Stairs            Wheelchair Mobility    Modified Rankin (Stroke Patients Only)       Balance Overall balance assessment: Needs assistance Sitting-balance support: Feet supported, No upper extremity supported Sitting balance-Leahy Scale: Good  Standing balance support: Bilateral upper extremity supported, Single extremity  supported, During functional activity Standing balance-Leahy Scale: Poor Standing balance comment: support from RW or therapist HHA for standing                             Pertinent Vitals/Pain Pain Assessment Pain Assessment: Faces Faces Pain Scale: Hurts even more Pain Location: L LB radiating to mid thigh Pain Descriptors / Indicators: Grimacing Pain Intervention(s): Limited activity within patient's tolerance, Monitored during session, Repositioned (RN notified pt having pain when OOB, but reporting resolved in supine)    Home Living Family/patient expects to be discharged to:: Private residence Living Arrangements: Alone Available Help at Discharge: Family Type of Home: House Home Access: Stairs to enter Entrance Stairs-Rails: None Entrance Stairs-Number of Steps: 1 threshold   Home Layout: One level Home Equipment: Conservation officer, nature (2 wheels);Rollator (4 wheels);Cane - single point;Tub bench (can get grab bars if needed) Additional Comments: has multiple daughters that live close. Was going to start HHPT and SLP, but did not get chance before coming to hospital.    Prior Function Prior Level of Function : Independent/Modified Independent;Driving             Mobility Comments: no AD use at baseline. fell getting OOB to go to the bathroom, landed against wall, same day slid out of the bed.       Hand Dominance        Extremity/Trunk Assessment   Upper Extremity Assessment Upper Extremity Assessment: Overall WFL for tasks assessed    Lower Extremity Assessment Lower Extremity Assessment: Generalized weakness    Cervical / Trunk Assessment Cervical / Trunk Assessment: Normal  Communication   Communication: No difficulties  Cognition Arousal/Alertness: Lethargic Behavior During Therapy: Flat affect Overall Cognitive Status: Impaired/Different from baseline Area of Impairment: Orientation, Memory, Following commands                  Orientation Level: Disoriented to, Person, Place, Time, Situation   Memory: Decreased short-term memory Following Commands: Follows one step commands inconsistently       General Comments: Pt initially unable to tell therapist her name taking x2 attempts, unable to recall DOB or where she is and unsure why she is in hospital. Pt's daughter reports that she was able to state name/DOB in ED when initially arrived and reports this is not baseline cognitive status.        General Comments      Exercises     Assessment/Plan    PT Assessment Patient needs continued PT services  PT Problem List Decreased strength;Decreased activity tolerance;Decreased balance;Decreased mobility;Decreased safety awareness;Decreased cognition;Decreased knowledge of use of DME       PT Treatment Interventions DME instruction;Gait training;Stair training;Functional mobility training;Therapeutic activities;Therapeutic exercise;Balance training;Patient/family education    PT Goals (Current goals can be found in the Care Plan section)  Acute Rehab PT Goals Patient Stated Goal: per daughter- clear up confusion and improve independence PT Goal Formulation: Patient unable to participate in goal setting Time For Goal Achievement: 11/10/21 Potential to Achieve Goals: Good    Frequency Min 3X/week     Co-evaluation               AM-PAC PT "6 Clicks" Mobility  Outcome Measure Help needed turning from your back to your side while in a flat bed without using bedrails?: A Little Help needed moving from lying on your back to sitting on  the side of a flat bed without using bedrails?: A Little Help needed moving to and from a bed to a chair (including a wheelchair)?: A Little Help needed standing up from a chair using your arms (e.g., wheelchair or bedside chair)?: A Little Help needed to walk in hospital room?: A Little Help needed climbing 3-5 steps with a railing? : A Lot 6 Click Score: 17    End of  Session Equipment Utilized During Treatment: Gait belt Activity Tolerance: Patient limited by lethargy;Patient limited by pain Patient left: in bed;with call bell/phone within reach;with bed alarm set;with family/visitor present Nurse Communication: Mobility status PT Visit Diagnosis: Unsteadiness on feet (R26.81);Muscle weakness (generalized) (M62.81);History of falling (Z91.81)    Time: 6553-7482 PT Time Calculation (min) (ACUTE ONLY): 23 min   Charges:   PT Evaluation $PT Eval Low Complexity: 1 Low PT Treatments $Therapeutic Activity: 8-22 mins        Festus Barren PT, DPT  Acute Rehabilitation Services  Office [phone]  10/27/2021, 12:32 PM

## 2021-10-27 NOTE — Progress Notes (Signed)
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  Lives at home alone.  Recently diagnosed depression/cognitive decline however independent and still driving.  For last few days, she has been more lethargic confused and impulsive so brought to ER. In the emergency room afebrile.  Blood pressure stable.  Chest x-ray normal.  Head CT negative for any intracranial abnormality.  CTA chest negative for PE but notable for mosaic pattern of groundglass attenuation consistent with her known carcinomatosis.  MRI brain with motion artifact but no major events.  Lactic acid troponin normal.  Electrolytes normal.  Ammonia and TSH normal.   Assessment & Plan:   Acute metabolic encephalopathy, multifactorial. Urinalysis, chest x-ray and infection work-up negative.  Less likely any infection. Electrolytes are normal.  TSH and ammonia is normal. B12 is low, may be responsible for subacute cognitive decline.  Aggressive replacement of B12 1 mg intramuscular every day while in the hospital and will continue on discharge. Patient with impulsiveness and restlessness, discussed with family and they are agreeable to start on symptom control medications.  Will start on Seroquel 25 mg twice daily. PT OT and mobilization.  Delirium precautions. Does have a history of depression and reported good benefit with Cymbalta, will continue Cymbalta.  Essential hypertension: Stable.  Resume losartan and amlodipine.  Type 2 diabetes: A1c 7.3.  On sliding scale insulin while in the hospital.  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  Case discussed with her oncologist whether needs any intervention, however recommended no new  interventions.  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  Asymptomatic.   DVT prophylaxis: enoxaparin (LOVENOX) injection 40 mg Start: 10/27/21 1000   Code Status: Full code Family Communication: 2 daughters at the bedside Disposition Plan: Status is: Observation The patient will require care spanning > 2 midnights and should be moved to inpatient because: Significant altered mental status, unsafe discharge.     Consultants:  Oncology, phone consultation  Procedures:  None  Antimicrobials:  None   Subjective: Patient seen and examined.  2 daughters at the bedside.  I tried best to interview patient, she was not able to communicate.  Looks distracted.  Difficult to engage.  Afebrile.  Objective: Vitals:   10/27/21 0305 10/27/21 0733 10/27/21 1000 10/27/21 1108  BP: 136/69 (!) 153/83  135/72  Pulse: 85 80  85  Resp: 20 20  20   Temp: 98.3 F (36.8 C) 98.7 F (37.1 C)  98.1 F (36.7 C)  TempSrc: Oral Oral  Oral  SpO2: 99% 99%  95%  Weight:   79.4 kg   Height:   5\' 2"  (1.575 m)    No intake or output data in the 24 hours ending 10/27/21 1234 Filed Weights   10/27/21 1000  Weight: 79.4 kg    Examination:  General exam: Appears anxious.  Impulsive.  Alert and awake but not answering orientation questions. Respiratory system: Clear to auscultation. Respiratory effort normal.  No added sounds. Cardiovascular system: S1 & S2 heard, RRR. No JVD, murmurs, rubs, gallops or clicks. No pedal edema. Gastrointestinal system: Abdomen is nondistended, soft and nontender. No organomegaly or masses felt. Normal bowel sounds heard. Central nervous system: Alert and awake.  Refuses to answer questions.  No focal  deficits. Easily distractible. Extremities: Symmetric 5 x 5 power.  Moves all extremities equally.    Data Reviewed: I have personally reviewed following labs and imaging studies  CBC: Recent Labs  Lab 10/26/21 1042 10/27/21 0629  WBC 5.3 6.9   NEUTROABS 3.5  --   HGB 12.5 13.0  HCT 37.5 40.0  MCV 94.5 95.2  PLT 224 009   Basic Metabolic Panel: Recent Labs  Lab 10/26/21 1042 10/27/21 0534  NA 136 137  K 3.8 3.8  CL 104 104  CO2 26 22  GLUCOSE 156* 148*  BUN 19 14  CREATININE 0.99 0.97  CALCIUM 9.2 9.2  MG 1.8  --   PHOS 3.2  --    GFR: Estimated Creatinine Clearance: 49.6 mL/min (by C-G formula based on SCr of 0.97 mg/dL). Liver Function Tests: Recent Labs  Lab 10/26/21 1042  AST 16  ALT 16  ALKPHOS 73  BILITOT 0.8  PROT 6.5  ALBUMIN 3.8   No results for input(s): LIPASE, AMYLASE in the last 168 hours. Recent Labs  Lab 10/26/21 1042  AMMONIA 14   Coagulation Profile: Recent Labs  Lab 10/26/21 1042  INR 0.9   Cardiac Enzymes: No results for input(s): CKTOTAL, CKMB, CKMBINDEX, TROPONINI in the last 168 hours. BNP (last 3 results) No results for input(s): PROBNP in the last 8760 hours. HbA1C: Recent Labs    10/27/21 0629  HGBA1C 6.8*   CBG: Recent Labs  Lab 10/26/21 2237 10/27/21 0725 10/27/21 1125  GLUCAP 130* 128* 193*   Lipid Profile: No results for input(s): CHOL, HDL, LDLCALC, TRIG, CHOLHDL, LDLDIRECT in the last 72 hours. Thyroid Function Tests: Recent Labs    10/26/21 2014  TSH 1.819   Anemia Panel: Recent Labs    10/27/21 0629  VITAMINB12 128*   Sepsis Labs: Recent Labs  Lab 10/26/21 1042 10/26/21 2011  LATICACIDVEN 1.9 1.4    No results found for this or any previous visit (from the past 240 hour(s)).       Radiology Studies: CT Head Wo Contrast  Result Date: 10/26/2021 CLINICAL DATA:  Mental status change, unknown cause. Generalized weakness. History of lung cancer. EXAM: CT HEAD WITHOUT CONTRAST TECHNIQUE: Contiguous axial images were obtained from the base of the skull through the vertex without intravenous contrast. RADIATION DOSE REDUCTION: This exam was performed according to the departmental dose-optimization program which includes automated  exposure control, adjustment of the mA and/or kV according to patient size and/or use of iterative reconstruction technique. COMPARISON:  Head MRI 08/24/2021 FINDINGS: Brain: There is no evidence of an acute infarct, intracranial hemorrhage, mass, midline shift, or extra-axial fluid collection. There is mild generalized cerebral atrophy. Hypodensities in the cerebral white matter bilaterally are nonspecific but compatible with mild chronic small vessel ischemic disease. Vascular: Calcified atherosclerosis at the skull base. No hyperdense vessel. Skull: No fracture or suspicious osseous lesion. Sinuses/Orbits: Mild left-sided ethmoid air cell mucosal thickening. Clear mastoid air cells. Bilateral cataract extraction. Other: None. IMPRESSION: 1. No evidence of acute intracranial abnormality. 2. Mild chronic small vessel ischemic disease. Electronically Signed   By: Logan Bores M.D.   On: 10/26/2021 14:12   CT Angio Chest PE W/Cm &/Or Wo Cm  Result Date: 10/26/2021 CLINICAL DATA:  Chest pain and generalized weakness for several weeks. History of lung cancer. EXAM: CT ANGIOGRAPHY CHEST WITH CONTRAST TECHNIQUE: Multidetector CT imaging of the chest was performed using the standard protocol during bolus administration of intravenous contrast. Multiplanar CT image reconstructions and MIPs were  obtained to evaluate the vascular anatomy. RADIATION DOSE REDUCTION: This exam was performed according to the departmental dose-optimization program which includes automated exposure control, adjustment of the mA and/or kV according to patient size and/or use of iterative reconstruction technique. CONTRAST:  80mL OMNIPAQUE IOHEXOL 350 MG/ML SOLN COMPARISON:  07/28/2021 FINDINGS: Cardiovascular: The heart is normal in size. No pericardial effusion. The aorta is normal in caliber. No dissection. Scattered atherosclerotic calcification. No obvious coronary artery calcifications. The pulmonary arterial tree is well opacified. No  filling defects to suggest pulmonary embolism. Mediastinum/Nodes: No mediastinal or hilar mass or lymphadenopathy. Small scattered lymph nodes are stable. Stable large hiatal hernia. Lungs/Pleura: Persistent and perhaps slightly more pronounced mosaic pattern of ground-glass attenuation which could be due to small airways disease such as asthma, respiratory bronchiolitis, other reactive airways disease, hypersensitivity pneumonitis or cryptogenic organizing pneumonia. No focal pulmonary infiltrates or pleural effusions. Persistent diffuse pulmonary metastatic disease. The larger nodules are unchanged. 12 mm left upper lobe nodule on image 41/6 is stable. 11 mm left lower lobe nodule on image number 52/6 is stable. 9 mm left lower lobe nodule on image number 94/6 is stable. No new or progressive findings are identified. Upper Abdomen: No significant upper abdominal findings Musculoskeletal: No breast masses, supraclavicular or axillary adenopathy. The bony thorax is intact. Review of the MIP images confirms the above findings. IMPRESSION: 1. No CT findings for pulmonary embolism. 2. Normal caliber thoracic aorta.  No dissection. 3. Persistent and slightly progressive mosaic pattern of ground-glass attenuation which could be due to small airways disease such as asthma, respiratory bronchiolitis, other reactive airways disease, hypersensitivity pneumonitis or cryptogenic organizing pneumonia. 4. Stable diffuse pulmonary metastatic disease. 5. No mediastinal or hilar mass or adenopathy. 6. Large hiatal hernia. 7. Aortic atherosclerosis. Aortic Atherosclerosis (ICD10-I70.0). Electronically Signed   By: Marijo Sanes M.D.   On: 10/26/2021 14:20   MR BRAIN WO CONTRAST  Result Date: 10/26/2021 CLINICAL DATA:  Generalized weakness.  History of lung carcinoma. EXAM: MRI HEAD WITHOUT CONTRAST TECHNIQUE: Multiplanar, multiecho pulse sequences of the brain and surrounding structures were obtained without intravenous  contrast. COMPARISON:  08/24/2021 FINDINGS: Examination is degraded by motion. Brain: No acute infarct, mass effect or extra-axial collection. There is multifocal hyperintense T2-weighted signal within the white matter. Generalized cerebral volume loss. The midline structures are normal. Vascular: Major flow voids are preserved. Skull and upper cervical spine: Normal calvarium and skull base. Visualized upper cervical spine and soft tissues are normal. Sinuses/Orbits:No paranasal sinus fluid levels or advanced mucosal thickening. No mastoid or middle ear effusion. Normal orbits. IMPRESSION: 1. Motion degraded study without acute intracranial abnormality. 2. Findings of chronic microvascular ischemia and cerebral volume loss. Electronically Signed   By: Ulyses Jarred M.D.   On: 10/26/2021 22:06   DG Chest Port 1 View  Result Date: 10/26/2021 CLINICAL DATA:  Generalized weakness EXAM: PORTABLE CHEST 1 VIEW COMPARISON:  08/23/2021 FINDINGS: No focal consolidation. No pleural effusion or pneumothorax. Stable cardiomegaly. Large hiatal hernia. No acute osseous abnormality. IMPRESSION: 1. No acute cardiopulmonary disease. 2. Large hiatal hernia. Electronically Signed   By: Kathreen Devoid M.D.   On: 10/26/2021 10:57        Scheduled Meds:  amLODipine  5 mg Oral Daily   atorvastatin  10 mg Oral Daily   cyanocobalamin  1,000 mcg Intramuscular Daily   DULoxetine  40 mg Oral Daily   enoxaparin (LOVENOX) injection  40 mg Subcutaneous Q24H   famotidine  20 mg Oral Daily  gabapentin  300 mg Oral QPM   insulin aspart  0-5 Units Subcutaneous QHS   insulin aspart  0-6 Units Subcutaneous TID WC   losartan  100 mg Oral Daily   QUEtiapine  25 mg Oral BID   Continuous Infusions:   LOS: 0 days    Time spent: 40 minutes    Barb Merino, MD [facility] Pager [phone]

## 2021-10-28 DIAGNOSIS — R4587 Impulsiveness: Secondary | ICD-10-CM | POA: Diagnosis present

## 2021-10-28 DIAGNOSIS — E7800 Pure hypercholesterolemia, unspecified: Secondary | ICD-10-CM | POA: Diagnosis present

## 2021-10-28 DIAGNOSIS — Z888 Allergy status to other drugs, medicaments and biological substances status: Secondary | ICD-10-CM | POA: Diagnosis not present

## 2021-10-28 DIAGNOSIS — I129 Hypertensive chronic kidney disease with stage 1 through stage 4 chronic kidney disease, or unspecified chronic kidney disease: Secondary | ICD-10-CM | POA: Diagnosis present

## 2021-10-28 DIAGNOSIS — N183 Chronic kidney disease, stage 3 unspecified: Secondary | ICD-10-CM | POA: Diagnosis present

## 2021-10-28 DIAGNOSIS — Z8249 Family history of ischemic heart disease and other diseases of the circulatory system: Secondary | ICD-10-CM | POA: Diagnosis not present

## 2021-10-28 DIAGNOSIS — M1990 Unspecified osteoarthritis, unspecified site: Secondary | ICD-10-CM | POA: Diagnosis present

## 2021-10-28 DIAGNOSIS — E559 Vitamin D deficiency, unspecified: Secondary | ICD-10-CM | POA: Diagnosis present

## 2021-10-28 DIAGNOSIS — E538 Deficiency of other specified B group vitamins: Secondary | ICD-10-CM | POA: Diagnosis present

## 2021-10-28 DIAGNOSIS — I441 Atrioventricular block, second degree: Secondary | ICD-10-CM | POA: Diagnosis present

## 2021-10-28 DIAGNOSIS — G9341 Metabolic encephalopathy: Secondary | ICD-10-CM | POA: Diagnosis present

## 2021-10-28 DIAGNOSIS — Z885 Allergy status to narcotic agent status: Secondary | ICD-10-CM | POA: Diagnosis not present

## 2021-10-28 DIAGNOSIS — Z91148 Patient's other noncompliance with medication regimen for other reason: Secondary | ICD-10-CM | POA: Diagnosis not present

## 2021-10-28 DIAGNOSIS — G8929 Other chronic pain: Secondary | ICD-10-CM | POA: Diagnosis present

## 2021-10-28 DIAGNOSIS — M5441 Lumbago with sciatica, right side: Secondary | ICD-10-CM | POA: Diagnosis present

## 2021-10-28 DIAGNOSIS — E1122 Type 2 diabetes mellitus with diabetic chronic kidney disease: Secondary | ICD-10-CM | POA: Diagnosis present

## 2021-10-28 DIAGNOSIS — D3A8 Other benign neuroendocrine tumors: Secondary | ICD-10-CM | POA: Diagnosis present

## 2021-10-28 DIAGNOSIS — Z79899 Other long term (current) drug therapy: Secondary | ICD-10-CM | POA: Diagnosis not present

## 2021-10-28 DIAGNOSIS — G9340 Encephalopathy, unspecified: Secondary | ICD-10-CM | POA: Diagnosis not present

## 2021-10-28 DIAGNOSIS — H409 Unspecified glaucoma: Secondary | ICD-10-CM | POA: Diagnosis present

## 2021-10-28 DIAGNOSIS — R410 Disorientation, unspecified: Secondary | ICD-10-CM | POA: Diagnosis present

## 2021-10-28 DIAGNOSIS — Z7984 Long term (current) use of oral hypoglycemic drugs: Secondary | ICD-10-CM | POA: Diagnosis not present

## 2021-10-28 DIAGNOSIS — F32A Depression, unspecified: Secondary | ICD-10-CM | POA: Diagnosis present

## 2021-10-28 DIAGNOSIS — K219 Gastro-esophageal reflux disease without esophagitis: Secondary | ICD-10-CM | POA: Diagnosis present

## 2021-10-28 DIAGNOSIS — C7802 Secondary malignant neoplasm of left lung: Secondary | ICD-10-CM | POA: Diagnosis present

## 2021-10-28 DIAGNOSIS — R531 Weakness: Secondary | ICD-10-CM | POA: Diagnosis present

## 2021-10-28 LAB — GLUCOSE, CAPILLARY
Glucose-Capillary: 133 mg/dL — ABNORMAL HIGH (ref 70–99)
Glucose-Capillary: 230 mg/dL — ABNORMAL HIGH (ref 70–99)
Glucose-Capillary: 166 mg/dL — ABNORMAL HIGH (ref 70–99)
Glucose-Capillary: 183 mg/dL — ABNORMAL HIGH (ref 70–99)

## 2021-10-28 MED ORDER — GUAIFENESIN 100 MG/5ML PO LIQD: 5.0000 mL | ORAL | Status: DC | PRN

## 2021-10-28 MED ORDER — QUETIAPINE FUMARATE 25 MG PO TABS: 25.0000 mg | ORAL_TABLET | Freq: Every day | ORAL | Status: DC

## 2021-10-28 MED ORDER — MELATONIN 5 MG PO TABS
5.0000 mg | ORAL_TABLET | Freq: Once | ORAL | Status: AC
  Administered 2021-10-28: 5 mg via ORAL
  Filled 2021-10-28: qty 1

## 2021-10-28 MED ORDER — QUETIAPINE FUMARATE 25 MG PO TABS
25.0000 mg | ORAL_TABLET | Freq: Every day | ORAL | Status: DC
Start: 2021-10-28 — End: 2021-10-29
  Administered 2021-10-28: 25 mg via ORAL
  Filled 2021-10-28: qty 1

## 2021-10-28 MED ORDER — HYDRALAZINE HCL 20 MG/ML IJ SOLN: 10.0000 mg | INTRAMUSCULAR | Status: DC | PRN

## 2021-10-28 MED ORDER — METOPROLOL TARTRATE 5 MG/5ML IV SOLN: 5.0000 mg | INTRAVENOUS | Status: DC | PRN

## 2021-10-28 NOTE — Progress Notes (Signed)
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  Lives at home alone.  Recently diagnosed depression/cognitive decline however independent and still driving.  For last few days, she has been more lethargic confused and impulsive so brought to ER. In the emergency room afebrile.  Blood pressure stable.  Chest x-ray normal.  Head CT negative for any intracranial abnormality.  CTA chest negative for PE but notable for mosaic pattern of groundglass attenuation consistent with her known carcinomatosis.  MRI brain with motion artifact but no major events.  Lactic acid troponin normal.  Electrolytes normal.  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 Vitamin B12 deficiency - Thus far work-up for infection including UA, chest x-ray has been negative.  TSH, and ammonia levels are normal.  B12 levels are low therefore getting aggressive IV B12 treatment while in the hospital thereafter will be transition to p.o.  Also at bedtime 25 mg of Seroquel CT of the head negative, MRI brain does not show any major acute findings, CTA chest showed mosaic pattern groundglass opacity consistent with carcinomatosis but no evidence of PE.   Essential hypertension - Stable.  Resume losartan and amlodipine.   Type 2 diabetes - A1c 7.3.  On sliding scale insulin while in the hospital.   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  Case discussed with her oncologist whether needs any intervention, however recommended no new interventions.   Mobitz type I second-degree AV block Yesterday admitted on known previously.  Asymptomatic.    DVT prophylaxis: Lovenox  Code Status: Full Code  Family Communication: Daughter is at bedside  Status is: Inpatient Remains inpatient appropriate because: Patient's mentation is still not back to baseline.  She needs alert awake but not oriented.  Typically she is ANO X4 confirmed by daughter at bedside.       Subjective: Patient is feeling better.  Daughter is present at bedside.  Patient is able to answer me her name and where she is but she is still disoriented to date and what is going on.  Daughter at bedside tells me that normally patient is alert awake oriented X4 and typically drives herself and knows her phone number and address which she does not today.  Examination:  General exam: Appears calm and comfortable  Respiratory system: Clear to auscultation. Respiratory effort normal. Cardiovascular system: S1 & S2 heard, RRR. No JVD, murmurs, rubs, gallops or clicks. No pedal edema. Gastrointestinal system: Abdomen is nondistended, soft and nontender. No organomegaly or masses felt. Normal bowel sounds heard. Central nervous system: No focal neurodeficits.  Alert to name and place only.  At baseline she is alert awake oriented X4 Extremities: Symmetric 5 x 5 power. Skin: No rashes, lesions or ulcers Psychiatry: Poor judgment and insight  Objective: Vitals:   10/27/21 1000 10/27/21 1108 10/27/21 2155 10/28/21 0358  BP:  135/72 (!) 150/83 (!) 152/78  Pulse:  85 72 67  Resp:  20 18 18   Temp:  98.1 F (36.7 C) 99.5 F (37.5 C) 99.1 F (37.3 C)  TempSrc:  Oral Oral Oral  SpO2:  95% 93% 94%  Weight: 79.4 kg     Height: 5\' 2"  (1.575 m)       Intake/Output Summary (Last 24 hours) at 10/28/2021 1011 Last data filed at 10/27/2021 1600 Gross per 24 hour   Intake 720 ml  Output --  Net 720 ml   Filed Weights   10/27/21 1000  Weight: 79.4 kg     Data Reviewed:   CBC: Recent Labs  Lab 10/26/21 1042 10/27/21 0629  WBC 5.3 6.9  NEUTROABS 3.5  --   HGB 12.5 13.0  HCT 37.5 40.0  MCV 94.5 95.2  PLT 224 161   Basic Metabolic Panel: Recent Labs  Lab 10/26/21 1042 10/27/21 0534  NA 136 137  K 3.8 3.8  CL 104 104  CO2 26 22  GLUCOSE 156* 148*  BUN 19 14  CREATININE 0.99 0.97  CALCIUM 9.2 9.2  MG 1.8  --   PHOS 3.2  --    GFR: Estimated Creatinine Clearance: 49.6 mL/min (by C-G formula based on SCr of 0.97 mg/dL). Liver Function Tests: Recent Labs  Lab 10/26/21 1042  AST 16  ALT 16  ALKPHOS 73  BILITOT 0.8  PROT 6.5  ALBUMIN 3.8   No results for input(s): LIPASE, AMYLASE in the last 168 hours. Recent Labs  Lab 10/26/21 1042  AMMONIA 14   Coagulation Profile: Recent Labs  Lab 10/26/21 1042  INR 0.9   Cardiac Enzymes: No results for input(s): CKTOTAL, CKMB, CKMBINDEX, TROPONINI in the last 168 hours. BNP (last 3 results) No results for input(s): PROBNP in the last 8760 hours. HbA1C: Recent Labs    10/27/21 0629  HGBA1C 6.8*   CBG: Recent Labs  Lab 10/27/21 0725 10/27/21 1125 10/27/21 1711 10/27/21 2114 10/28/21 0746  GLUCAP 128* 193* 178* 171* 133*   Lipid Profile: No results for input(s): CHOL, HDL, LDLCALC, TRIG, CHOLHDL, LDLDIRECT in the last 72 hours. Thyroid Function Tests: Recent Labs    10/26/21 2014  TSH 1.819   Anemia Panel: Recent Labs    10/27/21 0629  VITAMINB12 128*   Sepsis Labs: Recent Labs  Lab 10/26/21 1042 10/26/21 2011  LATICACIDVEN 1.9 1.4    No results found for this or any previous visit (from the past 240 hour(s)).       Radiology Studies: CT Head Wo Contrast  Result Date: 10/26/2021 CLINICAL DATA:  Mental status change, unknown cause. Generalized weakness. History of lung cancer. EXAM: CT HEAD WITHOUT CONTRAST TECHNIQUE: Contiguous axial  images were obtained from the base of the skull through the vertex without intravenous contrast. RADIATION DOSE REDUCTION: This exam was performed according to the departmental dose-optimization program which includes automated exposure control, adjustment of the mA and/or kV according to patient size and/or use of iterative reconstruction technique. COMPARISON:  Head MRI 08/24/2021 FINDINGS: Brain: There is no evidence of an acute infarct, intracranial hemorrhage, mass, midline shift, or extra-axial fluid collection. There is mild generalized cerebral atrophy. Hypodensities in the cerebral white matter bilaterally are nonspecific but compatible with mild chronic small vessel ischemic disease. Vascular: Calcified atherosclerosis at the skull base. No hyperdense vessel. Skull: No fracture or suspicious osseous lesion. Sinuses/Orbits: Mild left-sided ethmoid air cell mucosal thickening. Clear mastoid air cells. Bilateral cataract extraction. Other: None. IMPRESSION: 1. No evidence of acute intracranial abnormality. 2. Mild chronic small vessel  ischemic disease. Electronically Signed   By: Logan Bores M.D.   On: 10/26/2021 14:12   CT Angio Chest PE W/Cm &/Or Wo Cm  Result Date: 10/26/2021 CLINICAL DATA:  Chest pain and generalized weakness for several weeks. History of lung cancer. EXAM: CT ANGIOGRAPHY CHEST WITH CONTRAST TECHNIQUE: Multidetector CT imaging of the chest was performed using the standard protocol during bolus administration of intravenous contrast. Multiplanar CT image reconstructions and MIPs were obtained to evaluate the vascular anatomy. RADIATION DOSE REDUCTION: This exam was performed according to the departmental dose-optimization program which includes automated exposure control, adjustment of the mA and/or kV according to patient size and/or use of iterative reconstruction technique. CONTRAST:  60mL OMNIPAQUE IOHEXOL 350 MG/ML SOLN COMPARISON:  07/28/2021 FINDINGS: Cardiovascular: The heart  is normal in size. No pericardial effusion. The aorta is normal in caliber. No dissection. Scattered atherosclerotic calcification. No obvious coronary artery calcifications. The pulmonary arterial tree is well opacified. No filling defects to suggest pulmonary embolism. Mediastinum/Nodes: No mediastinal or hilar mass or lymphadenopathy. Small scattered lymph nodes are stable. Stable large hiatal hernia. Lungs/Pleura: Persistent and perhaps slightly more pronounced mosaic pattern of ground-glass attenuation which could be due to small airways disease such as asthma, respiratory bronchiolitis, other reactive airways disease, hypersensitivity pneumonitis or cryptogenic organizing pneumonia. No focal pulmonary infiltrates or pleural effusions. Persistent diffuse pulmonary metastatic disease. The larger nodules are unchanged. 12 mm left upper lobe nodule on image 41/6 is stable. 11 mm left lower lobe nodule on image number 52/6 is stable. 9 mm left lower lobe nodule on image number 94/6 is stable. No new or progressive findings are identified. Upper Abdomen: No significant upper abdominal findings Musculoskeletal: No breast masses, supraclavicular or axillary adenopathy. The bony thorax is intact. Review of the MIP images confirms the above findings. IMPRESSION: 1. No CT findings for pulmonary embolism. 2. Normal caliber thoracic aorta.  No dissection. 3. Persistent and slightly progressive mosaic pattern of ground-glass attenuation which could be due to small airways disease such as asthma, respiratory bronchiolitis, other reactive airways disease, hypersensitivity pneumonitis or cryptogenic organizing pneumonia. 4. Stable diffuse pulmonary metastatic disease. 5. No mediastinal or hilar mass or adenopathy. 6. Large hiatal hernia. 7. Aortic atherosclerosis. Aortic Atherosclerosis (ICD10-I70.0). Electronically Signed   By: Marijo Sanes M.D.   On: 10/26/2021 14:20   MR BRAIN WO CONTRAST  Result Date:  10/26/2021 CLINICAL DATA:  Generalized weakness.  History of lung carcinoma. EXAM: MRI HEAD WITHOUT CONTRAST TECHNIQUE: Multiplanar, multiecho pulse sequences of the brain and surrounding structures were obtained without intravenous contrast. COMPARISON:  08/24/2021 FINDINGS: Examination is degraded by motion. Brain: No acute infarct, mass effect or extra-axial collection. There is multifocal hyperintense T2-weighted signal within the white matter. Generalized cerebral volume loss. The midline structures are normal. Vascular: Major flow voids are preserved. Skull and upper cervical spine: Normal calvarium and skull base. Visualized upper cervical spine and soft tissues are normal. Sinuses/Orbits:No paranasal sinus fluid levels or advanced mucosal thickening. No mastoid or middle ear effusion. Normal orbits. IMPRESSION: 1. Motion degraded study without acute intracranial abnormality. 2. Findings of chronic microvascular ischemia and cerebral volume loss. Electronically Signed   By: Ulyses Jarred M.D.   On: 10/26/2021 22:06   DG Chest Port 1 View  Result Date: 10/26/2021 CLINICAL DATA:  Generalized weakness EXAM: PORTABLE CHEST 1 VIEW COMPARISON:  08/23/2021 FINDINGS: No focal consolidation. No pleural effusion or pneumothorax. Stable cardiomegaly. Large hiatal hernia. No acute osseous abnormality. IMPRESSION: 1. No acute cardiopulmonary disease.  2. Large hiatal hernia. Electronically Signed   By: Kathreen Devoid M.D.   On: 10/26/2021 10:57        Scheduled Meds:  amLODipine  5 mg Oral Daily   atorvastatin  10 mg Oral Daily   cyanocobalamin  1,000 mcg Intramuscular Daily   DULoxetine  40 mg Oral Daily   enoxaparin (LOVENOX) injection  40 mg Subcutaneous Q24H   famotidine  20 mg Oral Daily   gabapentin  300 mg Oral QPM   insulin aspart  0-5 Units Subcutaneous QHS   insulin aspart  0-6 Units Subcutaneous TID WC   losartan  100 mg Oral Daily   QUEtiapine  25 mg Oral BID   Continuous Infusions:    LOS: 0 days   Time spent= 35 mins    Maryori Weide Arsenio Loader, MD [facility]  If 7PM-7AM, please contact night-coverage  10/28/2021, 10:11 AM

## 2021-10-29 DIAGNOSIS — G9340 Encephalopathy, unspecified: Secondary | ICD-10-CM | POA: Diagnosis not present

## 2021-10-29 LAB — BASIC METABOLIC PANEL
Anion gap: 7 (ref 5–15)
BUN: 14 mg/dL (ref 8–23)
CO2: 26 mmol/L (ref 22–32)
Calcium: 9.4 mg/dL (ref 8.9–10.3)
Chloride: 108 mmol/L (ref 98–111)
Creatinine, Ser: 0.84 mg/dL (ref 0.44–1.00)
GFR, Estimated: >60 mL/min (ref >60)
Glucose, Bld: 148 mg/dL — ABNORMAL HIGH (ref 70–99)
Potassium: 3.5 mmol/L (ref 3.5–5.1)
Sodium: 141 mmol/L (ref 135–145)

## 2021-10-29 LAB — CBC
HCT: 35.2 % — ABNORMAL LOW (ref 36.0–46.0)
Hemoglobin: 11.7 g/dL — ABNORMAL LOW (ref 12.0–15.0)
MCH: 31.5 pg (ref 26.0–34.0)
MCHC: 33.2 g/dL (ref 30.0–36.0)
MCV: 94.6 fL (ref 80.0–100.0)
Platelets: 216 10*3/uL (ref 150–400)
RBC: 3.72 MIL/uL — ABNORMAL LOW (ref 3.87–5.11)
RDW: 14.1 % (ref 11.5–15.5)
WBC: 5.5 10*3/uL (ref 4.0–10.5)
nRBC: 0 % (ref 0.0–0.2)

## 2021-10-29 LAB — MAGNESIUM: Magnesium: 1.5 mg/dL — ABNORMAL LOW (ref 1.7–2.4)

## 2021-10-29 LAB — GLUCOSE, CAPILLARY
Glucose-Capillary: 152 mg/dL — ABNORMAL HIGH (ref 70–99)
Glucose-Capillary: 208 mg/dL — ABNORMAL HIGH (ref 70–99)

## 2021-10-29 MED ORDER — VITAMIN B-12 1000 MCG PO TABS
1000.0000 ug | ORAL_TABLET | Freq: Every day | ORAL | 0 refills | Status: AC
Start: 2021-10-29 — End: ?

## 2021-10-29 MED ORDER — MAGNESIUM SULFATE 4 GM/100ML IV SOLN
4.0000 g | Freq: Once | INTRAVENOUS | Status: AC
Start: 2021-10-29 — End: 2021-10-29
  Administered 2021-10-29: 4 g via INTRAVENOUS
  Filled 2021-10-29: qty 100

## 2021-10-29 MED ORDER — POTASSIUM CHLORIDE CRYS ER 20 MEQ PO TBCR
40.0000 meq | EXTENDED_RELEASE_TABLET | Freq: Once | ORAL | Status: AC
  Administered 2021-10-29: 40 meq via ORAL
  Filled 2021-10-29: qty 2

## 2021-10-29 NOTE — Evaluation (Signed)
Occupational Therapy Evaluation Patient Details Name: Michaela Santana MRN: [id number] DOB: Mar 14, 1946 Today's Date: 10/29/2021   History of Present Illness Pt is a 75 y.o. female presenting with acute encephalopathy. PMH significant for stage IV low-grade neuroendocrine carcinoma, type 2 diabetes mellitus, hypertension, and chronic back pain.   Clinical Impression   Michaela Santana is a 76 year old woman who presents with acute encephalopathy. At baseline patient was independent and picking up her grandson from school. On evaluation she is oriented to self, the month and the year but not situation. Some of her responses are odd and no appropriate in context. She is abel to perform ADLs and in room ambulation without physical assistance. She is not aware of her impaired cognition and has poor safety awareness. Patient's daughters in room and will be providing 24/7 supervision at home. Therapist recommended cognitive evaluation with MD and they state this is in the works. Therapist recommended no driving. They have a tub bench and walkers available if needed. No further acute care OT needs.       Recommendations for follow up therapy are one component of a multi-disciplinary discharge planning process, led by the attending physician.  Recommendations may be updated based on patient status, additional functional criteria and insurance authorization.   Follow Up Recommendations  Home health OT    Assistance Recommended at Discharge Frequent or constant Supervision/Assistance  Patient can return home with the following Assistance with cooking/housework;Direct supervision/assist for financial management;Direct supervision/assist for medications management    Functional Status Assessment  Patient has had a recent decline in their functional status and demonstrates the ability to make significant improvements in function in a reasonable and predictable amount of time.  Equipment Recommendations  None  recommended by OT    Recommendations for Other Services       Precautions / Restrictions Precautions Precautions: Fall Restrictions Weight Bearing Restrictions: No      Mobility Bed Mobility               General bed mobility comments: OOB    Transfers Overall transfer level: Needs assistance Equipment used: None               General transfer comment: Ambulated in room with supervision. No overt loss of balance. Has altered gait but daughters report this is normal. Was supposed to start OP PT.      Balance Overall balance assessment: Mild deficits observed, not formally tested                                         ADL either performed or assessed with clinical judgement   ADL Overall ADL's : Modified independent                                             Vision   Vision Assessment?: No apparent visual deficits     Perception     Praxis      Pertinent Vitals/Pain Pain Assessment Pain Assessment: No/denies pain     Hand Dominance     Extremity/Trunk Assessment Upper Extremity Assessment Upper Extremity Assessment: Overall WFL for tasks assessed   Lower Extremity Assessment Lower Extremity Assessment: Defer to PT evaluation   Cervical / Trunk Assessment Cervical / Trunk Assessment: Normal  Communication Communication Communication: No difficulties   Cognition Arousal/Alertness: Awake/alert Behavior During Therapy: WFL for tasks assessed/performed Overall Cognitive Status: Impaired/Different from baseline Area of Impairment: Orientation, Memory, Following commands, Safety/judgement, Awareness, Problem solving                 Orientation Level: Time, Situation, Disoriented to   Memory: Decreased short-term memory Following Commands: Follows one step commands consistently Safety/Judgement: Decreased awareness of deficits, Decreased awareness of safety Awareness: Emergent Problem Solving:  Requires verbal cues       General Comments       Exercises     Shoulder Instructions      Home Living Family/patient expects to be discharged to:: Private residence Living Arrangements: Alone Available Help at Discharge: Family Type of Home: House Home Access: Stairs to enter Technical brewer of Steps: 1 threshold Entrance Stairs-Rails: None Home Layout: One level     Bathroom Shower/Tub: Tub/shower unit (daughter working on getting walk-in shower changed)   Bathroom Toilet: Standard Bathroom Accessibility: Yes   Home Equipment: Conservation officer, nature (2 wheels);Rollator (4 wheels);Cane - single point;Tub bench (can get grab bars if needed)   Additional Comments: has multiple daughters that live close. Was going to start HHPT and SLP, but did not get chance before coming to hospital.      Prior Functioning/Environment Prior Level of Function : Independent/Modified Independent;Driving             Mobility Comments: no AD use at baseline. fell getting OOB to go to the bathroom, landed against wall, same day slid out of the bed.          OT Problem List: Decreased cognition;Decreased safety awareness;Decreased knowledge of use of DME or AE      OT Treatment/Interventions:      OT Goals(Current goals can be found in the care plan section) Acute Rehab OT Goals OT Goal Formulation: All assessment and education complete, DC therapy  OT Frequency:      Co-evaluation              AM-PAC OT "6 Clicks" Daily Activity     Outcome Measure Help from another person eating meals?: None Help from another person taking care of personal grooming?: None Help from another person toileting, which includes using toliet, bedpan, or urinal?: None Help from another person bathing (including washing, rinsing, drying)?: None Help from another person to put on and taking off regular upper body clothing?: None Help from another person to put on and taking off regular lower body  clothing?: None 6 Click Score: 24   End of Session Nurse Communication: Mobility status  Activity Tolerance: Patient tolerated treatment well Patient left: in chair;with call bell/phone within reach;with chair alarm set  OT Visit Diagnosis: Other symptoms and signs involving cognitive function                Time: 7121-9758 OT Time Calculation (min): 11 min Charges:  OT General Charges $OT Visit: 1 Visit OT Evaluation $OT Eval Low Complexity: 1 Low  Shmiel Morton, OTR/L [place]  Office [phone] Pager: [phone]   Lenward Chancellor 10/29/2021, 2:43 PM

## 2021-10-29 NOTE — Discharge Summary (Signed)
Physician Discharge Summary  Michaela Santana OEU:[id number] DOB: [birth year] DOA: 10/26/2021  PCP: [name], MD  Admit date: 10/26/2021 Discharge date: 10/29/2021  Admitted From: Home Disposition: Home  Recommendations for Outpatient Follow-up:  Follow up with PCP in 1-2 weeks Please obtain BMP/CBC in one week your next doctors visit.  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  Lives at home alone.  Recently diagnosed depression/cognitive decline however independent and still driving.  For last few days, she has been more lethargic confused and impulsive so brought to ER. In the emergency room afebrile.  Blood pressure stable.  Chest x-ray normal.  Head CT negative for any intracranial abnormality.  CTA chest negative for PE but notable for mosaic pattern of groundglass attenuation consistent with her known carcinomatosis.  MRI brain with motion artifact but no major events.  Lactic acid troponin normal.  Electrolytes normal.  Ammonia and TSH normal.  She was noted to have low vitamin B12 level therefore received IV treatment during the hospitalization with plans to transition to p.o.  She was also started on bedtime Seroquel which she tolerated well. Today's mentation has improved quite a bit.  Daughter is present at bedside.  She is stable for discharge.     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 Vitamin B12 deficiency - Resolved.  Thus far work-up for infection including UA, chest x-ray has been negative.  TSH, and ammonia levels are normal.  B12 levels are low therefore getting aggressive IV B12 treatment while in the hospital thereafter will be transition to p.o.  Also at bedtime 25 mg of Seroquel.  She will need outpatient recheck B12 level in about 3-4 weeks. CT of the head negative, MRI brain does not show any major acute findings, CTA chest showed mosaic pattern groundglass opacity consistent with carcinomatosis but no evidence of PE.   Essential hypertension - Stable.  Resume losartan and amlodipine.   Type 2 diabetes - A1c 7.3.     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  Case discussed with her oncologist whether needs any intervention, however recommended no new interventions.  Follow-up outpatient   Mobitz type I second-degree AV block Asymptomatic     Assessment and Plan: No notes have been filed under this hospital service. Service: Hospitalist      Body mass index is 32.03 kg/m.       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  Daughter at bedside.  No acute events overnight.  Discharge Exam: Vitals:  10/28/21 2007 10/29/21 0419  BP: (!) 160/108 (!) 146/79  Pulse: 79 71  Resp: 16 16  Temp: 98.4 F (36.9 C) 98.3 F (36.8 C)  SpO2: 97% 96%   Vitals:   10/28/21 0824 10/28/21 1506 10/28/21 2007 10/29/21 0419  BP:  (!) 149/89 (!) 160/108 (!) 146/79  Pulse:  81 79 71  Resp:   16 16 16   Temp: 98.9 F (37.2 C) 97.8 F (36.6 C) 98.4 F (36.9 C) 98.3 F (36.8 C)  TempSrc: Axillary Oral Oral Oral  SpO2:  98% 97% 96%  Weight:      Height:        General: Pt is alert, awake, not in acute distress Cardiovascular: RRR, S1/S2 +, no rubs, no gallops Respiratory: CTA bilaterally, no wheezing, no rhonchi Abdominal: Soft, NT, ND, bowel sounds + Extremities: no edema, no cyanosis  Discharge Instructions   Allergies as of 10/29/2021       Reactions   Codeine Hypertension   Alprazolam Other (See Comments)   lethargic        Medication List     TAKE these medications    acetaminophen 650 MG CR tablet Commonly known as: TYLENOL Take 650 mg by mouth every 8 (eight) hours as needed for pain.   amLODipine 5 MG tablet Commonly known as: NORVASC Take 5 mg by mouth daily.   atorvastatin 10 MG tablet Commonly known as: LIPITOR Take 10 mg by mouth daily.   DULoxetine HCl 40 MG Cpep Take 40 mg by mouth daily.   famotidine 20 MG tablet Commonly known as: PEPCID Take 20 mg by mouth daily.   ferrous sulfate 325 (65 FE) MG EC tablet Take 325 mg by mouth daily.   freestyle lancets   FreeStyle Lite Devi USE UTD   FREESTYLE LITE test strip Generic drug: glucose blood USE TO CHECK FASTING GLUCOSE IN THE MORNING AND 2 HOURS AFTER DINNER OR LUNCH   gabapentin 300 MG capsule Commonly known as: NEURONTIN Take 300 mg by mouth every evening.   glipiZIDE 5 MG 24 hr tablet Commonly known as: GLUCOTROL XL Take 5 mg by mouth daily with breakfast.   loratadine 10 MG tablet Commonly known as: CLARITIN Take 10 mg by mouth daily as needed for allergies.   losartan 100 MG tablet Commonly known as: COZAAR Take 100 mg by mouth daily.   magnesium oxide 400 (240 Mg) MG tablet Commonly known as: MAG-OX Take 800 mg by mouth 2 (two) times daily.   metFORMIN 500 MG 24 hr tablet Commonly known as: GLUCOPHAGE-XR Take 500-1,000 mg by mouth See admin  instructions. Take 1 tablet ( 500 mg) in the morning and 2 tablets (1000 mg) in the evening   ondansetron 8 MG tablet Commonly known as: ZOFRAN Take 8 mg by mouth every 8 (eight) hours as needed for nausea/vomiting.   Systane Balance 0.6 % Soln Generic drug: Propylene Glycol Place 1 drop into both eyes daily as needed (dry eyes).   vitamin B-12 1000 MCG tablet Commonly known as: CYANOCOBALAMIN Take 1 tablet (1,000 mcg total) by mouth daily.   Vitamin D (Ergocalciferol) 1.25 MG (50000 UNIT) Caps capsule Commonly known as: DRISDOL Take 50,000 Units by mouth every Friday.        Follow-up Information     [name], MD. Schedule an appointment as soon as possible for a visit in 3 days.   Specialty: Family Medicine Contact information: [address] [place] [address] [phone]  [name], MD .   Specialty: Family Medicine Contact information: [place] [address] [phone]         [name], MD Follow up in 1 week(s).   Specialty: Family Medicine Contact information: [address] [place] [address] [phone]                Allergies  Allergen Reactions   Codeine Hypertension   Alprazolam Other (See Comments)    lethargic    You were cared for by a hospitalist during your hospital stay. If you have any questions about your discharge medications or the care you received while you were in the hospital after you are discharged, you can call the unit and asked to speak with the hospitalist on call if the hospitalist that took care of you is not available. Once you are discharged, your primary care physician will handle any further medical issues. Please note that no refills for any discharge medications will be authorized once you are discharged, as it is imperative that you return to your primary care physician (or establish a relationship with a primary care  physician if you do not have one) for your aftercare needs so that they can reassess your need for medications and monitor your lab values.   Procedures/Studies: CT Head Wo Contrast  Result Date: 10/26/2021 CLINICAL DATA:  Mental status change, unknown cause. Generalized weakness. History of lung cancer. EXAM: CT HEAD WITHOUT CONTRAST TECHNIQUE: Contiguous axial images were obtained from the base of the skull through the vertex without intravenous contrast. RADIATION DOSE REDUCTION: This exam was performed according to the departmental dose-optimization program which includes automated exposure control, adjustment of the mA and/or kV according to patient size and/or use of iterative reconstruction technique. COMPARISON:  Head MRI 08/24/2021 FINDINGS: Brain: There is no evidence of an acute infarct, intracranial hemorrhage, mass, midline shift, or extra-axial fluid collection. There is mild generalized cerebral atrophy. Hypodensities in the cerebral white matter bilaterally are nonspecific but compatible with mild chronic small vessel ischemic disease. Vascular: Calcified atherosclerosis at the skull base. No hyperdense vessel. Skull: No fracture or suspicious osseous lesion. Sinuses/Orbits: Mild left-sided ethmoid air cell mucosal thickening. Clear mastoid air cells. Bilateral cataract extraction. Other: None. IMPRESSION: 1. No evidence of acute intracranial abnormality. 2. Mild chronic small vessel ischemic disease. Electronically Signed   By: Logan Bores M.D.   On: 10/26/2021 14:12   CT Angio Chest PE W/Cm &/Or Wo Cm  Result Date: 10/26/2021 CLINICAL DATA:  Chest pain and generalized weakness for several weeks. History of lung cancer. EXAM: CT ANGIOGRAPHY CHEST WITH CONTRAST TECHNIQUE: Multidetector CT imaging of the chest was performed using the standard protocol during bolus administration of intravenous contrast. Multiplanar CT image reconstructions and MIPs were obtained to evaluate the vascular  anatomy. RADIATION DOSE REDUCTION: This exam was performed according to the departmental dose-optimization program which includes automated exposure control, adjustment of the mA and/or kV according to patient size and/or use of iterative reconstruction technique. CONTRAST:  37mL OMNIPAQUE IOHEXOL 350 MG/ML SOLN COMPARISON:  07/28/2021 FINDINGS: Cardiovascular: The heart is normal in size. No pericardial effusion. The aorta is normal in caliber. No dissection. Scattered atherosclerotic calcification. No obvious coronary artery calcifications. The pulmonary arterial tree is well opacified. No filling defects to suggest pulmonary embolism. Mediastinum/Nodes: No mediastinal or hilar mass or lymphadenopathy. Small scattered lymph nodes are stable. Stable large hiatal hernia. Lungs/Pleura: Persistent and perhaps slightly more pronounced mosaic pattern of ground-glass  attenuation which could be due to small airways disease such as asthma, respiratory bronchiolitis, other reactive airways disease, hypersensitivity pneumonitis or cryptogenic organizing pneumonia. No focal pulmonary infiltrates or pleural effusions. Persistent diffuse pulmonary metastatic disease. The larger nodules are unchanged. 12 mm left upper lobe nodule on image 41/6 is stable. 11 mm left lower lobe nodule on image number 52/6 is stable. 9 mm left lower lobe nodule on image number 94/6 is stable. No new or progressive findings are identified. Upper Abdomen: No significant upper abdominal findings Musculoskeletal: No breast masses, supraclavicular or axillary adenopathy. The bony thorax is intact. Review of the MIP images confirms the above findings. IMPRESSION: 1. No CT findings for pulmonary embolism. 2. Normal caliber thoracic aorta.  No dissection. 3. Persistent and slightly progressive mosaic pattern of ground-glass attenuation which could be due to small airways disease such as asthma, respiratory bronchiolitis, other reactive airways disease,  hypersensitivity pneumonitis or cryptogenic organizing pneumonia. 4. Stable diffuse pulmonary metastatic disease. 5. No mediastinal or hilar mass or adenopathy. 6. Large hiatal hernia. 7. Aortic atherosclerosis. Aortic Atherosclerosis (ICD10-I70.0). Electronically Signed   By: Marijo Sanes M.D.   On: 10/26/2021 14:20   MR BRAIN WO CONTRAST  Result Date: 10/26/2021 CLINICAL DATA:  Generalized weakness.  History of lung carcinoma. EXAM: MRI HEAD WITHOUT CONTRAST TECHNIQUE: Multiplanar, multiecho pulse sequences of the brain and surrounding structures were obtained without intravenous contrast. COMPARISON:  08/24/2021 FINDINGS: Examination is degraded by motion. Brain: No acute infarct, mass effect or extra-axial collection. There is multifocal hyperintense T2-weighted signal within the white matter. Generalized cerebral volume loss. The midline structures are normal. Vascular: Major flow voids are preserved. Skull and upper cervical spine: Normal calvarium and skull base. Visualized upper cervical spine and soft tissues are normal. Sinuses/Orbits:No paranasal sinus fluid levels or advanced mucosal thickening. No mastoid or middle ear effusion. Normal orbits. IMPRESSION: 1. Motion degraded study without acute intracranial abnormality. 2. Findings of chronic microvascular ischemia and cerebral volume loss. Electronically Signed   By: Ulyses Jarred M.D.   On: 10/26/2021 22:06   DG Chest Port 1 View  Result Date: 10/26/2021 CLINICAL DATA:  Generalized weakness EXAM: PORTABLE CHEST 1 VIEW COMPARISON:  08/23/2021 FINDINGS: No focal consolidation. No pleural effusion or pneumothorax. Stable cardiomegaly. Large hiatal hernia. No acute osseous abnormality. IMPRESSION: 1. No acute cardiopulmonary disease. 2. Large hiatal hernia. Electronically Signed   By: Kathreen Devoid M.D.   On: 10/26/2021 10:57     The results of significant diagnostics from this hospitalization (including imaging, microbiology, ancillary and  laboratory) are listed below for reference.     Microbiology: No results found for this or any previous visit (from the past 240 hour(s)).   Labs: BNP (last 3 results) No results for input(s): BNP in the last 8760 hours. Basic Metabolic Panel: Recent Labs  Lab 10/26/21 1042 10/27/21 0534 10/29/21 0459  NA 136 137 141  K 3.8 3.8 3.5  CL 104 104 108  CO2 26 22 26   GLUCOSE 156* 148* 148*  BUN 19 14 14   CREATININE 0.99 0.97 0.84  CALCIUM 9.2 9.2 9.4  MG 1.8  --  1.5*  PHOS 3.2  --   --    Liver Function Tests: Recent Labs  Lab 10/26/21 1042  AST 16  ALT 16  ALKPHOS 73  BILITOT 0.8  PROT 6.5  ALBUMIN 3.8   No results for input(s): LIPASE, AMYLASE in the last 168 hours. Recent Labs  Lab 10/26/21 1042  AMMONIA 14  CBC: Recent Labs  Lab 10/26/21 1042 10/27/21 0629 10/29/21 0459  WBC 5.3 6.9 5.5  NEUTROABS 3.5  --   --   HGB 12.5 13.0 11.7*  HCT 37.5 40.0 35.2*  MCV 94.5 95.2 94.6  PLT 224 221 216   Cardiac Enzymes: No results for input(s): CKTOTAL, CKMB, CKMBINDEX, TROPONINI in the last 168 hours. BNP: Invalid input(s): POCBNP CBG: Recent Labs  Lab 10/28/21 0746 10/28/21 1157 10/28/21 1631 10/28/21 2135 10/29/21 0842  GLUCAP 133* 230* 166* 183* 152*   D-Dimer No results for input(s): DDIMER in the last 72 hours. Hgb A1c Recent Labs    10/27/21 0629  HGBA1C 6.8*   Lipid Profile No results for input(s): CHOL, HDL, LDLCALC, TRIG, CHOLHDL, LDLDIRECT in the last 72 hours. Thyroid function studies Recent Labs    10/26/21 2014  TSH 1.819   Anemia work up Recent Labs    10/27/21 0629  VITAMINB12 128*   Urinalysis    Component Value Date/Time   COLORURINE YELLOW 10/26/2021 1610   APPEARANCEUR CLEAR 10/26/2021 1610   LABSPEC 1.032 (H) 10/26/2021 1610   PHURINE 6.0 10/26/2021 1610   GLUCOSEU NEGATIVE 10/26/2021 1610   [place] 10/26/2021 1610   [place] 10/26/2021 1610   [place] 10/26/2021 1610    PROTEINUR 30 (A) 10/26/2021 1610   NITRITE NEGATIVE 10/26/2021 1610   LEUKOCYTESUR NEGATIVE 10/26/2021 1610   Sepsis Labs Invalid input(s): PROCALCITONIN,  WBC,  LACTICIDVEN Microbiology No results found for this or any previous visit (from the past 240 hour(s)).   Time coordinating discharge:  I have spent 35 minutes face to face with the patient and on the ward discussing the patients care, assessment, plan and disposition with other care givers. >50% of the time was devoted counseling the patient about the risks and benefits of treatment/Discharge disposition and coordinating care.   SIGNED:   Damita Lack, MD  [facility] 10/29/2021, 10:24 AM   If 7PM-7AM, please contact night-coverage

## 2021-10-29 NOTE — TOC Transition Note (Signed)
Transition of Care [facility]) - CM/SW Discharge Note   Patient Details  Name: Michaela Santana MRN: [id number] Date of Birth: [birth year]  Transition of Care [facility] CM/SW Contact:  Ross Ludwig, LCSW Phone Number: 10/29/2021, 12:58 PM   Clinical Narrative:     CSW spoke to patient's daughter [name] [phone], she said patient was supposed to start with [facility] PT and OT last week through [facility] however patient ended up coming to the hospital.  CSW requested that physician put orders in for [facility] PT and OT.  CSW asked if there is any equipment they will need, daughter said no they already have it.  Patient will be going home with home health through [facility].  CSW signing off please reconsult with any other social work needs, home health agency has been notified of planned discharge.    Final next level of care: [place] Barriers to Discharge: Barriers Resolved   Patient Goals and CMS Choice Patient states their goals for this hospitalization and ongoing recovery are:: To return back home with home health. CMS Medicare.gov Compare Post Acute Care list provided to:: Patient Represenative (must comment) Choice offered to / list presented to : Adult Children  Discharge Placement                       Discharge Plan and Services                          HH Arranged: PT HH Agency: [facility] Date [place] Agency Contacted: 10/29/21 Time [place]: 81 Representative spoke with at [place]: [place] Determinants of Health ([place]) Interventions     Readmission Risk Interventions     View : No data to display.

## 2021-10-30 DIAGNOSIS — N183 Chronic kidney disease, stage 3 unspecified: Secondary | ICD-10-CM | POA: Diagnosis not present

## 2021-10-30 DIAGNOSIS — M170 Bilateral primary osteoarthritis of knee: Secondary | ICD-10-CM | POA: Diagnosis not present

## 2021-10-30 DIAGNOSIS — I129 Hypertensive chronic kidney disease with stage 1 through stage 4 chronic kidney disease, or unspecified chronic kidney disease: Secondary | ICD-10-CM | POA: Diagnosis not present

## 2021-10-30 DIAGNOSIS — E1122 Type 2 diabetes mellitus with diabetic chronic kidney disease: Secondary | ICD-10-CM | POA: Diagnosis not present

## 2021-10-30 DIAGNOSIS — E785 Hyperlipidemia, unspecified: Secondary | ICD-10-CM | POA: Diagnosis not present

## 2021-10-30 DIAGNOSIS — M5441 Lumbago with sciatica, right side: Secondary | ICD-10-CM | POA: Diagnosis not present

## 2021-11-01 DIAGNOSIS — E538 Deficiency of other specified B group vitamins: Secondary | ICD-10-CM | POA: Diagnosis not present

## 2021-11-01 DIAGNOSIS — R413 Other amnesia: Secondary | ICD-10-CM | POA: Diagnosis not present

## 2021-11-01 DIAGNOSIS — J0100 Acute maxillary sinusitis, unspecified: Secondary | ICD-10-CM | POA: Diagnosis not present

## 2021-11-01 DIAGNOSIS — R410 Disorientation, unspecified: Secondary | ICD-10-CM | POA: Diagnosis not present

## 2021-11-07 DIAGNOSIS — E785 Hyperlipidemia, unspecified: Secondary | ICD-10-CM | POA: Diagnosis not present

## 2021-11-07 DIAGNOSIS — E1122 Type 2 diabetes mellitus with diabetic chronic kidney disease: Secondary | ICD-10-CM | POA: Diagnosis not present

## 2021-11-07 DIAGNOSIS — M5441 Lumbago with sciatica, right side: Secondary | ICD-10-CM | POA: Diagnosis not present

## 2021-11-07 DIAGNOSIS — I129 Hypertensive chronic kidney disease with stage 1 through stage 4 chronic kidney disease, or unspecified chronic kidney disease: Secondary | ICD-10-CM | POA: Diagnosis not present

## 2021-11-07 DIAGNOSIS — N183 Chronic kidney disease, stage 3 unspecified: Secondary | ICD-10-CM | POA: Diagnosis not present

## 2021-11-07 DIAGNOSIS — M170 Bilateral primary osteoarthritis of knee: Secondary | ICD-10-CM | POA: Diagnosis not present

## 2021-11-08 DIAGNOSIS — M5441 Lumbago with sciatica, right side: Secondary | ICD-10-CM | POA: Diagnosis not present

## 2021-11-08 DIAGNOSIS — I129 Hypertensive chronic kidney disease with stage 1 through stage 4 chronic kidney disease, or unspecified chronic kidney disease: Secondary | ICD-10-CM | POA: Diagnosis not present

## 2021-11-08 DIAGNOSIS — M170 Bilateral primary osteoarthritis of knee: Secondary | ICD-10-CM | POA: Diagnosis not present

## 2021-11-08 DIAGNOSIS — E785 Hyperlipidemia, unspecified: Secondary | ICD-10-CM | POA: Diagnosis not present

## 2021-11-08 DIAGNOSIS — N183 Chronic kidney disease, stage 3 unspecified: Secondary | ICD-10-CM | POA: Diagnosis not present

## 2021-11-08 DIAGNOSIS — E1122 Type 2 diabetes mellitus with diabetic chronic kidney disease: Secondary | ICD-10-CM | POA: Diagnosis not present

## 2021-11-10 DIAGNOSIS — M170 Bilateral primary osteoarthritis of knee: Secondary | ICD-10-CM | POA: Diagnosis not present

## 2021-11-10 DIAGNOSIS — E785 Hyperlipidemia, unspecified: Secondary | ICD-10-CM | POA: Diagnosis not present

## 2021-11-10 DIAGNOSIS — N183 Chronic kidney disease, stage 3 unspecified: Secondary | ICD-10-CM | POA: Diagnosis not present

## 2021-11-10 DIAGNOSIS — E1122 Type 2 diabetes mellitus with diabetic chronic kidney disease: Secondary | ICD-10-CM | POA: Diagnosis not present

## 2021-11-10 DIAGNOSIS — M5441 Lumbago with sciatica, right side: Secondary | ICD-10-CM | POA: Diagnosis not present

## 2021-11-10 DIAGNOSIS — I129 Hypertensive chronic kidney disease with stage 1 through stage 4 chronic kidney disease, or unspecified chronic kidney disease: Secondary | ICD-10-CM | POA: Diagnosis not present

## 2021-11-13 DIAGNOSIS — E785 Hyperlipidemia, unspecified: Secondary | ICD-10-CM | POA: Diagnosis not present

## 2021-11-13 DIAGNOSIS — I129 Hypertensive chronic kidney disease with stage 1 through stage 4 chronic kidney disease, or unspecified chronic kidney disease: Secondary | ICD-10-CM | POA: Diagnosis not present

## 2021-11-13 DIAGNOSIS — E1122 Type 2 diabetes mellitus with diabetic chronic kidney disease: Secondary | ICD-10-CM | POA: Diagnosis not present

## 2021-11-13 DIAGNOSIS — M5441 Lumbago with sciatica, right side: Secondary | ICD-10-CM | POA: Diagnosis not present

## 2021-11-13 DIAGNOSIS — N183 Chronic kidney disease, stage 3 unspecified: Secondary | ICD-10-CM | POA: Diagnosis not present

## 2021-11-13 DIAGNOSIS — M170 Bilateral primary osteoarthritis of knee: Secondary | ICD-10-CM | POA: Diagnosis not present

## 2021-11-14 DIAGNOSIS — E1122 Type 2 diabetes mellitus with diabetic chronic kidney disease: Secondary | ICD-10-CM | POA: Diagnosis not present

## 2021-11-14 DIAGNOSIS — M5441 Lumbago with sciatica, right side: Secondary | ICD-10-CM | POA: Diagnosis not present

## 2021-11-14 DIAGNOSIS — I129 Hypertensive chronic kidney disease with stage 1 through stage 4 chronic kidney disease, or unspecified chronic kidney disease: Secondary | ICD-10-CM | POA: Diagnosis not present

## 2021-11-14 DIAGNOSIS — M170 Bilateral primary osteoarthritis of knee: Secondary | ICD-10-CM | POA: Diagnosis not present

## 2021-11-14 DIAGNOSIS — E785 Hyperlipidemia, unspecified: Secondary | ICD-10-CM | POA: Diagnosis not present

## 2021-11-14 DIAGNOSIS — N183 Chronic kidney disease, stage 3 unspecified: Secondary | ICD-10-CM | POA: Diagnosis not present

## 2021-11-15 DIAGNOSIS — N183 Chronic kidney disease, stage 3 unspecified: Secondary | ICD-10-CM | POA: Diagnosis not present

## 2021-11-15 DIAGNOSIS — I129 Hypertensive chronic kidney disease with stage 1 through stage 4 chronic kidney disease, or unspecified chronic kidney disease: Secondary | ICD-10-CM | POA: Diagnosis not present

## 2021-11-15 DIAGNOSIS — E785 Hyperlipidemia, unspecified: Secondary | ICD-10-CM | POA: Diagnosis not present

## 2021-11-15 DIAGNOSIS — M5441 Lumbago with sciatica, right side: Secondary | ICD-10-CM | POA: Diagnosis not present

## 2021-11-15 DIAGNOSIS — E1122 Type 2 diabetes mellitus with diabetic chronic kidney disease: Secondary | ICD-10-CM | POA: Diagnosis not present

## 2021-11-15 DIAGNOSIS — M170 Bilateral primary osteoarthritis of knee: Secondary | ICD-10-CM | POA: Diagnosis not present

## 2021-11-17 DIAGNOSIS — R399 Unspecified symptoms and signs involving the genitourinary system: Secondary | ICD-10-CM | POA: Diagnosis not present

## 2021-11-20 DIAGNOSIS — Z9181 History of falling: Secondary | ICD-10-CM | POA: Diagnosis not present

## 2021-11-20 DIAGNOSIS — M5441 Lumbago with sciatica, right side: Secondary | ICD-10-CM | POA: Diagnosis not present

## 2021-11-20 DIAGNOSIS — M1909 Primary osteoarthritis, other specified site: Secondary | ICD-10-CM | POA: Diagnosis not present

## 2021-11-20 DIAGNOSIS — N281 Cyst of kidney, acquired: Secondary | ICD-10-CM | POA: Diagnosis not present

## 2021-11-20 DIAGNOSIS — Z7984 Long term (current) use of oral hypoglycemic drugs: Secondary | ICD-10-CM | POA: Diagnosis not present

## 2021-11-20 DIAGNOSIS — D519 Vitamin B12 deficiency anemia, unspecified: Secondary | ICD-10-CM | POA: Diagnosis not present

## 2021-11-20 DIAGNOSIS — D649 Anemia, unspecified: Secondary | ICD-10-CM | POA: Diagnosis not present

## 2021-11-20 DIAGNOSIS — H9190 Unspecified hearing loss, unspecified ear: Secondary | ICD-10-CM | POA: Diagnosis not present

## 2021-11-20 DIAGNOSIS — N183 Chronic kidney disease, stage 3 unspecified: Secondary | ICD-10-CM | POA: Diagnosis not present

## 2021-11-20 DIAGNOSIS — G9340 Encephalopathy, unspecified: Secondary | ICD-10-CM | POA: Diagnosis not present

## 2021-11-20 DIAGNOSIS — E559 Vitamin D deficiency, unspecified: Secondary | ICD-10-CM | POA: Diagnosis not present

## 2021-11-20 DIAGNOSIS — F32A Depression, unspecified: Secondary | ICD-10-CM | POA: Diagnosis not present

## 2021-11-20 DIAGNOSIS — I129 Hypertensive chronic kidney disease with stage 1 through stage 4 chronic kidney disease, or unspecified chronic kidney disease: Secondary | ICD-10-CM | POA: Diagnosis not present

## 2021-11-20 DIAGNOSIS — E785 Hyperlipidemia, unspecified: Secondary | ICD-10-CM | POA: Diagnosis not present

## 2021-11-20 DIAGNOSIS — E7800 Pure hypercholesterolemia, unspecified: Secondary | ICD-10-CM | POA: Diagnosis not present

## 2021-11-20 DIAGNOSIS — M170 Bilateral primary osteoarthritis of knee: Secondary | ICD-10-CM | POA: Diagnosis not present

## 2021-11-20 DIAGNOSIS — H401131 Primary open-angle glaucoma, bilateral, mild stage: Secondary | ICD-10-CM | POA: Diagnosis not present

## 2021-11-20 DIAGNOSIS — E1122 Type 2 diabetes mellitus with diabetic chronic kidney disease: Secondary | ICD-10-CM | POA: Diagnosis not present

## 2021-11-21 DIAGNOSIS — M1909 Primary osteoarthritis, other specified site: Secondary | ICD-10-CM | POA: Diagnosis not present

## 2021-11-21 DIAGNOSIS — E1122 Type 2 diabetes mellitus with diabetic chronic kidney disease: Secondary | ICD-10-CM | POA: Diagnosis not present

## 2021-11-21 DIAGNOSIS — M170 Bilateral primary osteoarthritis of knee: Secondary | ICD-10-CM | POA: Diagnosis not present

## 2021-11-21 DIAGNOSIS — G9340 Encephalopathy, unspecified: Secondary | ICD-10-CM | POA: Diagnosis not present

## 2021-11-21 DIAGNOSIS — N183 Chronic kidney disease, stage 3 unspecified: Secondary | ICD-10-CM | POA: Diagnosis not present

## 2021-11-21 DIAGNOSIS — I129 Hypertensive chronic kidney disease with stage 1 through stage 4 chronic kidney disease, or unspecified chronic kidney disease: Secondary | ICD-10-CM | POA: Diagnosis not present

## 2021-11-27 DIAGNOSIS — I129 Hypertensive chronic kidney disease with stage 1 through stage 4 chronic kidney disease, or unspecified chronic kidney disease: Secondary | ICD-10-CM | POA: Diagnosis not present

## 2021-11-27 DIAGNOSIS — N183 Chronic kidney disease, stage 3 unspecified: Secondary | ICD-10-CM | POA: Diagnosis not present

## 2021-11-27 DIAGNOSIS — E1122 Type 2 diabetes mellitus with diabetic chronic kidney disease: Secondary | ICD-10-CM | POA: Diagnosis not present

## 2021-11-27 DIAGNOSIS — G9340 Encephalopathy, unspecified: Secondary | ICD-10-CM | POA: Diagnosis not present

## 2021-11-27 DIAGNOSIS — M170 Bilateral primary osteoarthritis of knee: Secondary | ICD-10-CM | POA: Diagnosis not present

## 2021-11-27 DIAGNOSIS — M1909 Primary osteoarthritis, other specified site: Secondary | ICD-10-CM | POA: Diagnosis not present

## 2021-11-30 DIAGNOSIS — N183 Chronic kidney disease, stage 3 unspecified: Secondary | ICD-10-CM | POA: Diagnosis not present

## 2021-11-30 DIAGNOSIS — I129 Hypertensive chronic kidney disease with stage 1 through stage 4 chronic kidney disease, or unspecified chronic kidney disease: Secondary | ICD-10-CM | POA: Diagnosis not present

## 2021-11-30 DIAGNOSIS — M170 Bilateral primary osteoarthritis of knee: Secondary | ICD-10-CM | POA: Diagnosis not present

## 2021-11-30 DIAGNOSIS — E1122 Type 2 diabetes mellitus with diabetic chronic kidney disease: Secondary | ICD-10-CM | POA: Diagnosis not present

## 2021-11-30 DIAGNOSIS — G9340 Encephalopathy, unspecified: Secondary | ICD-10-CM | POA: Diagnosis not present

## 2021-11-30 DIAGNOSIS — M1909 Primary osteoarthritis, other specified site: Secondary | ICD-10-CM | POA: Diagnosis not present

## 2021-12-01 ENCOUNTER — Encounter: Payer: Self-pay | Admitting: *Deleted

## 2021-12-01 DIAGNOSIS — N183 Chronic kidney disease, stage 3 unspecified: Secondary | ICD-10-CM | POA: Diagnosis not present

## 2021-12-01 DIAGNOSIS — G9340 Encephalopathy, unspecified: Secondary | ICD-10-CM | POA: Diagnosis not present

## 2021-12-01 DIAGNOSIS — I129 Hypertensive chronic kidney disease with stage 1 through stage 4 chronic kidney disease, or unspecified chronic kidney disease: Secondary | ICD-10-CM | POA: Diagnosis not present

## 2021-12-01 DIAGNOSIS — M170 Bilateral primary osteoarthritis of knee: Secondary | ICD-10-CM | POA: Diagnosis not present

## 2021-12-01 DIAGNOSIS — E1122 Type 2 diabetes mellitus with diabetic chronic kidney disease: Secondary | ICD-10-CM | POA: Diagnosis not present

## 2021-12-01 DIAGNOSIS — M1909 Primary osteoarthritis, other specified site: Secondary | ICD-10-CM | POA: Diagnosis not present

## 2021-12-04 DIAGNOSIS — E1122 Type 2 diabetes mellitus with diabetic chronic kidney disease: Secondary | ICD-10-CM | POA: Diagnosis not present

## 2021-12-04 DIAGNOSIS — M170 Bilateral primary osteoarthritis of knee: Secondary | ICD-10-CM | POA: Diagnosis not present

## 2021-12-04 DIAGNOSIS — M1909 Primary osteoarthritis, other specified site: Secondary | ICD-10-CM | POA: Diagnosis not present

## 2021-12-04 DIAGNOSIS — G9340 Encephalopathy, unspecified: Secondary | ICD-10-CM | POA: Diagnosis not present

## 2021-12-04 DIAGNOSIS — I129 Hypertensive chronic kidney disease with stage 1 through stage 4 chronic kidney disease, or unspecified chronic kidney disease: Secondary | ICD-10-CM | POA: Diagnosis not present

## 2021-12-04 DIAGNOSIS — N183 Chronic kidney disease, stage 3 unspecified: Secondary | ICD-10-CM | POA: Diagnosis not present

## 2021-12-05 ENCOUNTER — Other Ambulatory Visit: Payer: Self-pay | Admitting: Family Medicine

## 2021-12-05 DIAGNOSIS — G9340 Encephalopathy, unspecified: Secondary | ICD-10-CM | POA: Diagnosis not present

## 2021-12-05 DIAGNOSIS — I129 Hypertensive chronic kidney disease with stage 1 through stage 4 chronic kidney disease, or unspecified chronic kidney disease: Secondary | ICD-10-CM | POA: Diagnosis not present

## 2021-12-05 DIAGNOSIS — N183 Chronic kidney disease, stage 3 unspecified: Secondary | ICD-10-CM | POA: Diagnosis not present

## 2021-12-05 DIAGNOSIS — Z1231 Encounter for screening mammogram for malignant neoplasm of breast: Secondary | ICD-10-CM

## 2021-12-05 DIAGNOSIS — M170 Bilateral primary osteoarthritis of knee: Secondary | ICD-10-CM | POA: Diagnosis not present

## 2021-12-05 DIAGNOSIS — E1122 Type 2 diabetes mellitus with diabetic chronic kidney disease: Secondary | ICD-10-CM | POA: Diagnosis not present

## 2021-12-05 DIAGNOSIS — M1909 Primary osteoarthritis, other specified site: Secondary | ICD-10-CM | POA: Diagnosis not present

## 2021-12-06 DIAGNOSIS — G9340 Encephalopathy, unspecified: Secondary | ICD-10-CM | POA: Diagnosis not present

## 2021-12-06 DIAGNOSIS — N183 Chronic kidney disease, stage 3 unspecified: Secondary | ICD-10-CM | POA: Diagnosis not present

## 2021-12-06 DIAGNOSIS — M1909 Primary osteoarthritis, other specified site: Secondary | ICD-10-CM | POA: Diagnosis not present

## 2021-12-06 DIAGNOSIS — E1122 Type 2 diabetes mellitus with diabetic chronic kidney disease: Secondary | ICD-10-CM | POA: Diagnosis not present

## 2021-12-06 DIAGNOSIS — I129 Hypertensive chronic kidney disease with stage 1 through stage 4 chronic kidney disease, or unspecified chronic kidney disease: Secondary | ICD-10-CM | POA: Diagnosis not present

## 2021-12-06 DIAGNOSIS — M170 Bilateral primary osteoarthritis of knee: Secondary | ICD-10-CM | POA: Diagnosis not present

## 2021-12-11 DIAGNOSIS — M1909 Primary osteoarthritis, other specified site: Secondary | ICD-10-CM | POA: Diagnosis not present

## 2021-12-11 DIAGNOSIS — M170 Bilateral primary osteoarthritis of knee: Secondary | ICD-10-CM | POA: Diagnosis not present

## 2021-12-11 DIAGNOSIS — N183 Chronic kidney disease, stage 3 unspecified: Secondary | ICD-10-CM | POA: Diagnosis not present

## 2021-12-11 DIAGNOSIS — I129 Hypertensive chronic kidney disease with stage 1 through stage 4 chronic kidney disease, or unspecified chronic kidney disease: Secondary | ICD-10-CM | POA: Diagnosis not present

## 2021-12-11 DIAGNOSIS — G9340 Encephalopathy, unspecified: Secondary | ICD-10-CM | POA: Diagnosis not present

## 2021-12-11 DIAGNOSIS — E1122 Type 2 diabetes mellitus with diabetic chronic kidney disease: Secondary | ICD-10-CM | POA: Diagnosis not present

## 2021-12-18 DIAGNOSIS — G9340 Encephalopathy, unspecified: Secondary | ICD-10-CM | POA: Diagnosis not present

## 2021-12-18 DIAGNOSIS — I129 Hypertensive chronic kidney disease with stage 1 through stage 4 chronic kidney disease, or unspecified chronic kidney disease: Secondary | ICD-10-CM | POA: Diagnosis not present

## 2021-12-18 DIAGNOSIS — M1909 Primary osteoarthritis, other specified site: Secondary | ICD-10-CM | POA: Diagnosis not present

## 2021-12-18 DIAGNOSIS — N183 Chronic kidney disease, stage 3 unspecified: Secondary | ICD-10-CM | POA: Diagnosis not present

## 2021-12-18 DIAGNOSIS — M170 Bilateral primary osteoarthritis of knee: Secondary | ICD-10-CM | POA: Diagnosis not present

## 2021-12-18 DIAGNOSIS — E1122 Type 2 diabetes mellitus with diabetic chronic kidney disease: Secondary | ICD-10-CM | POA: Diagnosis not present

## 2021-12-19 DIAGNOSIS — E1122 Type 2 diabetes mellitus with diabetic chronic kidney disease: Secondary | ICD-10-CM | POA: Diagnosis not present

## 2021-12-19 DIAGNOSIS — R410 Disorientation, unspecified: Secondary | ICD-10-CM | POA: Diagnosis not present

## 2021-12-19 DIAGNOSIS — E7800 Pure hypercholesterolemia, unspecified: Secondary | ICD-10-CM | POA: Diagnosis not present

## 2021-12-19 DIAGNOSIS — N390 Urinary tract infection, site not specified: Secondary | ICD-10-CM | POA: Diagnosis not present

## 2021-12-19 DIAGNOSIS — R399 Unspecified symptoms and signs involving the genitourinary system: Secondary | ICD-10-CM | POA: Diagnosis not present

## 2021-12-19 DIAGNOSIS — Z111 Encounter for screening for respiratory tuberculosis: Secondary | ICD-10-CM | POA: Diagnosis not present

## 2021-12-20 DIAGNOSIS — J309 Allergic rhinitis, unspecified: Secondary | ICD-10-CM | POA: Diagnosis not present

## 2021-12-20 DIAGNOSIS — E559 Vitamin D deficiency, unspecified: Secondary | ICD-10-CM | POA: Diagnosis not present

## 2021-12-20 DIAGNOSIS — I129 Hypertensive chronic kidney disease with stage 1 through stage 4 chronic kidney disease, or unspecified chronic kidney disease: Secondary | ICD-10-CM | POA: Diagnosis not present

## 2021-12-20 DIAGNOSIS — G9340 Encephalopathy, unspecified: Secondary | ICD-10-CM | POA: Diagnosis not present

## 2021-12-20 DIAGNOSIS — D509 Iron deficiency anemia, unspecified: Secondary | ICD-10-CM | POA: Diagnosis not present

## 2021-12-20 DIAGNOSIS — I491 Atrial premature depolarization: Secondary | ICD-10-CM | POA: Diagnosis not present

## 2021-12-20 DIAGNOSIS — N183 Chronic kidney disease, stage 3 unspecified: Secondary | ICD-10-CM | POA: Diagnosis not present

## 2021-12-20 DIAGNOSIS — E7800 Pure hypercholesterolemia, unspecified: Secondary | ICD-10-CM | POA: Diagnosis not present

## 2021-12-20 DIAGNOSIS — H401131 Primary open-angle glaucoma, bilateral, mild stage: Secondary | ICD-10-CM | POA: Diagnosis not present

## 2021-12-20 DIAGNOSIS — Z9181 History of falling: Secondary | ICD-10-CM | POA: Diagnosis not present

## 2021-12-20 DIAGNOSIS — G8929 Other chronic pain: Secondary | ICD-10-CM | POA: Diagnosis not present

## 2021-12-20 DIAGNOSIS — Z85818 Personal history of malignant neoplasm of other sites of lip, oral cavity, and pharynx: Secondary | ICD-10-CM | POA: Diagnosis not present

## 2021-12-20 DIAGNOSIS — M170 Bilateral primary osteoarthritis of knee: Secondary | ICD-10-CM | POA: Diagnosis not present

## 2021-12-20 DIAGNOSIS — F322 Major depressive disorder, single episode, severe without psychotic features: Secondary | ICD-10-CM | POA: Diagnosis not present

## 2021-12-20 DIAGNOSIS — D519 Vitamin B12 deficiency anemia, unspecified: Secondary | ICD-10-CM | POA: Diagnosis not present

## 2021-12-20 DIAGNOSIS — Z7984 Long term (current) use of oral hypoglycemic drugs: Secondary | ICD-10-CM | POA: Diagnosis not present

## 2021-12-20 DIAGNOSIS — E1122 Type 2 diabetes mellitus with diabetic chronic kidney disease: Secondary | ICD-10-CM | POA: Diagnosis not present

## 2021-12-20 DIAGNOSIS — R413 Other amnesia: Secondary | ICD-10-CM | POA: Diagnosis not present

## 2021-12-20 DIAGNOSIS — M5441 Lumbago with sciatica, right side: Secondary | ICD-10-CM | POA: Diagnosis not present

## 2021-12-20 DIAGNOSIS — H9190 Unspecified hearing loss, unspecified ear: Secondary | ICD-10-CM | POA: Diagnosis not present

## 2021-12-20 DIAGNOSIS — N281 Cyst of kidney, acquired: Secondary | ICD-10-CM | POA: Diagnosis not present

## 2021-12-21 DIAGNOSIS — R300 Dysuria: Secondary | ICD-10-CM | POA: Diagnosis not present

## 2021-12-25 DIAGNOSIS — E1122 Type 2 diabetes mellitus with diabetic chronic kidney disease: Secondary | ICD-10-CM | POA: Diagnosis not present

## 2021-12-25 DIAGNOSIS — I129 Hypertensive chronic kidney disease with stage 1 through stage 4 chronic kidney disease, or unspecified chronic kidney disease: Secondary | ICD-10-CM | POA: Diagnosis not present

## 2021-12-25 DIAGNOSIS — M5441 Lumbago with sciatica, right side: Secondary | ICD-10-CM | POA: Diagnosis not present

## 2021-12-25 DIAGNOSIS — M170 Bilateral primary osteoarthritis of knee: Secondary | ICD-10-CM | POA: Diagnosis not present

## 2021-12-25 DIAGNOSIS — G9340 Encephalopathy, unspecified: Secondary | ICD-10-CM | POA: Diagnosis not present

## 2021-12-25 DIAGNOSIS — G8929 Other chronic pain: Secondary | ICD-10-CM | POA: Diagnosis not present

## 2022-01-05 DIAGNOSIS — G9340 Encephalopathy, unspecified: Secondary | ICD-10-CM | POA: Diagnosis not present

## 2022-01-05 DIAGNOSIS — I129 Hypertensive chronic kidney disease with stage 1 through stage 4 chronic kidney disease, or unspecified chronic kidney disease: Secondary | ICD-10-CM | POA: Diagnosis not present

## 2022-01-05 DIAGNOSIS — G8929 Other chronic pain: Secondary | ICD-10-CM | POA: Diagnosis not present

## 2022-01-05 DIAGNOSIS — M170 Bilateral primary osteoarthritis of knee: Secondary | ICD-10-CM | POA: Diagnosis not present

## 2022-01-05 DIAGNOSIS — E1122 Type 2 diabetes mellitus with diabetic chronic kidney disease: Secondary | ICD-10-CM | POA: Diagnosis not present

## 2022-01-05 DIAGNOSIS — M5441 Lumbago with sciatica, right side: Secondary | ICD-10-CM | POA: Diagnosis not present

## 2022-01-08 DIAGNOSIS — R410 Disorientation, unspecified: Secondary | ICD-10-CM | POA: Diagnosis not present

## 2022-01-09 ENCOUNTER — Ambulatory Visit: Payer: Medicare Other

## 2022-01-10 DIAGNOSIS — G8929 Other chronic pain: Secondary | ICD-10-CM | POA: Diagnosis not present

## 2022-01-10 DIAGNOSIS — G9340 Encephalopathy, unspecified: Secondary | ICD-10-CM | POA: Diagnosis not present

## 2022-01-10 DIAGNOSIS — I129 Hypertensive chronic kidney disease with stage 1 through stage 4 chronic kidney disease, or unspecified chronic kidney disease: Secondary | ICD-10-CM | POA: Diagnosis not present

## 2022-01-10 DIAGNOSIS — E1122 Type 2 diabetes mellitus with diabetic chronic kidney disease: Secondary | ICD-10-CM | POA: Diagnosis not present

## 2022-01-10 DIAGNOSIS — M5441 Lumbago with sciatica, right side: Secondary | ICD-10-CM | POA: Diagnosis not present

## 2022-01-10 DIAGNOSIS — M170 Bilateral primary osteoarthritis of knee: Secondary | ICD-10-CM | POA: Diagnosis not present

## 2022-01-11 DIAGNOSIS — H401131 Primary open-angle glaucoma, bilateral, mild stage: Secondary | ICD-10-CM | POA: Diagnosis not present

## 2022-01-16 ENCOUNTER — Ambulatory Visit
Admission: RE | Admit: 2022-01-16 | Discharge: 2022-01-16 | Disposition: A | Discharge status: Home / Self Care | Payer: Medicare Other | Source: Ambulatory Visit | Attending: Family Medicine | Admitting: Family Medicine

## 2022-01-16 DIAGNOSIS — Z1231 Encounter for screening mammogram for malignant neoplasm of breast: Secondary | ICD-10-CM | POA: Diagnosis not present

## 2022-01-19 DIAGNOSIS — Z9181 History of falling: Secondary | ICD-10-CM | POA: Diagnosis not present

## 2022-01-19 DIAGNOSIS — D509 Iron deficiency anemia, unspecified: Secondary | ICD-10-CM | POA: Diagnosis not present

## 2022-01-19 DIAGNOSIS — G8929 Other chronic pain: Secondary | ICD-10-CM | POA: Diagnosis not present

## 2022-01-19 DIAGNOSIS — I129 Hypertensive chronic kidney disease with stage 1 through stage 4 chronic kidney disease, or unspecified chronic kidney disease: Secondary | ICD-10-CM | POA: Diagnosis not present

## 2022-01-19 DIAGNOSIS — I491 Atrial premature depolarization: Secondary | ICD-10-CM | POA: Diagnosis not present

## 2022-01-19 DIAGNOSIS — H9190 Unspecified hearing loss, unspecified ear: Secondary | ICD-10-CM | POA: Diagnosis not present

## 2022-01-19 DIAGNOSIS — J309 Allergic rhinitis, unspecified: Secondary | ICD-10-CM | POA: Diagnosis not present

## 2022-01-19 DIAGNOSIS — Z85818 Personal history of malignant neoplasm of other sites of lip, oral cavity, and pharynx: Secondary | ICD-10-CM | POA: Diagnosis not present

## 2022-01-19 DIAGNOSIS — G9340 Encephalopathy, unspecified: Secondary | ICD-10-CM | POA: Diagnosis not present

## 2022-01-19 DIAGNOSIS — E559 Vitamin D deficiency, unspecified: Secondary | ICD-10-CM | POA: Diagnosis not present

## 2022-01-19 DIAGNOSIS — E1122 Type 2 diabetes mellitus with diabetic chronic kidney disease: Secondary | ICD-10-CM | POA: Diagnosis not present

## 2022-01-19 DIAGNOSIS — D519 Vitamin B12 deficiency anemia, unspecified: Secondary | ICD-10-CM | POA: Diagnosis not present

## 2022-01-19 DIAGNOSIS — R413 Other amnesia: Secondary | ICD-10-CM | POA: Diagnosis not present

## 2022-01-19 DIAGNOSIS — M5441 Lumbago with sciatica, right side: Secondary | ICD-10-CM | POA: Diagnosis not present

## 2022-01-19 DIAGNOSIS — M170 Bilateral primary osteoarthritis of knee: Secondary | ICD-10-CM | POA: Diagnosis not present

## 2022-01-19 DIAGNOSIS — N183 Chronic kidney disease, stage 3 unspecified: Secondary | ICD-10-CM | POA: Diagnosis not present

## 2022-01-19 DIAGNOSIS — N281 Cyst of kidney, acquired: Secondary | ICD-10-CM | POA: Diagnosis not present

## 2022-01-19 DIAGNOSIS — Z7984 Long term (current) use of oral hypoglycemic drugs: Secondary | ICD-10-CM | POA: Diagnosis not present

## 2022-01-19 DIAGNOSIS — H401131 Primary open-angle glaucoma, bilateral, mild stage: Secondary | ICD-10-CM | POA: Diagnosis not present

## 2022-01-19 DIAGNOSIS — E7800 Pure hypercholesterolemia, unspecified: Secondary | ICD-10-CM | POA: Diagnosis not present

## 2022-01-19 DIAGNOSIS — F322 Major depressive disorder, single episode, severe without psychotic features: Secondary | ICD-10-CM | POA: Diagnosis not present

## 2022-01-22 DIAGNOSIS — G8929 Other chronic pain: Secondary | ICD-10-CM | POA: Diagnosis not present

## 2022-01-22 DIAGNOSIS — M170 Bilateral primary osteoarthritis of knee: Secondary | ICD-10-CM | POA: Diagnosis not present

## 2022-01-22 DIAGNOSIS — M5441 Lumbago with sciatica, right side: Secondary | ICD-10-CM | POA: Diagnosis not present

## 2022-01-22 DIAGNOSIS — I129 Hypertensive chronic kidney disease with stage 1 through stage 4 chronic kidney disease, or unspecified chronic kidney disease: Secondary | ICD-10-CM | POA: Diagnosis not present

## 2022-01-22 DIAGNOSIS — E1122 Type 2 diabetes mellitus with diabetic chronic kidney disease: Secondary | ICD-10-CM | POA: Diagnosis not present

## 2022-01-22 DIAGNOSIS — G9340 Encephalopathy, unspecified: Secondary | ICD-10-CM | POA: Diagnosis not present

## 2022-01-25 DIAGNOSIS — R410 Disorientation, unspecified: Secondary | ICD-10-CM | POA: Diagnosis not present

## 2022-02-01 DIAGNOSIS — L821 Other seborrheic keratosis: Secondary | ICD-10-CM | POA: Diagnosis not present

## 2022-02-01 DIAGNOSIS — L820 Inflamed seborrheic keratosis: Secondary | ICD-10-CM | POA: Diagnosis not present

## 2022-02-01 DIAGNOSIS — L814 Other melanin hyperpigmentation: Secondary | ICD-10-CM | POA: Diagnosis not present

## 2022-02-01 DIAGNOSIS — L720 Epidermal cyst: Secondary | ICD-10-CM | POA: Diagnosis not present

## 2022-02-05 DIAGNOSIS — M5441 Lumbago with sciatica, right side: Secondary | ICD-10-CM | POA: Diagnosis not present

## 2022-02-05 DIAGNOSIS — I129 Hypertensive chronic kidney disease with stage 1 through stage 4 chronic kidney disease, or unspecified chronic kidney disease: Secondary | ICD-10-CM | POA: Diagnosis not present

## 2022-02-05 DIAGNOSIS — M170 Bilateral primary osteoarthritis of knee: Secondary | ICD-10-CM | POA: Diagnosis not present

## 2022-02-05 DIAGNOSIS — E1122 Type 2 diabetes mellitus with diabetic chronic kidney disease: Secondary | ICD-10-CM | POA: Diagnosis not present

## 2022-02-05 DIAGNOSIS — G8929 Other chronic pain: Secondary | ICD-10-CM | POA: Diagnosis not present

## 2022-02-05 DIAGNOSIS — G9340 Encephalopathy, unspecified: Secondary | ICD-10-CM | POA: Diagnosis not present

## 2022-02-06 DIAGNOSIS — D519 Vitamin B12 deficiency anemia, unspecified: Secondary | ICD-10-CM | POA: Diagnosis not present

## 2022-02-06 DIAGNOSIS — R410 Disorientation, unspecified: Secondary | ICD-10-CM | POA: Diagnosis not present

## 2022-02-21 ENCOUNTER — Encounter: Payer: Self-pay | Admitting: Internal Medicine

## 2022-02-22 DIAGNOSIS — R413 Other amnesia: Secondary | ICD-10-CM | POA: Diagnosis not present

## 2022-02-22 DIAGNOSIS — R410 Disorientation, unspecified: Secondary | ICD-10-CM | POA: Diagnosis not present

## 2022-02-22 DIAGNOSIS — I6782 Cerebral ischemia: Secondary | ICD-10-CM | POA: Diagnosis not present

## 2022-03-05 DIAGNOSIS — H401131 Primary open-angle glaucoma, bilateral, mild stage: Secondary | ICD-10-CM | POA: Diagnosis not present

## 2022-03-06 ENCOUNTER — Inpatient Hospital Stay: Payer: Medicare Other | Attending: Internal Medicine

## 2022-03-06 ENCOUNTER — Ambulatory Visit (HOSPITAL_COMMUNITY)
Admission: RE | Admit: 2022-03-06 | Discharge: 2022-03-06 | Disposition: A | Discharge status: Home / Self Care | Payer: Medicare Other | Source: Ambulatory Visit | Attending: Internal Medicine | Admitting: Internal Medicine

## 2022-03-06 ENCOUNTER — Other Ambulatory Visit: Payer: Self-pay

## 2022-03-06 DIAGNOSIS — C7A090 Malignant carcinoid tumor of the bronchus and lung: Secondary | ICD-10-CM | POA: Insufficient documentation

## 2022-03-06 DIAGNOSIS — R0602 Shortness of breath: Secondary | ICD-10-CM | POA: Diagnosis not present

## 2022-03-06 DIAGNOSIS — R059 Cough, unspecified: Secondary | ICD-10-CM | POA: Diagnosis not present

## 2022-03-06 DIAGNOSIS — R918 Other nonspecific abnormal finding of lung field: Secondary | ICD-10-CM | POA: Diagnosis not present

## 2022-03-06 LAB — CMP (CANCER CENTER ONLY)
ALT: 21 U/L (ref 0–44)
AST: 18 U/L (ref 15–41)
Albumin: 4.2 g/dL (ref 3.5–5.0)
Alkaline Phosphatase: 80 U/L (ref 38–126)
Anion gap: 7 (ref 5–15)
BUN: 18 mg/dL (ref 8–23)
CO2: 27 mmol/L (ref 22–32)
Calcium: 9.7 mg/dL (ref 8.9–10.3)
Chloride: 106 mmol/L (ref 98–111)
Creatinine: 1 mg/dL (ref 0.44–1.00)
GFR, Estimated: 59 mL/min — ABNORMAL LOW (ref >60)
Glucose, Bld: 165 mg/dL — ABNORMAL HIGH (ref 70–99)
Potassium: 4.3 mmol/L (ref 3.5–5.1)
Sodium: 140 mmol/L (ref 135–145)
Total Bilirubin: 0.6 mg/dL (ref 0.3–1.2)
Total Protein: 7 g/dL (ref 6.5–8.1)

## 2022-03-06 LAB — CBC WITH DIFFERENTIAL (CANCER CENTER ONLY)
Abs Immature Granulocytes: 0.01 10*3/uL (ref 0.00–0.07)
Basophils Absolute: 0.1 10*3/uL (ref 0.0–0.1)
Basophils Relative: 1 %
Eosinophils Absolute: 0.2 10*3/uL (ref 0.0–0.5)
Eosinophils Relative: 3 %
HCT: 38.8 % (ref 36.0–46.0)
Hemoglobin: 13.1 g/dL (ref 12.0–15.0)
Immature Granulocytes: 0 %
Lymphocytes Relative: 27 %
Lymphs Abs: 1.9 10*3/uL (ref 0.7–4.0)
MCH: 32.6 pg (ref 26.0–34.0)
MCHC: 33.8 g/dL (ref 30.0–36.0)
MCV: 96.5 fL (ref 80.0–100.0)
Monocytes Absolute: 0.6 10*3/uL (ref 0.1–1.0)
Monocytes Relative: 8 %
Neutro Abs: 4.3 10*3/uL (ref 1.7–7.7)
Neutrophils Relative %: 61 %
Platelet Count: 299 10*3/uL (ref 150–400)
RBC: 4.02 MIL/uL (ref 3.87–5.11)
RDW: 13.9 % (ref 11.5–15.5)
WBC Count: 7.1 10*3/uL (ref 4.0–10.5)
nRBC: 0 % (ref 0.0–0.2)

## 2022-03-06 MED ORDER — IOHEXOL 300 MG/ML  SOLN
100.0000 mL | Freq: Once | INTRAMUSCULAR | Status: AC | PRN
  Administered 2022-03-06: 100 mL via INTRAVENOUS

## 2022-03-06 MED ORDER — SODIUM CHLORIDE (PF) 0.9 % IJ SOLN
INTRAMUSCULAR | Status: AC
  Filled 2022-03-06: qty 50

## 2022-03-07 LAB — CHROMOGRANIN A: Chromogranin A (ng/mL): 254.6 ng/mL — ABNORMAL HIGH (ref 0.0–101.8)

## 2022-03-08 ENCOUNTER — Inpatient Hospital Stay (HOSPITAL_BASED_OUTPATIENT_CLINIC_OR_DEPARTMENT_OTHER): Payer: Medicare Other | Admitting: Internal Medicine

## 2022-03-08 VITALS — BP 157/80 | HR 65 | Temp 98.2°F | Resp 16 | Wt 172.4 lb

## 2022-03-08 DIAGNOSIS — C7A090 Malignant carcinoid tumor of the bronchus and lung: Secondary | ICD-10-CM | POA: Diagnosis not present

## 2022-03-08 DIAGNOSIS — C3492 Malignant neoplasm of unspecified part of left bronchus or lung: Secondary | ICD-10-CM

## 2022-03-08 DIAGNOSIS — R059 Cough, unspecified: Secondary | ICD-10-CM | POA: Diagnosis not present

## 2022-03-08 DIAGNOSIS — R0602 Shortness of breath: Secondary | ICD-10-CM | POA: Diagnosis not present

## 2022-03-08 NOTE — Progress Notes (Signed)
[place] [phone]   [phone]  OFFICE PROGRESS NOTE  Michaela Smoker, MD [place] [address]  DIAGNOSIS: Stage IV (T1b, N0, M1a) low-grade neuroendocrine carcinoma (carcinoid tumor), presented with innumerable bilateral pulmonary nodules diagnosed in March 2023.  PRIOR THERAPY: None  CURRENT THERAPY: Observation  INTERVAL HISTORY: Michaela Santana 76 y.o. female returns to the clinic today for follow-up visit accompanied by her daughter.  The patient is feeling fine today with no concerning complaints except for the baseline shortness of breath and mild cough.  She has no significant change in her condition since the last visit except for some memory issues.  She is followed by neurology.  She denied having any chest pain or hemoptysis.  She has no nausea, vomiting, diarrhea or constipation.  She has no headache or visual changes.  She denied having any recent weight loss or night sweats.  She had repeat CT scan of the chest performed recently and she is here for evaluation and discussion of her scan results.  MEDICAL HISTORY: Past Medical History:  Diagnosis Date   Anemia    Arthritis    "probably in hands" (07/17/2016)   CKD (chronic kidney disease), stage III (HCC)    Depression    Family history of adverse reaction to anesthesia    "her father had some issues when he had his gallbladder taken out when he was in his late 48s"   GERD (gastroesophageal reflux disease)    Glaucoma    open angle, bilateral   High cholesterol    History of kidney stones    Hypertension    Lumbago with sciatica, right side    Sepsis ([facility]) 07/2016   d/t flu   Type II diabetes mellitus ([facility])    Vitamin D deficiency     ALLERGIES:  is allergic to codeine and alprazolam.  MEDICATIONS:  Current Outpatient Medications  Medication Sig Dispense Refill   acetaminophen (TYLENOL) 650 MG CR tablet Take 650 mg by mouth every 8  (eight) hours as needed for pain.     amLODipine (NORVASC) 5 MG tablet Take 5 mg by mouth daily.     atorvastatin (LIPITOR) 10 MG tablet Take 10 mg by mouth daily.     Blood Glucose Monitoring Suppl (FREESTYLE LITE) DEVI USE UTD  0   DULoxetine HCl 40 MG CPEP Take 40 mg by mouth daily.     famotidine (PEPCID) 20 MG tablet Take 20 mg by mouth daily.     ferrous sulfate 325 (65 FE) MG EC tablet Take 325 mg by mouth daily.     FREESTYLE LITE test strip USE TO CHECK FASTING GLUCOSE IN THE MORNING AND 2 HOURS AFTER DINNER OR LUNCH  3   gabapentin (NEURONTIN) 300 MG capsule Take 300 mg by mouth every evening.     glipiZIDE (GLUCOTROL XL) 5 MG 24 hr tablet Take 5 mg by mouth daily with breakfast.     Lancets (FREESTYLE) lancets      loratadine (CLARITIN) 10 MG tablet Take 10 mg by mouth daily as needed for allergies.     losartan (COZAAR) 100 MG tablet Take 100 mg by mouth daily.     magnesium oxide (MAG-OX) 400 (240 Mg) MG tablet Take 800 mg by mouth 2 (two) times daily.     metFORMIN (GLUCOPHAGE-XR) 500 MG 24 hr tablet Take 500-1,000 mg by mouth See admin instructions. Take 1 tablet ( 500 mg) in  the morning and 2 tablets (1000 mg) in the evening     ondansetron (ZOFRAN) 8 MG tablet Take 8 mg by mouth every 8 (eight) hours as needed for nausea/vomiting.     Propylene Glycol (SYSTANE BALANCE) 0.6 % SOLN Place 1 drop into both eyes daily as needed (dry eyes).     vitamin B-12 (CYANOCOBALAMIN) 1000 MCG tablet Take 1 tablet (1,000 mcg total) by mouth daily. 30 tablet 0   Vitamin D, Ergocalciferol, (DRISDOL) 50000 units CAPS capsule Take 50,000 Units by mouth every Friday.     No current facility-administered medications for this visit.    SURGICAL HISTORY:  Past Surgical History:  Procedure Laterality Date   BREAST BIOPSY  1990   "? side; it wasn't anything"   BRONCHIAL BIOPSY  08/01/2021   Procedure: BRONCHIAL BIOPSIES;  Surgeon: Garner Nash, DO;  Location: [facility];  Service: Pulmonary;;    BRONCHIAL NEEDLE ASPIRATION BIOPSY  08/01/2021   Procedure: BRONCHIAL NEEDLE ASPIRATION BIOPSIES;  Surgeon: Garner Nash, DO;  Location: [facility];  Service: Pulmonary;;   CATARACT EXTRACTION, BILATERAL  2021   CHOLECYSTECTOMY OPEN  1971   TUBAL LIGATION  1978   VIDEO BRONCHOSCOPY WITH RADIAL ENDOBRONCHIAL ULTRASOUND  08/01/2021   Procedure: RADIAL ENDOBRONCHIAL ULTRASOUND;  Surgeon: Garner Nash, DO;  Location: [facility];  Service: Pulmonary;;    REVIEW OF SYSTEMS:  A comprehensive review of systems was negative except for: Constitutional: positive for fatigue Respiratory: positive for cough and dyspnea on exertion Neurological: positive for memory problems   PHYSICAL EXAMINATION: General appearance: alert, cooperative, fatigued, and no distress Head: Normocephalic, without obvious abnormality, atraumatic Neck: no adenopathy, no JVD, supple, symmetrical, trachea midline, and thyroid not enlarged, symmetric, no tenderness/mass/nodules Lymph nodes: Cervical, supraclavicular, and axillary nodes normal. Resp: clear to auscultation bilaterally Back: symmetric, no curvature. ROM normal. No CVA tenderness. Cardio: regular rate and rhythm, S1, S2 normal, no murmur, click, rub or gallop GI: soft, non-tender; bowel sounds normal; no masses,  no organomegaly Extremities: extremities normal, atraumatic, no cyanosis or edema  ECOG PERFORMANCE STATUS: 1 - Symptomatic but completely ambulatory  Blood pressure (!) 157/80, pulse 65, temperature 98.2 F (36.8 C), temperature source Oral, resp. rate 16, weight 172 lb 6.4 oz (78.2 kg), SpO2 97 %.  LABORATORY DATA: Lab Results  Component Value Date   WBC 7.1 03/06/2022   HGB 13.1 03/06/2022   HCT 38.8 03/06/2022   MCV 96.5 03/06/2022   PLT 299 03/06/2022      Chemistry      Component Value Date/Time   NA 140 03/06/2022 1213   K 4.3 03/06/2022 1213   CL 106 03/06/2022 1213   CO2 27 03/06/2022 1213   BUN 18 03/06/2022 1213    CREATININE 1.00 03/06/2022 1213      Component Value Date/Time   CALCIUM 9.7 03/06/2022 1213   ALKPHOS 80 03/06/2022 1213   AST 18 03/06/2022 1213   ALT 21 03/06/2022 1213   BILITOT 0.6 03/06/2022 1213       RADIOGRAPHIC STUDIES: CT Chest W Contrast  Result Date: 03/07/2022 CLINICAL DATA:  Narrowing to continue Percell Miller follow-up in a 76 year old female. * Tracking Code: BO * EXAM: CT CHEST WITH CONTRAST TECHNIQUE: Multidetector CT imaging of the chest was performed during intravenous contrast administration. RADIATION DOSE REDUCTION: This exam was performed according to the departmental dose-optimization program which includes automated exposure control, adjustment of the mA and/or kV according to patient size and/or use of iterative reconstruction technique. CONTRAST:  134mL OMNIPAQUE IOHEXOL 300 MG/ML  SOLN COMPARISON:  Oct 26, 2021 FINDINGS: Cardiovascular: Mild calcified aortic atherosclerosis. Mild cardiac enlargement. No pericardial effusion or nodularity. Central pulmonary vasculature is unremarkable to the extent evaluated on venous phase assessment. Mediastinum/Nodes: Moderately large hiatal hernia. Esophagus otherwise unremarkable. No signs of adenopathy in the chest. Lungs/Pleura: Diffuse pulmonary nodules randomly distributed throughout the chest, largest in the LEFT upper lobe measuring 12 mm is stable. (Image 59/5) Largest in the LEFT lower lobe measuring 11 mm is stable. (Image 60/5) Largest in the RIGHT middle lobe measuring 7 mm is stable. (Image 63/7) Multiplicity would make detection of new nodules difficult. Airways are patent. There is no lobar consolidation or sign of pleural effusion. Upper Abdomen: Incidental imaging of upper abdominal contents shows no acute findings. Imaged portions the liver, pancreas, spleen, adrenal glands and kidneys are unremarkable. Musculoskeletal: No acute bone finding. No destructive bone process. Spinal degenerative changes. IMPRESSION: 1. Diffuse  pulmonary nodules appear stable since previous imaging. In the absence of known primary process such as DIPNECH is considered. This process can be associated with mosaic attenuation which is also present on today's study to a lesser extent than on prior exams. 2. Moderately large hiatal hernia. 3. Mild cardiac enlargement. 4. Aortic atherosclerosis. Aortic Atherosclerosis (ICD10-I70.0). Electronically Signed   By: Zetta Bills M.D.   On: 03/07/2022 16:56    ASSESSMENT AND PLAN: This is a very pleasant 76 years old white female recently diagnosed with a stage IV (T1b, N0, M1 a) low-grade neuroendocrine carcinoma (carcinoid tumor) presented with innumerable bilateral pulmonary nodules diagnosed in March 2023. The patient had MRI of the brain performed recently.  It showed no evidence for metastatic disease to the brain. The recent CT-guided core biopsy of the left lung nodule was consistent with low-grade neuroendocrine carcinoma. I explained to the patient that the NCCN guideline would recommend continuous observation and monitoring for patient with bilateral pulmonary nodules consistent with a carcinoid tumor unless the patient is symptomatic then we would consider her for treatment with octreotide LAR to control her symptoms. The patient has been doing fine with no concerning complaints since her last visit. She had repeat CT scan of the chest performed recently.  I personally and independently reviewed the scan images and discussed the result with the patient and her daughter.  Her scan showed no concerning findings for disease progression. I recommended for the patient to continue on observation with repeat CT scan of the chest in 6 months. If the patient develop any more symptoms especially worsening cough, wheezing or shortness of breath, I will consider her for treatment with octreotide LAR 20 mg intramuscular on monthly basis. She was advised to call immediately if she has any other concerning  symptoms in the interval. The patient voices understanding of current disease status and treatment options and is in agreement with the current care plan.  All questions were answered. The patient knows to call the clinic with any problems, questions or concerns. We can certainly see the patient much sooner if necessary.  The total time spent in the appointment was 20 minutes.  Disclaimer: This note was dictated with voice recognition software. Similar sounding words can inadvertently be transcribed and may not be corrected upon review.

## 2022-03-15 DIAGNOSIS — M5136 Other intervertebral disc degeneration, lumbar region: Secondary | ICD-10-CM | POA: Diagnosis not present

## 2022-03-15 DIAGNOSIS — M47816 Spondylosis without myelopathy or radiculopathy, lumbar region: Secondary | ICD-10-CM | POA: Diagnosis not present

## 2022-03-26 DIAGNOSIS — M47816 Spondylosis without myelopathy or radiculopathy, lumbar region: Secondary | ICD-10-CM | POA: Diagnosis not present

## 2022-03-30 DIAGNOSIS — M47816 Spondylosis without myelopathy or radiculopathy, lumbar region: Secondary | ICD-10-CM | POA: Diagnosis not present

## 2022-04-04 DIAGNOSIS — M47816 Spondylosis without myelopathy or radiculopathy, lumbar region: Secondary | ICD-10-CM | POA: Diagnosis not present

## 2022-04-06 DIAGNOSIS — M47816 Spondylosis without myelopathy or radiculopathy, lumbar region: Secondary | ICD-10-CM | POA: Diagnosis not present

## 2022-04-09 DIAGNOSIS — M47816 Spondylosis without myelopathy or radiculopathy, lumbar region: Secondary | ICD-10-CM | POA: Diagnosis not present

## 2022-04-11 DIAGNOSIS — M47816 Spondylosis without myelopathy or radiculopathy, lumbar region: Secondary | ICD-10-CM | POA: Diagnosis not present

## 2022-04-13 DIAGNOSIS — M47816 Spondylosis without myelopathy or radiculopathy, lumbar region: Secondary | ICD-10-CM | POA: Diagnosis not present

## 2022-04-16 DIAGNOSIS — R197 Diarrhea, unspecified: Secondary | ICD-10-CM | POA: Diagnosis not present

## 2022-04-16 DIAGNOSIS — R112 Nausea with vomiting, unspecified: Secondary | ICD-10-CM | POA: Diagnosis not present

## 2022-04-16 DIAGNOSIS — R109 Unspecified abdominal pain: Secondary | ICD-10-CM | POA: Diagnosis not present

## 2022-04-16 DIAGNOSIS — J0101 Acute recurrent maxillary sinusitis: Secondary | ICD-10-CM | POA: Diagnosis not present

## 2022-04-17 ENCOUNTER — Encounter (HOSPITAL_COMMUNITY): Payer: Self-pay | Admitting: Emergency Medicine

## 2022-04-17 ENCOUNTER — Emergency Department (HOSPITAL_COMMUNITY)
Admission: EM | Admit: 2022-04-17 | Discharge: 2022-04-17 | Disposition: A | Discharge status: Home / Self Care | Payer: Medicare Other | Attending: Emergency Medicine | Admitting: Emergency Medicine

## 2022-04-17 ENCOUNTER — Emergency Department (HOSPITAL_COMMUNITY): Payer: Medicare Other

## 2022-04-17 ENCOUNTER — Other Ambulatory Visit: Payer: Self-pay

## 2022-04-17 DIAGNOSIS — R112 Nausea with vomiting, unspecified: Secondary | ICD-10-CM | POA: Diagnosis not present

## 2022-04-17 DIAGNOSIS — J9801 Acute bronchospasm: Secondary | ICD-10-CM | POA: Insufficient documentation

## 2022-04-17 DIAGNOSIS — R531 Weakness: Secondary | ICD-10-CM | POA: Diagnosis not present

## 2022-04-17 DIAGNOSIS — Z20822 Contact with and (suspected) exposure to covid-19: Secondary | ICD-10-CM | POA: Insufficient documentation

## 2022-04-17 DIAGNOSIS — R4182 Altered mental status, unspecified: Secondary | ICD-10-CM | POA: Diagnosis not present

## 2022-04-17 DIAGNOSIS — R410 Disorientation, unspecified: Secondary | ICD-10-CM | POA: Insufficient documentation

## 2022-04-17 DIAGNOSIS — R0602 Shortness of breath: Secondary | ICD-10-CM | POA: Diagnosis not present

## 2022-04-17 DIAGNOSIS — R059 Cough, unspecified: Secondary | ICD-10-CM | POA: Diagnosis not present

## 2022-04-17 LAB — CBC WITH DIFFERENTIAL/PLATELET
Abs Immature Granulocytes: 0.04 10*3/uL (ref 0.00–0.07)
Basophils Absolute: 0.1 10*3/uL (ref 0.0–0.1)
Basophils Relative: 1 %
Eosinophils Absolute: 0.3 10*3/uL (ref 0.0–0.5)
Eosinophils Relative: 3 %
HCT: 41 % (ref 36.0–46.0)
Hemoglobin: 13.9 g/dL (ref 12.0–15.0)
Immature Granulocytes: 0 %
Lymphocytes Relative: 13 %
Lymphs Abs: 1.5 10*3/uL (ref 0.7–4.0)
MCH: 33 pg (ref 26.0–34.0)
MCHC: 33.9 g/dL (ref 30.0–36.0)
MCV: 97.4 fL (ref 80.0–100.0)
Monocytes Absolute: 1 10*3/uL (ref 0.1–1.0)
Monocytes Relative: 9 %
Neutro Abs: 8.7 10*3/uL — ABNORMAL HIGH (ref 1.7–7.7)
Neutrophils Relative %: 74 %
Platelets: 277 10*3/uL (ref 150–400)
RBC: 4.21 MIL/uL (ref 3.87–5.11)
RDW: 13.2 % (ref 11.5–15.5)
WBC: 11.6 10*3/uL — ABNORMAL HIGH (ref 4.0–10.5)
nRBC: 0 % (ref 0.0–0.2)

## 2022-04-17 LAB — BASIC METABOLIC PANEL
Anion gap: 16 — ABNORMAL HIGH (ref 5–15)
BUN: 16 mg/dL (ref 8–23)
CO2: 22 mmol/L (ref 22–32)
Calcium: 10 mg/dL (ref 8.9–10.3)
Chloride: 99 mmol/L (ref 98–111)
Creatinine, Ser: 1.17 mg/dL — ABNORMAL HIGH (ref 0.44–1.00)
GFR, Estimated: 49 mL/min — ABNORMAL LOW (ref >60)
Glucose, Bld: 200 mg/dL — ABNORMAL HIGH (ref 70–99)
Potassium: 4 mmol/L (ref 3.5–5.1)
Sodium: 137 mmol/L (ref 135–145)

## 2022-04-17 LAB — HEPATIC FUNCTION PANEL
ALT: 23 U/L (ref 0–44)
AST: 21 U/L (ref 15–41)
Albumin: 3.8 g/dL (ref 3.5–5.0)
Alkaline Phosphatase: 81 U/L (ref 38–126)
Bilirubin, Direct: 0.1 mg/dL (ref 0.0–0.2)
Indirect Bilirubin: 1 mg/dL — ABNORMAL HIGH (ref 0.3–0.9)
Total Bilirubin: 1.1 mg/dL (ref 0.3–1.2)
Total Protein: 7.1 g/dL (ref 6.5–8.1)

## 2022-04-17 LAB — RESP PANEL BY RT-PCR (FLU A&B, COVID) ARPGX2
Influenza A by PCR: NEGATIVE
Influenza B by PCR: NEGATIVE
SARS Coronavirus 2 by RT PCR: NEGATIVE

## 2022-04-17 LAB — LACTIC ACID, PLASMA
Lactic Acid, Venous: 2.2 mmol/L (ref 0.5–1.9)
Lactic Acid, Venous: 1.8 mmol/L (ref 0.5–1.9)

## 2022-04-17 LAB — TROPONIN I (HIGH SENSITIVITY)
Troponin I (High Sensitivity): 25 ng/L — ABNORMAL HIGH (ref <18)
Troponin I (High Sensitivity): 27 ng/L — ABNORMAL HIGH (ref <18)

## 2022-04-17 LAB — LIPASE, BLOOD: Lipase: 37 U/L (ref 11–51)

## 2022-04-17 MED ORDER — IPRATROPIUM-ALBUTEROL 0.5-2.5 (3) MG/3ML IN SOLN
3.0000 mL | Freq: Once | RESPIRATORY_TRACT | Status: AC
  Administered 2022-04-17: 3 mL via RESPIRATORY_TRACT
  Filled 2022-04-17: qty 3

## 2022-04-17 MED ORDER — ALBUTEROL SULFATE (2.5 MG/3ML) 0.083% IN NEBU: 2.5000 mg | INHALATION_SOLUTION | Freq: Once | RESPIRATORY_TRACT | Status: DC

## 2022-04-17 MED ORDER — IOHEXOL 350 MG/ML SOLN
50.0000 mL | Freq: Once | INTRAVENOUS | Status: AC | PRN
  Administered 2022-04-17: 50 mL via INTRAVENOUS

## 2022-04-17 NOTE — ED Provider Triage Note (Signed)
Emergency Medicine Provider Triage Evaluation Note  Michaela Santana , a 76 y.o. female  was evaluated in triage.  Information provided by daughter. States symptoms began this evening. States she has been slightly more short of breath this evening and noticed her wheezing. States she acts like she is not feeling well, not eating dinner and has been nauseated. States she also noted she was weak and disoriented. States she sat on the toilet and was not able to get back up. Has carcinoid lung tumor and currently not undergoing treatment. .  Review of Systems  Positive: See above Negative:   Physical Exam  BP (!) 124/92 (BP Location: Left Arm)   Pulse 91   Temp 98.7 F (37.1 C) (Oral)   Resp 20   SpO2 95%  Gen:   Awake, no distress   Resp:   MSK:   Moves extremities without difficulty  Other:  Tachypnea with shallow breaths, audible wheezing in room. No stridor. Not hypoxic on room air. She is tired appearing. Abdomen is soft.   Medical Decision Making  Medically screening exam initiated at 1:54 AM.  Appropriate orders placed.  KOREENA JOOST was informed that the remainder of the evaluation will be completed by another provider, this initial triage assessment does not replace that evaluation, and the importance of remaining in the ED until their evaluation is complete.     Mickie Hillier, PA-C 04/17/22 0157

## 2022-04-17 NOTE — ED Notes (Signed)
Pt coming to ED from home. Daughter of pt states patient had a few episodes of vomiting and nausea at the doctor's office yesterday. Pt has also been dealing with a cough for the last few days. Daughter states pt showed some confusion and was unable to follow instructions on how to use her inhaler. Pt currently A/Ox3 and able to follow commands.

## 2022-04-17 NOTE — ED Provider Notes (Signed)
Patient signed out to me awaiting CT scan of chest.  Viral sounding symptoms for the last few days.  Was started on inhaler and antibiotics today by primary care doctor but came for further work-up.  Patient feeling better after breathing treatments.  Lactic acid has improved to 1.8.  Blood work is overall unremarkable.  Troponin stable x2.  PE scan per radiology report shows no blood clot.  No obvious pneumonia.  Overall think that she is safe for discharge.  She has normal vitals.  Blood cultures have been drawn.  She is already on antibiotics and has inhaler.  She has Zofran as well.  She does not have any abdominal pain.  Suspect this is a viral process.  Discharged in good condition.  Understands return precautions.  This chart was dictated using voice recognition software.  Despite best efforts to proofread,  errors can occur which can change the documentation meaning.    Lennice Sites, DO 04/17/22 [phone]

## 2022-04-17 NOTE — ED Provider Notes (Signed)
[facility] EMERGENCY DEPARTMENT Provider Note   CSN: [id number] Arrival date & time: 04/17/22  0123     History  Chief Complaint  Patient presents with   Emesis   Nausea    Michaela Santana is a 76 y.o. female.  Patient presents to the emergency department from home.  Patient has reportedly been sick for the last several days.  Patient with nasal congestion and cough.  Went to her doctor today for this and while there had onset of nausea and vomiting.  Has vomited multiple times.  Tonight daughter noticed that she was confused.  No documented fever.  Has been having increased wheezing, was prescribed an inhaler by her primary doctor earlier today for this.       Home Medications Prior to Admission medications   Medication Sig Start Date End Date Taking? Authorizing Provider  acetaminophen (TYLENOL) 650 MG CR tablet Take 650 mg by mouth every 8 (eight) hours as needed for pain.    [provider]  amLODipine (NORVASC) 5 MG tablet Take 5 mg by mouth daily. 01/20/20   [provider]  atorvastatin (LIPITOR) 10 MG tablet Take 10 mg by mouth daily.    [provider]  Blood Glucose Monitoring Suppl (FREESTYLE LITE) DEVI USE UTD 07/26/16   [provider]  DULoxetine HCl 40 MG CPEP Take 40 mg by mouth daily. 06/29/21   [provider]  famotidine (PEPCID) 20 MG tablet Take 20 mg by mouth daily.    [provider]  ferrous sulfate 325 (65 FE) MG EC tablet Take 325 mg by mouth daily.    [provider]  FREESTYLE LITE test strip USE TO CHECK FASTING GLUCOSE IN THE MORNING AND 2 HOURS AFTER DINNER OR LUNCH 07/26/16   [provider]  gabapentin (NEURONTIN) 300 MG capsule Take 300 mg by mouth every evening. 01/01/20   [provider]  glipiZIDE (GLUCOTROL XL) 5 MG 24 hr tablet Take 5 mg by mouth daily with breakfast. 08/13/16   [provider]  Lancets (FREESTYLE) lancets  07/26/16    [provider]  loratadine (CLARITIN) 10 MG tablet Take 10 mg by mouth daily as needed for allergies.    [provider]  losartan (COZAAR) 100 MG tablet Take 100 mg by mouth daily. 06/21/21   [provider]  magnesium oxide (MAG-OX) 400 (240 Mg) MG tablet Take 800 mg by mouth 2 (two) times daily. 06/23/21   [provider]  metFORMIN (GLUCOPHAGE-XR) 500 MG 24 hr tablet Take 500-1,000 mg by mouth See admin instructions. Take 1 tablet ( 500 mg) in the morning and 2 tablets (1000 mg) in the evening    [provider]  ondansetron (ZOFRAN) 8 MG tablet Take 8 mg by mouth every 8 (eight) hours as needed for nausea/vomiting. 09/14/21   [provider]  Propylene Glycol (SYSTANE BALANCE) 0.6 % SOLN Place 1 drop into both eyes daily as needed (dry eyes).    [provider]  vitamin B-12 (CYANOCOBALAMIN) 1000 MCG tablet Take 1 tablet (1,000 mcg total) by mouth daily. 10/29/21   Damita Lack, MD  Vitamin D, Ergocalciferol, (DRISDOL) 50000 units CAPS capsule Take 50,000 Units by mouth every Friday.    [provider]      Allergies    Codeine and Alprazolam    Review of Systems   Review of Systems  Physical Exam Updated Vital Signs BP 135/74   Pulse 67  Temp 98.7 F (37.1 C) (Oral)   Resp 20   SpO2 97%  Physical Exam Vitals and nursing note reviewed.  Constitutional:      General: She is not in acute distress.    Appearance: She is well-developed.  HENT:     Head: Normocephalic and atraumatic.     Mouth/Throat:     Mouth: Mucous membranes are moist.  Eyes:     General: Vision grossly intact. Gaze aligned appropriately.     Extraocular Movements: Extraocular movements intact.     Conjunctiva/sclera: Conjunctivae normal.  Cardiovascular:     Rate and Rhythm: Normal rate and regular rhythm.     Pulses: Normal pulses.     Heart sounds: Normal heart sounds, S1 normal and S2 normal. No murmur heard.    No friction  rub. No gallop.  Pulmonary:     Effort: Pulmonary effort is normal. No respiratory distress.     Breath sounds: Wheezing present.  Abdominal:     General: Bowel sounds are normal.     Palpations: Abdomen is soft.     Tenderness: There is no abdominal tenderness. There is no guarding or rebound.     Hernia: No hernia is present.  Musculoskeletal:        General: No swelling.     Cervical back: Full passive range of motion without pain, normal range of motion and neck supple. No spinous process tenderness or muscular tenderness. Normal range of motion.     Right lower leg: No edema.     Left lower leg: No edema.  Skin:    General: Skin is warm and dry.     Capillary Refill: Capillary refill takes less than 2 seconds.     Findings: No ecchymosis, erythema, rash or wound.  Neurological:     General: No focal deficit present.     Mental Status: She is alert and oriented to person, place, and time.     GCS: GCS eye subscore is 4. GCS verbal subscore is 5. GCS motor subscore is 6.     Cranial Nerves: Cranial nerves 2-12 are intact.     Sensory: Sensation is intact.     Motor: Motor function is intact.     Coordination: Coordination is intact.  Psychiatric:        Attention and Perception: Attention normal.        Mood and Affect: Mood normal.        Speech: Speech normal.        Behavior: Behavior normal.     ED Results / Procedures / Treatments   Labs (all labs ordered are listed, but only abnormal results are displayed) Labs Reviewed  BASIC METABOLIC PANEL - Abnormal; Notable for the following components:      Result Value   Glucose, Bld 200 (*)    Creatinine, Ser 1.17 (*)    GFR, Estimated 49 (*)    Anion gap 16 (*)    All other components within normal limits  CBC WITH DIFFERENTIAL/PLATELET - Abnormal; Notable for the following components:   WBC 11.6 (*)    Neutro Abs 8.7 (*)    All other components within normal limits  LACTIC ACID, PLASMA - Abnormal; Notable for the  following components:   Lactic Acid, Venous 2.2 (*)    All other components within normal limits  HEPATIC FUNCTION PANEL - Abnormal; Notable for the following components:   Indirect Bilirubin 1.0 (*)    All other components within normal limits  TROPONIN I (HIGH SENSITIVITY) - Abnormal; Notable for the following components:   Troponin I (High Sensitivity) 25 (*)    All other components within normal limits  TROPONIN I (HIGH SENSITIVITY) - Abnormal; Notable for the following components:   Troponin I (High Sensitivity) 27 (*)    All other components within normal limits  RESP PANEL BY RT-PCR (FLU A&B, COVID) ARPGX2  CULTURE, BLOOD (ROUTINE X 2)  CULTURE, BLOOD (ROUTINE X 2)  LACTIC ACID, PLASMA  LIPASE, BLOOD  URINALYSIS, ROUTINE W REFLEX MICROSCOPIC    EKG EKG Interpretation  Date/Time:  Tuesday April 17 2022 02:29:49 EST Ventricular Rate:  97 PR Interval:    QRS Duration: 68 QT Interval:  334 QTC Calculation: 424 R Axis:   -33 Text Interpretation: Sinus rhythm with first degree AV block Left axis deviation Septal infarct , age undetermined Abnormal ECG When compared with ECG of 26-Oct-2021 10:27, Second degree heart block is no longer present Confirmed by Delora Fuel ([id number]) on 04/17/2022 3:28:51 AM  Radiology DG Chest 2 View  Result Date: 04/17/2022 CLINICAL DATA:  Shortness of breath and weakness. EXAM: CHEST - 2 VIEW COMPARISON:  Oct 26, 2021 FINDINGS: The heart size and mediastinal contours are within normal limits. Low lung volumes are seen with subsequent crowding of the bronchovascular lung markings. Mild right perihilar and left basilar atelectasis and/or early infiltrate is seen. There is no evidence of a pleural effusion or pneumothorax. The visualized skeletal structures are unremarkable. IMPRESSION: Low lung volumes with mild right perihilar and left basilar atelectasis and/or early infiltrate. Electronically Signed   By: Virgina Norfolk M.D.   On: 04/17/2022 02:30     Procedures Procedures    Medications Ordered in ED Medications  ipratropium-albuterol (DUONEB) 0.5-2.5 (3) MG/3ML nebulizer solution 3 mL (3 mLs Nebulization Given 04/17/22 0552)  iohexol (OMNIPAQUE) 350 MG/ML injection 50 mL (50 mLs Intravenous Contrast Given 04/17/22 1027)    ED Course/ Medical Decision Making/ A&P                           Medical Decision Making Amount and/or Complexity of Data Reviewed Independent Historian: caregiver External Data Reviewed: ECG and notes. Labs: ordered. Decision-making details documented in ED Course. Radiology: ordered and independent interpretation performed. Decision-making details documented in ED Course. ECG/medicine tests: ordered and independent interpretation performed. Decision-making details documented in ED Course.   Presents to the emergency department for evaluation of URI symptoms with nausea, vomiting, confusion.  Patient noted to have wheezing at arrival.  She does not have a history of COPD or asthma.  She does have a history of low-grade neuroendocrine carcinoma primarily in lungs.  Differential diagnosis considered includes, not limited to, community-acquired pneumonia, acute bronchitis with bronchospasm, COVID/influenza, ACS, congestive heart failure, PE.  Chest x-ray with mild right perihilar and left basilar atelectasis or early infiltrate.  Patient blood work essentially normal, white blood cell count slightly elevated at 11, creatinine 1.17.  Troponin, however, is elevated at 25.  She has had some mildly elevated troponins in the past.  No chest pain currently.  EKG without ischemic changes.  Will need to cycle further troponins, this could be secondary to leak from other general medical condition, congestive heart failure, strain from PE.  Awaiting CT angiography.  Will sign out to oncoming ER physician to follow-up results of additional studies.          Final Clinical Impression(s) / ED Diagnoses Final  diagnoses:  Nausea and vomiting, unspecified vomiting type  Bronchospasm    Rx / DC Orders ED Discharge Orders     None         Orpah Greek, MD 04/17/22 [phone]

## 2022-04-17 NOTE — ED Triage Notes (Addendum)
Patient with nausea and vomiting, having trouble keeping water down.  Daughter states that mother was altered for a bit. She was not able to follow directions on how to use an inhaler.  She stated she did not know how to stand up.  Patient does have Lung CA, she is having some mild wheezing.  She has been having trouble drinking and eating, not having enough intake.

## 2022-04-22 LAB — CULTURE, BLOOD (ROUTINE X 2)
Culture: NO GROWTH
Culture: NO GROWTH
Special Requests: ADEQUATE
Special Requests: ADEQUATE

## 2022-04-24 DIAGNOSIS — I10 Essential (primary) hypertension: Secondary | ICD-10-CM | POA: Diagnosis not present

## 2022-04-24 DIAGNOSIS — F01B0 Vascular dementia, moderate, without behavioral disturbance, psychotic disturbance, mood disturbance, and anxiety: Secondary | ICD-10-CM | POA: Diagnosis not present

## 2022-04-24 DIAGNOSIS — M47816 Spondylosis without myelopathy or radiculopathy, lumbar region: Secondary | ICD-10-CM | POA: Diagnosis not present

## 2022-04-25 DIAGNOSIS — M47816 Spondylosis without myelopathy or radiculopathy, lumbar region: Secondary | ICD-10-CM | POA: Diagnosis not present

## 2022-04-25 DIAGNOSIS — M5136 Other intervertebral disc degeneration, lumbar region: Secondary | ICD-10-CM | POA: Diagnosis not present

## 2022-04-26 DIAGNOSIS — M47816 Spondylosis without myelopathy or radiculopathy, lumbar region: Secondary | ICD-10-CM | POA: Diagnosis not present

## 2022-05-01 DIAGNOSIS — M47816 Spondylosis without myelopathy or radiculopathy, lumbar region: Secondary | ICD-10-CM | POA: Diagnosis not present

## 2022-05-08 DIAGNOSIS — M47816 Spondylosis without myelopathy or radiculopathy, lumbar region: Secondary | ICD-10-CM | POA: Diagnosis not present

## 2022-05-09 DIAGNOSIS — F322 Major depressive disorder, single episode, severe without psychotic features: Secondary | ICD-10-CM | POA: Diagnosis not present

## 2022-05-09 DIAGNOSIS — F03B0 Unspecified dementia, moderate, without behavioral disturbance, psychotic disturbance, mood disturbance, and anxiety: Secondary | ICD-10-CM | POA: Diagnosis not present

## 2022-05-09 DIAGNOSIS — E7800 Pure hypercholesterolemia, unspecified: Secondary | ICD-10-CM | POA: Diagnosis not present

## 2022-05-09 DIAGNOSIS — K219 Gastro-esophageal reflux disease without esophagitis: Secondary | ICD-10-CM | POA: Diagnosis not present

## 2022-05-09 DIAGNOSIS — E1122 Type 2 diabetes mellitus with diabetic chronic kidney disease: Secondary | ICD-10-CM | POA: Diagnosis not present

## 2022-05-09 DIAGNOSIS — I10 Essential (primary) hypertension: Secondary | ICD-10-CM | POA: Diagnosis not present

## 2022-05-09 DIAGNOSIS — Z0000 Encounter for general adult medical examination without abnormal findings: Secondary | ICD-10-CM | POA: Diagnosis not present

## 2022-05-10 DIAGNOSIS — M47816 Spondylosis without myelopathy or radiculopathy, lumbar region: Secondary | ICD-10-CM | POA: Diagnosis not present

## 2022-05-15 DIAGNOSIS — M47816 Spondylosis without myelopathy or radiculopathy, lumbar region: Secondary | ICD-10-CM | POA: Diagnosis not present

## 2022-05-17 DIAGNOSIS — M47816 Spondylosis without myelopathy or radiculopathy, lumbar region: Secondary | ICD-10-CM | POA: Diagnosis not present

## 2022-05-23 ENCOUNTER — Encounter: Payer: Self-pay | Admitting: Diagnostic Neuroimaging

## 2022-05-23 ENCOUNTER — Ambulatory Visit (INDEPENDENT_AMBULATORY_CARE_PROVIDER_SITE_OTHER): Payer: Medicare Other | Admitting: Diagnostic Neuroimaging

## 2022-05-23 VITALS — BP 150/89 | HR 66 | Ht 61.0 in | Wt 175.6 lb

## 2022-05-23 DIAGNOSIS — R413 Other amnesia: Secondary | ICD-10-CM

## 2022-05-23 DIAGNOSIS — H401131 Primary open-angle glaucoma, bilateral, mild stage: Secondary | ICD-10-CM | POA: Diagnosis not present

## 2022-05-23 NOTE — Progress Notes (Signed)
[facility]  PATIENT: Michaela Santana DOB: 1945-08-28  REFERRING CLINICIAN: [name], * HISTORY FROM: patient and daughters REASON FOR VISIT: new consult   HISTORICAL  CHIEF COMPLAINT:  Chief Complaint  Patient presents with   Room 7    Pt is here with her Daughters. Pt's daughters states that her memory started in May she was out of it and her b-12 was low. Since May her memory has been bad.     HISTORY OF PRESENT ILLNESS:   76 year old female here for evaluation memory loss.  Has had gradual onset progressive short-term memory loss, confusion, repeating herself, paranoia, decline in ADLs starting in November 2022.  May 2023 she had more acute delirium and confusion.  She was diagnosed with B12 deficiency.  After treatment she slightly improved but has continued to decline over time.  Patient had neuropsychology testing and was diagnosed with delirium although underlying dementia could not be ruled out.  She is another neurologist who also diagnosed delirium and dementia, started patient on Aricept.  Patient and daughter's request a second opinion with me.  Patient now living at home and daughters alternate taking care of her.  She is no longer driving.   REVIEW OF SYSTEMS: Full 14 system review of systems performed and negative with exception of: as per HPI.  ALLERGIES: Allergies  Allergen Reactions   Codeine Hypertension   Alprazolam Other (See Comments)    lethargic    HOME MEDICATIONS: Outpatient Medications Prior to Visit  Medication Sig Dispense Refill   acetaminophen (TYLENOL) 650 MG CR tablet Take 650 mg by mouth every 8 (eight) hours as needed for pain.     albuterol (VENTOLIN HFA) 108 (90 Base) MCG/ACT inhaler Inhale into the lungs every 6 (six) hours as needed for wheezing or shortness of breath.     amLODipine (NORVASC) 5 MG tablet Take 5 mg by mouth daily.     Artificial Tear Solution (SYSTANE CONTACTS OP) Apply 0.4 drops to eye  as needed.     Blood Glucose Monitoring Suppl (FREESTYLE LITE) DEVI USE UTD  0   calcium carbonate (TUMS - DOSED IN MG ELEMENTAL CALCIUM) 500 MG chewable tablet Chew 1 tablet by mouth daily.     DULoxetine HCl 40 MG CPEP Take 40 mg by mouth daily.     ferrous sulfate 325 (65 FE) MG EC tablet Take 325 mg by mouth daily.     FREESTYLE LITE test strip USE TO CHECK FASTING GLUCOSE IN THE MORNING AND 2 HOURS AFTER DINNER OR LUNCH  3   gabapentin (NEURONTIN) 300 MG capsule Take 300 mg by mouth every evening.     glipiZIDE (GLUCOTROL XL) 5 MG 24 hr tablet Take 5 mg by mouth daily with breakfast.     Lancets (FREESTYLE) lancets      loratadine (CLARITIN) 10 MG tablet Take 10 mg by mouth daily as needed for allergies.     LORazepam (ATIVAN) 0.5 MG tablet Take 0.5 mg by mouth every 8 (eight) hours.     losartan (COZAAR) 100 MG tablet Take 100 mg by mouth daily.     magnesium oxide (MAG-OX) 400 (240 Mg) MG tablet Take 800 mg by mouth 2 (two) times daily.     ondansetron (ZOFRAN) 8 MG tablet Take 8 mg by mouth every 8 (eight) hours as needed for nausea/vomiting.     oxymetazoline (AFRIN) 0.05 % nasal spray Place 1 spray into both nostrils 2 (two) times daily.  Propylene Glycol (SYSTANE BALANCE) 0.6 % SOLN Place 1 drop into both eyes daily as needed (dry eyes).     QUEtiapine (SEROQUEL) 25 MG tablet Take 25 mg by mouth at bedtime.     vitamin B-12 (CYANOCOBALAMIN) 1000 MCG tablet Take 1 tablet (1,000 mcg total) by mouth daily. 30 tablet 0   atorvastatin (LIPITOR) 10 MG tablet Take 10 mg by mouth daily. (Patient not taking: Reported on 05/23/2022)     ciprofloxacin (CIPRO) 500 MG tablet Take 500 mg by mouth 2 (two) times daily. 1 tablet every 12 hours (Patient not taking: Reported on 05/23/2022)     famotidine (PEPCID) 20 MG tablet Take 20 mg by mouth daily. (Patient not taking: Reported on 05/23/2022)     melatonin 3 MG TABS tablet Take 3 mg by mouth at bedtime. (Patient not taking: Reported on  05/23/2022)     metFORMIN (GLUCOPHAGE-XR) 500 MG 24 hr tablet Take 500-1,000 mg by mouth See admin instructions. Take 1 tablet ( 500 mg) in the morning and 2 tablets (1000 mg) in the evening (Patient not taking: Reported on 05/23/2022)     Multiple Vitamins-Minerals (EMERGEN-C IMMUNE) PACK Take 1 packet by mouth as needed (once every other day as needed). (Patient not taking: Reported on 05/23/2022)     nitrofurantoin, macrocrystal-monohydrate, (MACROBID) 100 MG capsule Take 100 mg by mouth 2 (two) times daily. (Patient not taking: Reported on 05/23/2022)     Vitamin D, Ergocalciferol, (DRISDOL) 50000 units CAPS capsule Take 50,000 Units by mouth every Friday. (Patient not taking: Reported on 05/23/2022)     No facility-administered medications prior to visit.    PAST MEDICAL HISTORY: Past Medical History:  Diagnosis Date   Allergic rhinitis    Anemia    Arthritis    "probably in hands" (07/17/2016)   CKD (chronic kidney disease), stage III (HCC)    Depression    Esophageal reflux    Family history of adverse reaction to anesthesia    "her father had some issues when he had his gallbladder taken out when he was in his late 34s"   GERD (gastroesophageal reflux disease)    Glaucoma    open angle, bilateral   High cholesterol    History of kidney stones    Hypercholesterolemia    Hypertension    Kidney cysts    Lumbago with sciatica, right side    Sepsis ([place]) 07/2016   d/t flu   Type II diabetes mellitus ([place])    Vitamin D deficiency     PAST SURGICAL HISTORY: Past Surgical History:  Procedure Laterality Date   BREAST BIOPSY  1990   "? side; it wasn't anything"   BRONCHIAL BIOPSY  08/01/2021   Procedure: BRONCHIAL BIOPSIES;  Surgeon: Garner Nash, DO;  Location: [facility];  Service: Pulmonary;;   BRONCHIAL NEEDLE ASPIRATION BIOPSY  08/01/2021   Procedure: BRONCHIAL NEEDLE ASPIRATION BIOPSIES;  Surgeon: Garner Nash, DO;  Location: [facility];  Service: Pulmonary;;    CATARACT EXTRACTION, BILATERAL  2021   CHOLECYSTECTOMY OPEN  1971   COLONOSCOPY     2001,2011,2023   TUBAL LIGATION  1978   VIDEO BRONCHOSCOPY WITH RADIAL ENDOBRONCHIAL ULTRASOUND  08/01/2021   Procedure: RADIAL ENDOBRONCHIAL ULTRASOUND;  Surgeon: Garner Nash, DO;  Location: [facility];  Service: Pulmonary;;    FAMILY HISTORY: Family History  Problem Relation Age of Onset   Dementia Mother    Diabetes Mother    Stroke Father    Hypertension Father  CVA Father    Diabetes Sister    Hypertension Sister    Heart disease Paternal Grandfather    Breast cancer Neg Hx     SOCIAL HISTORY: Social History   Socioeconomic History   Marital status: Widowed    Spouse name: Not on file   Number of children: 2   Years of education: Not on file   Highest education level: Some college, no degree  Occupational History   Occupation: Environmental consultant  Tobacco Use   Smoking status: Never   Smokeless tobacco: Never  Vaping Use   Vaping Use: Never used  Substance and Sexual Activity   Alcohol use: Never   Drug use: Never   Sexual activity: Not on file  Other Topics Concern   Not on file  Social History Narrative   02/08/20 Lives alone   caffeine soda 1-2 daily   Social Determinants of Health   Financial Resource Strain: Not on file  Food Insecurity: Not on file  Transportation Needs: Not on file  Physical Activity: Not on file  Stress: Not on file  Social Connections: Not on file  Intimate Partner Violence: Not on file     PHYSICAL EXAM  GENERAL EXAM/CONSTITUTIONAL: Vitals:  Vitals:   05/23/22 1057  BP: (!) 150/89  Pulse: 66  Weight: 175 lb 9.6 oz (79.7 kg)  Height: 5\' 1"  (1.549 m)   Body mass index is 33.18 kg/m. Wt Readings from Last 3 Encounters:  05/23/22 175 lb 9.6 oz (79.7 kg)  03/08/22 172 lb 6.4 oz (78.2 kg)  10/27/21 175 lb 1.6 oz (79.4 kg)   Patient is in no distress; well developed, nourished and groomed; neck is  supple  CARDIOVASCULAR: Examination of carotid arteries is normal; no carotid bruits Regular rate and rhythm, no murmurs Examination of peripheral vascular system by observation and palpation is normal  EYES: Ophthalmoscopic exam of optic discs and posterior segments is normal; no papilledema or hemorrhages No results found.  MUSCULOSKELETAL: Gait, strength, tone, movements noted in Neurologic exam below  NEUROLOGIC: MENTAL STATUS:     05/23/2022   11:01 AM  MMSE - Mini Mental State Exam  Orientation to time 5  Orientation to Place 4  Registration 3  Attention/ Calculation 0  Recall 1  Language- name 2 objects 2  Language- repeat 1  Language- follow 3 step command 3  Language- read & follow direction 1  Write a sentence 1  Copy design 1  Total score 22   awake, alert, oriented to person, place and time recent and remote memory intact normal attention and concentration language fluent, comprehension intact, naming intact fund of knowledge appropriate  CRANIAL NERVE:  2nd - no papilledema on fundoscopic exam 2nd, 3rd, 4th, 6th - pupils equal and reactive to light, visual fields full to confrontation, extraocular muscles intact, no nystagmus 5th - facial sensation symmetric 7th - facial strength symmetric 8th - hearing intact 9th - palate elevates symmetrically, uvula midline 11th - shoulder shrug symmetric 12th - tongue protrusion midline  MOTOR:  normal bulk and tone, full strength in the BUE, BLE  SENSORY:  normal and symmetric to light touch  COORDINATION:  finger-nose-finger, fine finger movements normal  REFLEXES:  deep tendon reflexes TRACE and symmetric  GAIT/STATION:  narrow based gait; USING CANE     DIAGNOSTIC DATA (LABS, IMAGING, TESTING) - I reviewed patient records, labs, notes, testing and imaging myself where available.  Lab Results  Component Value Date   WBC 11.6 (H) 04/17/2022  HGB 13.9 04/17/2022   HCT 41.0 04/17/2022   MCV  97.4 04/17/2022   PLT 277 04/17/2022      Component Value Date/Time   NA 137 04/17/2022 0207   K 4.0 04/17/2022 0207   CL 99 04/17/2022 0207   CO2 22 04/17/2022 0207   GLUCOSE 200 (H) 04/17/2022 0207   BUN 16 04/17/2022 0207   CREATININE 1.17 (H) 04/17/2022 0207   CREATININE 1.00 03/06/2022 1213   CALCIUM 10.0 04/17/2022 0207   PROT 7.1 04/17/2022 0207   ALBUMIN 3.8 04/17/2022 0207   AST 21 04/17/2022 0207   AST 18 03/06/2022 1213   ALT 23 04/17/2022 0207   ALT 21 03/06/2022 1213   ALKPHOS 81 04/17/2022 0207   BILITOT 1.1 04/17/2022 0207   BILITOT 0.6 03/06/2022 1213   GFRNONAA 49 (L) 04/17/2022 0207   GFRNONAA 59 (L) 03/06/2022 1213   GFRAA 29 (L) 07/21/2016 0446   No results found for: "CHOL", "HDL", "LDLCALC", "LDLDIRECT", "TRIG", "CHOLHDL" Lab Results  Component Value Date   HGBA1C 6.8 (H) 10/27/2021   Lab Results  Component Value Date   VITAMINB12 128 (L) 10/27/2021   Lab Results  Component Value Date   TSH 1.819 10/26/2021    10/26/21 MRI brain [I reviewed images myself and agree with interpretation. -VRP]  1. Motion degraded study without acute intracranial abnormality. 2. Findings of chronic microvascular ischemia and cerebral volume loss.   ASSESSMENT AND PLAN  77 y.o. year old female here with:   Dx:  1. Memory loss      PLAN:  MEMORY LOSS / DECLINE IN ADLS (since 2993; mild dementia; + B12 deficiency; + depression) - consider memantine 10mg  at bedtime; increase to twice a day after 1-2 weeks - safety / supervision issues reviewed - daily physical activity / exercise (at least 15-30 minutes) - eat more plants / vegetables - increase social activities, brain stimulation, games, puzzles, hobbies, crafts, arts, music - aim for at least 7-8 hours sleep per night (or more) - avoid smoking and alcohol - caregiver resources provided - caution with medications, finances; no driving  Return for pending if symptoms worsen or fail to improve,  return to PCP.  I spent 65 minutes of face-to-face and non-face-to-face time with patient.  This included previsit chart review, lab review, study review, order entry, electronic health record documentation, patient education.     Penni Bombard, MD 71/69/6789, 38:10 PM Certified in Neurology, Neurophysiology and Neuroimaging  [facility] [address] [place], [place] [address] [phone]

## 2022-05-23 NOTE — Patient Instructions (Addendum)
  MEMORY LOSS (mild dementia; + B12 deficiency; + depression) - consider memantine 10mg  at bedtime; increase to twice a day after 1-2 weeks - safety / supervision issues reviewed - daily physical activity / exercise (at least 15-30 minutes) - eat more plants / vegetables - increase social activities, brain stimulation, games, puzzles, hobbies, crafts, arts, music - aim for at least 7-8 hours sleep per night (or more) - avoid smoking and alcohol - caregiver resources provided - caution with medications, finances; no driving

## 2022-06-20 DIAGNOSIS — M47816 Spondylosis without myelopathy or radiculopathy, lumbar region: Secondary | ICD-10-CM | POA: Diagnosis not present

## 2022-06-22 DIAGNOSIS — M47816 Spondylosis without myelopathy or radiculopathy, lumbar region: Secondary | ICD-10-CM | POA: Diagnosis not present

## 2022-06-27 DIAGNOSIS — M47816 Spondylosis without myelopathy or radiculopathy, lumbar region: Secondary | ICD-10-CM | POA: Diagnosis not present

## 2022-07-02 DIAGNOSIS — M47816 Spondylosis without myelopathy or radiculopathy, lumbar region: Secondary | ICD-10-CM | POA: Diagnosis not present

## 2022-07-04 DIAGNOSIS — M47816 Spondylosis without myelopathy or radiculopathy, lumbar region: Secondary | ICD-10-CM | POA: Diagnosis not present

## 2022-07-09 DIAGNOSIS — M47816 Spondylosis without myelopathy or radiculopathy, lumbar region: Secondary | ICD-10-CM | POA: Diagnosis not present

## 2022-07-11 DIAGNOSIS — M47816 Spondylosis without myelopathy or radiculopathy, lumbar region: Secondary | ICD-10-CM | POA: Diagnosis not present

## 2022-07-18 DIAGNOSIS — M47816 Spondylosis without myelopathy or radiculopathy, lumbar region: Secondary | ICD-10-CM | POA: Diagnosis not present

## 2022-08-22 DIAGNOSIS — H401131 Primary open-angle glaucoma, bilateral, mild stage: Secondary | ICD-10-CM | POA: Diagnosis not present

## 2022-08-27 ENCOUNTER — Other Ambulatory Visit: Payer: Medicare Other

## 2022-08-31 ENCOUNTER — Inpatient Hospital Stay: Payer: Medicare Other | Attending: Internal Medicine

## 2022-08-31 ENCOUNTER — Encounter (HOSPITAL_COMMUNITY): Payer: Self-pay

## 2022-08-31 ENCOUNTER — Ambulatory Visit (HOSPITAL_COMMUNITY)
Admission: RE | Admit: 2022-08-31 | Discharge: 2022-08-31 | Disposition: A | Discharge status: Home / Self Care | Payer: Medicare Other | Source: Ambulatory Visit | Attending: Internal Medicine | Admitting: Internal Medicine

## 2022-08-31 DIAGNOSIS — N183 Chronic kidney disease, stage 3 unspecified: Secondary | ICD-10-CM | POA: Insufficient documentation

## 2022-08-31 DIAGNOSIS — Z7984 Long term (current) use of oral hypoglycemic drugs: Secondary | ICD-10-CM | POA: Insufficient documentation

## 2022-08-31 DIAGNOSIS — I129 Hypertensive chronic kidney disease with stage 1 through stage 4 chronic kidney disease, or unspecified chronic kidney disease: Secondary | ICD-10-CM | POA: Insufficient documentation

## 2022-08-31 DIAGNOSIS — Z79899 Other long term (current) drug therapy: Secondary | ICD-10-CM | POA: Insufficient documentation

## 2022-08-31 DIAGNOSIS — E1122 Type 2 diabetes mellitus with diabetic chronic kidney disease: Secondary | ICD-10-CM | POA: Insufficient documentation

## 2022-08-31 DIAGNOSIS — C3492 Malignant neoplasm of unspecified part of left bronchus or lung: Secondary | ICD-10-CM

## 2022-08-31 DIAGNOSIS — R918 Other nonspecific abnormal finding of lung field: Secondary | ICD-10-CM | POA: Diagnosis not present

## 2022-08-31 DIAGNOSIS — C7A090 Malignant carcinoid tumor of the bronchus and lung: Secondary | ICD-10-CM | POA: Insufficient documentation

## 2022-08-31 DIAGNOSIS — C7A8 Other malignant neuroendocrine tumors: Secondary | ICD-10-CM | POA: Diagnosis not present

## 2022-08-31 LAB — CBC WITH DIFFERENTIAL (CANCER CENTER ONLY)
Abs Immature Granulocytes: 0.02 10*3/uL (ref 0.00–0.07)
Basophils Absolute: 0.1 10*3/uL (ref 0.0–0.1)
Basophils Relative: 1 %
Eosinophils Absolute: 0.3 10*3/uL (ref 0.0–0.5)
Eosinophils Relative: 4 %
HCT: 39.4 % (ref 36.0–46.0)
Hemoglobin: 13.3 g/dL (ref 12.0–15.0)
Immature Granulocytes: 0 %
Lymphocytes Relative: 24 %
Lymphs Abs: 1.9 10*3/uL (ref 0.7–4.0)
MCH: 33.5 pg (ref 26.0–34.0)
MCHC: 33.8 g/dL (ref 30.0–36.0)
MCV: 99.2 fL (ref 80.0–100.0)
Monocytes Absolute: 0.6 10*3/uL (ref 0.1–1.0)
Monocytes Relative: 8 %
Neutro Abs: 4.9 10*3/uL (ref 1.7–7.7)
Neutrophils Relative %: 63 %
Platelet Count: 286 10*3/uL (ref 150–400)
RBC: 3.97 MIL/uL (ref 3.87–5.11)
RDW: 12.8 % (ref 11.5–15.5)
WBC Count: 7.8 10*3/uL (ref 4.0–10.5)
nRBC: 0 % (ref 0.0–0.2)

## 2022-08-31 LAB — CMP (CANCER CENTER ONLY)
ALT: 18 U/L (ref 0–44)
AST: 15 U/L (ref 15–41)
Albumin: 4 g/dL (ref 3.5–5.0)
Alkaline Phosphatase: 74 U/L (ref 38–126)
Anion gap: 7 (ref 5–15)
BUN: 24 mg/dL — ABNORMAL HIGH (ref 8–23)
CO2: 26 mmol/L (ref 22–32)
Calcium: 9.6 mg/dL (ref 8.9–10.3)
Chloride: 106 mmol/L (ref 98–111)
Creatinine: 1.11 mg/dL — ABNORMAL HIGH (ref 0.44–1.00)
GFR, Estimated: 52 mL/min — ABNORMAL LOW (ref >60)
Glucose, Bld: 198 mg/dL — ABNORMAL HIGH (ref 70–99)
Potassium: 4.4 mmol/L (ref 3.5–5.1)
Sodium: 139 mmol/L (ref 135–145)
Total Bilirubin: 0.5 mg/dL (ref 0.3–1.2)
Total Protein: 6.4 g/dL — ABNORMAL LOW (ref 6.5–8.1)

## 2022-08-31 MED ORDER — IOHEXOL 300 MG/ML  SOLN
75.0000 mL | Freq: Once | INTRAMUSCULAR | Status: AC | PRN
  Administered 2022-08-31: 75 mL via INTRAVENOUS

## 2022-08-31 MED ORDER — SODIUM CHLORIDE (PF) 0.9 % IJ SOLN
INTRAMUSCULAR | Status: AC
  Filled 2022-08-31: qty 50

## 2022-09-03 LAB — CHROMOGRANIN A: Chromogranin A (ng/mL): 367.4 ng/mL — ABNORMAL HIGH (ref 0.0–101.8)

## 2022-09-06 ENCOUNTER — Inpatient Hospital Stay (HOSPITAL_BASED_OUTPATIENT_CLINIC_OR_DEPARTMENT_OTHER): Payer: Medicare Other | Admitting: Internal Medicine

## 2022-09-06 ENCOUNTER — Other Ambulatory Visit: Payer: Self-pay

## 2022-09-06 VITALS — BP 176/63 | HR 44 | Temp 97.6°F | Resp 19 | Wt 184.8 lb

## 2022-09-06 DIAGNOSIS — N183 Chronic kidney disease, stage 3 unspecified: Secondary | ICD-10-CM | POA: Diagnosis not present

## 2022-09-06 DIAGNOSIS — C3490 Malignant neoplasm of unspecified part of unspecified bronchus or lung: Secondary | ICD-10-CM

## 2022-09-06 DIAGNOSIS — C7A090 Malignant carcinoid tumor of the bronchus and lung: Secondary | ICD-10-CM | POA: Diagnosis not present

## 2022-09-06 DIAGNOSIS — Z7984 Long term (current) use of oral hypoglycemic drugs: Secondary | ICD-10-CM | POA: Diagnosis not present

## 2022-09-06 DIAGNOSIS — Z79899 Other long term (current) drug therapy: Secondary | ICD-10-CM | POA: Diagnosis not present

## 2022-09-06 DIAGNOSIS — I129 Hypertensive chronic kidney disease with stage 1 through stage 4 chronic kidney disease, or unspecified chronic kidney disease: Secondary | ICD-10-CM | POA: Diagnosis not present

## 2022-09-06 DIAGNOSIS — E1122 Type 2 diabetes mellitus with diabetic chronic kidney disease: Secondary | ICD-10-CM | POA: Diagnosis not present

## 2022-09-06 NOTE — Progress Notes (Signed)
[facility] Telephone:([phone]) [phone]   Fax:(336) [phone]  OFFICE PROGRESS NOTE  Michaela Smoker, MD [place] [address]  DIAGNOSIS: Stage IV (T1b, N0, M1a) low-grade neuroendocrine carcinoma (carcinoid tumor), presented with innumerable bilateral pulmonary nodules diagnosed in March 2023.  PRIOR THERAPY: None  CURRENT THERAPY: Observation  INTERVAL HISTORY: Michaela Santana 77 y.o. female returns to the clinic today for follow-up visit accompanied by her daughter.  The patient is feeling fine today with no concerning complaints except for fatigue.  She denied having any current chest pain, shortness of breath, cough or hemoptysis.  She has no nausea, vomiting, diarrhea or constipation.  She has no headache or visual changes.  She is here today for evaluation with repeat CT scan of the chest for restaging of her disease.  MEDICAL HISTORY: Past Medical History:  Diagnosis Date   Allergic rhinitis    Anemia    Arthritis    "probably in hands" (07/17/2016)   CKD (chronic kidney disease), stage III (HCC)    Depression    Esophageal reflux    Family history of adverse reaction to anesthesia    "her father had some issues when he had his gallbladder taken out when he was in his late 32s"   GERD (gastroesophageal reflux disease)    Glaucoma    open angle, bilateral   High cholesterol    History of kidney stones    Hypercholesterolemia    Hypertension    Kidney cysts    Lumbago with sciatica, right side    Sepsis ([place]) 07/2016   d/t flu   Type II diabetes mellitus ([place])    Vitamin D deficiency     ALLERGIES:  is allergic to codeine and alprazolam.  MEDICATIONS:  Current Outpatient Medications  Medication Sig Dispense Refill   acetaminophen (TYLENOL) 650 MG CR tablet Take 650 mg by mouth every 8 (eight) hours as needed for pain.     albuterol (VENTOLIN HFA) 108 (90 Base) MCG/ACT inhaler Inhale into the lungs every 6 (six) hours  as needed for wheezing or shortness of breath.     amLODipine (NORVASC) 5 MG tablet Take 5 mg by mouth daily.     Artificial Tear Solution (SYSTANE CONTACTS OP) Apply 0.4 drops to eye as needed.     atorvastatin (LIPITOR) 10 MG tablet Take 10 mg by mouth daily. (Patient not taking: Reported on 05/23/2022)     Blood Glucose Monitoring Suppl (FREESTYLE LITE) DEVI USE UTD  0   calcium carbonate (TUMS - DOSED IN MG ELEMENTAL CALCIUM) 500 MG chewable tablet Chew 1 tablet by mouth daily.     ciprofloxacin (CIPRO) 500 MG tablet Take 500 mg by mouth 2 (two) times daily. 1 tablet every 12 hours (Patient not taking: Reported on 05/23/2022)     DULoxetine HCl 40 MG CPEP Take 40 mg by mouth daily.     famotidine (PEPCID) 20 MG tablet Take 20 mg by mouth daily. (Patient not taking: Reported on 05/23/2022)     ferrous sulfate 325 (65 FE) MG EC tablet Take 325 mg by mouth daily.     FREESTYLE LITE test strip USE TO CHECK FASTING GLUCOSE IN THE MORNING AND 2 HOURS AFTER DINNER OR LUNCH  3   gabapentin (NEURONTIN) 300 MG capsule Take 300 mg by mouth every evening.     glipiZIDE (GLUCOTROL XL) 5 MG 24 hr tablet Take 5 mg by mouth daily with breakfast.  Lancets (FREESTYLE) lancets      loratadine (CLARITIN) 10 MG tablet Take 10 mg by mouth daily as needed for allergies.     LORazepam (ATIVAN) 0.5 MG tablet Take 0.5 mg by mouth every 8 (eight) hours.     losartan (COZAAR) 100 MG tablet Take 100 mg by mouth daily.     magnesium oxide (MAG-OX) 400 (240 Mg) MG tablet Take 800 mg by mouth 2 (two) times daily.     melatonin 3 MG TABS tablet Take 3 mg by mouth at bedtime. (Patient not taking: Reported on 05/23/2022)     metFORMIN (GLUCOPHAGE-XR) 500 MG 24 hr tablet Take 500-1,000 mg by mouth See admin instructions. Take 1 tablet ( 500 mg) in the morning and 2 tablets (1000 mg) in the evening (Patient not taking: Reported on 05/23/2022)     Multiple Vitamins-Minerals (EMERGEN-C IMMUNE) PACK Take 1 packet by mouth as  needed (once every other day as needed). (Patient not taking: Reported on 05/23/2022)     nitrofurantoin, macrocrystal-monohydrate, (MACROBID) 100 MG capsule Take 100 mg by mouth 2 (two) times daily. (Patient not taking: Reported on 05/23/2022)     ondansetron (ZOFRAN) 8 MG tablet Take 8 mg by mouth every 8 (eight) hours as needed for nausea/vomiting.     oxymetazoline (AFRIN) 0.05 % nasal spray Place 1 spray into both nostrils 2 (two) times daily.     Propylene Glycol (SYSTANE BALANCE) 0.6 % SOLN Place 1 drop into both eyes daily as needed (dry eyes).     QUEtiapine (SEROQUEL) 25 MG tablet Take 25 mg by mouth at bedtime.     vitamin B-12 (CYANOCOBALAMIN) 1000 MCG tablet Take 1 tablet (1,000 mcg total) by mouth daily. 30 tablet 0   Vitamin D, Ergocalciferol, (DRISDOL) 50000 units CAPS capsule Take 50,000 Units by mouth every Friday. (Patient not taking: Reported on 05/23/2022)     No current facility-administered medications for this visit.    SURGICAL HISTORY:  Past Surgical History:  Procedure Laterality Date   BREAST BIOPSY  1990   "? side; it wasn't anything"   BRONCHIAL BIOPSY  08/01/2021   Procedure: BRONCHIAL BIOPSIES;  Surgeon: Garner Nash, DO;  Location: [facility];  Service: Pulmonary;;   BRONCHIAL NEEDLE ASPIRATION BIOPSY  08/01/2021   Procedure: BRONCHIAL NEEDLE ASPIRATION BIOPSIES;  Surgeon: Garner Nash, DO;  Location: [facility];  Service: Pulmonary;;   CATARACT EXTRACTION, BILATERAL  2021   CHOLECYSTECTOMY OPEN  1971   COLONOSCOPY     2001,2011,2023   TUBAL LIGATION  1978   VIDEO BRONCHOSCOPY WITH RADIAL ENDOBRONCHIAL ULTRASOUND  08/01/2021   Procedure: RADIAL ENDOBRONCHIAL ULTRASOUND;  Surgeon: Garner Nash, DO;  Location: [facility];  Service: Pulmonary;;    REVIEW OF SYSTEMS:  A comprehensive review of systems was negative except for: Constitutional: positive for fatigue   PHYSICAL EXAMINATION: General appearance: alert, cooperative, fatigued, and  no distress Head: Normocephalic, without obvious abnormality, atraumatic Neck: no adenopathy, no JVD, supple, symmetrical, trachea midline, and thyroid not enlarged, symmetric, no tenderness/mass/nodules Lymph nodes: Cervical, supraclavicular, and axillary nodes normal. Resp: clear to auscultation bilaterally Back: symmetric, no curvature. ROM normal. No CVA tenderness. Cardio: regular rate and rhythm, S1, S2 normal, no murmur, click, rub or gallop GI: soft, non-tender; bowel sounds normal; no masses,  no organomegaly Extremities: extremities normal, atraumatic, no cyanosis or edema  ECOG PERFORMANCE STATUS: 1 - Symptomatic but completely ambulatory  Blood pressure (!) 176/63, pulse (!) 44, temperature 97.6 F (36.4 C), resp. rate  19, weight 184 lb 12.8 oz (83.8 kg), SpO2 98 %.  LABORATORY DATA: Lab Results  Component Value Date   WBC 7.8 08/31/2022   HGB 13.3 08/31/2022   HCT 39.4 08/31/2022   MCV 99.2 08/31/2022   PLT 286 08/31/2022      Chemistry      Component Value Date/Time   NA 139 08/31/2022 0924   K 4.4 08/31/2022 0924   CL 106 08/31/2022 0924   CO2 26 08/31/2022 0924   BUN 24 (H) 08/31/2022 0924   CREATININE 1.11 (H) 08/31/2022 0924      Component Value Date/Time   CALCIUM 9.6 08/31/2022 0924   ALKPHOS 74 08/31/2022 0924   AST 15 08/31/2022 0924   ALT 18 08/31/2022 0924   BILITOT 0.5 08/31/2022 0924       RADIOGRAPHIC STUDIES: CT Chest W Contrast  Result Date: 09/03/2022 CLINICAL DATA:  Low-grade neuroendocrine tumor with innumerable bilateral pulmonary nodules. EXAM: CT CHEST WITH CONTRAST TECHNIQUE: Multidetector CT imaging of the chest was performed during intravenous contrast administration. RADIATION DOSE REDUCTION: This exam was performed according to the departmental dose-optimization program which includes automated exposure control, adjustment of the mA and/or kV according to patient size and/or use of iterative reconstruction technique. CONTRAST:   62mL OMNIPAQUE IOHEXOL 300 MG/ML  SOLN COMPARISON:  04/17/2022 FINDINGS: Cardiovascular: The heart size is normal. No substantial pericardial effusion. Mild atherosclerotic calcification is noted in the wall of the thoracic aorta. Mediastinum/Nodes: No mediastinal lymphadenopathy. There is no hilar lymphadenopathy. Moderate to large hiatal hernia. The esophagus has normal imaging features. There is no axillary lymphadenopathy. Lungs/Pleura: As before, there are innumerable bilateral pulmonary nodules ranging in size from 2-3 mm up to some of the more dominant lesions approaching 12 mm in size. 13 mm index left upper lobe nodule measured previously is 12 mm today on image 48/5. 8 mm index right middle lobe nodule measured previously is 8 mm again today on image 57/5. 11 mm left lower lobe nodule seen on image 90/5 today was 12 mm previously (remeasured). No focal airspace consolidation. No pleural effusion. Upper Abdomen: 2.1 cm focus of heterogeneous enhancement is identified in the interpolar left kidney, visible on the inferior-most image (144/2) is compatible with duplicated intrarenal collecting system is seen on CT abdomen 06/09/2021. This may be related to a duplicated system although mass lesion not excluded. Musculoskeletal: No worrisome lytic or sclerotic osseous abnormality. IMPRESSION: 1. Stable exam. No new or progressive interval findings. 2. Innumerable bilateral pulmonary nodules ranging in size from 2-3 mm up to some of the more dominant lesions approaching 12 mm in size. 3. Moderate to large hiatal hernia. 4.  Aortic Atherosclerosis (ICD10-I70.0). Electronically Signed   By: Misty Stanley M.D.   On: 09/03/2022 09:10    ASSESSMENT AND PLAN: This is a very pleasant 77  years old white female recently diagnosed with a stage IV (T1b, N0, M1 a) low-grade neuroendocrine carcinoma (carcinoid tumor) presented with innumerable bilateral pulmonary nodules diagnosed in March 2023. The recent CT-guided core  biopsy of the left lung nodule was consistent with low-grade neuroendocrine carcinoma. The NCCN guideline would recommend continuous observation and monitoring for patient with bilateral pulmonary nodules consistent with a carcinoid tumor unless the patient is symptomatic then we would consider her for treatment with octreotide LAR to control her symptoms. The patient is currently on observation and she is feeling fine with no concerning complaints. She had repeat CT scan of the chest performed recently.  I personally and  independently reviewed the scan and discussed the result with the patient and her daughter. Her scan showed no concerning findings for disease progression. I recommended for her to continue on observation with repeat CT scan of the chest in 6 months. For the hypertension and bradycardia, the patient was advised to reach out to her cardiologist for adjustment of her medication. She was advised to call immediately if she has any other concerning symptoms in the interval. The patient voices understanding of current disease status and treatment options and is in agreement with the current care plan.  All questions were answered. The patient knows to call the clinic with any problems, questions or concerns. We can certainly see the patient much sooner if necessary.  The total time spent in the appointment was 20 minutes.  Disclaimer: This note was dictated with voice recognition software. Similar sounding words can inadvertently be transcribed and may not be corrected upon review.

## 2022-09-06 NOTE — Addendum Note (Signed)
Addended by: Ardeen Garland on: 09/06/2022 05:05 PM   Modules accepted: Orders

## 2022-09-14 DIAGNOSIS — M545 Low back pain, unspecified: Secondary | ICD-10-CM | POA: Diagnosis not present

## 2022-09-14 DIAGNOSIS — R451 Restlessness and agitation: Secondary | ICD-10-CM | POA: Diagnosis not present

## 2022-09-14 DIAGNOSIS — R1013 Epigastric pain: Secondary | ICD-10-CM | POA: Diagnosis not present

## 2022-09-25 DIAGNOSIS — M5441 Lumbago with sciatica, right side: Secondary | ICD-10-CM | POA: Diagnosis not present

## 2022-09-27 DIAGNOSIS — M5441 Lumbago with sciatica, right side: Secondary | ICD-10-CM | POA: Diagnosis not present

## 2022-10-02 DIAGNOSIS — M5441 Lumbago with sciatica, right side: Secondary | ICD-10-CM | POA: Diagnosis not present

## 2022-10-04 DIAGNOSIS — M5441 Lumbago with sciatica, right side: Secondary | ICD-10-CM | POA: Diagnosis not present

## 2022-10-09 DIAGNOSIS — M5441 Lumbago with sciatica, right side: Secondary | ICD-10-CM | POA: Diagnosis not present

## 2022-10-16 ENCOUNTER — Other Ambulatory Visit: Payer: Self-pay

## 2022-10-16 ENCOUNTER — Inpatient Hospital Stay (HOSPITAL_COMMUNITY)
Admission: EM | Admit: 2022-10-16 | Discharge: 2022-10-22 | DRG: 392 | Disposition: A | Discharge status: Home Health | Payer: Medicare Other | Attending: Family Medicine | Admitting: Family Medicine

## 2022-10-16 DIAGNOSIS — E860 Dehydration: Secondary | ICD-10-CM | POA: Diagnosis present

## 2022-10-16 DIAGNOSIS — N179 Acute kidney failure, unspecified: Secondary | ICD-10-CM | POA: Diagnosis present

## 2022-10-16 DIAGNOSIS — E559 Vitamin D deficiency, unspecified: Secondary | ICD-10-CM | POA: Diagnosis present

## 2022-10-16 DIAGNOSIS — R339 Retention of urine, unspecified: Secondary | ICD-10-CM

## 2022-10-16 DIAGNOSIS — Z9841 Cataract extraction status, right eye: Secondary | ICD-10-CM | POA: Diagnosis not present

## 2022-10-16 DIAGNOSIS — E876 Hypokalemia: Secondary | ICD-10-CM | POA: Diagnosis not present

## 2022-10-16 DIAGNOSIS — Z888 Allergy status to other drugs, medicaments and biological substances status: Secondary | ICD-10-CM

## 2022-10-16 DIAGNOSIS — R4182 Altered mental status, unspecified: Secondary | ICD-10-CM | POA: Diagnosis not present

## 2022-10-16 DIAGNOSIS — Z885 Allergy status to narcotic agent status: Secondary | ICD-10-CM

## 2022-10-16 DIAGNOSIS — D4102 Neoplasm of uncertain behavior of left kidney: Secondary | ICD-10-CM | POA: Diagnosis not present

## 2022-10-16 DIAGNOSIS — A0811 Acute gastroenteropathy due to Norwalk agent: Secondary | ICD-10-CM | POA: Diagnosis present

## 2022-10-16 DIAGNOSIS — Z833 Family history of diabetes mellitus: Secondary | ICD-10-CM

## 2022-10-16 DIAGNOSIS — R197 Diarrhea, unspecified: Secondary | ICD-10-CM | POA: Diagnosis present

## 2022-10-16 DIAGNOSIS — G4710 Hypersomnia, unspecified: Secondary | ICD-10-CM | POA: Diagnosis not present

## 2022-10-16 DIAGNOSIS — R1084 Generalized abdominal pain: Secondary | ICD-10-CM | POA: Diagnosis not present

## 2022-10-16 DIAGNOSIS — R739 Hyperglycemia, unspecified: Secondary | ICD-10-CM | POA: Diagnosis not present

## 2022-10-16 DIAGNOSIS — E1122 Type 2 diabetes mellitus with diabetic chronic kidney disease: Secondary | ICD-10-CM | POA: Diagnosis not present

## 2022-10-16 DIAGNOSIS — H409 Unspecified glaucoma: Secondary | ICD-10-CM | POA: Diagnosis present

## 2022-10-16 DIAGNOSIS — N182 Chronic kidney disease, stage 2 (mild): Secondary | ICD-10-CM | POA: Diagnosis not present

## 2022-10-16 DIAGNOSIS — K6389 Other specified diseases of intestine: Secondary | ICD-10-CM | POA: Diagnosis not present

## 2022-10-16 DIAGNOSIS — Z823 Family history of stroke: Secondary | ICD-10-CM | POA: Diagnosis not present

## 2022-10-16 DIAGNOSIS — Z781 Physical restraint status: Secondary | ICD-10-CM

## 2022-10-16 DIAGNOSIS — Z8249 Family history of ischemic heart disease and other diseases of the circulatory system: Secondary | ICD-10-CM | POA: Diagnosis not present

## 2022-10-16 DIAGNOSIS — E119 Type 2 diabetes mellitus without complications: Secondary | ICD-10-CM | POA: Diagnosis present

## 2022-10-16 DIAGNOSIS — D509 Iron deficiency anemia, unspecified: Secondary | ICD-10-CM | POA: Diagnosis present

## 2022-10-16 DIAGNOSIS — R918 Other nonspecific abnormal finding of lung field: Secondary | ICD-10-CM | POA: Diagnosis present

## 2022-10-16 DIAGNOSIS — K219 Gastro-esophageal reflux disease without esophagitis: Secondary | ICD-10-CM | POA: Diagnosis present

## 2022-10-16 DIAGNOSIS — Z7984 Long term (current) use of oral hypoglycemic drugs: Secondary | ICD-10-CM | POA: Diagnosis not present

## 2022-10-16 DIAGNOSIS — N390 Urinary tract infection, site not specified: Secondary | ICD-10-CM | POA: Diagnosis present

## 2022-10-16 DIAGNOSIS — R112 Nausea with vomiting, unspecified: Secondary | ICD-10-CM | POA: Insufficient documentation

## 2022-10-16 DIAGNOSIS — D72829 Elevated white blood cell count, unspecified: Secondary | ICD-10-CM | POA: Diagnosis not present

## 2022-10-16 DIAGNOSIS — I700 Atherosclerosis of aorta: Secondary | ICD-10-CM | POA: Diagnosis not present

## 2022-10-16 DIAGNOSIS — Z79899 Other long term (current) drug therapy: Secondary | ICD-10-CM

## 2022-10-16 DIAGNOSIS — F03A0 Unspecified dementia, mild, without behavioral disturbance, psychotic disturbance, mood disturbance, and anxiety: Secondary | ICD-10-CM | POA: Diagnosis present

## 2022-10-16 DIAGNOSIS — D649 Anemia, unspecified: Secondary | ICD-10-CM | POA: Diagnosis not present

## 2022-10-16 DIAGNOSIS — F32A Depression, unspecified: Secondary | ICD-10-CM | POA: Diagnosis present

## 2022-10-16 DIAGNOSIS — Z452 Encounter for adjustment and management of vascular access device: Secondary | ICD-10-CM | POA: Diagnosis not present

## 2022-10-16 DIAGNOSIS — Z9842 Cataract extraction status, left eye: Secondary | ICD-10-CM | POA: Diagnosis not present

## 2022-10-16 DIAGNOSIS — E871 Hypo-osmolality and hyponatremia: Secondary | ICD-10-CM | POA: Diagnosis present

## 2022-10-16 DIAGNOSIS — E7800 Pure hypercholesterolemia, unspecified: Secondary | ICD-10-CM | POA: Diagnosis present

## 2022-10-16 DIAGNOSIS — N2889 Other specified disorders of kidney and ureter: Secondary | ICD-10-CM

## 2022-10-16 DIAGNOSIS — R195 Other fecal abnormalities: Secondary | ICD-10-CM | POA: Diagnosis not present

## 2022-10-16 DIAGNOSIS — I509 Heart failure, unspecified: Secondary | ICD-10-CM

## 2022-10-16 DIAGNOSIS — A084 Viral intestinal infection, unspecified: Secondary | ICD-10-CM | POA: Diagnosis not present

## 2022-10-16 LAB — COMPREHENSIVE METABOLIC PANEL
ALT: 24 U/L (ref 0–44)
AST: 27 U/L (ref 15–41)
Albumin: 3.9 g/dL (ref 3.5–5.0)
Alkaline Phosphatase: 72 U/L (ref 38–126)
Anion gap: 12 (ref 5–15)
BUN: 46 mg/dL — ABNORMAL HIGH (ref 8–23)
CO2: 20 mmol/L — ABNORMAL LOW (ref 22–32)
Calcium: 8.8 mg/dL — ABNORMAL LOW (ref 8.9–10.3)
Chloride: 101 mmol/L (ref 98–111)
Creatinine, Ser: 1.13 mg/dL — ABNORMAL HIGH (ref 0.44–1.00)
GFR, Estimated: 51 mL/min — ABNORMAL LOW (ref >60)
Glucose, Bld: 256 mg/dL — ABNORMAL HIGH (ref 70–99)
Potassium: 3.8 mmol/L (ref 3.5–5.1)
Sodium: 133 mmol/L — ABNORMAL LOW (ref 135–145)
Total Bilirubin: 0.8 mg/dL (ref 0.3–1.2)
Total Protein: 7.1 g/dL (ref 6.5–8.1)

## 2022-10-16 LAB — CBC WITH DIFFERENTIAL/PLATELET
Abs Immature Granulocytes: 0.04 10*3/uL (ref 0.00–0.07)
Basophils Absolute: 0 10*3/uL (ref 0.0–0.1)
Basophils Relative: 0 %
Eosinophils Absolute: 0 10*3/uL (ref 0.0–0.5)
Eosinophils Relative: 0 %
HCT: 42.6 % (ref 36.0–46.0)
Hemoglobin: 14.3 g/dL (ref 12.0–15.0)
Immature Granulocytes: 0 %
Lymphocytes Relative: 8 %
Lymphs Abs: 0.9 10*3/uL (ref 0.7–4.0)
MCH: 32.7 pg (ref 26.0–34.0)
MCHC: 33.6 g/dL (ref 30.0–36.0)
MCV: 97.5 fL (ref 80.0–100.0)
Monocytes Absolute: 0.9 10*3/uL (ref 0.1–1.0)
Monocytes Relative: 8 %
Neutro Abs: 9.1 10*3/uL — ABNORMAL HIGH (ref 1.7–7.7)
Neutrophils Relative %: 84 %
Platelets: 313 10*3/uL (ref 150–400)
RBC: 4.37 MIL/uL (ref 3.87–5.11)
RDW: 13 % (ref 11.5–15.5)
WBC: 11 10*3/uL — ABNORMAL HIGH (ref 4.0–10.5)
nRBC: 0 % (ref 0.0–0.2)

## 2022-10-16 LAB — LIPASE, BLOOD: Lipase: 20 U/L (ref 11–51)

## 2022-10-16 LAB — GLUCOSE, CAPILLARY: Glucose-Capillary: 264 mg/dL — ABNORMAL HIGH (ref 70–99)

## 2022-10-16 LAB — MAGNESIUM: Magnesium: 1.7 mg/dL (ref 1.7–2.4)

## 2022-10-16 MED ORDER — INSULIN ASPART 100 UNIT/ML IJ SOLN
0.0000 [IU] | Freq: Every day | INTRAMUSCULAR | Status: DC
  Administered 2022-10-16: 3 [IU] via SUBCUTANEOUS
  Administered 2022-10-18 – 2022-10-20 (×2): 2 [IU] via SUBCUTANEOUS

## 2022-10-16 MED ORDER — INSULIN ASPART 100 UNIT/ML IJ SOLN
0.0000 [IU] | Freq: Three times a day (TID) | INTRAMUSCULAR | Status: DC
  Administered 2022-10-17: 5 [IU] via SUBCUTANEOUS
  Administered 2022-10-17 (×2): 3 [IU] via SUBCUTANEOUS
  Administered 2022-10-18 (×3): 5 [IU] via SUBCUTANEOUS
  Administered 2022-10-19 (×2): 2 [IU] via SUBCUTANEOUS
  Administered 2022-10-19: 3 [IU] via SUBCUTANEOUS
  Administered 2022-10-20 (×2): 2 [IU] via SUBCUTANEOUS
  Administered 2022-10-20: 3 [IU] via SUBCUTANEOUS
  Administered 2022-10-21: 5 [IU] via SUBCUTANEOUS
  Administered 2022-10-21 – 2022-10-22 (×4): 3 [IU] via SUBCUTANEOUS

## 2022-10-16 MED ORDER — PROMETHAZINE HCL 25 MG PO TABS
12.5000 mg | ORAL_TABLET | Freq: Four times a day (QID) | ORAL | Status: DC | PRN
  Administered 2022-10-17 – 2022-10-19 (×2): 12.5 mg via ORAL
  Filled 2022-10-16 (×3): qty 1

## 2022-10-16 MED ORDER — DICYCLOMINE HCL 10 MG/ML IM SOLN
20.0000 mg | Freq: Once | INTRAMUSCULAR | Status: AC
  Administered 2022-10-16: 20 mg via INTRAMUSCULAR
  Filled 2022-10-16: qty 2

## 2022-10-16 MED ORDER — LACTATED RINGERS IV SOLN: INTRAVENOUS | Status: DC

## 2022-10-16 MED ORDER — SODIUM CHLORIDE 0.9 % IV BOLUS
1000.0000 mL | Freq: Once | INTRAVENOUS | Status: AC
  Administered 2022-10-16: 1000 mL via INTRAVENOUS

## 2022-10-16 MED ORDER — LACTATED RINGERS IV SOLN: INTRAVENOUS | Status: AC

## 2022-10-16 MED ORDER — SODIUM CHLORIDE 0.9 % IV SOLN
12.5000 mg | Freq: Four times a day (QID) | INTRAVENOUS | Status: DC | PRN
  Administered 2022-10-16 – 2022-10-18 (×3): 12.5 mg via INTRAVENOUS
  Filled 2022-10-16: qty 0.5
  Filled 2022-10-16 (×3): qty 12.5

## 2022-10-16 MED ORDER — ENOXAPARIN SODIUM 40 MG/0.4ML IJ SOSY
40.0000 mg | PREFILLED_SYRINGE | INTRAMUSCULAR | Status: DC
  Administered 2022-10-16 – 2022-10-18 (×3): 40 mg via SUBCUTANEOUS
  Filled 2022-10-16 (×3): qty 0.4

## 2022-10-16 NOTE — H&P (Signed)
History and Physical    Patient: Michaela Santana ZOX:[id number] DOB: 1945/07/13 DOA: 10/16/2022 DOS: the patient was seen and examined on 10/16/2022 PCP: Shon Hale, MD  Patient coming from: Home  Chief Complaint:  Chief Complaint  Patient presents with   Abdominal Pain   HPI: Michaela Santana is a 77 y.o. female with medical history significant of the [place], hypertension, hyperlipidemia, history of kidney stones, depression, GERD, mild dementia who was brought in by her daughter secondary to abdominal pain with nausea and vomiting.  Also some diarrhea.  Apparently many family members were sick in the house.  They all thought they had a stomach bug.  This happened over the past week.  Patient however has remained sick.  She continues to have symptoms including nausea vomiting.  Patient has refused to eat or drink.  She was seen in the ER.  Still having some vomiting.  Patient appears dehydrated with AKI and hyponatremia.  She was given fluid boluses but was unable to walk or stand on her feet.  She is being admitted to the hospital for observation and for symptom control.  Review of Systems: As mentioned in the history of present illness. All other systems reviewed and are negative. Past Medical History:  Diagnosis Date   Allergic rhinitis    Anemia    Arthritis    "probably in hands" (07/17/2016)   CKD (chronic kidney disease), stage III (HCC)    Depression    Esophageal reflux    Family history of adverse reaction to anesthesia    "her father had some issues when he had his gallbladder taken out when he was in his late 92s"   GERD (gastroesophageal reflux disease)    Glaucoma    open angle, bilateral   High cholesterol    History of kidney stones    Hypercholesterolemia    Hypertension    Kidney cysts    Lumbago with sciatica, right side    Sepsis (HCC) 07/2016   d/t flu   Type II diabetes mellitus (HCC)    Vitamin D deficiency    Past Surgical History:  Procedure  Laterality Date   BREAST BIOPSY  1990   "? side; it wasn't anything"   BRONCHIAL BIOPSY  08/01/2021   Procedure: BRONCHIAL BIOPSIES;  Surgeon: Josephine Igo, DO;  Location: [facility];  Service: Pulmonary;;   BRONCHIAL NEEDLE ASPIRATION BIOPSY  08/01/2021   Procedure: BRONCHIAL NEEDLE ASPIRATION BIOPSIES;  Surgeon: Josephine Igo, DO;  Location: [facility];  Service: Pulmonary;;   CATARACT EXTRACTION, BILATERAL  2021   CHOLECYSTECTOMY OPEN  1971   COLONOSCOPY     2001,2011,2023   TUBAL LIGATION  1978   VIDEO BRONCHOSCOPY WITH RADIAL ENDOBRONCHIAL ULTRASOUND  08/01/2021   Procedure: RADIAL ENDOBRONCHIAL ULTRASOUND;  Surgeon: Josephine Igo, DO;  Location: [facility];  Service: Pulmonary;;   Social History:  reports that she has never smoked. She has never used smokeless tobacco. She reports that she does not drink alcohol and does not use drugs.  Allergies  Allergen Reactions   Codeine Hypertension   Alprazolam Other (See Comments)    lethargic    Family History  Problem Relation Age of Onset   Dementia Mother    Diabetes Mother    Stroke Father    Hypertension Father    CVA Father    Diabetes Sister    Hypertension Sister    Heart disease Paternal Grandfather    Breast cancer Neg Hx  Prior to Admission medications   Medication Sig Start Date End Date Taking? Authorizing Provider  amLODipine (NORVASC) 5 MG tablet Take 5 mg by mouth daily. 01/20/20  Yes [provider]  acetaminophen (TYLENOL) 650 MG CR tablet Take 650 mg by mouth every 8 (eight) hours as needed for pain.    [provider]  albuterol (VENTOLIN HFA) 108 (90 Base) MCG/ACT inhaler Inhale into the lungs every 6 (six) hours as needed for wheezing or shortness of breath.    [provider]  Artificial Tear Solution (SYSTANE CONTACTS OP) Apply 0.4 drops to eye as needed.    [provider]  Blood Glucose Monitoring Suppl (FREESTYLE LITE) DEVI USE UTD 07/26/16    [provider]  calcium carbonate (TUMS - DOSED IN MG ELEMENTAL CALCIUM) 500 MG chewable tablet Chew 1 tablet by mouth daily.    [provider]  DULoxetine HCl 40 MG CPEP Take 40 mg by mouth daily. 06/29/21   [provider]  famotidine (PEPCID) 20 MG tablet Take 20 mg by mouth daily. Patient not taking: Reported on 05/23/2022    [provider]  ferrous sulfate 325 (65 FE) MG EC tablet Take 325 mg by mouth daily.    [provider]  FREESTYLE LITE test strip USE TO CHECK FASTING GLUCOSE IN THE MORNING AND 2 HOURS AFTER DINNER OR LUNCH 07/26/16   [provider]  gabapentin (NEURONTIN) 300 MG capsule Take 300 mg by mouth every evening. 01/01/20   [provider]  glipiZIDE (GLUCOTROL XL) 5 MG 24 hr tablet Take 5 mg by mouth daily with breakfast. 08/13/16   [provider]  Lancets (FREESTYLE) lancets  07/26/16   [provider]  loratadine (CLARITIN) 10 MG tablet Take 10 mg by mouth daily as needed for allergies.    [provider]  LORazepam (ATIVAN) 0.5 MG tablet Take 0.5 mg by mouth every 8 (eight) hours.    [provider]  losartan (COZAAR) 100 MG tablet Take 100 mg by mouth daily. 06/21/21   [provider]  magnesium oxide (MAG-OX) 400 (240 Mg) MG tablet Take 800 mg by mouth 2 (two) times daily. 06/23/21   [provider]  ondansetron (ZOFRAN) 8 MG tablet Take 8 mg by mouth every 8 (eight) hours as needed for nausea/vomiting. 09/14/21   [provider]  oxymetazoline (AFRIN) 0.05 % nasal spray Place 1 spray into both nostrils 2 (two) times daily.    [provider]  Propylene Glycol (SYSTANE BALANCE) 0.6 % SOLN Place 1 drop into both eyes daily as needed (dry eyes).    [provider]  QUEtiapine (SEROQUEL) 25 MG tablet Take 25 mg by mouth at bedtime.    [provider]  vitamin B-12 (CYANOCOBALAMIN) 1000 MCG tablet Take 1 tablet (1,000 mcg total)  by mouth daily. 10/29/21   Dimple Nanas, MD    Physical Exam: Vitals:   10/16/22 1335 10/16/22 1337 10/16/22 1338 10/16/22 1709  BP:  (!) 163/96  (!) 166/107  Pulse:  68  90  Resp:  18  18  Temp:  97.7 F (36.5 C)  98 F (36.7 C)  TempSrc:  Oral  Oral  SpO2: 100% 100%  99%  Weight:   83.8 kg   Height:   5\' 1"  (1.549 m)    Constitutional: Acutely ill looking, NAD, calm, comfortable Eyes: PERRL, lids and conjunctivae normal ENMT: Mucous membranes are dry. Posterior pharynx clear of any exudate or lesions.Normal  dentition.  Neck: normal, supple, no masses, no thyromegaly Respiratory: clear to auscultation bilaterally, no wheezing, no crackles. Normal respiratory effort. No accessory muscle use.  Cardiovascular: Regular rate and rhythm, no murmurs / rubs / gallops. No extremity edema. 2+ pedal pulses. No carotid bruits.  Abdomen: no tenderness, no masses palpated. No hepatosplenomegaly. Bowel sounds positive.  Musculoskeletal: Good range of motion, no joint swelling or tenderness, Skin: no rashes, lesions, ulcers. No induration Neurologic: CN 2-12 grossly intact. Sensation intact, DTR normal. Strength 5/5 in all 4.  Psychiatric: Withdrawn, not agitated  Data Reviewed:  Glucose 256, white count 11,000, sodium 133, CO2 20, BUN 46 creatinine 1.13.  Assessment and Plan:  #1 intractable nausea with vomiting: Most likely acute gastroenteritis.  Patient is still symptomatic.  Will admit the patient for observation.  IV fluids.  Nausea control.  Patient has been admitted history of QT prolongation.  Follow EKG.  Once patient is hemodynamically stable will be discharged home.  #2 diabetes: Non-insulin-dependent.  Sliding scale insulin.  #3 dehydration: We will hydrate patient and monitor closely.  #4 leukocytosis: Most likely due to dehydration.  Hydrate and monitor white count.  #5 AKI: Most likely prerenal.  Hydrate and monitor    Advance Care Planning:   Code Status: Full  Code   Consults: None  Family Communication: Daughter at bedside  Severity of Illness: The appropriate patient status for this patient is OBSERVATION. Observation status is judged to be reasonable and necessary in order to provide the required intensity of service to ensure the patient's safety. The patient's presenting symptoms, physical exam findings, and initial radiographic and laboratory data in the context of their medical condition is felt to place them at decreased risk for further clinical deterioration. Furthermore, it is anticipated that the patient will be medically stable for discharge from the hospital within 2 midnights of admission.   AuthorLonia Blood, MD 10/16/2022 8:01 PM  For on call review [email or web address].

## 2022-10-16 NOTE — ED Notes (Signed)
ED TO INPATIENT HANDOFF REPORT  Name/Age/Gender Michaela Santana 77 y.o. female  Code Status Code Status History     Date Active Date Inactive Code Status Order ID Comments User Context   10/26/2021 2258 10/29/2021 2058 Full Code [id number]  Briscoe Deutscher, MD Inpatient   07/17/2016 1635 07/21/2016 1647 Full Code [id number]  [facility]       Home/SNF/Other Home  Chief Complaint Nausea & vomiting [R11.2]  Level of Care/Admitting Diagnosis ED Disposition     ED Disposition  Admit   Condition  --   Comment  Hospital Area: [facility] [[id number]]  Level of Care: Med-Surg [16]  May place patient in observation at [facility] or [facility] if equivalent level of care is available:: Yes  Covid Evaluation: Asymptomatic - no recent exposure (last 10 days) testing not required  Diagnosis: Nausea & vomiting [277057]  Admitting Physician: Rometta Emery [[id number]]  Attending Physician: Rometta Emery [[id number]]          Medical History Past Medical History:  Diagnosis Date   Allergic rhinitis    Anemia    Arthritis    "probably in hands" (07/17/2016)   CKD (chronic kidney disease), stage III (HCC)    Depression    Esophageal reflux    Family history of adverse reaction to anesthesia    "her father had some issues when he had his gallbladder taken out when he was in his late 44s"   GERD (gastroesophageal reflux disease)    Glaucoma    open angle, bilateral   High cholesterol    History of kidney stones    Hypercholesterolemia    Hypertension    Kidney cysts    Lumbago with sciatica, right side    Sepsis (HCC) 07/2016   d/t flu   Type II diabetes mellitus (HCC)    Vitamin D deficiency     Allergies Allergies  Allergen Reactions   Codeine Hypertension   Alprazolam Other (See Comments)    lethargic    IV Location/Drains/Wounds Patient Lines/Drains/Airways Status     Active Line/Drains/Airways     Name Placement date Placement time  Site Days   Peripheral IV 10/16/22 20 G 2.5" Right Antecubital 10/16/22  1616  Antecubital  less than 1   Wound / Incision (Open or Dehisced) 08/23/21 Puncture Back Right;Upper;Posterior Lung Biopsy 08/23/21  1109  Back  419            Labs/Imaging Results for orders placed or performed during the hospital encounter of 10/16/22 (from the past 48 hour(s))  Comprehensive metabolic panel     Status: Abnormal   Collection Time: 10/16/22  2:26 PM  Result Value Ref Range   Sodium 133 (L) 135 - 145 mmol/L   Potassium 3.8 3.5 - 5.1 mmol/L   Chloride 101 98 - 111 mmol/L   CO2 20 (L) 22 - 32 mmol/L   Glucose, Bld 256 (H) 70 - 99 mg/dL    Comment: Glucose reference range applies only to samples taken after fasting for at least 8 hours.   BUN 46 (H) 8 - 23 mg/dL   Creatinine, Ser 0.86 (H) 0.44 - 1.00 mg/dL   Calcium 8.8 (L) 8.9 - 10.3 mg/dL   Total Protein 7.1 6.5 - 8.1 g/dL   Albumin 3.9 3.5 - 5.0 g/dL   AST 27 15 - 41 U/L   ALT 24 0 - 44 U/L   Alkaline Phosphatase 72 38 - 126  U/L   Total Bilirubin 0.8 0.3 - 1.2 mg/dL   GFR, Estimated 51 (L) >60 mL/min    Comment: (NOTE) Calculated using the CKD-EPI Creatinine Equation (2021)    Anion gap 12 5 - 15    Comment: Performed at [facility], [address], [place], [place] [address]  Lipase, blood     Status: None   Collection Time: 10/16/22  2:26 PM  Result Value Ref Range   Lipase 20 11 - 51 U/L    Comment: Performed at [facility], [address] [place], [place] [address]  Magnesium     Status: None   Collection Time: 10/16/22  2:26 PM  Result Value Ref Range   Magnesium 1.7 1.7 - 2.4 mg/dL    Comment: Performed at [facility], [address], [place], [place] [address]  CBC with Differential/Platelet     Status: Abnormal   Collection Time: 10/16/22  4:20 PM  Result Value Ref Range   WBC 11.0 (H) 4.0 - 10.5 K/uL   RBC 4.37 3.87 - 5.11 MIL/uL   Hemoglobin 14.3 12.0 -  15.0 g/dL   HCT 62.9 52.8 - 41.3 %   MCV 97.5 80.0 - 100.0 fL   MCH 32.7 26.0 - 34.0 pg   MCHC 33.6 30.0 - 36.0 g/dL   RDW 24.4 01.0 - 27.2 %   Platelets 313 150 - 400 K/uL   nRBC 0.0 0.0 - 0.2 %   Neutrophils Relative % 84 %   Neutro Abs 9.1 (H) 1.7 - 7.7 K/uL   Lymphocytes Relative 8 %   Lymphs Abs 0.9 0.7 - 4.0 K/uL   Monocytes Relative 8 %   Monocytes Absolute 0.9 0.1 - 1.0 K/uL   Eosinophils Relative 0 %   Eosinophils Absolute 0.0 0.0 - 0.5 K/uL   Basophils Relative 0 %   Basophils Absolute 0.0 0.0 - 0.1 K/uL   Immature Granulocytes 0 %   Abs Immature Granulocytes 0.04 0.00 - 0.07 K/uL    Comment: Performed at [facility], [address] [place], [place] [address]   No results found.  Pending Labs Unresulted Labs (From admission, onward)     Start     Ordered   10/16/22 1358  CBC with Differential  Once,   STAT        10/16/22 1357            Vitals/Pain Today's Vitals   10/16/22 1335 10/16/22 1337 10/16/22 1338 10/16/22 1709  BP:  (!) 163/96  (!) 166/107  Pulse:  68  90  Resp:  18  18  Temp:  97.7 F (36.5 C)  98 F (36.7 C)  TempSrc:  Oral  Oral  SpO2: 100% 100%  99%  Weight:   184 lb 11.9 oz (83.8 kg)   Height:   5\' 1"  (1.549 m)   PainSc:    0-No pain    Isolation Precautions No active isolations  Medications Medications  promethazine (PHENERGAN) 12.5 mg in sodium chloride 0.9 % 50 mL IVPB (0 mg Intravenous Stopped 10/16/22 1710)  lactated ringers infusion (has no administration in time range)  sodium chloride 0.9 % bolus 1,000 mL (0 mLs Intravenous Stopped 10/16/22 1900)  dicyclomine (BENTYL) injection 20 mg (20 mg Intramuscular Given 10/16/22 1425)    Mobility walks with person assist

## 2022-10-16 NOTE — ED Provider Notes (Signed)
  Physical Exam  BP (!) 163/96 (BP Location: Left Arm)   Pulse 68   Temp 97.7 F (36.5 C) (Oral)   Resp 18   Ht 5\' 1"  (1.549 m)   Wt 83.8 kg   SpO2 100%   BMI 34.91 kg/m   Physical Exam  Procedures  Procedures  ED Course / MDM    Medical Decision Making Amount and/or Complexity of Data Reviewed Labs: ordered.  Risk Prescription drug management. Decision regarding hospitalization.   Cc: n/v/d. Hx of dementia. VSS and WNL. Abd exam is reassuring. Family is sick.  Eval for dehydration, aki, elyte disturbance.  5:05 PM Results of the ER workup discussed with the patient.  She has BUN/creatinine ratio over 20.  P.o. challenge has been initiated.  Dispo per results of p.o. challenge.  6:49 PM Pt has received iv fluids and promethazine. Family having hard time with patient cooperating with po challenge. Pt reassessed on 3  separate occasion by me. Initially I thought it was phenergan side effects, but with time, she has remained sleepy. She has received ivf. Daughter states that pt has been sleeping a whole lot at home through illness. At reassessment #2 I put water at the bedside and daughter has been pushing her to drink. Pt still sleepy. She would like to go home, but family wants to make sure she is not going to get sicker. She was just reassessed and she has hardly been able to drink fluid.   High risk  for deterioration. Will admit for obs, dehydration.     Derwood Kaplan, MD 10/16/22 [phone]

## 2022-10-16 NOTE — ED Provider Notes (Signed)
[place] EMERGENCY DEPARTMENT AT [facility] Provider Note   CSN: [id number] Arrival date & time: 10/16/22  1330     History {Add pertinent medical, surgical, social history, OB history to HPI:1} Chief Complaint  Patient presents with   Abdominal Pain    Michaela Santana is a 77 y.o. female with history of dementia presenting from home accompanied by daughter with concern for nausea, vomiting and diarrhea.  Her daughter provides supplemental history.  She reports that many family contacts in the house have had a "stomach bug" with nausea vomiting and diarrhea over the past week.  The patient herself became sick about 3 days ago.  She is having persistent vomiting stools.  She would not drink any water today.  The family is concerned because the patient honestly hydrated, likely diffuse week and he feels he is getting dehydrated.  The patient is level 5 caveat for dementia  HPI     Home Medications Prior to Admission medications   Medication Sig Start Date End Date Taking? Authorizing Provider  acetaminophen (TYLENOL) 650 MG CR tablet Take 650 mg by mouth every 8 (eight) hours as needed for pain.    [provider]  albuterol (VENTOLIN HFA) 108 (90 Base) MCG/ACT inhaler Inhale into the lungs every 6 (six) hours as needed for wheezing or shortness of breath.    [provider]  amLODipine (NORVASC) 5 MG tablet Take 5 mg by mouth daily. 01/20/20   [provider]  Artificial Tear Solution (SYSTANE CONTACTS OP) Apply 0.4 drops to eye as needed.    [provider]  Blood Glucose Monitoring Suppl (FREESTYLE LITE) DEVI USE UTD 07/26/16   [provider]  calcium carbonate (TUMS - DOSED IN MG ELEMENTAL CALCIUM) 500 MG chewable tablet Chew 1 tablet by mouth daily.    [provider]  DULoxetine HCl 40 MG CPEP Take 40 mg by mouth daily. 06/29/21   [provider]  famotidine (PEPCID) 20 MG tablet Take 20 mg by mouth  daily. Patient not taking: Reported on 05/23/2022    [provider]  ferrous sulfate 325 (65 FE) MG EC tablet Take 325 mg by mouth daily.    [provider]  FREESTYLE LITE test strip USE TO CHECK FASTING GLUCOSE IN THE MORNING AND 2 HOURS AFTER DINNER OR LUNCH 07/26/16   [provider]  gabapentin (NEURONTIN) 300 MG capsule Take 300 mg by mouth every evening. 01/01/20   [provider]  glipiZIDE (GLUCOTROL XL) 5 MG 24 hr tablet Take 5 mg by mouth daily with breakfast. 08/13/16   [provider]  Lancets (FREESTYLE) lancets  07/26/16   [provider]  loratadine (CLARITIN) 10 MG tablet Take 10 mg by mouth daily as needed for allergies.    [provider]  LORazepam (ATIVAN) 0.5 MG tablet Take 0.5 mg by mouth every 8 (eight) hours.    [provider]  losartan (COZAAR) 100 MG tablet Take 100 mg by mouth daily. 06/21/21   [provider]  magnesium oxide (MAG-OX) 400 (240 Mg) MG tablet Take 800 mg by mouth 2 (two) times daily. 06/23/21   [provider]  ondansetron (ZOFRAN) 8 MG tablet Take 8 mg by mouth every 8 (eight) hours as needed for nausea/vomiting. 09/14/21   [provider]  oxymetazoline (AFRIN) 0.05 % nasal spray Place 1 spray into both nostrils 2 (two) times daily.    [provider]  Propylene Glycol (SYSTANE BALANCE) 0.6 %  SOLN Place 1 drop into both eyes daily as needed (dry eyes).    [provider]  QUEtiapine (SEROQUEL) 25 MG tablet Take 25 mg by mouth at bedtime.    [provider]  vitamin B-12 (CYANOCOBALAMIN) 1000 MCG tablet Take 1 tablet (1,000 mcg total) by mouth daily. 10/29/21   Dimple Nanas, MD      Allergies    Codeine and Alprazolam    Review of Systems   Review of Systems  Physical Exam Updated Vital Signs BP (!) 163/96 (BP Location: Left Arm)   Pulse 68   Temp 97.7 F (36.5 C) (Oral)   Resp 18   Ht 5\' 1"  (1.549 m)   Wt 83.8 kg    SpO2 100%   BMI 34.91 kg/m  Physical Exam Constitutional:      General: She is not in acute distress. HENT:     Head: Normocephalic and atraumatic.  Eyes:     Conjunctiva/sclera: Conjunctivae normal.     Pupils: Pupils are equal, round, and reactive to light.  Cardiovascular:     Rate and Rhythm: Normal rate and regular rhythm.  Pulmonary:     Effort: Pulmonary effort is normal. No respiratory distress.  Abdominal:     General: There is no distension.     Tenderness: There is generalized abdominal tenderness.  Skin:    General: Skin is warm and dry.  Neurological:     General: No focal deficit present.     Mental Status: She is alert. Mental status is at baseline.  Psychiatric:        Mood and Affect: Mood normal.        Behavior: Behavior normal.     ED Results / Procedures / Treatments   Labs (all labs ordered are listed, but only abnormal results are displayed) Labs Reviewed - No data to display  EKG None  Radiology No results found.  Procedures Procedures  {Document cardiac monitor, telemetry assessment procedure when appropriate:1}  Medications Ordered in ED Medications  sodium chloride 0.9 % bolus 1,000 mL (has no administration in time range)  promethazine (PHENERGAN) 12.5 mg in sodium chloride 0.9 % 50 mL IVPB (has no administration in time range)    ED Course/ Medical Decision Making/ A&P   {   Click here for ABCD2, HEART and other calculatorsREFRESH Note before signing :1}                          Medical Decision Making Amount and/or Complexity of Data Reviewed Labs: ordered.   This patient presents to the ED with concern for nausea, vomiting diarrhea. This involves an extensive number of treatment options, and is a complaint that carries with it a high risk of complications and morbidity.  The differential diagnosis includes gastroenteritis most likely, given sick contacts in the family, versus foodborne illness tenosynovitis, versus  other  Co-morbidities that complicate the patient evaluation: dementia higher risk of dehydration  Additional history obtained from patient's daughter at bedside  External records from outside source obtained and reviewed including ***  I ordered and personally interpreted labs.  The pertinent results include:  ***  I ordered imaging studies including *** I independently visualized and interpreted imaging which showed *** I agree with the radiologist interpretation  The patient was maintained on a cardiac monitor.  I personally viewed and interpreted the cardiac monitored which showed an underlying rhythm of: ***  I ordered medication including IV fluids,  IM bentyl, IV phenergan   I have reviewed the patients home medicines and have made adjustments as needed  Test Considered: ***  I requested consultation with the ***,  and discussed lab and imaging findings as well as pertinent plan - they recommend: ***  After the interventions noted above, I reevaluated the patient and found that they have: {resolved/improved/worsened:23923::"improved"}  Social Determinants of Health:***  Dispostion:  After consideration of the diagnostic results and the patients response to treatment, I feel that the patent would benefit from ***.   {Document critical care time when appropriate:1} {Document review of labs and clinical decision tools ie heart score, Chads2Vasc2 etc:1}  {Document your independent review of radiology images, and any outside records:1} {Document your discussion with family members, caretakers, and with consultants:1} {Document social determinants of health affecting pt's care:1} {Document your decision making why or why not admission, treatments were needed:1} Final Clinical Impression(s) / ED Diagnoses Final diagnoses:  None    Rx / DC Orders ED Discharge Orders     None

## 2022-10-16 NOTE — ED Triage Notes (Addendum)
Per patient family patient has had abdominal pain and decreased appetite. Per EMS patient has verbalized no pain just states she does not feel good. Patient has dx of dementia. Family told EMS recently members of household has a stomach bug.

## 2022-10-17 ENCOUNTER — Observation Stay (HOSPITAL_COMMUNITY): Payer: Medicare Other

## 2022-10-17 DIAGNOSIS — R112 Nausea with vomiting, unspecified: Secondary | ICD-10-CM | POA: Diagnosis not present

## 2022-10-17 DIAGNOSIS — D72829 Elevated white blood cell count, unspecified: Secondary | ICD-10-CM | POA: Diagnosis not present

## 2022-10-17 DIAGNOSIS — E871 Hypo-osmolality and hyponatremia: Secondary | ICD-10-CM | POA: Diagnosis not present

## 2022-10-17 DIAGNOSIS — K6389 Other specified diseases of intestine: Secondary | ICD-10-CM | POA: Diagnosis not present

## 2022-10-17 DIAGNOSIS — I700 Atherosclerosis of aorta: Secondary | ICD-10-CM | POA: Diagnosis not present

## 2022-10-17 DIAGNOSIS — R918 Other nonspecific abnormal finding of lung field: Secondary | ICD-10-CM

## 2022-10-17 LAB — COMPREHENSIVE METABOLIC PANEL
ALT: 19 U/L (ref 0–44)
AST: 17 U/L (ref 15–41)
Albumin: 3 g/dL — ABNORMAL LOW (ref 3.5–5.0)
Alkaline Phosphatase: 54 U/L (ref 38–126)
Anion gap: 10 (ref 5–15)
BUN: 40 mg/dL — ABNORMAL HIGH (ref 8–23)
CO2: 19 mmol/L — ABNORMAL LOW (ref 22–32)
Calcium: 7.9 mg/dL — ABNORMAL LOW (ref 8.9–10.3)
Chloride: 106 mmol/L (ref 98–111)
Creatinine, Ser: 0.98 mg/dL (ref 0.44–1.00)
GFR, Estimated: >60 mL/min (ref >60)
Glucose, Bld: 238 mg/dL — ABNORMAL HIGH (ref 70–99)
Potassium: 3.1 mmol/L — ABNORMAL LOW (ref 3.5–5.1)
Sodium: 135 mmol/L (ref 135–145)
Total Bilirubin: 0.7 mg/dL (ref 0.3–1.2)
Total Protein: 5.5 g/dL — ABNORMAL LOW (ref 6.5–8.1)

## 2022-10-17 LAB — HEMOGLOBIN AND HEMATOCRIT, BLOOD
HCT: 44.1 % (ref 36.0–46.0)
Hemoglobin: 14.7 g/dL (ref 12.0–15.0)

## 2022-10-17 LAB — HEMOGLOBIN A1C
Hgb A1c MFr Bld: 6.6 % — ABNORMAL HIGH (ref 4.8–5.6)
Mean Plasma Glucose: 142.72 mg/dL

## 2022-10-17 LAB — GLUCOSE, CAPILLARY
Glucose-Capillary: 247 mg/dL — ABNORMAL HIGH (ref 70–99)
Glucose-Capillary: 187 mg/dL — ABNORMAL HIGH (ref 70–99)
Glucose-Capillary: 178 mg/dL — ABNORMAL HIGH (ref 70–99)
Glucose-Capillary: 197 mg/dL — ABNORMAL HIGH (ref 70–99)

## 2022-10-17 LAB — CBC
HCT: 43.8 % (ref 36.0–46.0)
Hemoglobin: 14.4 g/dL (ref 12.0–15.0)
MCH: 32.8 pg (ref 26.0–34.0)
MCHC: 32.9 g/dL (ref 30.0–36.0)
MCV: 99.8 fL (ref 80.0–100.0)
Platelets: 328 10*3/uL (ref 150–400)
RBC: 4.39 MIL/uL (ref 3.87–5.11)
RDW: 13.1 % (ref 11.5–15.5)
WBC: 14.3 10*3/uL — ABNORMAL HIGH (ref 4.0–10.5)
nRBC: 0 % (ref 0.0–0.2)

## 2022-10-17 LAB — MAGNESIUM: Magnesium: 1.5 mg/dL — ABNORMAL LOW (ref 1.7–2.4)

## 2022-10-17 MED ORDER — IOHEXOL 350 MG/ML SOLN
100.0000 mL | Freq: Once | INTRAVENOUS | Status: AC | PRN
  Administered 2022-10-17: 100 mL via INTRAVENOUS

## 2022-10-17 MED ORDER — MAGNESIUM SULFATE 2 GM/50ML IV SOLN
2.0000 g | Freq: Once | INTRAVENOUS | Status: AC
  Administered 2022-10-17: 2 g via INTRAVENOUS
  Filled 2022-10-17: qty 50

## 2022-10-17 MED ORDER — POTASSIUM CHLORIDE 10 MEQ/100ML IV SOLN
10.0000 meq | INTRAVENOUS | Status: AC
  Administered 2022-10-17 (×3): 10 meq via INTRAVENOUS
  Filled 2022-10-17 (×3): qty 100

## 2022-10-17 MED ORDER — QUETIAPINE FUMARATE 25 MG PO TABS
12.5000 mg | ORAL_TABLET | ORAL | Status: AC
  Administered 2022-10-17: 12.5 mg via ORAL
  Filled 2022-10-17: qty 1

## 2022-10-17 NOTE — TOC CM/SW Note (Signed)
Transition of Care [facility]) Screening Note  Patient Details  Name: Michaela Santana Date of Birth: Jun 07, 1946  Transition of Care [facility] CM/SW Contact:    Ewing Schlein, LCSW Phone Number: 10/17/2022, 10:06 AM  Transition of Care Department [facility]) has reviewed patient and no TOC needs have been identified at this time. We will continue to monitor patient advancement through interdisciplinary progression rounds. If new patient transition needs arise, please place a TOC consult.

## 2022-10-17 NOTE — Progress Notes (Signed)
[facility]  PROGRESS NOTE  Michaela Santana GNF:[id number] DOB: 1946/01/03 DOA: 10/16/2022 PCP: Shon Hale, MD   Brief HPI:   77 year old female with medical history of diabetes mellitus type 2, hypertension, hyperlipidemia, depression, GERD, mild dementia, history of kidney stone was brought to the hospital with abdominal pain, nausea and vomiting.  Patient also has been having diarrhea.  Apparently many family members were sick at home.  Patient started on IV fluids.    Subjective   Today patient had 1 episode of loose BM this morning, then had black-colored stool later today.   Assessment/Plan:    Diarrhea -Had black-colored stool today, hemoglobin has been stable -Concern for GI bleed -Will get CTA abdomen/pelvis -Will repeat H&H tonight -Check stool GI pathogen panel  Diabetes mellitus type 2 -Continue sliding scale insulin with NovoLog -CBG well-controlled  Acute kidney injury -Likely prerenal, resolved  Hypokalemia, hypomagnesemia -Replace and follow magnesium and potassium level in a.m.   Medications     enoxaparin (LOVENOX) injection  40 mg Subcutaneous Q24H   insulin aspart  0-15 Units Subcutaneous TID WC   insulin aspart  0-5 Units Subcutaneous QHS     Data Reviewed:   CBG:  Recent Labs  Lab 10/16/22 2157 10/17/22 0717 10/17/22 1135 10/17/22 1654  GLUCAP 264* 247* 187* 178*    SpO2: 100 %    Vitals:   10/17/22 0452 10/17/22 0839 10/17/22 1206 10/17/22 1307  BP: (!) 145/90 (!) 149/98 (!) 142/88 (!) 154/68  Pulse: 94 (!) 104 (!) 104 95  Resp: 18 17 17 17   Temp: 97.6 F (36.4 C) 97.7 F (36.5 C) 98.3 F (36.8 C) 97.9 F (36.6 C)  TempSrc: Oral Oral Oral Oral  SpO2: 97% 96% 99% 100%  Weight:      Height:          Data Reviewed:  Basic Metabolic Panel: Recent Labs  Lab 10/16/22 1426 10/17/22 0439  NA 133* 135  K 3.8 3.1*  CL 101 106  CO2 20* 19*  GLUCOSE 256* 238*  BUN 46* 40*  CREATININE 1.13* 0.98   CALCIUM 8.8* 7.9*  MG 1.7 1.5*    CBC: Recent Labs  Lab 10/16/22 1620 10/17/22 0439  WBC 11.0* 14.3*  NEUTROABS 9.1*  --   HGB 14.3 14.4  HCT 42.6 43.8  MCV 97.5 99.8  PLT 313 328    LFT Recent Labs  Lab 10/16/22 1426 10/17/22 0439  AST 27 17  ALT 24 19  ALKPHOS 72 54  BILITOT 0.8 0.7  PROT 7.1 5.5*  ALBUMIN 3.9 3.0*     Antibiotics: Anti-infectives (From admission, onward)    None        DVT prophylaxis: Lovenox  Code Status: Full code  Family Communication: Discussed with patient's daughters at bedside   CONSULTS    Objective    Physical Examination:   General-appears in no acute distress Heart-S1-S2, regular, no murmur auscultated Lungs-clear to auscultation bilaterally, no wheezing or crackles auscultated Abdomen-soft, nontender, no organomegaly Extremities-no edema in the lower extremities Neuro-alert, oriented x3, no focal deficit noted   Status is: Inpatient:             Meredeth Ide   [facility] If 7PM-7AM, please contact night-coverage at [email or web address], Office  [phone]   10/17/2022, 4:57 PM  LOS: 0 days

## 2022-10-18 DIAGNOSIS — R197 Diarrhea, unspecified: Secondary | ICD-10-CM | POA: Diagnosis present

## 2022-10-18 DIAGNOSIS — E871 Hypo-osmolality and hyponatremia: Secondary | ICD-10-CM | POA: Diagnosis present

## 2022-10-18 DIAGNOSIS — Z885 Allergy status to narcotic agent status: Secondary | ICD-10-CM | POA: Diagnosis not present

## 2022-10-18 DIAGNOSIS — A084 Viral intestinal infection, unspecified: Secondary | ICD-10-CM | POA: Diagnosis not present

## 2022-10-18 DIAGNOSIS — R339 Retention of urine, unspecified: Secondary | ICD-10-CM | POA: Diagnosis not present

## 2022-10-18 DIAGNOSIS — Z452 Encounter for adjustment and management of vascular access device: Secondary | ICD-10-CM | POA: Diagnosis not present

## 2022-10-18 DIAGNOSIS — E119 Type 2 diabetes mellitus without complications: Secondary | ICD-10-CM | POA: Diagnosis present

## 2022-10-18 DIAGNOSIS — R112 Nausea with vomiting, unspecified: Secondary | ICD-10-CM | POA: Diagnosis not present

## 2022-10-18 DIAGNOSIS — E860 Dehydration: Secondary | ICD-10-CM | POA: Diagnosis present

## 2022-10-18 DIAGNOSIS — Z9842 Cataract extraction status, left eye: Secondary | ICD-10-CM | POA: Diagnosis not present

## 2022-10-18 DIAGNOSIS — E7800 Pure hypercholesterolemia, unspecified: Secondary | ICD-10-CM | POA: Diagnosis present

## 2022-10-18 DIAGNOSIS — K219 Gastro-esophageal reflux disease without esophagitis: Secondary | ICD-10-CM | POA: Diagnosis present

## 2022-10-18 DIAGNOSIS — F32A Depression, unspecified: Secondary | ICD-10-CM | POA: Diagnosis present

## 2022-10-18 DIAGNOSIS — E1122 Type 2 diabetes mellitus with diabetic chronic kidney disease: Secondary | ICD-10-CM

## 2022-10-18 DIAGNOSIS — D649 Anemia, unspecified: Secondary | ICD-10-CM | POA: Diagnosis not present

## 2022-10-18 DIAGNOSIS — Z781 Physical restraint status: Secondary | ICD-10-CM | POA: Diagnosis not present

## 2022-10-18 DIAGNOSIS — Z833 Family history of diabetes mellitus: Secondary | ICD-10-CM | POA: Diagnosis not present

## 2022-10-18 DIAGNOSIS — N182 Chronic kidney disease, stage 2 (mild): Secondary | ICD-10-CM | POA: Diagnosis not present

## 2022-10-18 DIAGNOSIS — Z8249 Family history of ischemic heart disease and other diseases of the circulatory system: Secondary | ICD-10-CM | POA: Diagnosis not present

## 2022-10-18 DIAGNOSIS — Z888 Allergy status to other drugs, medicaments and biological substances status: Secondary | ICD-10-CM | POA: Diagnosis not present

## 2022-10-18 DIAGNOSIS — H409 Unspecified glaucoma: Secondary | ICD-10-CM | POA: Diagnosis present

## 2022-10-18 DIAGNOSIS — R4182 Altered mental status, unspecified: Secondary | ICD-10-CM | POA: Diagnosis not present

## 2022-10-18 DIAGNOSIS — E876 Hypokalemia: Secondary | ICD-10-CM | POA: Diagnosis not present

## 2022-10-18 DIAGNOSIS — N179 Acute kidney failure, unspecified: Secondary | ICD-10-CM | POA: Diagnosis present

## 2022-10-18 DIAGNOSIS — F03A0 Unspecified dementia, mild, without behavioral disturbance, psychotic disturbance, mood disturbance, and anxiety: Secondary | ICD-10-CM | POA: Diagnosis present

## 2022-10-18 DIAGNOSIS — G4710 Hypersomnia, unspecified: Secondary | ICD-10-CM | POA: Diagnosis not present

## 2022-10-18 DIAGNOSIS — E559 Vitamin D deficiency, unspecified: Secondary | ICD-10-CM | POA: Diagnosis present

## 2022-10-18 DIAGNOSIS — R918 Other nonspecific abnormal finding of lung field: Secondary | ICD-10-CM | POA: Diagnosis not present

## 2022-10-18 DIAGNOSIS — A0811 Acute gastroenteropathy due to Norwalk agent: Secondary | ICD-10-CM | POA: Diagnosis present

## 2022-10-18 DIAGNOSIS — D72829 Elevated white blood cell count, unspecified: Secondary | ICD-10-CM | POA: Diagnosis not present

## 2022-10-18 DIAGNOSIS — Z7984 Long term (current) use of oral hypoglycemic drugs: Secondary | ICD-10-CM | POA: Diagnosis not present

## 2022-10-18 DIAGNOSIS — D509 Iron deficiency anemia, unspecified: Secondary | ICD-10-CM | POA: Diagnosis present

## 2022-10-18 DIAGNOSIS — Z9841 Cataract extraction status, right eye: Secondary | ICD-10-CM | POA: Diagnosis not present

## 2022-10-18 DIAGNOSIS — Z823 Family history of stroke: Secondary | ICD-10-CM | POA: Diagnosis not present

## 2022-10-18 DIAGNOSIS — D4102 Neoplasm of uncertain behavior of left kidney: Secondary | ICD-10-CM | POA: Diagnosis not present

## 2022-10-18 DIAGNOSIS — R195 Other fecal abnormalities: Secondary | ICD-10-CM | POA: Diagnosis not present

## 2022-10-18 LAB — GLUCOSE, CAPILLARY
Glucose-Capillary: 210 mg/dL — ABNORMAL HIGH (ref 70–99)
Glucose-Capillary: 211 mg/dL — ABNORMAL HIGH (ref 70–99)
Glucose-Capillary: 205 mg/dL — ABNORMAL HIGH (ref 70–99)
Glucose-Capillary: 221 mg/dL — ABNORMAL HIGH (ref 70–99)

## 2022-10-18 LAB — BASIC METABOLIC PANEL
Anion gap: 10 (ref 5–15)
BUN: 33 mg/dL — ABNORMAL HIGH (ref 8–23)
CO2: 19 mmol/L — ABNORMAL LOW (ref 22–32)
Calcium: 7.7 mg/dL — ABNORMAL LOW (ref 8.9–10.3)
Chloride: 103 mmol/L (ref 98–111)
Creatinine, Ser: 0.89 mg/dL (ref 0.44–1.00)
GFR, Estimated: >60 mL/min (ref >60)
Glucose, Bld: 211 mg/dL — ABNORMAL HIGH (ref 70–99)
Potassium: 3 mmol/L — ABNORMAL LOW (ref 3.5–5.1)
Sodium: 132 mmol/L — ABNORMAL LOW (ref 135–145)

## 2022-10-18 LAB — MAGNESIUM: Magnesium: 1.7 mg/dL (ref 1.7–2.4)

## 2022-10-18 LAB — POTASSIUM: Potassium: 2.9 mmol/L — ABNORMAL LOW (ref 3.5–5.1)

## 2022-10-18 MED ORDER — POTASSIUM CHLORIDE CRYS ER 20 MEQ PO TBCR
40.0000 meq | EXTENDED_RELEASE_TABLET | ORAL | Status: AC
  Administered 2022-10-18: 40 meq via ORAL
  Filled 2022-10-18 (×2): qty 2

## 2022-10-18 MED ORDER — POTASSIUM CHLORIDE 10 MEQ/100ML IV SOLN
10.0000 meq | INTRAVENOUS | Status: AC
  Administered 2022-10-18 (×2): 10 meq via INTRAVENOUS
  Filled 2022-10-18 (×2): qty 100

## 2022-10-18 MED ORDER — POTASSIUM CHLORIDE 10 MEQ/100ML IV SOLN
INTRAVENOUS | Status: AC
  Administered 2022-10-18: 10 meq
  Filled 2022-10-18: qty 100

## 2022-10-18 MED ORDER — LORAZEPAM 0.5 MG PO TABS
0.5000 mg | ORAL_TABLET | Freq: Every evening | ORAL | Status: DC | PRN
  Administered 2022-10-18 – 2022-10-20 (×3): 0.5 mg via ORAL
  Filled 2022-10-18 (×3): qty 1

## 2022-10-18 MED ORDER — QUETIAPINE FUMARATE 25 MG PO TABS
12.5000 mg | ORAL_TABLET | Freq: Every evening | ORAL | Status: DC | PRN
  Administered 2022-10-18 – 2022-10-21 (×3): 12.5 mg via ORAL
  Filled 2022-10-18 (×4): qty 1

## 2022-10-18 MED ORDER — MAGNESIUM SULFATE IN D5W 1-5 GM/100ML-% IV SOLN
1.0000 g | Freq: Once | INTRAVENOUS | Status: AC
  Administered 2022-10-18: 1 g via INTRAVENOUS
  Filled 2022-10-18: qty 100

## 2022-10-18 NOTE — Progress Notes (Signed)
[facility]  PROGRESS NOTE  Michaela Santana AVW:[id number] DOB: 01-15-1946 DOA: 10/16/2022 PCP: Shon Hale, MD   Brief HPI:   77 year old female with medical history of diabetes mellitus type 2, hypertension, hyperlipidemia, depression, GERD, mild dementia, history of kidney stone was brought to the hospital with abdominal pain, nausea and vomiting.  Patient also has been having diarrhea.  Apparently many family members were sick at home.  Patient started on IV fluids.   Subjective   Patient seen and examined, more confused last night.  She was on restraints.  Mental status has significantly improved.  CT abdomen/pelvis done yesterday shows renal mass.  No further episodes of loose BM.   Assessment/Plan:    Diarrhea -Had black-colored stool today, hemoglobin has been stable -Concern for GI bleed -CTA abdomen/pelvis was negative for GI bleed -H&H has been stable -Diarrhea has resolved  Renal mass -Seen on CT abdomen/pelvis, -Discussed with Dr. Mosetta Putt, patient will follow Dr. Arbutus Ped as  outpatient  Urinary retention -Foley catheter was placed yesterday -Will discontinue Foley catheter today and do voiding trial -Check bladder scan as needed  Diabetes mellitus type 2 -Continue sliding scale insulin with NovoLog -CBG well-controlled  Acute kidney injury -Likely prerenal, resolved  Hypokalemia, hypomagnesemia -Potassium is still low at 2.9, magnesium 1.7 -Replace and follow magnesium and potassium level in a.m.   Medications     enoxaparin (LOVENOX) injection  40 mg Subcutaneous Q24H   insulin aspart  0-15 Units Subcutaneous TID WC   insulin aspart  0-5 Units Subcutaneous QHS     Data Reviewed:   CBG:  Recent Labs  Lab 10/17/22 1135 10/17/22 1654 10/17/22 2112 10/18/22 0731 10/18/22 1156  GLUCAP 187* 178* 197* 210* 211*    SpO2: 99 %    Vitals:   10/17/22 2205 10/18/22 0533 10/18/22 0729 10/18/22 1254  BP: 138/82 (!) 153/76 (!) 139/91 (!)  151/90  Pulse: (!) 102 97 98 95  Resp: 15 18 18 17   Temp: 99.7 F (37.6 C) 98.1 F (36.7 C) (!) 97.5 F (36.4 C) 97.8 F (36.6 C)  TempSrc: Oral  Oral Oral  SpO2: 97% 96% 96% 99%  Weight:      Height:          Data Reviewed:  Basic Metabolic Panel: Recent Labs  Lab 10/16/22 1426 10/17/22 0439 10/18/22 0500 10/18/22 1422  NA 133* 135 132*  --   K 3.8 3.1* 3.0* 2.9*  CL 101 106 103  --   CO2 20* 19* 19*  --   GLUCOSE 256* 238* 211*  --   BUN 46* 40* 33*  --   CREATININE 1.13* 0.98 0.89  --   CALCIUM 8.8* 7.9* 7.7*  --   MG 1.7 1.5* 1.7  --     CBC: Recent Labs  Lab 10/16/22 1620 10/17/22 0439 10/17/22 1757  WBC 11.0* 14.3*  --   NEUTROABS 9.1*  --   --   HGB 14.3 14.4 14.7  HCT 42.6 43.8 44.1  MCV 97.5 99.8  --   PLT 313 328  --     LFT Recent Labs  Lab 10/16/22 1426 10/17/22 0439  AST 27 17  ALT 24 19  ALKPHOS 72 54  BILITOT 0.8 0.7  PROT 7.1 5.5*  ALBUMIN 3.9 3.0*     Antibiotics: Anti-infectives (From admission, onward)    None        DVT prophylaxis: Lovenox  Code Status: Full code  Family Communication: Discussed with patient's daughter  at bedside   CONSULTS    Objective    Physical Examination:   General-appears in no acute distress Heart-S1-S2, regular, no murmur auscultated Lungs-clear to auscultation bilaterally, no wheezing or crackles auscultated Abdomen-soft, nontender, no organomegaly Extremities-no edema in the lower extremities Neuro-alert, oriented x3, no focal deficit noted   Status is: Inpatient:             Meredeth Ide   [facility] If 7PM-7AM, please contact night-coverage at [email or web address], Office  [phone]   10/18/2022, 3:21 PM  LOS: 0 days

## 2022-10-18 NOTE — Progress Notes (Signed)
    Patient Name: Michaela Santana           DOB: 07/04/1945  MRN: [id number]      Admission Date: 10/16/2022  Attending Provider: Meredeth Ide, MD  Primary Diagnosis: Intractable nausea and vomiting   Level of care: Med-Surg    CROSS COVER NOTE   Date of Service   10/18/2022   Michaela Santana, 77 y.o. female, was admitted on 10/16/2022 for Intractable nausea and vomiting.    HPI/Events of Note   Notified by nursing staff that patient is agitated, restless, and confused.  Patient with history of dementia is sundowning and attempting to remove medical equipment including Foley catheter.  Alert and oriented to self.   Unfortunately, she is not following commands despite multiple attempts at redirection by staff.  Family at bedside endorses Seroquel and Ativan use for agitation. Order placed for Seroquel tonight. Due to patient's continued agitation restraints has been placed. Restraint to be discontinued as soon as patient is able to safely have them removed.    Interventions/ Plan   Above        Anthoney Harada, DNP, [facility] [place]

## 2022-10-18 NOTE — Inpatient Diabetes Management (Signed)
Inpatient Diabetes Program Recommendations  AACE/ADA: New Consensus Statement on Inpatient Glycemic Control (2015)  Target Ranges:  Prepandial:   less than 140 mg/dL      Peak postprandial:   less than 180 mg/dL (1-2 hours)      Critically ill patients:  140 - 180 mg/dL   Lab Results  Component Value Date   GLUCAP 210 (H) 10/18/2022   HGBA1C 6.6 (H) 10/17/2022    Review of Glycemic Control  Latest Reference Range & Units 10/17/22 16:54 10/17/22 21:12 10/18/22 07:31  Glucose-Capillary 70 - 99 mg/dL 696 (H) 295 (H) 284 (H)  (H): Data is abnormally high Diabetes history: Type 2 DM Outpatient Diabetes medications: Glipizide 5 mg QD Current orders for Inpatient glycemic control: Novolog 0-15 units TID & HS  Inpatient Diabetes Program Recommendations:    Consider adding Semglee 8 units QD.    Thanks, Lujean Rave, MSN, RNC-OB Diabetes Coordinator [phone] (8a-5p)

## 2022-10-19 ENCOUNTER — Other Ambulatory Visit: Payer: Self-pay

## 2022-10-19 ENCOUNTER — Inpatient Hospital Stay (HOSPITAL_COMMUNITY): Payer: Medicare Other

## 2022-10-19 ENCOUNTER — Encounter (HOSPITAL_COMMUNITY): Payer: Self-pay | Admitting: Family Medicine

## 2022-10-19 DIAGNOSIS — E1122 Type 2 diabetes mellitus with diabetic chronic kidney disease: Secondary | ICD-10-CM | POA: Diagnosis not present

## 2022-10-19 DIAGNOSIS — R918 Other nonspecific abnormal finding of lung field: Secondary | ICD-10-CM | POA: Diagnosis not present

## 2022-10-19 DIAGNOSIS — R197 Diarrhea, unspecified: Secondary | ICD-10-CM | POA: Diagnosis not present

## 2022-10-19 DIAGNOSIS — R112 Nausea with vomiting, unspecified: Secondary | ICD-10-CM | POA: Diagnosis not present

## 2022-10-19 LAB — GASTROINTESTINAL PANEL BY PCR, STOOL (REPLACES STOOL CULTURE)

## 2022-10-19 LAB — CBC
HCT: 41.2 % (ref 36.0–46.0)
Hemoglobin: 13.7 g/dL (ref 12.0–15.0)
MCH: 32.4 pg (ref 26.0–34.0)
MCHC: 33.3 g/dL (ref 30.0–36.0)
MCV: 97.4 fL (ref 80.0–100.0)
Platelets: 314 10*3/uL (ref 150–400)
RBC: 4.23 MIL/uL (ref 3.87–5.11)
RDW: 12.7 % (ref 11.5–15.5)
WBC: 14.1 10*3/uL — ABNORMAL HIGH (ref 4.0–10.5)
nRBC: 0 % (ref 0.0–0.2)

## 2022-10-19 LAB — BASIC METABOLIC PANEL
Anion gap: 9 (ref 5–15)
BUN: 32 mg/dL — ABNORMAL HIGH (ref 8–23)
CO2: 19 mmol/L — ABNORMAL LOW (ref 22–32)
Calcium: 7.6 mg/dL — ABNORMAL LOW (ref 8.9–10.3)
Chloride: 102 mmol/L (ref 98–111)
Creatinine, Ser: 0.99 mg/dL (ref 0.44–1.00)
GFR, Estimated: 59 mL/min — ABNORMAL LOW (ref >60)
Glucose, Bld: 153 mg/dL — ABNORMAL HIGH (ref 70–99)
Potassium: 2.9 mmol/L — ABNORMAL LOW (ref 3.5–5.1)
Sodium: 130 mmol/L — ABNORMAL LOW (ref 135–145)

## 2022-10-19 LAB — GLUCOSE, CAPILLARY
Glucose-Capillary: 175 mg/dL — ABNORMAL HIGH (ref 70–99)
Glucose-Capillary: 131 mg/dL — ABNORMAL HIGH (ref 70–99)
Glucose-Capillary: 150 mg/dL — ABNORMAL HIGH (ref 70–99)
Glucose-Capillary: 156 mg/dL — ABNORMAL HIGH (ref 70–99)
Glucose-Capillary: 171 mg/dL — ABNORMAL HIGH (ref 70–99)

## 2022-10-19 LAB — OCCULT BLOOD X 1 CARD TO LAB, STOOL: Fecal Occult Bld: POSITIVE — AB

## 2022-10-19 MED ORDER — POTASSIUM CHLORIDE 10 MEQ/100ML IV SOLN
10.0000 meq | INTRAVENOUS | Status: AC
  Administered 2022-10-19 (×5): 10 meq via INTRAVENOUS
  Filled 2022-10-19 (×5): qty 100

## 2022-10-19 MED ORDER — SODIUM CHLORIDE 0.9% FLUSH: 10.0000 mL | INTRAVENOUS | Status: DC | PRN

## 2022-10-19 MED ORDER — SODIUM CHLORIDE 0.9% FLUSH
10.0000 mL | Freq: Two times a day (BID) | INTRAVENOUS | Status: DC
  Administered 2022-10-19 – 2022-10-22 (×3): 10 mL

## 2022-10-19 MED ORDER — CHLORHEXIDINE GLUCONATE CLOTH 2 % EX PADS
6.0000 | MEDICATED_PAD | Freq: Every day | CUTANEOUS | Status: DC
  Administered 2022-10-19 – 2022-10-21 (×2): 6 via TOPICAL

## 2022-10-19 NOTE — Progress Notes (Signed)
Noted patient had not voided during shift, patient had been denying urge to void. Encouraged patient to transfer to [facility] to attempt to void though patient c/o feeling tired and "I just want to sleep." Patient transferred to [facility] x1 min assist. Noted incont of loose stools, linen changed. Patient had very loose BM but did not void urine. Once patient returned to bed, bladder scan performed, reading . Notified MD, orders rec'd to place foley. Stool sample collected for GI panel and Fecal occult blood. MD notified of FOB results. Noted the date/time of collection of sample is wrong, dated 5/8. Discussed w/ lab tech, she will attempt to correct in EPIC.

## 2022-10-19 NOTE — Progress Notes (Signed)
Peripherally Inserted Central Catheter Placement  The IV Nurse has discussed with the patient and/or persons authorized to consent for the patient, the purpose of this procedure and the potential benefits and risks involved with this procedure.  The benefits include less needle sticks, lab draws from the catheter, and the patient may be discharged home with the catheter. Risks include, but not limited to, infection, bleeding, blood clot (thrombus formation), and puncture of an artery; nerve damage and irregular heartbeat and possibility to perform a PICC exchange if needed/ordered by physician.  Alternatives to this procedure were also discussed.  Bard Power PICC patient education guide, fact sheet on infection prevention and patient information card has been provided to patient /or left at bedside.    PICC Placement Documentation  PICC Double Lumen 10/19/22 Right Brachial 35 cm 0 cm (Active)  Indication for Insertion or Continuance of Line Poor Vasculature-patient has had multiple peripheral attempts or PIVs lasting less than 24 hours 10/19/22 1700  Exposed Catheter (cm) 0 cm 10/19/22 1700  Site Assessment Clean, Dry, Intact 10/19/22 1700  Lumen #1 Status Flushed;Saline locked;Blood return noted 10/19/22 1700  Lumen #2 Status Flushed;Saline locked;Blood return noted 10/19/22 1700  Dressing Type Transparent;Securing device 10/19/22 1700  Dressing Status Antimicrobial disc in place;Clean, Dry, Intact 10/19/22 1700  Safety Lock Intact 10/19/22 1700  Line Adjustment (NICU/IV Team Only) No 10/19/22 1700  Dressing Intervention New dressing;Other (Comment) 10/19/22 1700  Dressing Change Due 10/26/22 10/19/22 1700   Daughter signed consent at bedside    Maximino Greenland 10/19/2022, 5:28 PM

## 2022-10-19 NOTE — Progress Notes (Addendum)
[facility]  PROGRESS NOTE  Michaela Santana:[id number] DOB: March 18, 1946 DOA: 10/16/2022 PCP: Shon Hale, MD   Brief HPI:   77 year old female with medical history of diabetes mellitus type 2, hypertension, hyperlipidemia, depression, GERD, mild dementia, history of kidney stone was brought to the hospital with abdominal pain, nausea and vomiting.  Patient also has been having diarrhea.  Apparently many family members were sick at home.  Patient started on IV fluids.   Subjective   Patient seen and examined, diarrhea has resolved.  Potassium is still low at 2.9 despite potassium supplementation.  Bladder scan obtained today shows 500 cc of retained urine.   Assessment/Plan:    Diarrhea -Had black-colored stool today, hemoglobin has been stable -Concern for GI bleed -CTA abdomen/pelvis was negative for GI bleed -H&H has been stable -Diarrhea has resolved  FOBT positive -FOBT came back positive -Recheck CBC, likely from enteritis -If hemoglobin dropping, will consider GI consultation  Renal mass -Seen on CT abdomen/pelvis, -Discussed with Dr. Mosetta Putt, patient will follow Dr. Arbutus Ped as  outpatient  Urinary retention -Foley catheter was placed for urinary retention -Foley catheter was discontinued on 10/18/2022 for voiding trial -Bladder scan checked today again shows 500 cc of retained urine -Will reinsert Foley catheter -Will get urology consultation  Diabetes mellitus type 2 -Continue sliding scale insulin with NovoLog -CBG well-controlled  Acute kidney injury -Likely prerenal, resolved  Hypokalemia, hypomagnesemia -Potassium is still low at 2.9, -Replace and follow serum potassium in a.m.   Medications     enoxaparin (LOVENOX) injection  40 mg Subcutaneous Q24H   insulin aspart  0-15 Units Subcutaneous TID WC   insulin aspart  0-5 Units Subcutaneous QHS     Data Reviewed:   CBG:  Recent Labs  Lab 10/18/22 1638 10/18/22 2130 10/19/22 0343  10/19/22 0725 10/19/22 1138  GLUCAP 205* 221* 175* 131* 150*    SpO2: 99 %    Vitals:   10/18/22 1254 10/18/22 2059 10/19/22 0554 10/19/22 1137  BP: (!) 151/90 (!) 149/87 138/87 (!) 141/100  Pulse: 95 (!) 105 95 99  Resp: 17 18 16 18   Temp: 97.8 F (36.6 C) 98.2 F (36.8 C) 98.5 F (36.9 C) 98.5 F (36.9 C)  TempSrc: Oral  Oral Oral  SpO2: 99% 99% 96% 99%  Weight:      Height:          Data Reviewed:  Basic Metabolic Panel: Recent Labs  Lab 10/16/22 1426 10/17/22 0439 10/18/22 0500 10/18/22 1422 10/19/22 0457  NA 133* 135 132*  --  130*  K 3.8 3.1* 3.0* 2.9* 2.9*  CL 101 106 103  --  102  CO2 20* 19* 19*  --  19*  GLUCOSE 256* 238* 211*  --  153*  BUN 46* 40* 33*  --  32*  CREATININE 1.13* 0.98 0.89  --  0.99  CALCIUM 8.8* 7.9* 7.7*  --  7.6*  MG 1.7 1.5* 1.7  --   --     CBC: Recent Labs  Lab 10/16/22 1620 10/17/22 0439 10/17/22 1757  WBC 11.0* 14.3*  --   NEUTROABS 9.1*  --   --   HGB 14.3 14.4 14.7  HCT 42.6 43.8 44.1  MCV 97.5 99.8  --   PLT 313 328  --     LFT Recent Labs  Lab 10/16/22 1426 10/17/22 0439  AST 27 17  ALT 24 19  ALKPHOS 72 54  BILITOT 0.8 0.7  PROT 7.1 5.5*  ALBUMIN  3.9 3.0*     Antibiotics: Anti-infectives (From admission, onward)    None        DVT prophylaxis: Lovenox  Code Status: Full code  Family Communication: Discussed with patient's daughter at bedside   CONSULTS    Objective    Physical Examination:   General-appears in no acute distress Heart-S1-S2, regular, no murmur auscultated Lungs-clear to auscultation bilaterally, no wheezing or crackles auscultated Abdomen-soft, nontender, no organomegaly Extremities-no edema in the lower extremities Neuro-alert, oriented x3, no focal deficit noted   Status is: Inpatient:             Meredeth Ide   [facility] If 7PM-7AM, please contact night-coverage at [email or web address], Office  [phone]   10/19/2022, 2:10 PM  LOS:  1 day

## 2022-10-19 NOTE — Consult Note (Signed)
Urology Consult  Referring physician: Dr. Sharl Ma Reason for referral: urinary retention, renal mass  Chief Complaint: My stomach hurts  History of Present Illness: Michaela Santana is a 77 year old female presenting to [facility] with complaint of abdominal pain, N/V, and diarrhea.  The patient was started on IV fluids and admitted for further workup.  She is severely hypokalemic.  CT angiogram of her abdomen revealed an incidental finding of a 1.9 x 1.8 mm enhancing left renal mass, suggestive of renal cell carcinoma.  She was also thought to be in urinary retention.  [facility] was consulted to speak to these 2 findings.    On my arrival was alert, oriented, and in no distress.  Her daughter joined [place] and we all discussed her CT findings and the need to follow-up closely.  She does not carry a history of urinary retention though she mention she had multiple kidney infections while she was young and 3 urinary tract infections last year.  Past Medical History:  Diagnosis Date   Allergic rhinitis    Anemia    Arthritis    "probably in hands" (07/17/2016)   CKD (chronic kidney disease), stage III (HCC)    Depression    Esophageal reflux    Family history of adverse reaction to anesthesia    "her father had some issues when he had his gallbladder taken out when he was in his late 54s"   GERD (gastroesophageal reflux disease)    Glaucoma    open angle, bilateral   High cholesterol    History of kidney stones    Hypercholesterolemia    Hypertension    Kidney cysts    Lumbago with sciatica, right side    Sepsis (HCC) 07/2016   d/t flu   Type II diabetes mellitus (HCC)    Vitamin D deficiency    Past Surgical History:  Procedure Laterality Date   BREAST BIOPSY  1990   "? side; it wasn't anything"   BRONCHIAL BIOPSY  08/01/2021   Procedure: BRONCHIAL BIOPSIES;  Surgeon: Josephine Igo, DO;  Location: [facility];  Service: Pulmonary;;   BRONCHIAL NEEDLE ASPIRATION BIOPSY   08/01/2021   Procedure: BRONCHIAL NEEDLE ASPIRATION BIOPSIES;  Surgeon: Josephine Igo, DO;  Location: [facility];  Service: Pulmonary;;   CATARACT EXTRACTION, BILATERAL  2021   CHOLECYSTECTOMY OPEN  1971   COLONOSCOPY     2001,2011,2023   TUBAL LIGATION  1978   VIDEO BRONCHOSCOPY WITH RADIAL ENDOBRONCHIAL ULTRASOUND  08/01/2021   Procedure: RADIAL ENDOBRONCHIAL ULTRASOUND;  Surgeon: Josephine Igo, DO;  Location: [facility];  Service: Pulmonary;;     Allergies:  Allergies  Allergen Reactions   Codeine Hypertension   Alprazolam Other (See Comments)    lethargic    Family History  Problem Relation Age of Onset   Dementia Mother    Diabetes Mother    Stroke Father    Hypertension Father    CVA Father    Diabetes Sister    Hypertension Sister    Heart disease Paternal Grandfather    Breast cancer Neg Hx    Social History:  reports that she has never smoked. She has never used smokeless tobacco. She reports that she does not drink alcohol and does not use drugs.  Review of Systems  Gastrointestinal:  Positive for abdominal pain, diarrhea and nausea.     Physical Exam:  Vital signs in last 24 hours: Temp:  [98.2 F (36.8 C)-98.5 F (36.9 C)] 98.5 F (  36.9 C) (05/10 1137) Pulse Rate:  [95-105] 99 (05/10 1137) Resp:  [16-18] 18 (05/10 1137) BP: (138-149)/(87-100) 141/100 (05/10 1137) SpO2:  [96 %-99 %] 99 % (05/10 1137)  Cardiovascular: Skin warm; not flushed Respiratory: Breaths quiet; no shortness of breath Abdomen: No masses, soft, no guarding or rebound Neurological: Normal sensation to touch Musculoskeletal: Normal motor function arms and legs Skin: No rashes Genitourinary: foley in place draining clear yellow urine  Laboratory Data:  Results for orders placed or performed during the hospital encounter of 10/16/22 (from the past 72 hour(s))  CBC with Differential/Platelet     Status: Abnormal   Collection Time: 10/16/22  4:20 PM  Result Value Ref  Range   WBC 11.0 (H) 4.0 - 10.5 K/uL   RBC 4.37 3.87 - 5.11 MIL/uL   Hemoglobin 14.3 12.0 - 15.0 g/dL   HCT 82.9 56.2 - 13.0 %   MCV 97.5 80.0 - 100.0 fL   MCH 32.7 26.0 - 34.0 pg   MCHC 33.6 30.0 - 36.0 g/dL   RDW 86.5 78.4 - 69.6 %   Platelets 313 150 - 400 K/uL   nRBC 0.0 0.0 - 0.2 %   Neutrophils Relative % 84 %   Neutro Abs 9.1 (H) 1.7 - 7.7 K/uL   Lymphocytes Relative 8 %   Lymphs Abs 0.9 0.7 - 4.0 K/uL   Monocytes Relative 8 %   Monocytes Absolute 0.9 0.1 - 1.0 K/uL   Eosinophils Relative 0 %   Eosinophils Absolute 0.0 0.0 - 0.5 K/uL   Basophils Relative 0 %   Basophils Absolute 0.0 0.0 - 0.1 K/uL   Immature Granulocytes 0 %   Abs Immature Granulocytes 0.04 0.00 - 0.07 K/uL    Comment: Performed at [facility], [address] [place], [place] [address]  Glucose, capillary     Status: Abnormal   Collection Time: 10/16/22  9:57 PM  Result Value Ref Range   Glucose-Capillary 264 (H) 70 - 99 mg/dL    Comment: Glucose reference range applies only to samples taken after fasting for at least 8 hours.  Hemoglobin A1c     Status: Abnormal   Collection Time: 10/17/22  4:39 AM  Result Value Ref Range   Hgb A1c MFr Bld 6.6 (H) 4.8 - 5.6 %    Comment: (NOTE) Pre diabetes:          5.7%-6.4%  Diabetes:              >6.4%  Glycemic control for   <7.0% adults with diabetes    Mean Plasma Glucose 142.72 mg/dL    Comment: Performed at [facility], [address], [place], [place] [address]  CBC     Status: Abnormal   Collection Time: 10/17/22  4:39 AM  Result Value Ref Range   WBC 14.3 (H) 4.0 - 10.5 K/uL   RBC 4.39 3.87 - 5.11 MIL/uL   Hemoglobin 14.4 12.0 - 15.0 g/dL   HCT 40.1 02.7 - 25.3 %   MCV 99.8 80.0 - 100.0 fL   MCH 32.8 26.0 - 34.0 pg   MCHC 32.9 30.0 - 36.0 g/dL   RDW 66.4 40.3 - 47.4 %   Platelets 328 150 - 400 K/uL   nRBC 0.0 0.0 - 0.2 %    Comment: Performed at [facility], [address], [place], [place]  [address]  Comprehensive metabolic panel     Status: Abnormal   Collection Time: 10/17/22  4:39  AM  Result Value Ref Range   Sodium 135 135 - 145 mmol/L   Potassium 3.1 (L) 3.5 - 5.1 mmol/L   Chloride 106 98 - 111 mmol/L   CO2 19 (L) 22 - 32 mmol/L   Glucose, Bld 238 (H) 70 - 99 mg/dL    Comment: Glucose reference range applies only to samples taken after fasting for at least 8 hours.   BUN 40 (H) 8 - 23 mg/dL   Creatinine, Ser 7.32 0.44 - 1.00 mg/dL   Calcium 7.9 (L) 8.9 - 10.3 mg/dL   Total Protein 5.5 (L) 6.5 - 8.1 g/dL   Albumin 3.0 (L) 3.5 - 5.0 g/dL   AST 17 15 - 41 U/L   ALT 19 0 - 44 U/L   Alkaline Phosphatase 54 38 - 126 U/L   Total Bilirubin 0.7 0.3 - 1.2 mg/dL   GFR, Estimated >20 >25 mL/min    Comment: (NOTE) Calculated using the CKD-EPI Creatinine Equation (2021)    Anion gap 10 5 - 15    Comment: Performed at [facility], [address] [place], [place] [address]  Magnesium     Status: Abnormal   Collection Time: 10/17/22  4:39 AM  Result Value Ref Range   Magnesium 1.5 (L) 1.7 - 2.4 mg/dL    Comment: Performed at [facility], [address], [place], [place] [address]  Glucose, capillary     Status: Abnormal   Collection Time: 10/17/22  7:17 AM  Result Value Ref Range   Glucose-Capillary 247 (H) 70 - 99 mg/dL    Comment: Glucose reference range applies only to samples taken after fasting for at least 8 hours.  Glucose, capillary     Status: Abnormal   Collection Time: 10/17/22 11:35 AM  Result Value Ref Range   Glucose-Capillary 187 (H) 70 - 99 mg/dL    Comment: Glucose reference range applies only to samples taken after fasting for at least 8 hours.  Glucose, capillary     Status: Abnormal   Collection Time: 10/17/22  4:54 PM  Result Value Ref Range   Glucose-Capillary 178 (H) 70 - 99 mg/dL    Comment: Glucose reference range applies only to samples taken after fasting for at least 8 hours.  Occult blood card to lab,  stool     Status: Abnormal   Collection Time: 10/17/22  4:57 PM  Result Value Ref Range   Fecal Occult Bld POSITIVE (A) NEGATIVE    Comment: Performed at [facility], [address], [place], [place] [address]  Hemoglobin and hematocrit, blood     Status: None   Collection Time: 10/17/22  5:57 PM  Result Value Ref Range   Hemoglobin 14.7 12.0 - 15.0 g/dL   HCT 76.1 60.7 - 37.1 %    Comment: Performed at [facility], [address], [place], [place] [address]  Glucose, capillary     Status: Abnormal   Collection Time: 10/17/22  9:12 PM  Result Value Ref Range   Glucose-Capillary 197 (H) 70 - 99 mg/dL    Comment: Glucose reference range applies only to samples taken after fasting for at least 8 hours.  Basic metabolic panel     Status: Abnormal   Collection Time: 10/18/22  5:00 AM  Result Value Ref Range   Sodium 132 (L) 135 - 145 mmol/L   Potassium 3.0 (L) 3.5 - 5.1 mmol/L   Chloride 103 98 - 111 mmol/L  CO2 19 (L) 22 - 32 mmol/L   Glucose, Bld 211 (H) 70 - 99 mg/dL    Comment: Glucose reference range applies only to samples taken after fasting for at least 8 hours.   BUN 33 (H) 8 - 23 mg/dL   Creatinine, Ser 8.29 0.44 - 1.00 mg/dL   Calcium 7.7 (L) 8.9 - 10.3 mg/dL   GFR, Estimated >56 >21 mL/min    Comment: (NOTE) Calculated using the CKD-EPI Creatinine Equation (2021)    Anion gap 10 5 - 15    Comment: Performed at [facility], [address] [place], [place] [address]  Magnesium     Status: None   Collection Time: 10/18/22  5:00 AM  Result Value Ref Range   Magnesium 1.7 1.7 - 2.4 mg/dL    Comment: Performed at [facility], [address] [place], [place] [address]  Glucose, capillary     Status: Abnormal   Collection Time: 10/18/22  7:31 AM  Result Value Ref Range   Glucose-Capillary 210 (H) 70 - 99 mg/dL    Comment: Glucose reference range applies only to samples taken after fasting for  at least 8 hours.  Glucose, capillary     Status: Abnormal   Collection Time: 10/18/22 11:56 AM  Result Value Ref Range   Glucose-Capillary 211 (H) 70 - 99 mg/dL    Comment: Glucose reference range applies only to samples taken after fasting for at least 8 hours.  Potassium     Status: Abnormal   Collection Time: 10/18/22  2:22 PM  Result Value Ref Range   Potassium 2.9 (L) 3.5 - 5.1 mmol/L    Comment: Performed at [facility], [address], [place], [place] [address]  Glucose, capillary     Status: Abnormal   Collection Time: 10/18/22  4:38 PM  Result Value Ref Range   Glucose-Capillary 205 (H) 70 - 99 mg/dL    Comment: Glucose reference range applies only to samples taken after fasting for at least 8 hours.  Glucose, capillary     Status: Abnormal   Collection Time: 10/18/22  9:30 PM  Result Value Ref Range   Glucose-Capillary 221 (H) 70 - 99 mg/dL    Comment: Glucose reference range applies only to samples taken after fasting for at least 8 hours.  Glucose, capillary     Status: Abnormal   Collection Time: 10/19/22  3:43 AM  Result Value Ref Range   Glucose-Capillary 175 (H) 70 - 99 mg/dL    Comment: Glucose reference range applies only to samples taken after fasting for at least 8 hours.  Basic metabolic panel     Status: Abnormal   Collection Time: 10/19/22  4:57 AM  Result Value Ref Range   Sodium 130 (L) 135 - 145 mmol/L   Potassium 2.9 (L) 3.5 - 5.1 mmol/L   Chloride 102 98 - 111 mmol/L   CO2 19 (L) 22 - 32 mmol/L   Glucose, Bld 153 (H) 70 - 99 mg/dL    Comment: Glucose reference range applies only to samples taken after fasting for at least 8 hours.   BUN 32 (H) 8 - 23 mg/dL   Creatinine, Ser 8.41 0.44 - 1.00 mg/dL   Calcium 7.6 (L) 8.9 - 10.3 mg/dL   GFR, Estimated 59 (L) >60 mL/min    Comment: (NOTE) Calculated using the CKD-EPI Creatinine Equation (2021)    Anion gap 9 5 - 15    Comment: Performed at  [facility], [address], [place], [place] [address]  Glucose, capillary     Status: Abnormal   Collection Time: 10/19/22  7:25 AM  Result Value Ref Range   Glucose-Capillary 131 (H) 70 - 99 mg/dL    Comment: Glucose reference range applies only to samples taken after fasting for at least 8 hours.  Glucose, capillary     Status: Abnormal   Collection Time: 10/19/22 11:38 AM  Result Value Ref Range   Glucose-Capillary 150 (H) 70 - 99 mg/dL    Comment: Glucose reference range applies only to samples taken after fasting for at least 8 hours.   No results found for this or any previous visit (from the past 240 hour(s)). Creatinine: Recent Labs    10/16/22 1426 10/17/22 0439 10/18/22 0500 10/19/22 0457  CREATININE 1.13* 0.98 0.89 0.99    Imaging: CT angiogram of abdomen collected on 5/8 shows a full but not grossly distended bladder and no excessive stool burden or obstructing mass of bladder outlet. Enhancing mass on left kidney identified, concerning for renal cell carcinoma.  Some atrophy/parenchymal thinning.  Assessment/Plan:   Bladder scan read around 500 cc.  Foley catheter was placed.  On my arrival to the total of the bladder contents after Foley placement and all urine output since, only equaled 300cc.  It is probable that the patient did not feel she needed to urinate because her bladder was still well under its functional capacity.  I bladder scanned her again within the room and it was still picking up about 120cc from somewhere with the Foley was in place.  Presumably the liquid stool in her rectal vault.  I do not know that she is experiencing urinary retention.  With her diarrhea, Foley catheters presence will increase her probability of developing a UTI if she does not have one already.  Can be removed at the discretion of primary team.  Bladder scans frequently provide spurious data if the patient has a large amount of abdominal girth, as in this case, or known ascites.  As long as her renal  function is preserved, this can be assessed symptomatically.  Recommend collecting urine culture.  Patient was emotional and concerned over her possible renal cell carcinoma.  We talked for a while and I reassured her that this was slow-growing and treatable.  Her daughter joined [place] and expressed understanding that we have time to address this but she will need to follow-up with [place] closely.  Will have our schedulers contact her daughter for close follow up.  Scherrie Bateman Mackenzie Groom 10/19/2022, 3:06 PM  Pager: [phone]

## 2022-10-19 NOTE — Evaluation (Signed)
Physical Therapy Evaluation Patient Details Name: Michaela Santana MRN: [id number] DOB: 05/10/1946 Today's Date: 10/19/2022  History of Present Illness  77 yo female brought to the hospital with abdominal pain, nausea and vomiting. Pt admitted with intractable nausea and vomiting. PMH: diabetes mellitus type 2, HTN, hyperlipidemia, depression, GERD, mild dementia, kidney stone  Clinical Impression  Pt admitted with above diagnosis. Pt from home with daughter who works from home, also has another daughter able to assist as needed, using SPC in the home and rollator in the community, daughter estimates 2 falls in the last 6 months. Pt needing mod A to upright trunk into sitting, pt becomes nauseas, belching and holding emesis bag, declines standing due to increase in nausea. Assisted pt back to supine with min A and all needs in reach. Daughter and pt report goal is to return home and resume OPPT. Eval limited by nausea, will progress acute PT as able. Pt currently with functional limitations due to the deficits listed below (see PT Problem List). Pt will benefit from acute skilled PT to increase their independence and safety with mobility to allow discharge.          Recommendations for follow up therapy are one component of a multi-disciplinary discharge planning process, led by the attending physician.  Recommendations may be updated based on patient status, additional functional criteria and insurance authorization.  Follow Up Recommendations       Assistance Recommended at Discharge Frequent or constant Supervision/Assistance  Patient can return home with the following  A little help with walking and/or transfers;A little help with bathing/dressing/bathroom;Assistance with cooking/housework;Assist for transportation    Equipment Recommendations None recommended by PT  Recommendations for Other Services       Functional Status Assessment Patient has had a recent decline in their functional  status and demonstrates the ability to make significant improvements in function in a reasonable and predictable amount of time.     Precautions / Restrictions        Mobility  Bed Mobility Overal bed mobility: Needs Assistance Bed Mobility: Supine to Sit, Sit to Supine  Supine to sit: Mod assist Sit to supine: Min assist  General bed mobility comments: mod A to upright trunk into sitting, pt using bedrails, increased time and effort; min A to return to supine    Transfers  General transfer comment: pt becomes nauseas upon sitting, holding emesis bag and with belching, declines standing due to nausea    Ambulation/Gait   Stairs            Wheelchair Mobility    Modified Rankin (Stroke Patients Only)       Balance Overall balance assessment: Needs assistance Sitting-balance support: Feet supported Sitting balance-Leahy Scale: Good        Pertinent Vitals/Pain Pain Assessment Pain Assessment: No/denies pain ("nauseas")    Home Living Family/patient expects to be discharged to:: Private residence Living Arrangements: Children (daughter works from home) Available Help at Discharge: Family;Available 24 hours/day Type of Home: House Home Access: Stairs to enter Entrance Stairs-Rails: None Entrance Stairs-Number of Steps: 1 threshold   Home Layout: One level Home Equipment: Shower seat;Cane - single Librarian, academic (2 wheels);Rollator (4 wheels) Additional Comments: pt's daughter reports pt active with OPPT for back pain and balance    Prior Function Prior Level of Function : Independent/Modified Independent  Mobility Comments: pt using SPC in the home, rollator in the community, daughter reports ~2 falls in last 6 months ADLs Comments: pt ind with  self care, daughter completes cooking and pt performing household chores     Hand Dominance        Extremity/Trunk Assessment   Upper Extremity Assessment Upper Extremity Assessment: Overall WFL for  tasks assessed    Lower Extremity Assessment Lower Extremity Assessment: Generalized weakness (AROM WFL, strength grossly 3+/5, reports "a little" numbess to bil plantar surfaces of feet)    Cervical / Trunk Assessment Cervical / Trunk Assessment: Normal  Communication   Communication: No difficulties  Cognition Arousal/Alertness: Awake/alert Behavior During Therapy: WFL for tasks assessed/performed Overall Cognitive Status: History of cognitive impairments - at baseline  General Comments: pt oriented to self, hospital, and situation; pt tearful at times regarding renal mass noted on CT, daughter and therapist offering encouragement        General Comments      Exercises     Assessment/Plan    PT Assessment Patient needs continued PT services  PT Problem List Decreased strength;Decreased activity tolerance;Decreased balance;Decreased mobility;Decreased knowledge of use of DME;Decreased cognition       PT Treatment Interventions DME instruction;Gait training;Functional mobility training;Therapeutic activities;Therapeutic exercise;Balance training;Neuromuscular re-education;Patient/family education    PT Goals (Current goals can be found in the Care Plan section)  Acute Rehab PT Goals Patient Stated Goal: resume OPPT PT Goal Formulation: With patient/family Time For Goal Achievement: 11/02/22 Potential to Achieve Goals: Good    Frequency Min 1X/week     Co-evaluation               AM-PAC PT "6 Clicks" Mobility  Outcome Measure Help needed turning from your back to your side while in a flat bed without using bedrails?: A Little Help needed moving from lying on your back to sitting on the side of a flat bed without using bedrails?: A Lot Help needed moving to and from a bed to a chair (including a wheelchair)?: A Lot Help needed standing up from a chair using your arms (e.g., wheelchair or bedside chair)?: A Lot Help needed to walk in hospital room?: A Lot Help  needed climbing 3-5 steps with a railing? : Total 6 Click Score: 12    End of Session   Activity Tolerance: Patient tolerated treatment well;Other (comment) (limited by nausea) Patient left: in bed;with call bell/phone within reach;with bed alarm set;with family/visitor present Nurse Communication: Mobility status PT Visit Diagnosis: Other abnormalities of gait and mobility (R26.89);Muscle weakness (generalized) (M62.81)    Time: 1540-1600 PT Time Calculation (min) (ACUTE ONLY): 20 min   Charges:   PT Evaluation $PT Eval Low Complexity: 1 Low           Tori Mikhaela Zaugg PT, DPT 10/19/22, 4:18 PM

## 2022-10-20 ENCOUNTER — Inpatient Hospital Stay (HOSPITAL_COMMUNITY): Payer: Medicare Other

## 2022-10-20 DIAGNOSIS — R918 Other nonspecific abnormal finding of lung field: Secondary | ICD-10-CM | POA: Diagnosis not present

## 2022-10-20 DIAGNOSIS — R197 Diarrhea, unspecified: Secondary | ICD-10-CM | POA: Diagnosis not present

## 2022-10-20 DIAGNOSIS — E1122 Type 2 diabetes mellitus with diabetic chronic kidney disease: Secondary | ICD-10-CM | POA: Diagnosis not present

## 2022-10-20 DIAGNOSIS — R112 Nausea with vomiting, unspecified: Secondary | ICD-10-CM | POA: Diagnosis not present

## 2022-10-20 LAB — GLUCOSE, CAPILLARY
Glucose-Capillary: 159 mg/dL — ABNORMAL HIGH (ref 70–99)
Glucose-Capillary: 148 mg/dL — ABNORMAL HIGH (ref 70–99)
Glucose-Capillary: 137 mg/dL — ABNORMAL HIGH (ref 70–99)
Glucose-Capillary: 224 mg/dL — ABNORMAL HIGH (ref 70–99)

## 2022-10-20 LAB — BASIC METABOLIC PANEL
Anion gap: 6 (ref 5–15)
BUN: 19 mg/dL (ref 8–23)
CO2: 19 mmol/L — ABNORMAL LOW (ref 22–32)
Calcium: 7.4 mg/dL — ABNORMAL LOW (ref 8.9–10.3)
Chloride: 102 mmol/L (ref 98–111)
Creatinine, Ser: 0.79 mg/dL (ref 0.44–1.00)
GFR, Estimated: >60 mL/min (ref >60)
Glucose, Bld: 148 mg/dL — ABNORMAL HIGH (ref 70–99)
Potassium: 3.1 mmol/L — ABNORMAL LOW (ref 3.5–5.1)
Sodium: 127 mmol/L — ABNORMAL LOW (ref 135–145)

## 2022-10-20 MED ORDER — POTASSIUM CHLORIDE 20 MEQ PO PACK
40.0000 meq | PACK | Freq: Two times a day (BID) | ORAL | Status: AC
  Administered 2022-10-20 – 2022-10-21 (×2): 40 meq via ORAL
  Filled 2022-10-20 (×2): qty 2

## 2022-10-20 MED ORDER — ACETAMINOPHEN 325 MG PO TABS: 650.0000 mg | ORAL_TABLET | Freq: Four times a day (QID) | ORAL | Status: DC | PRN

## 2022-10-20 NOTE — Progress Notes (Signed)
[facility]  PROGRESS NOTE  RAYNNA HOLVECK ZOX:[id number] DOB: [birth year] DOA: 10/16/2022 PCP: Shon Hale, MD   Brief HPI:   77 year old female with medical history of diabetes mellitus type 2, hypertension, hyperlipidemia, depression, GERD, mild dementia, history of kidney stone was brought to the hospital with abdominal pain, nausea and vomiting.  Patient also has been having diarrhea.  Apparently many family members were sick at home.  Patient started on IV fluids.   Subjective   Patient very somnolent, very hard to arouse today.  She opens eyes to sternal rub and answers questions appropriately    Assessment/Plan:    Diarrhea -Had black-colored stool  -Concern for GI bleed -CTA abdomen/pelvis was negative for GI bleed -H&H has been stable -Diarrhea has resolved  Hypersomnolence -Patient hypersomnolent today -Will obtain CT head to rule out acute intracranial pathology -Urine culture ordered   FOBT positive -FOBT came back positive -Recheck CBC, likely from enteritis -Hemoglobin has been stable at 13.7.  No further episodes of black stool.  Renal mass -Seen on CT abdomen/pelvis, -Discussed with Dr. Mosetta Putt, patient will follow Dr. Arbutus Ped as  outpatient -Patient already seen by urology, will follow-up urology as outpatient  Urinary retention -Foley catheter was placed for urinary retention -Foley catheter was discontinued on 10/18/2022 for voiding trial -Bladder scan checked today again shows 500 cc of retained urine -Foley catheter was reinserted and urology was consulted -Urology did not feel that patient has urinary retention, recommend to remove Foley when able -Will order urine culture   Diabetes mellitus type 2 -Continue sliding scale insulin with NovoLog -CBG well-controlled  Acute kidney injury -Likely prerenal, resolved  Hypokalemia -Potassium was replaced yesterday -Labs have been obtained, result currently pending   Medications      Chlorhexidine Gluconate Cloth  6 each Topical Daily   insulin aspart  0-15 Units Subcutaneous TID WC   insulin aspart  0-5 Units Subcutaneous QHS   sodium chloride flush  10-40 mL Intracatheter Q12H     Data Reviewed:   CBG:  Recent Labs  Lab 10/19/22 0725 10/19/22 1138 10/19/22 1721 10/19/22 2120 10/20/22 0730  GLUCAP 131* 150* 156* 171* 159*    SpO2: 96 %    Vitals:   10/19/22 0554 10/19/22 1137 10/19/22 2118 10/20/22 0531  BP: 138/87 (!) 141/100 120/88 116/67  Pulse: 95 99 82 74  Resp: 16 18 17 19   Temp: 98.5 F (36.9 C) 98.5 F (36.9 C) 98.9 F (37.2 C) 98.3 F (36.8 C)  TempSrc: Oral Oral Oral   SpO2: 96% 99% 97% 96%  Weight:      Height:          Data Reviewed:  Basic Metabolic Panel: Recent Labs  Lab 10/16/22 1426 10/17/22 0439 10/18/22 0500 10/18/22 1422 10/19/22 0457  NA 133* 135 132*  --  130*  K 3.8 3.1* 3.0* 2.9* 2.9*  CL 101 106 103  --  102  CO2 20* 19* 19*  --  19*  GLUCOSE 256* 238* 211*  --  153*  BUN 46* 40* 33*  --  32*  CREATININE 1.13* 0.98 0.89  --  0.99  CALCIUM 8.8* 7.9* 7.7*  --  7.6*  MG 1.7 1.5* 1.7  --   --     CBC: Recent Labs  Lab 10/16/22 1620 10/17/22 0439 10/17/22 1757 10/19/22 1541  WBC 11.0* 14.3*  --  14.1*  NEUTROABS 9.1*  --   --   --   HGB 14.3 14.4 14.7  13.7  HCT 42.6 43.8 44.1 41.2  MCV 97.5 99.8  --  97.4  PLT 313 328  --  314    LFT Recent Labs  Lab 10/16/22 1426 10/17/22 0439  AST 27 17  ALT 24 19  ALKPHOS 72 54  BILITOT 0.8 0.7  PROT 7.1 5.5*  ALBUMIN 3.9 3.0*     Antibiotics: Anti-infectives (From admission, onward)    None        DVT prophylaxis: Lovenox  Code Status: Full code  Family Communication: Discussed with patient's daughter at bedside   CONSULTS    Objective    Physical Examination:   Appears in no acute distress S1-S2, regular Hypersomnolent, opens eyes to sternal rub and answers questions appropriately   Status is: Inpatient:              Meredeth Ide   [facility] If 7PM-7AM, please contact night-coverage at [email or web address], Office  [phone]   10/20/2022, 11:36 AM  LOS: 2 days

## 2022-10-21 DIAGNOSIS — R112 Nausea with vomiting, unspecified: Secondary | ICD-10-CM | POA: Diagnosis not present

## 2022-10-21 DIAGNOSIS — D72829 Elevated white blood cell count, unspecified: Secondary | ICD-10-CM | POA: Diagnosis not present

## 2022-10-21 DIAGNOSIS — R197 Diarrhea, unspecified: Secondary | ICD-10-CM | POA: Diagnosis not present

## 2022-10-21 DIAGNOSIS — R918 Other nonspecific abnormal finding of lung field: Secondary | ICD-10-CM | POA: Diagnosis not present

## 2022-10-21 LAB — BASIC METABOLIC PANEL
Anion gap: 11 (ref 5–15)
BUN: 13 mg/dL (ref 8–23)
CO2: 19 mmol/L — ABNORMAL LOW (ref 22–32)
Calcium: 7.9 mg/dL — ABNORMAL LOW (ref 8.9–10.3)
Chloride: 101 mmol/L (ref 98–111)
Creatinine, Ser: 0.67 mg/dL (ref 0.44–1.00)
GFR, Estimated: >60 mL/min (ref >60)
Glucose, Bld: 177 mg/dL — ABNORMAL HIGH (ref 70–99)
Potassium: 3.3 mmol/L — ABNORMAL LOW (ref 3.5–5.1)
Sodium: 131 mmol/L — ABNORMAL LOW (ref 135–145)

## 2022-10-21 LAB — GLUCOSE, CAPILLARY
Glucose-Capillary: 198 mg/dL — ABNORMAL HIGH (ref 70–99)
Glucose-Capillary: 166 mg/dL — ABNORMAL HIGH (ref 70–99)
Glucose-Capillary: 203 mg/dL — ABNORMAL HIGH (ref 70–99)
Glucose-Capillary: 165 mg/dL — ABNORMAL HIGH (ref 70–99)

## 2022-10-21 LAB — URINE CULTURE: Culture: NO GROWTH

## 2022-10-21 MED ORDER — POTASSIUM CHLORIDE 20 MEQ PO PACK
40.0000 meq | PACK | Freq: Once | ORAL | Status: AC
  Administered 2022-10-21: 40 meq via ORAL
  Filled 2022-10-21: qty 2

## 2022-10-21 MED ORDER — SODIUM CHLORIDE 0.9 % IV SOLN: INTRAVENOUS | Status: AC

## 2022-10-21 MED ORDER — LOSARTAN POTASSIUM 100 MG PO TABS: 50.0000 mg | ORAL_TABLET | Freq: Every day | ORAL | 1 refills | Status: DC

## 2022-10-21 MED ORDER — CEPHALEXIN 500 MG PO CAPS: 500.0000 mg | ORAL_CAPSULE | Freq: Two times a day (BID) | ORAL | 0 refills | Status: DC

## 2022-10-21 MED ORDER — SODIUM CHLORIDE 0.9 % IV SOLN
1.0000 g | INTRAVENOUS | Status: DC
  Administered 2022-10-21: 1 g via INTRAVENOUS
  Filled 2022-10-21 (×2): qty 10

## 2022-10-21 NOTE — Progress Notes (Signed)
Rounded along with NT and noticed pt pulled out PICC line. Pt with AMS, did appear to pick at lines and tubes earlier. Pt noted to me more restless and forgetful, stating, "I don't want any of this". IV team notified and CN as well. SRP, RN

## 2022-10-21 NOTE — Discharge Summary (Addendum)
Physician Discharge Summary   Patient: Michaela Santana MRN: [id number] DOB: 15-Mar-1946  Admit date:     10/16/2022  Discharge date: 10/22/22  Discharge Physician: Meredeth Ide   PCP: Shon Hale, MD   Recommendations at discharge:   Follow-up urology as outpatient Follow-up oncology as outpatient Will discharge with HHPT  Discharge Diagnoses: Principal Problem:   Intractable nausea and vomiting Active Problems:   CHF (congestive heart failure) (HCC)   Urinary tract infection with hematuria   Multiple pulmonary nodules   Type II diabetes mellitus (HCC)   Hyponatremia   Leucocytosis   Diarrhea   Urinary retention   Renal mass  Resolved Problems:   * No resolved hospital problems. *  Hospital Course:  77 year old female with medical history of diabetes mellitus type 2, hypertension, hyperlipidemia, depression, GERD, mild dementia, history of kidney stone was brought to the hospital with abdominal pain, nausea and vomiting. Patient also has been having diarrhea. Apparently many family members were sick at home. Patient started on IV fluids.   Assessment and Plan:  Diarrhea -Presented with diarrhea, -Had black-colored stool ; he does take iron at home -Concern for GI bleed -CTA abdomen/pelvis was negative for GI bleed -H&H has been stable -GI pathogen panel was positive for norovirus -This morning patient was seen by gastroenterology, Dr. Dulce Sellar saw the patient and does not recommend any further interventions.   Hypersomnolence -Patient was very hypersomnolent yesterday -CT head obtained was unremarkable -Urine culture ordered; urine culture came back negative   FOBT positive -FOBT came back positive -Recheck CBC, likely from enteritis -Hemoglobin has been stable at 13.7.  No further episodes of black stool.   Renal mass -Seen on CT abdomen/pelvis, -Discussed with Dr. Mosetta Putt, patient will follow Dr. Arbutus Ped as  outpatient -Patient already seen by urology,  will follow-up urology as outpatient   Urinary retention -Foley catheter was placed for urinary retention -Foley catheter was discontinued on 10/18/2022 for voiding trial -Bladder scan checked today again shows 500 cc of retained urine -Foley catheter was reinserted and urology was consulted -Urology did not feel that patient has urinary retention, recommend to remove Foley when able -Urine culture showed no growth -Patient failed voiding trial 700 cc of urine obtained after In-N-Out cath last night -Will insert Foley catheter and follow-up with urology as outpatient -She was empirically started on Rocephin on 10/21/2022, will discontinue Rocephin as urine culture showed no growth       Diabetes mellitus type 2 -Continue home medications   Acute kidney injury -Likely prerenal, resolved   Hypokalemia -Potassium is 3.4 -Replace potassium before discharge        Consultants: Urology Procedures performed:  Disposition: Home Diet recommendation:  Discharge Diet Orders (From admission, onward)     Start     Ordered   10/21/22 0000  Diet - low sodium heart healthy        10/21/22 1534           Carb modified diet DISCHARGE MEDICATION: Allergies as of 10/22/2022       Reactions   Codeine Hypertension   Alprazolam Other (See Comments)   lethargic        Medication List     TAKE these medications    acetaminophen 650 MG CR tablet Commonly known as: TYLENOL Take 650 mg by mouth every 8 (eight) hours as needed for pain.   albuterol 108 (90 Base) MCG/ACT inhaler Commonly known as: VENTOLIN HFA Inhale into the lungs every 6 (  six) hours as needed for wheezing or shortness of breath.   amLODipine 5 MG tablet Commonly known as: NORVASC Take 5 mg by mouth daily.   calcium carbonate 500 MG chewable tablet Commonly known as: TUMS - dosed in mg elemental calcium Chew 1 tablet by mouth daily.   cyanocobalamin 1000 MCG tablet Commonly known as: VITAMIN B12 Take 1  tablet (1,000 mcg total) by mouth daily.   DULoxetine 30 MG capsule Commonly known as: CYMBALTA Take 30 mg by mouth daily. What changed: Another medication with the same name was removed. Continue taking this medication, and follow the directions you see here.   famotidine 20 MG tablet Commonly known as: PEPCID Take 20 mg by mouth daily.   ferrous sulfate 325 (65 FE) MG EC tablet Take 325 mg by mouth daily.   freestyle lancets   FreeStyle Lite Devi USE UTD   FREESTYLE LITE test strip Generic drug: glucose blood USE TO CHECK FASTING GLUCOSE IN THE MORNING AND 2 HOURS AFTER DINNER OR LUNCH   gabapentin 300 MG capsule Commonly known as: NEURONTIN Take 300 mg by mouth every evening.   glipiZIDE 5 MG 24 hr tablet Commonly known as: GLUCOTROL XL Take 5 mg by mouth daily with breakfast.   latanoprost 0.005 % ophthalmic solution Commonly known as: XALATAN Place 1 drop into the right eye at bedtime.   loratadine 10 MG tablet Commonly known as: CLARITIN Take 10 mg by mouth daily as needed for allergies.   LORazepam 0.5 MG tablet Commonly known as: ATIVAN Take 0.5 mg by mouth every 8 (eight) hours.   losartan 100 MG tablet Commonly known as: COZAAR Take 0.5 tablets (50 mg total) by mouth daily. What changed: how much to take   magnesium oxide 400 (240 Mg) MG tablet Commonly known as: MAG-OX Take 800 mg by mouth 2 (two) times daily.   ondansetron 8 MG tablet Commonly known as: ZOFRAN Take 8 mg by mouth every 8 (eight) hours as needed for nausea/vomiting.   oxymetazoline 0.05 % nasal spray Commonly known as: AFRIN Place 1 spray into both nostrils 2 (two) times daily.   QUEtiapine 25 MG tablet Commonly known as: SEROQUEL Take 12.5 mg by mouth every other day.   SYSTANE CONTACTS OP Apply 0.4 drops to eye as needed.         Follow-up Information     Jerilee Field, MD. Call.   Specialty: Urology Contact information: [address] [place]  [address] [phone]         Shon Hale, MD Follow up in 1 week(s).   Specialty: Family Medicine Why: Follow urine culture result as outpatient Contact information: [address] [place] [address] [phone]         [facility] Follow up.   Specialty: Home Health Services Contact information: [address] [place] [address] [phone]                Discharge Exam: Ceasar Mons Weights   10/16/22 1338  Weight: 83.8 kg   General-appears in no acute distress Heart-S1-S2, regular, no murmur auscultated Lungs-clear to auscultation bilaterally, no wheezing or crackles auscultated Abdomen-soft, nontender, no organomegaly Extremities-no edema in the lower extremities Neuro-alert, oriented x3, no focal deficit noted  Condition at discharge: good  The results of significant diagnostics from this hospitalization (including imaging, microbiology, ancillary and laboratory) are listed below for reference.   Imaging Studies: CT HEAD WO CONTRAST ( )  Result Date: 10/20/2022  CLINICAL DATA:  Altered mental status EXAM: CT HEAD WITHOUT CONTRAST TECHNIQUE: Contiguous axial images were obtained from the base of the skull through the vertex without intravenous contrast. RADIATION DOSE REDUCTION: This exam was performed according to the departmental dose-optimization program which includes automated exposure control, adjustment of the mA and/or kV according to patient size and/or use of iterative reconstruction technique. COMPARISON:  None Available. FINDINGS: Brain: There is no mass, hemorrhage or extra-axial collection. The size and configuration of the ventricles and extra-axial CSF spaces are normal. There is hypoattenuation of the white matter, most commonly indicating chronic small vessel disease. Vascular: No abnormal hyperdensity of the major intracranial arteries or dural venous sinuses. No intracranial atherosclerosis. Skull:  The visualized skull base, calvarium and extracranial soft tissues are normal. Sinuses/Orbits: No fluid levels or advanced mucosal thickening of the visualized paranasal sinuses. No mastoid or middle ear effusion. The orbits are normal. 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 Electronically Signed   By: Deatra Robinson M.D.   On: 10/20/2022 21:50   DG Chest Port 1 View  Result Date: 10/19/2022 CLINICAL DATA:  Status post PICC line EXAM: PORTABLE CHEST 1 VIEW COMPARISON:  Chest x-ray 04/17/2022 FINDINGS: Right upper extremity PICC terminates over the distal SVC. Patient is rotated. The heart size and mediastinal contours are within normal limits. Both lungs are clear. No pneumothorax. The visualized skeletal structures are unremarkable. IMPRESSION: Right upper extremity PICC terminates over the distal SVC. Electronically Signed   By: Darliss Cheney M.D.   On: 10/19/2022 18:10   [place] EKG SITE RITE  Result Date: 10/19/2022 If Site Rite image not attached, placement could not be confirmed due to current cardiac rhythm.  CT ANGIO GI BLEED  Result Date: 10/17/2022 CLINICAL DATA:  Chronic mesenteric ischemia, metastatic neuroendocrine tumor * Tracking Code: BO * EXAM: CTA ABDOMEN AND PELVIS WITHOUT AND WITH CONTRAST TECHNIQUE: Multidetector CT imaging of the abdomen and pelvis was performed using the standard protocol during bolus administration of intravenous contrast. Multiplanar reconstructed images and MIPs were obtained and reviewed to evaluate the vascular anatomy. RADIATION DOSE REDUCTION: This exam was performed according to the departmental dose-optimization program which includes automated exposure control, adjustment of the mA and/or kV according to patient size and/or use of iterative reconstruction technique. CONTRAST:  OMNIPAQUE IOHEXOL 350 MG/ML SOLN COMPARISON:  04/17/2022 FINDINGS: VASCULAR Normal contour and caliber of the abdominal aorta. No evidence  of aneurysm, dissection, or other acute aortic pathology. Mild to moderate mixed calcific atherosclerosis. Small accessory right renal artery with a solitary left renal artery and otherwise standard branching pattern of the abdominal aorta. Scattered atherosclerosis of the branch vessel origins without significant stenosis. In particular, the superior mesenteric artery is widely patent through its first and second order branches. Review of the MIP images confirms the above findings. NON-VASCULAR Lower chest: No acute abnormality. Cardiomegaly. Moderate hernia with intrathoracic position of the gastric fundus. Numerous small nodules in the included bilateral lung bases, largest in the left lung base measuring 1.0 cm (series 6, image 22). Hepatobiliary: No focal liver abnormality is seen. Status post cholecystectomy. No biliary dilatation. Pancreas: Unremarkable. No pancreatic ductal dilatation or surrounding inflammatory changes. Spleen: Normal in size without significant abnormality. Adrenals/Urinary Tract: Adrenal glands are unremarkable. Enhancing mass of the posterior midportion of the left kidney measuring 1.9 x 1.8 cm (series 4, image 79). Bladder is unremarkable. Stomach/Bowel: Proximal stomach is normal. Severe wall thickening and inflammatory fat stranding about the pylorus, duodenum, and proximal  jejunum (series 4, image 69, 96). Diverticulum of the descending duodenum. Appendix appears normal. The colon is fluid-filled to the rectum. Sigmoid diverticula without evidence of diverticulitis. Lymphatic: No enlarged abdominal or pelvic lymph nodes. Reproductive: No mass or other significant abnormality. Other: No abdominal wall hernia or abnormality. No ascites. Musculoskeletal: No acute or significant osseous findings. IMPRESSION: 1. Normal contour and caliber of the abdominal aorta. No evidence of aneurysm, dissection, or other acute aortic pathology. 2. Scattered atherosclerosis of the branch vessel origins  without significant stenosis. In particular, the superior mesenteric artery is widely patent through its first and second order branches. 3. Severe wall thickening and inflammatory fat stranding about the pylorus, duodenal, and proximal jejunum, consistent with nonspecific infectious or inflammatory enteritis. 4. The colon is fluid-filled to the rectum, consistent with diarrheal illness. 5. Enhancing mass of the posterior midportion of the left kidney measuring 1.9 x 1.8 cm, consistent with renal cell carcinoma. No evidence of renal vein invasion, lymphadenopathy, or abdominal metastatic disease. 6. Numerous small nodules in the included bilateral lung bases, consistent with patient's known metastatic neuroendocrine tumor. Aortic Atherosclerosis (ICD10-I70.0). Electronically Signed   By: Jearld Lesch M.D.   On: 10/17/2022 19:44    Microbiology: Results for orders placed or performed during the hospital encounter of 10/16/22  Gastrointestinal Panel by PCR , Stool     Status: Abnormal   Collection Time: 10/19/22  1:00 PM   Specimen: Per Rectum; Stool  Result Value Ref Range Status   Campylobacter species NOT DETECTED NOT DETECTED Final   Plesimonas shigelloides NOT DETECTED NOT DETECTED Final   Salmonella species NOT DETECTED NOT DETECTED Final   Yersinia enterocolitica NOT DETECTED NOT DETECTED Final   Vibrio species NOT DETECTED NOT DETECTED Final   Vibrio cholerae NOT DETECTED NOT DETECTED Final   Enteroaggregative E coli (EAEC) NOT DETECTED NOT DETECTED Final   Enteropathogenic E coli (EPEC) NOT DETECTED NOT DETECTED Final   Enterotoxigenic E coli (ETEC) NOT DETECTED NOT DETECTED Final   Shiga like toxin producing E coli (STEC) NOT DETECTED NOT DETECTED Final   Shigella/Enteroinvasive E coli (EIEC) NOT DETECTED NOT DETECTED Final   Cryptosporidium NOT DETECTED NOT DETECTED Final   Cyclospora cayetanensis NOT DETECTED NOT DETECTED Final   Entamoeba histolytica NOT DETECTED NOT DETECTED Final    Giardia lamblia NOT DETECTED NOT DETECTED Final   Adenovirus F40/41 NOT DETECTED NOT DETECTED Final   Astrovirus NOT DETECTED NOT DETECTED Final   Norovirus GI/GII DETECTED (A) NOT DETECTED Final    Comment: RESULT CALLED TO, READ BACK BY AND VERIFIED WITH: COURTNEY HOLT AT 2153 ON 10/19/22 BY SS    Rotavirus A NOT DETECTED NOT DETECTED Final   Sapovirus (I, II, IV, and V) NOT DETECTED NOT DETECTED Final    Comment: Performed at [facility], [address] [place], [place] [address]  Urine Culture (for pregnant, neutropenic or urologic patients or patients with an indwelling urinary catheter)     Status: None   Collection Time: 10/20/22  1:40 PM   Specimen: Urine, Clean Catch  Result Value Ref Range Status   Specimen Description   Final    URINE, CLEAN CATCH Performed at [facility], [address], [place], [place] [address]    Special Requests   Final    NONE Performed at [facility] [address], [place], [place] [address]    Culture   Final    NO GROWTH Performed at [facility],  [address], [place], [place] [address]    Report Status 10/21/2022 FINAL  Final    Labs: CBC: Recent Labs  Lab 10/16/22 1620 10/17/22 0439 10/17/22 1757 10/19/22 1541 10/22/22 0920  WBC 11.0* 14.3*  --  14.1* 10.9*  NEUTROABS 9.1*  --   --   --   --   HGB 14.3 14.4 14.7 13.7 11.2*  HCT 42.6 43.8 44.1 41.2 34.4*  MCV 97.5 99.8  --  97.4 99.7  PLT 313 328  --  314 237   Basic Metabolic Panel: Recent Labs  Lab 10/16/22 1426 10/17/22 0439 10/18/22 0500 10/18/22 1422 10/19/22 0457 10/20/22 1030 10/21/22 1115 10/22/22 0920  NA 133* 135 132*  --  130* 127* 131* 132*  K 3.8 3.1* 3.0* 2.9* 2.9* 3.1* 3.3* 3.4*  CL 101 106 103  --  102 102 101 103  CO2 20* 19* 19*  --  19* 19* 19* 18*  GLUCOSE 256* 238* 211*  --  153* 148* 177* 175*  BUN 46* 40* 33*  --  32* 19 13 9   CREATININE 1.13* 0.98 0.89  --  0.99 0.79 0.67 0.70   CALCIUM 8.8* 7.9* 7.7*  --  7.6* 7.4* 7.9* 7.5*  MG 1.7 1.5* 1.7  --   --   --   --   --    Liver Function Tests: Recent Labs  Lab 10/16/22 1426 10/17/22 0439 10/22/22 0920  AST 27 17 42*  ALT 24 19 45*  ALKPHOS 72 54 54  BILITOT 0.8 0.7 0.5  PROT 7.1 5.5* 5.3*  ALBUMIN 3.9 3.0* 2.1*   CBG: Recent Labs  Lab 10/21/22 1121 10/21/22 1649 10/21/22 2106 10/22/22 0754 10/22/22 1136  GLUCAP 166* 203* 165* 165* 152*    Discharge time spent: greater than 30 minutes.  Signed: Meredeth Ide, MD [facility] 10/22/2022

## 2022-10-21 NOTE — TOC Initial Note (Signed)
Transition of Care [facility]) - Initial/Assessment Note    Patient Details  Name: Michaela Santana MRN: [id number] Date of Birth: 05/05/1946  Transition of Care [facility]) CM/SW Contact:    Michaela Prows, RN Phone Number: 10/21/2022, 5:11 PM  Clinical Narrative:                 TOC consult for HHPT; pt oriented x 1 to self; spoke w/ pt's dtr Michaela Santana and she accepts service; she says pt is from home and plans for her to return at d/c; she says pt has transportation; pt has reading glasses and bilateral HA; she says pt has cane, walker, shower chair, and grab bars in shower; she also says pt does not have HH services or home oxygen; contacted Michaela Santana and he says agency can provide services; pt's dtr notified and agency contact info placed in follow up provider section of d/c instructions.  -1623- notified by Dr Sharl Ma pt's d/c on hold; notified Michaela Santana at [place] d/c on hold; will pass on to oncoming TOC to follow.  Expected Discharge Plan: Home w Home Health Services Barriers to Discharge: Continued Medical Work up   Patient Goals and CMS Choice Patient states their goals for this hospitalization and ongoing recovery are:: per pt's dtr Michaela Santana home          Expected Discharge Plan and Services   Discharge Planning Services: CM Consult Post Acute Care Choice: Home Health Living arrangements for the past 2 months: Single Family Home Expected Discharge Date: 10/21/22                         HH Arranged: PT HH Agency: [facility] Date [facility] Agency Contacted: 10/21/22 Time HH Agency Contacted: 1618 Representative spoke with at [facility] Agency: Michaela Santana  Prior Living Arrangements/Services Living arrangements for the past 2 months: Single Family Home Lives with:: Adult Children Patient language and need for interpreter reviewed:: Yes Do you feel safe going back to the place where you live?: Yes      Need for Family Participation in Patient Care: Yes (Comment) Care giver  support system in place?: Yes (comment) Current home services: Home PT Criminal Activity/Legal Involvement Pertinent to Current Situation/Hospitalization: No - Comment as needed  Activities of Daily Living Home Assistive Devices/Equipment: Walker (specify type), Eyeglasses, CBG Meter, Blood pressure cuff, Cane (specify quad or straight) ADL Screening (condition at time of admission) Patient's cognitive ability adequate to safely complete daily activities?: Yes Is the patient deaf or have difficulty hearing?: No Does the patient have difficulty seeing, even when wearing glasses/contacts?: No Does the patient have difficulty concentrating, remembering, or making decisions?: Yes Patient able to express need for assistance with ADLs?: Yes Does the patient have difficulty dressing or bathing?: No Independently performs ADLs?: Yes (appropriate for developmental age) Does the patient have difficulty walking or climbing stairs?: No Weakness of Legs: Both Weakness of Arms/Hands: None  Permission Sought/Granted Permission sought to share information with : Case Manager Permission granted to share information with : Yes, Verbal Permission Granted  Share Information with NAME: Michaela Bunting, RN, CM     Permission granted to share info w Relationship: Michaela Santana (dtr) [phone]     Emotional Assessment Appearance:: Appears stated age Attitude/Demeanor/Rapport: Unable to Assess (patient oriented x 1 to self) Affect (typically observed): Unable to Assess Orientation: : Oriented to Self Alcohol / Substance Use: Not Applicable Psych Involvement: No (comment)  Admission diagnosis:  Nausea & vomiting [R11.2] Diarrhea [R19.7] Patient Active Problem List   Diagnosis Date Noted   Diarrhea 10/18/2022   Nausea & vomiting 10/16/2022   Hyponatremia 10/16/2022   Leucocytosis 10/16/2022   Acute encephalopathy 10/26/2021   Hypertension    Type II diabetes mellitus (HCC)    Prolonged QT interval     Atrioventricular block, Mobitz type 1, Wenckebach    Lung cancer, primary, 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, left (HCC) 08/18/2021   Multiple pulmonary nodules 07/19/2021   Intractable nausea and vomiting 06/09/2021   Spondylosis without myelopathy or radiculopathy, lumbar region 10/17/2016   CHF (congestive heart failure) (HCC) 07/17/2016   Sepsis (HCC) 07/17/2016   Influenza 07/17/2016   Congestive heart failure (HCC)    Hypotension    Urinary tract infection with hematuria    PCP:  Shon Hale, MD Pharmacy:   [facility] [phone] [place], [place] - [address] AT [facility] [address] [facility] [address] [place] [address] Phone: [phone] Fax: [phone]     Social Determinants of Health (SDOH) Social History: SDOH Screenings   Food Insecurity: No Food Insecurity (10/21/2022)  Housing: Low Risk  (10/21/2022)  Transportation Needs: No Transportation Needs (10/21/2022)  Utilities: Not At Risk (10/21/2022)  Tobacco Use: Low Risk  (10/19/2022)   SDOH Interventions: Food Insecurity Interventions: Inpatient TOC Housing Interventions: Inpatient TOC Transportation Interventions: Inpatient TOC Utilities Interventions: Inpatient TOC   Readmission Risk Interventions     No data to display

## 2022-10-21 NOTE — Plan of Care (Signed)
  Problem: Clinical Measurements: Goal: Ability to maintain clinical measurements within normal limits will improve Outcome: Progressing Goal: Will remain free from infection Outcome: Progressing   

## 2022-10-22 DIAGNOSIS — R918 Other nonspecific abnormal finding of lung field: Secondary | ICD-10-CM | POA: Diagnosis not present

## 2022-10-22 DIAGNOSIS — R339 Retention of urine, unspecified: Secondary | ICD-10-CM

## 2022-10-22 DIAGNOSIS — E1122 Type 2 diabetes mellitus with diabetic chronic kidney disease: Secondary | ICD-10-CM | POA: Diagnosis not present

## 2022-10-22 DIAGNOSIS — N2889 Other specified disorders of kidney and ureter: Secondary | ICD-10-CM

## 2022-10-22 DIAGNOSIS — R197 Diarrhea, unspecified: Secondary | ICD-10-CM | POA: Diagnosis not present

## 2022-10-22 DIAGNOSIS — R112 Nausea with vomiting, unspecified: Secondary | ICD-10-CM | POA: Diagnosis not present

## 2022-10-22 LAB — COMPREHENSIVE METABOLIC PANEL
ALT: 45 U/L — ABNORMAL HIGH (ref 0–44)
AST: 42 U/L — ABNORMAL HIGH (ref 15–41)
Albumin: 2.1 g/dL — ABNORMAL LOW (ref 3.5–5.0)
Alkaline Phosphatase: 54 U/L (ref 38–126)
Anion gap: 11 (ref 5–15)
BUN: 9 mg/dL (ref 8–23)
CO2: 18 mmol/L — ABNORMAL LOW (ref 22–32)
Calcium: 7.5 mg/dL — ABNORMAL LOW (ref 8.9–10.3)
Chloride: 103 mmol/L (ref 98–111)
Creatinine, Ser: 0.7 mg/dL (ref 0.44–1.00)
GFR, Estimated: >60 mL/min (ref >60)
Glucose, Bld: 175 mg/dL — ABNORMAL HIGH (ref 70–99)
Potassium: 3.4 mmol/L — ABNORMAL LOW (ref 3.5–5.1)
Sodium: 132 mmol/L — ABNORMAL LOW (ref 135–145)
Total Bilirubin: 0.5 mg/dL (ref 0.3–1.2)
Total Protein: 5.3 g/dL — ABNORMAL LOW (ref 6.5–8.1)

## 2022-10-22 LAB — GLUCOSE, CAPILLARY
Glucose-Capillary: 165 mg/dL — ABNORMAL HIGH (ref 70–99)
Glucose-Capillary: 152 mg/dL — ABNORMAL HIGH (ref 70–99)

## 2022-10-22 LAB — CBC
HCT: 34.4 % — ABNORMAL LOW (ref 36.0–46.0)
Hemoglobin: 11.2 g/dL — ABNORMAL LOW (ref 12.0–15.0)
MCH: 32.5 pg (ref 26.0–34.0)
MCHC: 32.6 g/dL (ref 30.0–36.0)
MCV: 99.7 fL (ref 80.0–100.0)
Platelets: 237 10*3/uL (ref 150–400)
RBC: 3.45 MIL/uL — ABNORMAL LOW (ref 3.87–5.11)
RDW: 12.5 % (ref 11.5–15.5)
WBC: 10.9 10*3/uL — ABNORMAL HIGH (ref 4.0–10.5)
nRBC: 0.3 % — ABNORMAL HIGH (ref 0.0–0.2)

## 2022-10-22 MED ORDER — SODIUM CHLORIDE 0.9 % IV SOLN: INTRAVENOUS | Status: DC

## 2022-10-22 MED ORDER — ONDANSETRON HCL 4 MG/2ML IJ SOLN
4.0000 mg | Freq: Four times a day (QID) | INTRAMUSCULAR | Status: DC | PRN
  Administered 2022-10-22: 4 mg via INTRAVENOUS
  Filled 2022-10-22: qty 2

## 2022-10-22 MED ORDER — CHLORHEXIDINE GLUCONATE CLOTH 2 % EX PADS
6.0000 | MEDICATED_PAD | Freq: Every day | CUTANEOUS | Status: DC
  Administered 2022-10-22: 6 via TOPICAL

## 2022-10-22 NOTE — Consult Note (Signed)
[facility] Gastroenterology Consultation Note  Referring Provider: No ref. provider found Primary Care Physician:  Shon Hale, MD Primary Gastroenterologist:  Dr. Charlott Rakes  Reason for Consultation:  blood in stool, anemia  HPI: Michaela Santana is a 77 y.o. female admitted one week of nausea, vomiting, diarrhea.  Found to be positive norovirus.  Has had some dark or green stools; no overt bleeding; but hemoccult-positive.  No abdominal pain.  Endoscopy and colonoscopy January 2023 by Dr. Bosie Clos for evaluation of iron-deficiency anemia.   Past Medical History:  Diagnosis Date   Allergic rhinitis    Anemia    Arthritis    "probably in hands" (07/17/2016)   CKD (chronic kidney disease), stage III (HCC)    Depression    Esophageal reflux    Family history of adverse reaction to anesthesia    "her father had some issues when he had his gallbladder taken out when he was in his late 4s"   GERD (gastroesophageal reflux disease)    Glaucoma    open angle, bilateral   High cholesterol    History of kidney stones    Hypercholesterolemia    Hypertension    Kidney cysts    Lumbago with sciatica, right side    Sepsis (HCC) 07/2016   d/t flu   Type II diabetes mellitus (HCC)    Vitamin D deficiency     Past Surgical History:  Procedure Laterality Date   BREAST BIOPSY  1990   "? side; it wasn't anything"   BRONCHIAL BIOPSY  08/01/2021   Procedure: BRONCHIAL BIOPSIES;  Surgeon: Josephine Igo, DO;  Location: [facility] ENDOS[facility];  Service: Pulmonary;;   BRONCHIAL NEEDLE ASPIRATION BIOPSY  08/01/2021   Procedure: BRONCHIAL NEEDLE ASPIRATION BIOPSIES;  Surgeon: Josephine Igo, DO;  Location: [facility] ENDOSCOPY;  Service: Pulmonary;;   CATARACT EXTRACTION, BILATERAL  2021   CHOLECYSTECTOMY OPEN  1971   COLONOSCOPY     2001,2011,2023   TUBAL LIGATION  1978   VIDEO BRONCHOSCOPY WITH RADIAL ENDOBRONCHIAL ULTRASOUND  08/01/2021   Procedure: RADIAL ENDOBRONCHIAL ULTRASOUND;  Surgeon:  Josephine Igo, DO;  Location: [facility] ENDOSCOPY;  Service: Pulmonary;;    Prior to Admission medications   Medication Sig Start Date End Date Taking? Authorizing Provider  amLODipine (NORVASC) 5 MG tablet Take 5 mg by mouth daily. 01/20/20  Yes [provider]  cephALEXin (KEFLEX) 500 MG capsule Take 1 capsule (500 mg total) by mouth 2 (two) times daily for 5 days. 10/21/22 10/26/22 Yes Meredeth Ide, MD  DULoxetine (CYMBALTA) 30 MG capsule Take 30 mg by mouth daily.   Yes [provider]  DULoxetine HCl 40 MG CPEP Take 40 mg by mouth daily. 06/29/21  Yes [provider]  famotidine (PEPCID) 20 MG tablet Take 20 mg by mouth daily.   Yes [provider]  ferrous sulfate 325 (65 FE) MG EC tablet Take 325 mg by mouth daily.   Yes [provider]  gabapentin (NEURONTIN) 300 MG capsule Take 300 mg by mouth every evening. 01/01/20  Yes [provider]  glipiZIDE (GLUCOTROL XL) 5 MG 24 hr tablet Take 5 mg by mouth daily with breakfast. 08/13/16  Yes [provider]  latanoprost (XALATAN) 0.005 % ophthalmic solution Place 1 drop into the right eye at bedtime. 09/21/22  Yes [provider]  loratadine (CLARITIN) 10 MG tablet Take 10 mg by mouth daily as needed for allergies.   Yes [provider]  LORazepam (ATIVAN) 0.5 MG tablet  Take 0.5 mg by mouth every 8 (eight) hours.   Yes [provider]  magnesium oxide (MAG-OX) 400 (240 Mg) MG tablet Take 800 mg by mouth 2 (two) times daily. 06/23/21  Yes [provider]  QUEtiapine (SEROQUEL) 25 MG tablet Take 12.5 mg by mouth every other day.   Yes [provider]  vitamin B-12 (CYANOCOBALAMIN) 1000 MCG tablet Take 1 tablet (1,000 mcg total) by mouth daily. 10/29/21  Yes Amin, Loura Halt, MD  acetaminophen (TYLENOL) 650 MG CR tablet Take 650 mg by mouth every 8 (eight) hours as needed for pain.    [provider]  albuterol (VENTOLIN HFA) 108 (90 Base)  MCG/ACT inhaler Inhale into the lungs every 6 (six) hours as needed for wheezing or shortness of breath.    [provider]  Artificial Tear Solution (SYSTANE CONTACTS OP) Apply 0.4 drops to eye as needed.    [provider]  Blood Glucose Monitoring Suppl (FREESTYLE LITE) DEVI USE UTD 07/26/16   [provider]  calcium carbonate (TUMS - DOSED IN MG ELEMENTAL CALCIUM) 500 MG chewable tablet Chew 1 tablet by mouth daily.    [provider]  FREESTYLE LITE test strip USE TO CHECK FASTING GLUCOSE IN THE MORNING AND 2 HOURS AFTER DINNER OR LUNCH 07/26/16   [provider]  Lancets (FREESTYLE) lancets  07/26/16   [provider]  losartan (COZAAR) 100 MG tablet Take 0.5 tablets (50 mg total) by mouth daily. 10/21/22   Meredeth Ide, MD  ondansetron (ZOFRAN) 8 MG tablet Take 8 mg by mouth every 8 (eight) hours as needed for nausea/vomiting. 09/14/21   [provider]  oxymetazoline (AFRIN) 0.05 % nasal spray Place 1 spray into both nostrils 2 (two) times daily.    [provider]    Current Facility-Administered Medications  Medication Dose Route Frequency Provider Last Rate Last Admin   0.9 %  sodium chloride infusion   Intravenous Continuous Meredeth Ide, MD 100 mL/hr at 10/22/22 1205 New Bag at 10/22/22 1205   acetaminophen (TYLENOL) tablet 650 mg  650 mg Oral Q6H PRN Meredeth Ide, MD       insulin aspart (novoLOG) injection 0-15 Units  0-15 Units Subcutaneous TID WC Rometta Emery, MD   3 Units at 10/22/22 1205   insulin aspart (novoLOG) injection 0-5 Units  0-5 Units Subcutaneous QHS Earlie Lou L, MD   2 Units at 10/20/22 2238   LORazepam (ATIVAN) tablet 0.5 mg  0.5 mg Oral QHS PRN Meredeth Ide, MD   0.5 mg at 10/20/22 2142   ondansetron (ZOFRAN) injection 4 mg  4 mg Intravenous Q6H PRN Meredeth Ide, MD   4 mg at 10/22/22 0903   promethazine (PHENERGAN) 12.5 mg in sodium chloride 0.9 % 50 mL IVPB  12.5 mg Intravenous Q6H  PRN Terald Sleeper, MD   Stopped at 10/18/22 2241   promethazine (PHENERGAN) tablet 12.5 mg  12.5 mg Oral Q6H PRN Earlie Lou L, MD   12.5 mg at 10/19/22 2125   QUEtiapine (SEROQUEL) tablet 12.5 mg  12.5 mg Oral QHS PRN Meredeth Ide, MD   12.5 mg at 10/21/22 2158   sodium chloride flush (NS) 0.9 % injection 10-40 mL  10-40 mL Intracatheter Q12H Meredeth Ide, MD   10 mL at 10/22/22 0937   sodium chloride flush (NS) 0.9 % injection 10-40 mL  10-40 mL Intracatheter PRN Meredeth Ide, MD  Allergies as of 10/16/2022 - Review Complete 10/16/2022  Allergen Reaction Noted   Codeine Hypertension 07/17/2016   Alprazolam Other (See Comments) 02/15/2017    Family History  Problem Relation Age of Onset   Dementia Mother    Diabetes Mother    Stroke Father    Hypertension Father    CVA Father    Diabetes Sister    Hypertension Sister    Heart disease Paternal Grandfather    Breast cancer Neg Hx     Social History   Socioeconomic History   Marital status: Widowed    Spouse name: Not on file   Number of children: 2   Years of education: Not on file   Highest education level: Some college, no degree  Occupational History   Occupation: Geophysicist/field seismologist  Tobacco Use   Smoking status: Never   Smokeless tobacco: Never  Vaping Use   Vaping Use: Never used  Substance and Sexual Activity   Alcohol use: Never   Drug use: Never   Sexual activity: Not on file  Other Topics Concern   Not on file  Social History Narrative   02/08/20 Lives alone   caffeine soda 1-2 daily   Social Determinants of Health   Financial Resource Strain: Not on file  Food Insecurity: No Food Insecurity (10/21/2022)   Hunger Vital Sign    Worried About Running Out of Food in the Last Year: Never true    Ran Out of Food in the Last Year: Never true  Transportation Needs: No Transportation Needs (10/21/2022)   PRAPARE - Administrator, Civil Service (Medical): No    Lack of Transportation  (Non-Medical): No  Physical Activity: Not on file  Stress: Not on file  Social Connections: Not on file  Intimate Partner Violence: Not At Risk (10/21/2022)   Humiliation, Afraid, Rape, and Kick questionnaire    Fear of Current or Ex-Partner: No    Emotionally Abused: No    Physically Abused: No    Sexually Abused: No    Review of Systems: As per HPI, all others negative  Physical Exam: Vital signs in last 24 hours: Temp:  [97.6 F (36.4 C)-98.6 F (37 C)] 98.6 F (37 C) (05/13 1403) Pulse Rate:  [70-73] 72 (05/13 1403) Resp:  [16-17] 16 (05/13 1403) BP: (140-152)/(60-83) 140/60 (05/13 1403) SpO2:  [100 %] 100 % (05/13 1403) Last BM Date : 10/21/22 General:   Alert, overweight, deconditioned, weak-appearing but not acutely toxic Head:  Normocephalic and atraumatic. Eyes:  Sclera clear, no icterus.   Conjunctiva pink. Ears:  Normal auditory acuity. Nose:  No deformity, discharge,  or lesions. Mouth:  No deformity or lesions.  Oropharynx pink but dry Neck:  Supple; no masses or thyromegaly. Lungs:  No respiratory distress Abdomen:  Soft, protuberant, nontender and nondistended. No masses, hepatosplenomegaly or hernias noted. Normal bowel sounds, without guarding, and without rebound.     Msk:  Symmetrical without gross deformities. Normal posture. Pulses:  Normal pulses noted. Extremities:  Without clubbing or edema. Neurologic:  Alert and  oriented x4;  diffusely weak, otherwise grossly normal neurologically. Skin:  Intact without significant lesions or rashes. Psych:  Alert and cooperative. Normal mood and affect.   Lab Results: Recent Labs    10/19/22 1541 10/22/22 0920  WBC 14.1* 10.9*  HGB 13.7 11.2*  HCT 41.2 34.4*  PLT 314 237   BMET Recent Labs    10/20/22 1030 10/21/22 1115 10/22/22 0920  NA 127* 131* 132*  K 3.1* 3.3* 3.4*  CL 102 101 103  CO2 19* 19* 18*  GLUCOSE 148* 177* 175*  BUN 19 13 9   CREATININE 0.79 0.67 0.70  CALCIUM 7.4* 7.9* 7.5*    LFT Recent Labs    10/22/22 0920  PROT 5.3*  ALBUMIN 2.1*  AST 42*  ALT 45*  ALKPHOS 54  BILITOT 0.5   PT/INR No results for input(s): "LABPROT", "INR" in the last 72 hours.  Studies/Results: No results found.  Impression:   Dark/green stools.  Hemoccult-positive. Mild anemia. Nausea, vomiting, diarrhea,  + Norovirus.  Plan:   Do not suspect patient has ongoing significant GI bleed.  Had endoscopy and colonoscopy in January 2023 by Dr. 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 In absence of overt destabilizing bleeding (which fortunately patient does not have), I would not recommend any type of endoscopy/colonoscopy procedure at this time. Medical management of norovirus; fluids, antiemetics, slowly advance diet as tolerated, OOBTC/PT as tolerated. Enid Baas will sign-off; please call with questions; patient can follow-up with Dr. Bosie Clos if desired by patient or primary team upon discharge, otherwise can follow-up with [place] on prn basis.   LOS: 4 days   Ogechi Kuehnel M  10/22/2022, 2:34 PM  Cell [phone] If no answer or after 5 PM call [phone]

## 2022-10-22 NOTE — TOC Transition Note (Signed)
Transition of Care [facility]) - CM/SW Discharge Note  Patient Details  Name: Michaela Santana MRN: [id number] Date of Birth: Jul 03, 1945  [facility]) CM/SW Contact:  Ewing Schlein, LCSW Phone Number: 10/22/2022, 9:56 AM  Clinical Narrative: CSW notified Cindie with [facility] of discharge. TOC signing off.  Final next level of care: Home w Home Health Services Barriers to Discharge: Barriers Resolved  Patient Goals and CMS Choice CMS Medicare.gov Compare Post Acute Care list provided to:: Patient Represenative (must comment) Choice offered to / list presented to : Adult Children  Discharge Plan and Services Additional resources added to the After Visit Summary for   Discharge Planning Services: CM Consult Post Acute Care Choice: Home Health          DME Arranged: N/A DME Agency: NA HH Arranged: PT HH Agency: [facility] Date [facility] Agency Contacted: 10/21/22 Time HH Agency Contacted: 1618 Representative spoke with at [facility] Agency: Kandee Keen  Social Determinants of Health (SDOH) Interventions SDOH Screenings   Food Insecurity: No Food Insecurity (10/21/2022)  Housing: Low Risk  (10/21/2022)  Transportation Needs: No Transportation Needs (10/21/2022)  Utilities: Not At Risk (10/21/2022)  Tobacco Use: Low Risk  (10/19/2022)   Readmission Risk Interventions     No data to display

## 2022-10-22 NOTE — Progress Notes (Signed)
Pt and daughter given d/c instructions and thorough teachings on emptying foley. All questions answered. Foley remained in place for outpatient follow up. Pt was taken to front entrance via wheelchair to meet ride.

## 2022-10-22 NOTE — Care Management Important Message (Signed)
Important Message  Patient Details IM Letter given. Name: Michaela Santana MRN: [id number] Date of Birth: 1945/06/24   Medicare Important Message Given:  Yes     [name] 10/22/2022, 12:19 PM

## 2022-10-22 NOTE — Progress Notes (Signed)
Physical Therapy Treatment Patient Details Name: Michaela Santana MRN: [id number] DOB: 1946/03/13 Today's Date: 10/22/2022   History of Present Illness 77 yo female brought to the hospital with abdominal pain, nausea and vomiting. Pt admitted with intractable nausea and vomiting. PMH: diabetes mellitus type 2, HTN, hyperlipidemia, depression, GERD, mild dementia, kidney stone    PT Comments    Pt in bed with Daughter at bedside with Pt difficult to arouse.  VERY sleepy/groggy with eyes shut most of session.  Assisted OOB to [facility] was difficult.  General bed mobility comments: required Max Assist due to lethargy/groggy with brief moments of arousal to name.  General transfer comment: first asissted from bed to [facility] required Mod Asisst to rise and Max Asisst to safely complete 1/4 turn.  "rag doll" cognition. Required Max Assist for peri care.  Diarrhea NOT resloved.  Approx 500 cc watery DARK greenish stool in BSC.  No urine as pt has a Cath. General Gait Details: VERY limited amb distance due to level of lethargy/groggy state requiring Max encouragement.  Daughter present and following with recliner.  Staggering/stumbling.  HIGH FALL RISK. Positioned in recliner with pillows.  Pt back to sleep. Pt lives with daughter and plans to return when medically stable.   Recommendations for follow up therapy are one component of a multi-disciplinary discharge planning process, led by the attending physician.  Recommendations may be updated based on patient status, additional functional criteria and insurance authorization.  Follow Up Recommendations       Assistance Recommended at Discharge Frequent or constant Supervision/Assistance  Patient can return home with the following A little help with walking and/or transfers;A little help with bathing/dressing/bathroom;Assistance with cooking/housework;Assist for transportation   Equipment Recommendations  None recommended by PT    Recommendations for Other  Services       Precautions / Restrictions Precautions Precaution Comments: Enteric Restrictions Weight Bearing Restrictions: No     Mobility  Bed Mobility Overal bed mobility: Needs Assistance Bed Mobility: Supine to Sit     Supine to sit: Max assist     General bed mobility comments: required Max Assist due to lethargy/groggy with brief moments of arousal to name    Transfers Overall transfer level: Needs assistance Equipment used: Rolling walker (2 wheels) Transfers: Sit to/from Stand, Bed to chair/wheelchair/BSC Sit to Stand: Mod assist Stand pivot transfers: Max assist         General transfer comment: first asissted from bed to [facility] required Mod Asisst to rise and Max Asisst to safely complete 1/4 turn.  "rag doll" cognition. Required Max Assist for peri care.  Diarrhea NOT resloved.  Approx 500 cc watery DARK greenish stool in BSC.  No urine as pt has a Cath.    Ambulation/Gait Ambulation/Gait assistance: Max assist, +2 physical assistance, +2 safety/equipment Gait Distance (Feet): 14 Feet Assistive device: Rolling walker (2 wheels) Gait Pattern/deviations: Step-through pattern, Staggering left, Staggering right, Trunk flexed, Shuffle Gait velocity: decreased     General Gait Details: VERY limited amb distance due to level of lethargy/groggy state requiring Max encouragement.  Daughter present and following with recliner.  Staggering/stumbling.  HIGH FALL RISK.   Stairs             Wheelchair Mobility    Modified Rankin (Stroke Patients Only)       Balance  Cognition Arousal/Alertness: Lethargic                                     General Comments: lethargic/groggy with brief moments of arousal        Exercises      General Comments        Pertinent Vitals/Pain Pain Assessment Pain Assessment: No/denies pain    Home Living                           Prior Function            PT Goals (current goals can now be found in the care plan section) Progress towards PT goals: Progressing toward goals    Frequency    Min 1X/week      PT Plan Current plan remains appropriate    Co-evaluation              AM-PAC PT "6 Clicks" Mobility   Outcome Measure  Help needed turning from your back to your side while in a flat bed without using bedrails?: A Lot Help needed moving from lying on your back to sitting on the side of a flat bed without using bedrails?: A Lot Help needed moving to and from a bed to a chair (including a wheelchair)?: A Lot Help needed standing up from a chair using your arms (e.g., wheelchair or bedside chair)?: A Lot Help needed to walk in hospital room?: Total   6 Click Score: 9    End of Session Equipment Utilized During Treatment: Gait belt Activity Tolerance: Patient tolerated treatment well Patient left: in chair;with chair alarm set;with family/visitor present;with call bell/phone within reach Nurse Communication: Mobility status PT Visit Diagnosis: Other abnormalities of gait and mobility (R26.89);Muscle weakness (generalized) (M62.81)     Time: 1610-9604 PT Time Calculation (min) (ACUTE ONLY): 27 min  Charges:  $Gait Training: 8-22 mins $Therapeutic Activity: 8-22 mins                     Felecia Shelling  PTA Acute  Rehabilitation Services Office M-F          [phone]

## 2022-10-22 NOTE — Progress Notes (Signed)
Pt's bladder scan shows 554 at this time, in and out done with urine output at 700 ml. Pericare given.

## 2022-10-22 NOTE — Progress Notes (Addendum)
[facility]  PROGRESS NOTE  Michaela Santana GNF:[id number] DOB: [birth year] DOA: 10/16/2022 PCP: Shon Hale, MD   Brief HPI:   77 year old female with medical history of diabetes mellitus type 2, hypertension, hyperlipidemia, depression, GERD, mild dementia, history of kidney stone was brought to the hospital with abdominal pain, nausea and vomiting.  Patient also has been having diarrhea.  Apparently many family members were sick at home.  Patient started on IV fluids.   Subjective   Patient developed urinary retention, last night she was unable to urinate.  In and out cath obtained 700 cc of urine.  She continues to have diarrhea.   Assessment/Plan:    Diarrhea -Had black-colored/greenish stool -FOBT is positive, GI pathogen panel positive for norovirus -Concern for GI bleed -CTA abdomen/pelvis was negative for GI bleed -H&H has been stable -Continue normal saline at 100 mL/h -Will consult gastroenterology  Hypersomnolence -Patient was hypersomnolent intermittently in the hospital -CT head ruled out acute intracranial pathology -Urine culture showed no growth  FOBT positive -FOBT came back positive -Recheck CBC, likely from enteritis -Hemoglobin is not dropping, at 11.2 -Will consult gastroenterology  Renal mass -Seen on CT abdomen/pelvis, -Discussed with Dr. Mosetta Putt, patient will follow Dr. Arbutus Ped as  outpatient -Patient already seen by urology, will follow-up urology as outpatient  Urinary retention -Foley catheter was placed for urinary retention -Foley catheter was discontinued on 10/18/2022 for voiding trial -Bladder scan checked today again shows 500 cc of retained urine -Foley catheter was reinserted and urology was consulted -Urology did not feel that patient has urinary retention, recommend to remove Foley when able -Urine culture showed no growth -Patient failed voiding trial 700 cc of urine obtained after In-N-Out cath last night -Will insert  Foley catheter and follow-up with urology as outpatient -She was empirically started on Rocephin on 10/21/2022, will discontinue Rocephin as urine culture showed no growth   Diabetes mellitus type 2 -Continue sliding scale insulin with NovoLog -CBG well-controlled  Acute kidney injury -Likely prerenal, resolved  Hypokalemia -Potassium is 3.4 this morning -Replace potassium and follow BMP in am  Medications     insulin aspart  0-15 Units Subcutaneous TID WC   insulin aspart  0-5 Units Subcutaneous QHS   sodium chloride flush  10-40 mL Intracatheter Q12H     Data Reviewed:   CBG:  Recent Labs  Lab 10/21/22 0714 10/21/22 1121 10/21/22 1649 10/21/22 2106 10/22/22 0754  GLUCAP 198* 166* 203* 165* 165*    SpO2: 100 %    Vitals:   10/21/22 0551 10/21/22 1124 10/21/22 2030 10/22/22 0447  BP: 136/74 (!) 169/72 (!) 152/83 (!) 144/61  Pulse: 78 70 70 73  Resp: 20 17 16 17   Temp: 97.8 F (36.6 C) 98.5 F (36.9 C) 97.6 F (36.4 C) 98.4 F (36.9 C)  TempSrc: Oral Oral Oral   SpO2: 98% 99% 100% 100%  Weight:      Height:          Data Reviewed:  Basic Metabolic Panel: Recent Labs  Lab 10/16/22 1426 10/17/22 0439 10/18/22 0500 10/18/22 1422 10/19/22 0457 10/20/22 1030 10/21/22 1115  NA 133* 135 132*  --  130* 127* 131*  K 3.8 3.1* 3.0* 2.9* 2.9* 3.1* 3.3*  CL 101 106 103  --  102 102 101  CO2 20* 19* 19*  --  19* 19* 19*  GLUCOSE 256* 238* 211*  --  153* 148* 177*  BUN 46* 40* 33*  --  32* 19 13  CREATININE 1.13* 0.98 0.89  --  0.99 0.79 0.67  CALCIUM 8.8* 7.9* 7.7*  --  7.6* 7.4* 7.9*  MG 1.7 1.5* 1.7  --   --   --   --     CBC: Recent Labs  Lab 10/16/22 1620 10/17/22 0439 10/17/22 1757 10/19/22 1541  WBC 11.0* 14.3*  --  14.1*  NEUTROABS 9.1*  --   --   --   HGB 14.3 14.4 14.7 13.7  HCT 42.6 43.8 44.1 41.2  MCV 97.5 99.8  --  97.4  PLT 313 328  --  314    LFT Recent Labs  Lab 10/16/22 1426 10/17/22 0439  AST 27 17  ALT 24 19   ALKPHOS 72 54  BILITOT 0.8 0.7  PROT 7.1 5.5*  ALBUMIN 3.9 3.0*     Antibiotics: Anti-infectives (From admission, onward)    Start     Dose/Rate Route Frequency Ordered Stop   10/21/22 1800  cefTRIAXone (ROCEPHIN) 1 g in sodium chloride 0.9 % 100 mL IVPB        1 g 200 mL/hr over 30 Minutes Intravenous Every 24 hours 10/21/22 1711     10/21/22 0000  cephALEXin (KEFLEX) 500 MG capsule        500 mg Oral 2 times daily 10/21/22 1541 10/26/22 2359        DVT prophylaxis: Lovenox  Code Status: Full code  Family Communication: Discussed with patient's daughter at bedside   CONSULTS    Objective    Physical Examination:   General-appears in no acute distress Heart-S1-S2, regular, no murmur auscultated Lungs-clear to auscultation bilaterally, no wheezing or crackles auscultated Abdomen-soft, nontender, no organomegaly Extremities-no edema in the lower extremities Neuro-alert, oriented x3, no focal deficit noted   Status is: Inpatient:             Meredeth Ide   [facility] If 7PM-7AM, please contact night-coverage at [email or web address], Office  [phone]   10/22/2022, 9:12 AM  LOS: 4 days

## 2022-10-22 NOTE — Progress Notes (Signed)
  Subjective: 77 year old female admitted for abdominal pain, N/V, and diarrhea. Incidental finding of renal mass concerning for renal cell carcinoma and suspected urinary retention  5/13: No acute events overnight.  Patient was accompanied by her daughter on rounds this morning.  We again reviewed the plan going forward.  Catheter was removed over the weekend and patient has not had any sensation of urinary retention with good UOP.  Objective: Vital signs in last 24 hours: Temp:  [97.6 F (36.4 C)-98.5 F (36.9 C)] 98.4 F (36.9 C) (05/13 0447) Pulse Rate:  [70-73] 73 (05/13 0447) Resp:  [16-17] 17 (05/13 0447) BP: (144-169)/(61-83) 144/61 (05/13 0447) SpO2:  [99 %-100 %] 100 % (05/13 0447)  Intake/Output from previous day: 05/12 0701 - 05/13 0700 In: 1791.8 [P.O.:540; I.V.:1151.7; IV Piggyback:100.1] Out: 900 [Urine:900]  Intake/Output this shift: No intake/output data recorded.  Physical Exam:  General: Alert and oriented CV: No cyanosis Lungs: equal chest rise Abdomen: Soft, NTND, no rebound or guarding Skin: Gu: foley removed  Lab Results: Recent Labs    10/19/22 1541  HGB 13.7  HCT 41.2   BMET Recent Labs    10/20/22 1030 10/21/22 1115  NA 127* 131*  K 3.1* 3.3*  CL 102 101  CO2 19* 19*  GLUCOSE 148* 177*  BUN 19 13  CREATININE 0.79 0.67  CALCIUM 7.4* 7.9*     Studies/Results: CT HEAD WO CONTRAST ( )  Result Date: 10/20/2022 CLINICAL DATA:  Altered mental status EXAM: CT HEAD WITHOUT CONTRAST TECHNIQUE: Contiguous axial images were obtained from the base of the skull through the vertex without intravenous contrast. RADIATION DOSE REDUCTION: This exam was performed according to the departmental dose-optimization program which includes automated exposure control, adjustment of the mA and/or kV according to patient size and/or use of iterative reconstruction technique. COMPARISON:  None Available. FINDINGS: Brain: There is no mass, hemorrhage or  extra-axial collection. The size and configuration of the ventricles and extra-axial CSF spaces are normal. There is hypoattenuation of the white matter, most commonly indicating chronic small vessel disease. Vascular: No abnormal hyperdensity of the major intracranial arteries or dural venous sinuses. No intracranial atherosclerosis. Skull: The visualized skull base, calvarium and extracranial soft tissues are normal. Sinuses/Orbits: No fluid levels or advanced mucosal thickening of the visualized paranasal sinuses. No mastoid or middle ear effusion. The orbits are normal. 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 Electronically Signed   By: Deatra Robinson M.D.   On: 10/20/2022 21:50    Assessment/Plan: # Urinary retention Likely spurious result with ultrasound picking up liquid stool in addition to bladder contents.  Volume in Foley was significantly less than bladder scan.  Catheter removed over the weekend and tolerated well with no suspicion of urinary retention.  Renal indices have remained stable.  # Renal mass Patient information has already been provided to our schedulers.  Patient will follow-up closely for consideration of partial versus radical nephrectomy or possible ablation.  Urology will sign off.  Please feel free to contact with any questions, concerns, or acute urologic needs.   LOS: 4 days   Elmon Kirschner, NP [facility] Pager: [phone]  10/22/2022, 8:34 AM

## 2022-10-23 ENCOUNTER — Telehealth: Payer: Self-pay | Admitting: Medical Oncology

## 2022-10-23 DIAGNOSIS — R918 Other nonspecific abnormal finding of lung field: Secondary | ICD-10-CM | POA: Diagnosis not present

## 2022-10-23 DIAGNOSIS — I110 Hypertensive heart disease with heart failure: Secondary | ICD-10-CM | POA: Diagnosis not present

## 2022-10-23 DIAGNOSIS — Z9181 History of falling: Secondary | ICD-10-CM | POA: Diagnosis not present

## 2022-10-23 DIAGNOSIS — N390 Urinary tract infection, site not specified: Secondary | ICD-10-CM | POA: Diagnosis not present

## 2022-10-23 DIAGNOSIS — I509 Heart failure, unspecified: Secondary | ICD-10-CM | POA: Diagnosis not present

## 2022-10-23 DIAGNOSIS — E785 Hyperlipidemia, unspecified: Secondary | ICD-10-CM | POA: Diagnosis not present

## 2022-10-23 DIAGNOSIS — E876 Hypokalemia: Secondary | ICD-10-CM | POA: Diagnosis not present

## 2022-10-23 DIAGNOSIS — Z7984 Long term (current) use of oral hypoglycemic drugs: Secondary | ICD-10-CM | POA: Diagnosis not present

## 2022-10-23 DIAGNOSIS — I700 Atherosclerosis of aorta: Secondary | ICD-10-CM | POA: Diagnosis not present

## 2022-10-23 DIAGNOSIS — Z87442 Personal history of urinary calculi: Secondary | ICD-10-CM | POA: Diagnosis not present

## 2022-10-23 DIAGNOSIS — E119 Type 2 diabetes mellitus without complications: Secondary | ICD-10-CM | POA: Diagnosis not present

## 2022-10-23 DIAGNOSIS — F32A Depression, unspecified: Secondary | ICD-10-CM | POA: Diagnosis not present

## 2022-10-23 DIAGNOSIS — K219 Gastro-esophageal reflux disease without esophagitis: Secondary | ICD-10-CM | POA: Diagnosis not present

## 2022-10-23 DIAGNOSIS — Z79899 Other long term (current) drug therapy: Secondary | ICD-10-CM | POA: Diagnosis not present

## 2022-10-23 DIAGNOSIS — F03A3 Unspecified dementia, mild, with mood disturbance: Secondary | ICD-10-CM | POA: Diagnosis not present

## 2022-10-23 DIAGNOSIS — E871 Hypo-osmolality and hyponatremia: Secondary | ICD-10-CM | POA: Diagnosis not present

## 2022-10-23 DIAGNOSIS — N2889 Other specified disorders of kidney and ureter: Secondary | ICD-10-CM | POA: Diagnosis not present

## 2022-10-23 NOTE — Telephone Encounter (Signed)
  FYI- Dtr states pt saw Urology ,Dr Tressie Stalker, for "new mass in kidney". She was will be f/u by urology.

## 2022-10-25 DIAGNOSIS — E876 Hypokalemia: Secondary | ICD-10-CM | POA: Diagnosis not present

## 2022-10-25 DIAGNOSIS — I110 Hypertensive heart disease with heart failure: Secondary | ICD-10-CM | POA: Diagnosis not present

## 2022-10-25 DIAGNOSIS — F03A3 Unspecified dementia, mild, with mood disturbance: Secondary | ICD-10-CM | POA: Diagnosis not present

## 2022-10-25 DIAGNOSIS — E871 Hypo-osmolality and hyponatremia: Secondary | ICD-10-CM | POA: Diagnosis not present

## 2022-10-25 DIAGNOSIS — N390 Urinary tract infection, site not specified: Secondary | ICD-10-CM | POA: Diagnosis not present

## 2022-10-25 DIAGNOSIS — F32A Depression, unspecified: Secondary | ICD-10-CM | POA: Diagnosis not present

## 2022-10-29 DIAGNOSIS — R339 Retention of urine, unspecified: Secondary | ICD-10-CM | POA: Diagnosis not present

## 2022-10-29 DIAGNOSIS — Z79899 Other long term (current) drug therapy: Secondary | ICD-10-CM | POA: Diagnosis not present

## 2022-10-29 DIAGNOSIS — R410 Disorientation, unspecified: Secondary | ICD-10-CM | POA: Diagnosis not present

## 2022-10-30 DIAGNOSIS — E871 Hypo-osmolality and hyponatremia: Secondary | ICD-10-CM | POA: Diagnosis not present

## 2022-10-30 DIAGNOSIS — E876 Hypokalemia: Secondary | ICD-10-CM | POA: Diagnosis not present

## 2022-10-30 DIAGNOSIS — F0390 Unspecified dementia without behavioral disturbance: Secondary | ICD-10-CM | POA: Diagnosis not present

## 2022-10-30 DIAGNOSIS — N390 Urinary tract infection, site not specified: Secondary | ICD-10-CM | POA: Diagnosis not present

## 2022-10-30 DIAGNOSIS — F03A3 Unspecified dementia, mild, with mood disturbance: Secondary | ICD-10-CM | POA: Diagnosis not present

## 2022-10-30 DIAGNOSIS — F32A Depression, unspecified: Secondary | ICD-10-CM | POA: Diagnosis not present

## 2022-10-30 DIAGNOSIS — I110 Hypertensive heart disease with heart failure: Secondary | ICD-10-CM | POA: Diagnosis not present

## 2022-11-01 DIAGNOSIS — F03A3 Unspecified dementia, mild, with mood disturbance: Secondary | ICD-10-CM | POA: Diagnosis not present

## 2022-11-01 DIAGNOSIS — F32A Depression, unspecified: Secondary | ICD-10-CM | POA: Diagnosis not present

## 2022-11-01 DIAGNOSIS — I110 Hypertensive heart disease with heart failure: Secondary | ICD-10-CM | POA: Diagnosis not present

## 2022-11-01 DIAGNOSIS — E871 Hypo-osmolality and hyponatremia: Secondary | ICD-10-CM | POA: Diagnosis not present

## 2022-11-01 DIAGNOSIS — R8271 Bacteriuria: Secondary | ICD-10-CM | POA: Diagnosis not present

## 2022-11-01 DIAGNOSIS — N390 Urinary tract infection, site not specified: Secondary | ICD-10-CM | POA: Diagnosis not present

## 2022-11-01 DIAGNOSIS — E876 Hypokalemia: Secondary | ICD-10-CM | POA: Diagnosis not present

## 2022-11-01 DIAGNOSIS — R338 Other retention of urine: Secondary | ICD-10-CM | POA: Diagnosis not present

## 2022-11-03 NOTE — Progress Notes (Unsigned)
[facility] OFFICE PROGRESS NOTE  Shon Hale, MD [address] [place] [address]  DIAGNOSIS:  1) Stage IV (T1b, N0, M1a) low-grade neuroendocrine carcinoma (carcinoid tumor), presented with innumerable bilateral pulmonary nodules diagnosed in March 2023.  2) left renal lesion suspicious for renal cell carcinoma, seen on imaging on 10/17/22   PRIOR THERAPY: None  CURRENT THERAPY: Observation  INTERVAL HISTORY: Michaela Santana 77 y.o. female returns to the clinic today for a follow up visit accompanied by her daughter. 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 She was last seen by Dr. Arbutus Ped regarding this in March 2024 who recommended 6 month follow up. In the interval since last being seen, the patient presented to the ER for diarrhea. Work up found she was positive for norovirus. While admitted, she had a CT which showed a new left renal mass. She was then seen by Dr. Alvera Novel outpatient from urology who elected ***.   Since last being seen, she is feeling ***. Her diarrhea has ***. She denies fever, chills, night sweats, lymphadenopathy, or weight loss. She denied having any current chest pain, shortness of breath, cough or hemoptysis. She has no nausea, vomiting, diarrhea or constipation. She has no headache or visual changes. Abdominal pain? Back pain? Hematuria? She is here for revaluation and a more detailed discussion about her current condition and recommended treatment.   MEDICAL HISTORY: Past Medical History:  Diagnosis Date   Allergic rhinitis    Anemia    Arthritis    "probably in hands" (07/17/2016)   CKD (chronic kidney disease), stage III (HCC)    Depression    Esophageal reflux    Family history of adverse reaction to anesthesia    "her father had some issues when he had his gallbladder taken out when he was in his late 24s"   GERD (gastroesophageal reflux disease)     Glaucoma    open angle, bilateral   High cholesterol    History of kidney stones    Hypercholesterolemia    Hypertension    Kidney cysts    Lumbago with sciatica, right side    Sepsis (HCC) 07/2016   d/t flu   Type II diabetes mellitus (HCC)    Vitamin D deficiency     ALLERGIES:  is allergic to codeine and alprazolam.  MEDICATIONS:  Current Outpatient Medications  Medication Sig Dispense Refill   acetaminophen (TYLENOL) 650 MG CR tablet Take 650 mg by mouth every 8 (eight) hours as needed for pain.     albuterol (VENTOLIN HFA) 108 (90 Base) MCG/ACT inhaler Inhale into the lungs every 6 (six) hours as needed for wheezing or shortness of breath.     amLODipine (NORVASC) 5 MG tablet Take 5 mg by mouth daily.     Artificial Tear Solution (SYSTANE CONTACTS OP) Apply 0.4 drops to eye as needed.     Blood Glucose Monitoring Suppl (FREESTYLE LITE) DEVI USE UTD  0   calcium carbonate (TUMS - DOSED IN MG ELEMENTAL CALCIUM) 500 MG chewable tablet Chew 1 tablet by mouth daily.     DULoxetine (CYMBALTA) 30 MG capsule Take 30 mg by mouth daily.     famotidine (PEPCID) 20 MG tablet Take 20 mg by mouth daily.     ferrous sulfate 325 (65 FE) MG EC tablet Take 325 mg by mouth daily.     FREESTYLE LITE test strip USE TO CHECK FASTING GLUCOSE  IN THE MORNING AND 2 HOURS AFTER DINNER OR LUNCH  3   gabapentin (NEURONTIN) 300 MG capsule Take 300 mg by mouth every evening.     glipiZIDE (GLUCOTROL XL) 5 MG 24 hr tablet Take 5 mg by mouth daily with breakfast.     Lancets (FREESTYLE) lancets      latanoprost (XALATAN) 0.005 % ophthalmic solution Place 1 drop into the right eye at bedtime.     loratadine (CLARITIN) 10 MG tablet Take 10 mg by mouth daily as needed for allergies.     LORazepam (ATIVAN) 0.5 MG tablet Take 0.5 mg by mouth every 8 (eight) hours.     losartan (COZAAR) 100 MG tablet Take 0.5 tablets (50 mg total) by mouth daily. 30 tablet 1   magnesium oxide (MAG-OX) 400 (240 Mg) MG tablet  Take 800 mg by mouth 2 (two) times daily.     ondansetron (ZOFRAN) 8 MG tablet Take 8 mg by mouth every 8 (eight) hours as needed for nausea/vomiting.     oxymetazoline (AFRIN) 0.05 % nasal spray Place 1 spray into both nostrils 2 (two) times daily.     QUEtiapine (SEROQUEL) 25 MG tablet Take 12.5 mg by mouth every other day.     vitamin B-12 (CYANOCOBALAMIN) 1000 MCG tablet Take 1 tablet (1,000 mcg total) by mouth daily. 30 tablet 0   No current facility-administered medications for this visit.    SURGICAL HISTORY:  Past Surgical History:  Procedure Laterality Date   BREAST BIOPSY  1990   "? side; it wasn't anything"   BRONCHIAL BIOPSY  08/01/2021   Procedure: BRONCHIAL BIOPSIES;  Surgeon: Josephine Igo, DO;  Location: [facility];  Service: Pulmonary;;   BRONCHIAL NEEDLE ASPIRATION BIOPSY  08/01/2021   Procedure: BRONCHIAL NEEDLE ASPIRATION BIOPSIES;  Surgeon: Josephine Igo, DO;  Location: [facility];  Service: Pulmonary;;   CATARACT EXTRACTION, BILATERAL  2021   CHOLECYSTECTOMY OPEN  1971   COLONOSCOPY     2001,2011,2023   TUBAL LIGATION  1978   VIDEO BRONCHOSCOPY WITH RADIAL ENDOBRONCHIAL ULTRASOUND  08/01/2021   Procedure: RADIAL ENDOBRONCHIAL ULTRASOUND;  Surgeon: Josephine Igo, DO;  Location: [facility];  Service: Pulmonary;;    REVIEW OF SYSTEMS:   Review of Systems  Constitutional: Negative for appetite change, chills, fatigue, fever and unexpected weight change.  HENT:   Negative for mouth sores, nosebleeds, sore throat and trouble swallowing.   Eyes: Negative for eye problems and icterus.  Respiratory: Negative for cough, hemoptysis, shortness of breath and wheezing.   Cardiovascular: Negative for chest pain and leg swelling.  Gastrointestinal: Negative for abdominal pain, constipation, diarrhea, nausea and vomiting.  Genitourinary: Negative for bladder incontinence, difficulty urinating, dysuria, frequency and hematuria.   Musculoskeletal: Negative for  back pain, gait problem, neck pain and neck stiffness.  Skin: Negative for itching and rash.  Neurological: Negative for dizziness, extremity weakness, gait problem, headaches, light-headedness and seizures.  Hematological: Negative for adenopathy. Does not bruise/bleed easily.  Psychiatric/Behavioral: Negative for confusion, depression and sleep disturbance. The patient is not nervous/anxious.     PHYSICAL EXAMINATION:  There were no vitals taken for this visit.  ECOG PERFORMANCE STATUS: {CHL ONC ECOG [id number]  Physical Exam  Constitutional: Oriented to person, place, and time and well-developed, well-nourished, and in no distress. No distress.  HENT:  Head: Normocephalic and atraumatic.  Mouth/Throat: Oropharynx is clear and moist. No oropharyngeal exudate.  Eyes: Conjunctivae are normal. Right eye exhibits no discharge. Left eye exhibits no discharge. No  scleral icterus.  Neck: Normal range of motion. Neck supple.  Cardiovascular: Normal rate, regular rhythm, normal heart sounds and intact distal pulses.   Pulmonary/Chest: Effort normal and breath sounds normal. No respiratory distress. No wheezes. No rales.  Abdominal: Soft. Bowel sounds are normal. Exhibits no distension and no mass. There is no tenderness.  Musculoskeletal: Normal range of motion. Exhibits no edema.  Lymphadenopathy:    No cervical adenopathy.  Neurological: Alert and oriented to person, place, and time. Exhibits normal muscle tone. Gait normal. Coordination normal.  Skin: Skin is warm and dry. No rash noted. Not diaphoretic. No erythema. No pallor.  Psychiatric: Mood, memory and judgment normal.  Vitals reviewed.  LABORATORY DATA: Lab Results  Component Value Date   WBC 10.9 (H) 10/22/2022   HGB 11.2 (L) 10/22/2022   HCT 34.4 (L) 10/22/2022   MCV 99.7 10/22/2022   PLT 237 10/22/2022      Chemistry      Component Value Date/Time   NA 132 (L) 10/22/2022 0920   K 3.4 (L) 10/22/2022 0920   CL 103  10/22/2022 0920   CO2 18 (L) 10/22/2022 0920   BUN 9 10/22/2022 0920   CREATININE 0.70 10/22/2022 0920   CREATININE 1.11 (H) 08/31/2022 0924      Component Value Date/Time   CALCIUM 7.5 (L) 10/22/2022 0920   ALKPHOS 54 10/22/2022 0920   AST 42 (H) 10/22/2022 0920   AST 15 08/31/2022 0924   ALT 45 (H) 10/22/2022 0920   ALT 18 08/31/2022 0924   BILITOT 0.5 10/22/2022 0920   BILITOT 0.5 08/31/2022 0924       RADIOGRAPHIC STUDIES:  CT HEAD WO CONTRAST ( )  Result Date: 10/20/2022 CLINICAL DATA:  Altered mental status EXAM: CT HEAD WITHOUT CONTRAST TECHNIQUE: Contiguous axial images were obtained from the base of the skull through the vertex without intravenous contrast. RADIATION DOSE REDUCTION: This exam was performed according to the departmental dose-optimization program which includes automated exposure control, adjustment of the mA and/or kV according to patient size and/or use of iterative reconstruction technique. COMPARISON:  None Available. FINDINGS: Brain: There is no mass, hemorrhage or extra-axial collection. The size and configuration of the ventricles and extra-axial CSF spaces are normal. There is hypoattenuation of the white matter, most commonly indicating chronic small vessel disease. Vascular: No abnormal hyperdensity of the major intracranial arteries or dural venous sinuses. No intracranial atherosclerosis. Skull: The visualized skull base, calvarium and extracranial soft tissues are normal. Sinuses/Orbits: No fluid levels or advanced mucosal thickening of the visualized paranasal sinuses. No mastoid or middle ear effusion. The orbits are normal. 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 Electronically Signed   By: Deatra Robinson M.D.   On: 10/20/2022 21:50   DG Chest Port 1 View  Result Date: 10/19/2022 CLINICAL DATA:  Status post PICC line EXAM: PORTABLE CHEST 1 VIEW COMPARISON:  Chest x-ray 04/17/2022 FINDINGS: Right upper  extremity PICC terminates over the distal SVC. Patient is rotated. The heart size and mediastinal contours are within normal limits. Both lungs are clear. No pneumothorax. The visualized skeletal structures are unremarkable. IMPRESSION: Right upper extremity PICC terminates over the distal SVC. Electronically Signed   By: Darliss Cheney M.D.   On: 10/19/2022 18:10   [place] EKG SITE RITE  Result Date: 10/19/2022 If Site Rite image not attached, placement could not be confirmed due to current cardiac rhythm.  CT ANGIO GI BLEED  Result Date: 10/17/2022 CLINICAL DATA:  Chronic mesenteric ischemia, metastatic  neuroendocrine tumor * Tracking Code: BO * EXAM: CTA ABDOMEN AND PELVIS WITHOUT AND WITH CONTRAST TECHNIQUE: Multidetector CT imaging of the abdomen and pelvis was performed using the standard protocol during bolus administration of intravenous contrast. Multiplanar reconstructed images and MIPs were obtained and reviewed to evaluate the vascular anatomy. RADIATION DOSE REDUCTION: This exam was performed according to the departmental dose-optimization program which includes automated exposure control, adjustment of the mA and/or kV according to patient size and/or use of iterative reconstruction technique. CONTRAST:  OMNIPAQUE IOHEXOL 350 MG/ML SOLN COMPARISON:  04/17/2022 FINDINGS: VASCULAR Normal contour and caliber of the abdominal aorta. No evidence of aneurysm, dissection, or other acute aortic pathology. Mild to moderate mixed calcific atherosclerosis. Small accessory right renal artery with a solitary left renal artery and otherwise standard branching pattern of the abdominal aorta. Scattered atherosclerosis of the branch vessel origins without significant stenosis. In particular, the superior mesenteric artery is widely patent through its first and second order branches. Review of the MIP images confirms the above findings. NON-VASCULAR Lower chest: No acute abnormality. Cardiomegaly. Moderate hernia  with intrathoracic position of the gastric fundus. Numerous small nodules in the included bilateral lung bases, largest in the left lung base measuring 1.0 cm (series 6, image 22). Hepatobiliary: No focal liver abnormality is seen. Status post cholecystectomy. No biliary dilatation. Pancreas: Unremarkable. No pancreatic ductal dilatation or surrounding inflammatory changes. Spleen: Normal in size without significant abnormality. Adrenals/Urinary Tract: Adrenal glands are unremarkable. Enhancing mass of the posterior midportion of the left kidney measuring 1.9 x 1.8 cm (series 4, image 79). Bladder is unremarkable. Stomach/Bowel: Proximal stomach is normal. Severe wall thickening and inflammatory fat stranding about the pylorus, duodenum, and proximal jejunum (series 4, image 69, 96). Diverticulum of the descending duodenum. Appendix appears normal. The colon is fluid-filled to the rectum. Sigmoid diverticula without evidence of diverticulitis. Lymphatic: No enlarged abdominal or pelvic lymph nodes. Reproductive: No mass or other significant abnormality. Other: No abdominal wall hernia or abnormality. No ascites. Musculoskeletal: No acute or significant osseous findings. IMPRESSION: 1. Normal contour and caliber of the abdominal aorta. No evidence of aneurysm, dissection, or other acute aortic pathology. 2. Scattered atherosclerosis of the branch vessel origins without significant stenosis. In particular, the superior mesenteric artery is widely patent through its first and second order branches. 3. Severe wall thickening and inflammatory fat stranding about the pylorus, duodenal, and proximal jejunum, consistent with nonspecific infectious or inflammatory enteritis. 4. The colon is fluid-filled to the rectum, consistent with diarrheal illness. 5. Enhancing mass of the posterior midportion of the left kidney measuring 1.9 x 1.8 cm, consistent with renal cell carcinoma. No evidence of renal vein invasion,  lymphadenopathy, or abdominal metastatic disease. 6. Numerous small nodules in the included bilateral lung bases, consistent with patient's known metastatic neuroendocrine tumor. Aortic Atherosclerosis (ICD10-I70.0). Electronically Signed   By: Jearld Lesch M.D.   On: 10/17/2022 19:44     ASSESSMENT/PLAN:   This is a very pleasant 77  years old white female diagnosed with  1) stage IV (T1b, N0, M1 a) low-grade neuroendocrine carcinoma (carcinoid tumor) presented with innumerable bilateral pulmonary nodules diagnosed in March 2023.  2) left renal lesion suspicious for renal cell carcinoma  Regarding the neuroendocrine tumor, CT-guided core biopsy of the left lung nodule was consistent with low-grade neuroendocrine carcinoma. The NCCN guideline would recommend continuous observation and monitoring for patient with bilateral pulmonary nodules consistent with a carcinoid tumor unless the patient is symptomatic then we would consider her  for treatment with octreotide LAR to control her symptoms.   Regarding this, she is feeling fine.   For the renal lesion, she recently saw Dr. Alvera Novel from urology who recommended ***  The patient was seen with Dr. Arbutus Ped. Labs were reviewed. Dr. Arbutus Ped recommends ***  F/U?  The patient was advised to call immediately if she has any concerning symptoms in the interval. The patient voices understanding of current disease status and treatment options and is in agreement with the current care plan. All questions were answered. The patient knows to call the clinic with any problems, questions or concerns. We can certainly see the patient much sooner if necessary       No orders of the defined types were placed in this encounter.    I spent {CHL ONC TIME VISIT - ZOXWR:[id number]} counseling the patient face to face. The total time spent in the appointment was {CHL ONC TIME VISIT - BJYNW:[id number]}.  Donne Baley L Trace Cederberg, PA-C 11/03/22

## 2022-11-05 DIAGNOSIS — E876 Hypokalemia: Secondary | ICD-10-CM | POA: Diagnosis not present

## 2022-11-05 DIAGNOSIS — I110 Hypertensive heart disease with heart failure: Secondary | ICD-10-CM | POA: Diagnosis not present

## 2022-11-05 DIAGNOSIS — F32A Depression, unspecified: Secondary | ICD-10-CM | POA: Diagnosis not present

## 2022-11-05 DIAGNOSIS — F03A3 Unspecified dementia, mild, with mood disturbance: Secondary | ICD-10-CM | POA: Diagnosis not present

## 2022-11-05 DIAGNOSIS — N390 Urinary tract infection, site not specified: Secondary | ICD-10-CM | POA: Diagnosis not present

## 2022-11-05 DIAGNOSIS — E871 Hypo-osmolality and hyponatremia: Secondary | ICD-10-CM | POA: Diagnosis not present

## 2022-11-07 ENCOUNTER — Inpatient Hospital Stay: Payer: Medicare Other

## 2022-11-07 ENCOUNTER — Inpatient Hospital Stay (HOSPITAL_BASED_OUTPATIENT_CLINIC_OR_DEPARTMENT_OTHER): Payer: Medicare Other | Admitting: Physician Assistant

## 2022-11-07 VITALS — BP 133/106 | HR 97 | Temp 97.2°F | Resp 18

## 2022-11-07 DIAGNOSIS — F419 Anxiety disorder, unspecified: Secondary | ICD-10-CM | POA: Diagnosis not present

## 2022-11-07 DIAGNOSIS — K921 Melena: Secondary | ICD-10-CM | POA: Diagnosis not present

## 2022-11-07 DIAGNOSIS — C3492 Malignant neoplasm of unspecified part of left bronchus or lung: Secondary | ICD-10-CM | POA: Diagnosis not present

## 2022-11-07 DIAGNOSIS — Z8249 Family history of ischemic heart disease and other diseases of the circulatory system: Secondary | ICD-10-CM | POA: Diagnosis not present

## 2022-11-07 DIAGNOSIS — E1149 Type 2 diabetes mellitus with other diabetic neurological complication: Secondary | ICD-10-CM | POA: Diagnosis not present

## 2022-11-07 DIAGNOSIS — D75839 Thrombocytosis, unspecified: Secondary | ICD-10-CM | POA: Diagnosis present

## 2022-11-07 DIAGNOSIS — E871 Hypo-osmolality and hyponatremia: Secondary | ICD-10-CM | POA: Diagnosis not present

## 2022-11-07 DIAGNOSIS — E860 Dehydration: Secondary | ICD-10-CM | POA: Diagnosis not present

## 2022-11-07 DIAGNOSIS — C642 Malignant neoplasm of left kidney, except renal pelvis: Secondary | ICD-10-CM | POA: Diagnosis not present

## 2022-11-07 DIAGNOSIS — F32A Depression, unspecified: Secondary | ICD-10-CM | POA: Diagnosis present

## 2022-11-07 DIAGNOSIS — F05 Delirium due to known physiological condition: Secondary | ICD-10-CM | POA: Diagnosis not present

## 2022-11-07 DIAGNOSIS — Z79899 Other long term (current) drug therapy: Secondary | ICD-10-CM | POA: Insufficient documentation

## 2022-11-07 DIAGNOSIS — D735 Infarction of spleen: Secondary | ICD-10-CM | POA: Diagnosis not present

## 2022-11-07 DIAGNOSIS — I130 Hypertensive heart and chronic kidney disease with heart failure and stage 1 through stage 4 chronic kidney disease, or unspecified chronic kidney disease: Secondary | ICD-10-CM | POA: Diagnosis not present

## 2022-11-07 DIAGNOSIS — N183 Chronic kidney disease, stage 3 unspecified: Secondary | ICD-10-CM | POA: Diagnosis not present

## 2022-11-07 DIAGNOSIS — R112 Nausea with vomiting, unspecified: Secondary | ICD-10-CM | POA: Diagnosis not present

## 2022-11-07 DIAGNOSIS — D72829 Elevated white blood cell count, unspecified: Secondary | ICD-10-CM | POA: Diagnosis not present

## 2022-11-07 DIAGNOSIS — G9341 Metabolic encephalopathy: Secondary | ICD-10-CM | POA: Diagnosis not present

## 2022-11-07 DIAGNOSIS — Z8619 Personal history of other infectious and parasitic diseases: Secondary | ICD-10-CM | POA: Diagnosis not present

## 2022-11-07 DIAGNOSIS — R059 Cough, unspecified: Secondary | ICD-10-CM | POA: Diagnosis not present

## 2022-11-07 DIAGNOSIS — K449 Diaphragmatic hernia without obstruction or gangrene: Secondary | ICD-10-CM | POA: Diagnosis not present

## 2022-11-07 DIAGNOSIS — N2889 Other specified disorders of kidney and ureter: Secondary | ICD-10-CM | POA: Insufficient documentation

## 2022-11-07 DIAGNOSIS — R1115 Cyclical vomiting syndrome unrelated to migraine: Secondary | ICD-10-CM | POA: Diagnosis not present

## 2022-11-07 DIAGNOSIS — K269 Duodenal ulcer, unspecified as acute or chronic, without hemorrhage or perforation: Secondary | ICD-10-CM | POA: Diagnosis not present

## 2022-11-07 DIAGNOSIS — N200 Calculus of kidney: Secondary | ICD-10-CM | POA: Diagnosis not present

## 2022-11-07 DIAGNOSIS — E0865 Diabetes mellitus due to underlying condition with hyperglycemia: Secondary | ICD-10-CM | POA: Diagnosis not present

## 2022-11-07 DIAGNOSIS — C7A8 Other malignant neuroendocrine tumors: Secondary | ICD-10-CM | POA: Diagnosis not present

## 2022-11-07 DIAGNOSIS — K2980 Duodenitis without bleeding: Secondary | ICD-10-CM | POA: Diagnosis not present

## 2022-11-07 DIAGNOSIS — C3490 Malignant neoplasm of unspecified part of unspecified bronchus or lung: Secondary | ICD-10-CM

## 2022-11-07 DIAGNOSIS — I10 Essential (primary) hypertension: Secondary | ICD-10-CM | POA: Diagnosis not present

## 2022-11-07 DIAGNOSIS — Z833 Family history of diabetes mellitus: Secondary | ICD-10-CM | POA: Diagnosis not present

## 2022-11-07 DIAGNOSIS — K260 Acute duodenal ulcer with hemorrhage: Secondary | ICD-10-CM | POA: Diagnosis not present

## 2022-11-07 DIAGNOSIS — C7A090 Malignant carcinoid tumor of the bronchus and lung: Secondary | ICD-10-CM | POA: Insufficient documentation

## 2022-11-07 DIAGNOSIS — D62 Acute posthemorrhagic anemia: Secondary | ICD-10-CM | POA: Diagnosis not present

## 2022-11-07 DIAGNOSIS — I110 Hypertensive heart disease with heart failure: Secondary | ICD-10-CM | POA: Diagnosis not present

## 2022-11-07 DIAGNOSIS — I509 Heart failure, unspecified: Secondary | ICD-10-CM | POA: Diagnosis not present

## 2022-11-07 DIAGNOSIS — K264 Chronic or unspecified duodenal ulcer with hemorrhage: Secondary | ICD-10-CM | POA: Diagnosis not present

## 2022-11-07 DIAGNOSIS — F0394 Unspecified dementia, unspecified severity, with anxiety: Secondary | ICD-10-CM | POA: Diagnosis not present

## 2022-11-07 DIAGNOSIS — I441 Atrioventricular block, second degree: Secondary | ICD-10-CM | POA: Diagnosis not present

## 2022-11-07 DIAGNOSIS — K922 Gastrointestinal hemorrhage, unspecified: Secondary | ICD-10-CM | POA: Diagnosis not present

## 2022-11-07 DIAGNOSIS — E7800 Pure hypercholesterolemia, unspecified: Secondary | ICD-10-CM | POA: Diagnosis present

## 2022-11-07 DIAGNOSIS — E1165 Type 2 diabetes mellitus with hyperglycemia: Secondary | ICD-10-CM | POA: Diagnosis not present

## 2022-11-07 DIAGNOSIS — R918 Other nonspecific abnormal finding of lung field: Secondary | ICD-10-CM | POA: Diagnosis present

## 2022-11-07 DIAGNOSIS — F410 Panic disorder [episodic paroxysmal anxiety] without agoraphobia: Secondary | ICD-10-CM | POA: Diagnosis present

## 2022-11-07 LAB — CMP (CANCER CENTER ONLY)
ALT: 13 U/L (ref 0–44)
AST: 12 U/L — ABNORMAL LOW (ref 15–41)
Albumin: 3.8 g/dL (ref 3.5–5.0)
Alkaline Phosphatase: 101 U/L (ref 38–126)
Anion gap: 11 (ref 5–15)
BUN: 9 mg/dL (ref 8–23)
CO2: 25 mmol/L (ref 22–32)
Calcium: 9.7 mg/dL (ref 8.9–10.3)
Chloride: 100 mmol/L (ref 98–111)
Creatinine: 0.85 mg/dL (ref 0.44–1.00)
GFR, Estimated: >60 mL/min (ref >60)
Glucose, Bld: 261 mg/dL — ABNORMAL HIGH (ref 70–99)
Potassium: 3.6 mmol/L (ref 3.5–5.1)
Sodium: 136 mmol/L (ref 135–145)
Total Bilirubin: 0.5 mg/dL (ref 0.3–1.2)
Total Protein: 7.8 g/dL (ref 6.5–8.1)

## 2022-11-07 LAB — CBC WITH DIFFERENTIAL (CANCER CENTER ONLY)
Abs Immature Granulocytes: 0.04 10*3/uL (ref 0.00–0.07)
Basophils Absolute: 0.1 10*3/uL (ref 0.0–0.1)
Basophils Relative: 1 %
Eosinophils Absolute: 0.1 10*3/uL (ref 0.0–0.5)
Eosinophils Relative: 1 %
HCT: 34.6 % — ABNORMAL LOW (ref 36.0–46.0)
Hemoglobin: 11 g/dL — ABNORMAL LOW (ref 12.0–15.0)
Immature Granulocytes: 0 %
Lymphocytes Relative: 12 %
Lymphs Abs: 1.2 10*3/uL (ref 0.7–4.0)
MCH: 30.5 pg (ref 26.0–34.0)
MCHC: 31.8 g/dL (ref 30.0–36.0)
MCV: 95.8 fL (ref 80.0–100.0)
Monocytes Absolute: 0.5 10*3/uL (ref 0.1–1.0)
Monocytes Relative: 5 %
Neutro Abs: 8.1 10*3/uL — ABNORMAL HIGH (ref 1.7–7.7)
Neutrophils Relative %: 81 %
Platelet Count: 564 10*3/uL — ABNORMAL HIGH (ref 150–400)
RBC: 3.61 MIL/uL — ABNORMAL LOW (ref 3.87–5.11)
RDW: 13.2 % (ref 11.5–15.5)
WBC Count: 10 10*3/uL (ref 4.0–10.5)
nRBC: 0 % (ref 0.0–0.2)

## 2022-11-07 MED ORDER — LORAZEPAM 1 MG PO TABS
0.5000 mg | ORAL_TABLET | Freq: Once | ORAL | Status: AC
  Administered 2022-11-07: 0.5 mg via ORAL
  Filled 2022-11-07: qty 1

## 2022-11-07 MED ORDER — LORAZEPAM 1 MG PO TABS: 0.5000 mg | ORAL_TABLET | Freq: Once | ORAL | Status: DC

## 2022-11-08 ENCOUNTER — Inpatient Hospital Stay (HOSPITAL_BASED_OUTPATIENT_CLINIC_OR_DEPARTMENT_OTHER)
Admission: EM | Admit: 2022-11-08 | Discharge: 2022-11-13 | DRG: 391 | Disposition: A | Discharge status: Home Health | Payer: Medicare Other | Attending: Internal Medicine | Admitting: Internal Medicine

## 2022-11-08 ENCOUNTER — Emergency Department (HOSPITAL_BASED_OUTPATIENT_CLINIC_OR_DEPARTMENT_OTHER): Payer: Medicare Other

## 2022-11-08 ENCOUNTER — Encounter (HOSPITAL_BASED_OUTPATIENT_CLINIC_OR_DEPARTMENT_OTHER): Payer: Self-pay

## 2022-11-08 ENCOUNTER — Other Ambulatory Visit: Payer: Self-pay

## 2022-11-08 ENCOUNTER — Other Ambulatory Visit (HOSPITAL_BASED_OUTPATIENT_CLINIC_OR_DEPARTMENT_OTHER): Payer: Self-pay

## 2022-11-08 DIAGNOSIS — Z87442 Personal history of urinary calculi: Secondary | ICD-10-CM

## 2022-11-08 DIAGNOSIS — I10 Essential (primary) hypertension: Secondary | ICD-10-CM | POA: Diagnosis not present

## 2022-11-08 DIAGNOSIS — K269 Duodenal ulcer, unspecified as acute or chronic, without hemorrhage or perforation: Secondary | ICD-10-CM | POA: Insufficient documentation

## 2022-11-08 DIAGNOSIS — D72829 Elevated white blood cell count, unspecified: Secondary | ICD-10-CM

## 2022-11-08 DIAGNOSIS — K219 Gastro-esophageal reflux disease without esophagitis: Secondary | ICD-10-CM | POA: Diagnosis present

## 2022-11-08 DIAGNOSIS — C642 Malignant neoplasm of left kidney, except renal pelvis: Secondary | ICD-10-CM | POA: Diagnosis not present

## 2022-11-08 DIAGNOSIS — Z8619 Personal history of other infectious and parasitic diseases: Secondary | ICD-10-CM

## 2022-11-08 DIAGNOSIS — E860 Dehydration: Secondary | ICD-10-CM | POA: Diagnosis not present

## 2022-11-08 DIAGNOSIS — H4010X0 Unspecified open-angle glaucoma, stage unspecified: Secondary | ICD-10-CM | POA: Diagnosis present

## 2022-11-08 DIAGNOSIS — Z833 Family history of diabetes mellitus: Secondary | ICD-10-CM

## 2022-11-08 DIAGNOSIS — Z9841 Cataract extraction status, right eye: Secondary | ICD-10-CM

## 2022-11-08 DIAGNOSIS — C7A8 Other malignant neuroendocrine tumors: Secondary | ICD-10-CM | POA: Diagnosis present

## 2022-11-08 DIAGNOSIS — K2980 Duodenitis without bleeding: Secondary | ICD-10-CM | POA: Diagnosis not present

## 2022-11-08 DIAGNOSIS — F32A Depression, unspecified: Secondary | ICD-10-CM | POA: Diagnosis present

## 2022-11-08 DIAGNOSIS — F05 Delirium due to known physiological condition: Secondary | ICD-10-CM | POA: Diagnosis not present

## 2022-11-08 DIAGNOSIS — M19041 Primary osteoarthritis, right hand: Secondary | ICD-10-CM | POA: Diagnosis present

## 2022-11-08 DIAGNOSIS — K264 Chronic or unspecified duodenal ulcer with hemorrhage: Secondary | ICD-10-CM | POA: Diagnosis not present

## 2022-11-08 DIAGNOSIS — G9341 Metabolic encephalopathy: Secondary | ICD-10-CM | POA: Diagnosis not present

## 2022-11-08 DIAGNOSIS — R918 Other nonspecific abnormal finding of lung field: Secondary | ICD-10-CM | POA: Diagnosis present

## 2022-11-08 DIAGNOSIS — F410 Panic disorder [episodic paroxysmal anxiety] without agoraphobia: Secondary | ICD-10-CM | POA: Diagnosis present

## 2022-11-08 DIAGNOSIS — R112 Nausea with vomiting, unspecified: Secondary | ICD-10-CM | POA: Diagnosis present

## 2022-11-08 DIAGNOSIS — D62 Acute posthemorrhagic anemia: Secondary | ICD-10-CM | POA: Diagnosis not present

## 2022-11-08 DIAGNOSIS — G8929 Other chronic pain: Secondary | ICD-10-CM | POA: Diagnosis present

## 2022-11-08 DIAGNOSIS — R1115 Cyclical vomiting syndrome unrelated to migraine: Principal | ICD-10-CM

## 2022-11-08 DIAGNOSIS — K449 Diaphragmatic hernia without obstruction or gangrene: Secondary | ICD-10-CM | POA: Diagnosis present

## 2022-11-08 DIAGNOSIS — I509 Heart failure, unspecified: Secondary | ICD-10-CM

## 2022-11-08 DIAGNOSIS — E1165 Type 2 diabetes mellitus with hyperglycemia: Secondary | ICD-10-CM | POA: Diagnosis not present

## 2022-11-08 DIAGNOSIS — E559 Vitamin D deficiency, unspecified: Secondary | ICD-10-CM | POA: Diagnosis present

## 2022-11-08 DIAGNOSIS — Z9049 Acquired absence of other specified parts of digestive tract: Secondary | ICD-10-CM

## 2022-11-08 DIAGNOSIS — D75839 Thrombocytosis, unspecified: Secondary | ICD-10-CM | POA: Diagnosis present

## 2022-11-08 DIAGNOSIS — Z79899 Other long term (current) drug therapy: Secondary | ICD-10-CM

## 2022-11-08 DIAGNOSIS — I130 Hypertensive heart and chronic kidney disease with heart failure and stage 1 through stage 4 chronic kidney disease, or unspecified chronic kidney disease: Secondary | ICD-10-CM | POA: Diagnosis present

## 2022-11-08 DIAGNOSIS — E0865 Diabetes mellitus due to underlying condition with hyperglycemia: Secondary | ICD-10-CM | POA: Diagnosis not present

## 2022-11-08 DIAGNOSIS — I441 Atrioventricular block, second degree: Secondary | ICD-10-CM | POA: Diagnosis not present

## 2022-11-08 DIAGNOSIS — E871 Hypo-osmolality and hyponatremia: Secondary | ICD-10-CM

## 2022-11-08 DIAGNOSIS — F0394 Unspecified dementia, unspecified severity, with anxiety: Secondary | ICD-10-CM | POA: Diagnosis not present

## 2022-11-08 DIAGNOSIS — Z7984 Long term (current) use of oral hypoglycemic drugs: Secondary | ICD-10-CM

## 2022-11-08 DIAGNOSIS — R059 Cough, unspecified: Secondary | ICD-10-CM | POA: Diagnosis not present

## 2022-11-08 DIAGNOSIS — D735 Infarction of spleen: Secondary | ICD-10-CM | POA: Diagnosis not present

## 2022-11-08 DIAGNOSIS — M5441 Lumbago with sciatica, right side: Secondary | ICD-10-CM | POA: Diagnosis present

## 2022-11-08 DIAGNOSIS — N183 Chronic kidney disease, stage 3 unspecified: Secondary | ICD-10-CM | POA: Diagnosis present

## 2022-11-08 DIAGNOSIS — J309 Allergic rhinitis, unspecified: Secondary | ICD-10-CM | POA: Diagnosis present

## 2022-11-08 DIAGNOSIS — N200 Calculus of kidney: Secondary | ICD-10-CM | POA: Diagnosis not present

## 2022-11-08 DIAGNOSIS — Z9842 Cataract extraction status, left eye: Secondary | ICD-10-CM

## 2022-11-08 DIAGNOSIS — Z8249 Family history of ischemic heart disease and other diseases of the circulatory system: Secondary | ICD-10-CM

## 2022-11-08 DIAGNOSIS — N2889 Other specified disorders of kidney and ureter: Secondary | ICD-10-CM | POA: Diagnosis present

## 2022-11-08 DIAGNOSIS — R195 Other fecal abnormalities: Secondary | ICD-10-CM | POA: Diagnosis present

## 2022-11-08 DIAGNOSIS — E7800 Pure hypercholesterolemia, unspecified: Secondary | ICD-10-CM | POA: Diagnosis present

## 2022-11-08 DIAGNOSIS — M19042 Primary osteoarthritis, left hand: Secondary | ICD-10-CM | POA: Diagnosis present

## 2022-11-08 LAB — COMPREHENSIVE METABOLIC PANEL
ALT: 12 U/L (ref 0–44)
AST: 15 U/L (ref 15–41)
Albumin: 3.7 g/dL (ref 3.5–5.0)
Alkaline Phosphatase: 84 U/L (ref 38–126)
Anion gap: 16 — ABNORMAL HIGH (ref 5–15)
BUN: 15 mg/dL (ref 8–23)
CO2: 24 mmol/L (ref 22–32)
Calcium: 9.7 mg/dL (ref 8.9–10.3)
Chloride: 94 mmol/L — ABNORMAL LOW (ref 98–111)
Creatinine, Ser: 1 mg/dL (ref 0.44–1.00)
GFR, Estimated: 59 mL/min — ABNORMAL LOW (ref >60)
Glucose, Bld: 354 mg/dL — ABNORMAL HIGH (ref 70–99)
Potassium: 3.9 mmol/L (ref 3.5–5.1)
Sodium: 134 mmol/L — ABNORMAL LOW (ref 135–145)
Total Bilirubin: 0.6 mg/dL (ref 0.3–1.2)
Total Protein: 7.4 g/dL (ref 6.5–8.1)

## 2022-11-08 LAB — CBG MONITORING, ED: Glucose-Capillary: 246 mg/dL — ABNORMAL HIGH (ref 70–99)

## 2022-11-08 LAB — AMMONIA: Ammonia: 17 umol/L (ref 9–35)

## 2022-11-08 LAB — MAGNESIUM: Magnesium: 1.7 mg/dL (ref 1.7–2.4)

## 2022-11-08 LAB — GLUCOSE, CAPILLARY
Glucose-Capillary: 253 mg/dL — ABNORMAL HIGH (ref 70–99)
Glucose-Capillary: 153 mg/dL — ABNORMAL HIGH (ref 70–99)

## 2022-11-08 LAB — CBC
HCT: 32.5 % — ABNORMAL LOW (ref 36.0–46.0)
Hemoglobin: 10.3 g/dL — ABNORMAL LOW (ref 12.0–15.0)
MCH: 29.9 pg (ref 26.0–34.0)
MCHC: 31.7 g/dL (ref 30.0–36.0)
MCV: 94.2 fL (ref 80.0–100.0)
Platelets: 585 10*3/uL — ABNORMAL HIGH (ref 150–400)
RBC: 3.45 MIL/uL — ABNORMAL LOW (ref 3.87–5.11)
RDW: 13.3 % (ref 11.5–15.5)
WBC: 16.8 10*3/uL — ABNORMAL HIGH (ref 4.0–10.5)
nRBC: 0 % (ref 0.0–0.2)

## 2022-11-08 LAB — LIPASE, BLOOD: Lipase: 18 U/L (ref 11–51)

## 2022-11-08 MED ORDER — ONDANSETRON HCL 4 MG/2ML IJ SOLN
4.0000 mg | Freq: Once | INTRAMUSCULAR | Status: AC
  Administered 2022-11-08: 4 mg via INTRAVENOUS
  Filled 2022-11-08: qty 2

## 2022-11-08 MED ORDER — PANTOPRAZOLE SODIUM 40 MG IV SOLR
40.0000 mg | Freq: Once | INTRAVENOUS | Status: AC
  Administered 2022-11-08: 40 mg via INTRAVENOUS
  Filled 2022-11-08: qty 10

## 2022-11-08 MED ORDER — GABAPENTIN 300 MG PO CAPS
300.0000 mg | ORAL_CAPSULE | Freq: Every evening | ORAL | Status: DC
  Administered 2022-11-10 – 2022-11-12 (×3): 300 mg via ORAL
  Filled 2022-11-08 (×4): qty 1

## 2022-11-08 MED ORDER — LATANOPROST 0.005 % OP SOLN
1.0000 [drp] | Freq: Every day | OPHTHALMIC | Status: DC
  Administered 2022-11-08 – 2022-11-12 (×5): 1 [drp] via OPHTHALMIC
  Filled 2022-11-08: qty 2.5

## 2022-11-08 MED ORDER — PROCHLORPERAZINE EDISYLATE 10 MG/2ML IJ SOLN
10.0000 mg | Freq: Four times a day (QID) | INTRAMUSCULAR | Status: AC | PRN
  Administered 2022-11-08: 10 mg via INTRAVENOUS
  Filled 2022-11-08: qty 2

## 2022-11-08 MED ORDER — OXYCODONE HCL 5 MG PO TABS
5.0000 mg | ORAL_TABLET | ORAL | Status: DC | PRN
  Administered 2022-11-09: 5 mg via ORAL
  Filled 2022-11-08: qty 1

## 2022-11-08 MED ORDER — INSULIN ASPART 100 UNIT/ML IJ SOLN
0.0000 [IU] | Freq: Every day | INTRAMUSCULAR | Status: DC
  Administered 2022-11-11 – 2022-11-12 (×2): 2 [IU] via SUBCUTANEOUS

## 2022-11-08 MED ORDER — INSULIN ASPART 100 UNIT/ML IV SOLN
4.0000 [IU] | Freq: Once | INTRAVENOUS | Status: AC
  Administered 2022-11-08: 4 [IU] via INTRAVENOUS

## 2022-11-08 MED ORDER — SODIUM CHLORIDE 0.9 % IV BOLUS
500.0000 mL | Freq: Once | INTRAVENOUS | Status: AC
  Administered 2022-11-08: 500 mL via INTRAVENOUS

## 2022-11-08 MED ORDER — IOHEXOL 300 MG/ML  SOLN
100.0000 mL | Freq: Once | INTRAMUSCULAR | Status: AC | PRN
  Administered 2022-11-08: 80 mL via INTRAVENOUS

## 2022-11-08 MED ORDER — VITAMIN B-12 1000 MCG PO TABS
1000.0000 ug | ORAL_TABLET | Freq: Every day | ORAL | Status: DC
  Administered 2022-11-08 – 2022-11-13 (×6): 1000 ug via ORAL
  Filled 2022-11-08 (×6): qty 1

## 2022-11-08 MED ORDER — ENOXAPARIN SODIUM 80 MG/0.8ML IJ SOSY
80.0000 mg | PREFILLED_SYRINGE | Freq: Two times a day (BID) | INTRAMUSCULAR | Status: DC
  Administered 2022-11-08: 80 mg via SUBCUTANEOUS
  Filled 2022-11-08: qty 0.8

## 2022-11-08 MED ORDER — INSULIN ASPART 100 UNIT/ML IJ SOLN
0.0000 [IU] | Freq: Three times a day (TID) | INTRAMUSCULAR | Status: DC
  Administered 2022-11-08: 8 [IU] via SUBCUTANEOUS
  Administered 2022-11-09: 3 [IU] via SUBCUTANEOUS
  Administered 2022-11-09: 5 [IU] via SUBCUTANEOUS
  Administered 2022-11-09 – 2022-11-10 (×2): 3 [IU] via SUBCUTANEOUS
  Administered 2022-11-11: 5 [IU] via SUBCUTANEOUS
  Administered 2022-11-11: 3 [IU] via SUBCUTANEOUS
  Administered 2022-11-11: 2 [IU] via SUBCUTANEOUS
  Administered 2022-11-12: 5 [IU] via SUBCUTANEOUS
  Administered 2022-11-12: 2 [IU] via SUBCUTANEOUS
  Administered 2022-11-13: 3 [IU] via SUBCUTANEOUS
  Administered 2022-11-13: 5 [IU] via SUBCUTANEOUS

## 2022-11-08 MED ORDER — SODIUM CHLORIDE 0.9 % IV SOLN
2.0000 g | INTRAVENOUS | Status: AC
  Administered 2022-11-08 – 2022-11-12 (×5): 2 g via INTRAVENOUS
  Filled 2022-11-08 (×5): qty 20

## 2022-11-08 MED ORDER — HYDRALAZINE HCL 20 MG/ML IJ SOLN: 5.0000 mg | INTRAMUSCULAR | Status: DC | PRN

## 2022-11-08 MED ORDER — LORAZEPAM 2 MG/ML IJ SOLN
0.5000 mg | Freq: Three times a day (TID) | INTRAMUSCULAR | Status: DC
  Administered 2022-11-08 – 2022-11-11 (×9): 0.5 mg via INTRAVENOUS
  Filled 2022-11-08 (×9): qty 1

## 2022-11-08 MED ORDER — LACTATED RINGERS IV SOLN: INTRAVENOUS | Status: DC

## 2022-11-08 MED ORDER — AMLODIPINE BESYLATE 5 MG PO TABS
5.0000 mg | ORAL_TABLET | Freq: Every day | ORAL | Status: DC
  Administered 2022-11-08: 5 mg via ORAL
  Filled 2022-11-08: qty 1

## 2022-11-08 MED ORDER — METRONIDAZOLE 500 MG/100ML IV SOLN
500.0000 mg | Freq: Two times a day (BID) | INTRAVENOUS | Status: DC
  Administered 2022-11-08 – 2022-11-13 (×10): 500 mg via INTRAVENOUS
  Filled 2022-11-08 (×10): qty 100

## 2022-11-08 MED ORDER — PHENOL 1.4 % MT LIQD
1.0000 | OROMUCOSAL | Status: DC | PRN
  Administered 2022-11-08 – 2022-11-09 (×2): 1 via OROMUCOSAL
  Filled 2022-11-08: qty 177

## 2022-11-08 MED ORDER — ONDANSETRON HCL 4 MG/2ML IJ SOLN
4.0000 mg | Freq: Four times a day (QID) | INTRAMUSCULAR | Status: DC | PRN
  Administered 2022-11-08: 4 mg via INTRAVENOUS
  Filled 2022-11-08 (×2): qty 2

## 2022-11-08 MED ORDER — LORAZEPAM 0.5 MG PO TABS
0.5000 mg | ORAL_TABLET | Freq: Three times a day (TID) | ORAL | Status: DC
  Administered 2022-11-08: 0.5 mg via ORAL
  Filled 2022-11-08: qty 1

## 2022-11-08 MED ORDER — MORPHINE SULFATE (PF) 2 MG/ML IV SOLN: 2.0000 mg | INTRAVENOUS | Status: DC | PRN

## 2022-11-08 MED ORDER — ALBUTEROL SULFATE (2.5 MG/3ML) 0.083% IN NEBU: 2.5000 mg | INHALATION_SOLUTION | RESPIRATORY_TRACT | Status: DC | PRN

## 2022-11-08 NOTE — ED Notes (Signed)
Pt care transferred to and left with [facility]

## 2022-11-08 NOTE — Consult Note (Addendum)
Cardiology Consultation   Patient ID: [name] MRN: [id number]; DOB: [birth year]  Admit date: 11/08/2022 Date of Consult: 11/08/2022  PCP:  Shon Hale, MD   [facility] Providers Cardiologist:  None        Patient Profile:   Michaela Santana is a 77 y.o. female with a hx of first degree AVB, hypertension, hyperlipidemia, DM, staave IV low grade neuroendocrine carcinoma who is being seen 11/08/2022 for the evaluation of progressing AVB at the request of Dr. Randol Kern.  History of Present Illness:   Ms. Golin presented to the [facility] ED today with symptoms of recurrent nausea, vomiting, abdominal pain, weakness, confusion x2 days. Of note, patient was just hospitalized from 5/7-5/12 with similar symptoms, diagnosed with norovirus. Lab workup with leukocytosis 16.8, glucose 354, HGB 10.3, platelets 585. CT abd/pelvis w contrast showed possible splenic infarct, "likely improving duodenitis", "innumerable bilateral pulmonary nodules in keeping with known neuroendocrine tumor." Given symptoms and imaging findings, patient transferred to [facility] for admission. Admission ECG noted 2nd degree AV block and cardiology was asked to see patient.   Patient known to [facility], is a patient of Dr. Flora Lipps, has been followed regarding first degree AV block, PACs, PVCs, palpitations, and sinus bradycardia.  On exam, patient unable to contribute to HPI due to holding medications in her mouth and refusing to swallow. Her daughters are at bedside and advise that she has recently been doing this. Regarding possible cardiac symptoms with HB/bradycardia, they deny any episodes of syncope or near syncope.  Past Medical History:  Diagnosis Date   Allergic rhinitis    Anemia    Arthritis    "probably in hands" (07/17/2016)   CKD (chronic kidney disease), stage III (HCC)    Depression    Esophageal reflux    Family history of adverse reaction to anesthesia    "her father had some  issues when he had his gallbladder taken out when he was in his late 39s"   GERD (gastroesophageal reflux disease)    Glaucoma    open angle, bilateral   High cholesterol    History of kidney stones    Hypercholesterolemia    Hypertension    Kidney cysts    Lumbago with sciatica, right side    Sepsis (HCC) 07/2016   d/t flu   Type II diabetes mellitus (HCC)    Vitamin D deficiency     Past Surgical History:  Procedure Laterality Date   BREAST BIOPSY  1990   "? side; it wasn't anything"   BRONCHIAL BIOPSY  08/01/2021   Procedure: BRONCHIAL BIOPSIES;  Surgeon: Josephine Igo, DO;  Location: [facility] ENDOSCOPY;  Service: Pulmonary;;   BRONCHIAL NEEDLE ASPIRATION BIOPSY  08/01/2021   Procedure: BRONCHIAL NEEDLE ASPIRATION BIOPSIES;  Surgeon: Josephine Igo, DO;  Location: [facility] ENDOSCOPY;  Service: Pulmonary;;   CATARACT EXTRACTION, BILATERAL  2021   CHOLECYSTECTOMY OPEN  1971   COLONOSCOPY     2001,2011,2023   TUBAL LIGATION  1978   VIDEO BRONCHOSCOPY WITH RADIAL ENDOBRONCHIAL ULTRASOUND  08/01/2021   Procedure: RADIAL ENDOBRONCHIAL ULTRASOUND;  Surgeon: Josephine Igo, DO;  Location: [facility] ENDOSCOPY;  Service: Pulmonary;;     Home Medications:  Prior to Admission medications   Medication Sig Start Date End Date Taking? Authorizing Provider  acetaminophen (TYLENOL) 650 MG CR tablet Take 650 mg by mouth every 8 (eight) hours as needed for pain.   Yes [provider]  albuterol (VENTOLIN HFA) 108 (90 Base) MCG/ACT  inhaler Inhale into the lungs every 6 (six) hours as needed for wheezing or shortness of breath.   Yes [provider]  amLODipine (NORVASC) 5 MG tablet Take 5 mg by mouth daily. 01/20/20  Yes [provider]  Artificial Tear Solution (SYSTANE CONTACTS OP) Apply 0.4 drops to eye as needed.   Yes [provider]  calcium carbonate (TUMS - DOSED IN MG ELEMENTAL CALCIUM) 500 MG chewable tablet Chew 1 tablet by mouth daily.   Yes [provider]  DULoxetine (CYMBALTA) 30 MG capsule Take 30 mg by mouth daily.   Yes [provider]  famotidine (PEPCID) 20 MG tablet Take 20 mg by mouth daily.   Yes [provider]  ferrous sulfate 325 (65 FE) MG EC tablet Take 325 mg by mouth daily.   Yes [provider]  gabapentin (NEURONTIN) 300 MG capsule Take 300 mg by mouth every evening. 01/01/20  Yes [provider]  glipiZIDE (GLUCOTROL XL) 5 MG 24 hr tablet Take 5 mg by mouth daily with breakfast. 08/13/16  Yes [provider]  latanoprost (XALATAN) 0.005 % ophthalmic solution Place 1 drop into the right eye at bedtime. 09/21/22  Yes [provider]  loratadine (CLARITIN) 10 MG tablet Take 10 mg by mouth daily as needed for allergies.   Yes [provider]  LORazepam (ATIVAN) 0.5 MG tablet Take 0.5 mg by mouth every 8 (eight) hours.   Yes [provider]  losartan (COZAAR) 100 MG tablet Take 0.5 tablets (50 mg total) by mouth daily. 10/21/22  Yes Meredeth Ide, MD  magnesium oxide (MAG-OX) 400 (240 Mg) MG tablet Take 800 mg by mouth 2 (two) times daily. 06/23/21  Yes [provider]  ondansetron (ZOFRAN) 8 MG tablet Take 8 mg by mouth every 8 (eight) hours as needed for nausea/vomiting. 09/14/21  Yes [provider]  oxymetazoline (AFRIN) 0.05 % nasal spray Place 1 spray into both nostrils 2 (two) times daily.   Yes [provider]  QUEtiapine (SEROQUEL) 25 MG tablet Take 12.5 mg by mouth every other day.   Yes [provider]  vitamin B-12 (CYANOCOBALAMIN) 1000 MCG tablet Take 1 tablet (1,000 mcg total) by mouth daily. 10/29/21  Yes Amin, Loura Halt, MD  Blood Glucose Monitoring Suppl (FREESTYLE LITE) DEVI USE UTD 07/26/16   [provider]  FREESTYLE LITE test strip USE TO CHECK FASTING GLUCOSE IN THE MORNING AND 2 HOURS AFTER DINNER OR LUNCH 07/26/16   [provider]  Lancets (FREESTYLE) lancets  07/26/16   [provider]    Inpatient Medications: Scheduled Meds:  amLODipine  5 mg Oral Daily   cyanocobalamin  1,000 mcg Oral Daily   enoxaparin (LOVENOX) injection  80 mg Subcutaneous BID   gabapentin  300 mg Oral QPM   insulin aspart  0-15 Units Subcutaneous TID WC   insulin aspart  0-5 Units Subcutaneous QHS   latanoprost  1 drop Right Eye QHS   LORazepam  0.5 mg Oral Q8H   Continuous Infusions:  cefTRIAXone (ROCEPHIN)  IV     lactated ringers     metronidazole     PRN Meds: albuterol, hydrALAZINE, morphine injection, ondansetron (ZOFRAN) IV, oxyCODONE  Allergies:    Allergies  Allergen Reactions   Codeine Hypertension   Alprazolam Other (See Comments)    lethargic    Social History:   Social History   Socioeconomic History   Marital status: Widowed    Spouse name: Not  on file   Number of children: 2   Years of education: Not on file   Highest education level: Some college, no degree  Occupational History   Occupation: Geophysicist/field seismologist  Tobacco Use   Smoking status: Never   Smokeless tobacco: Never  Vaping Use   Vaping Use: Never used  Substance and Sexual Activity   Alcohol use: Never   Drug use: Never   Sexual activity: Not on file  Other Topics Concern   Not on file  Social History Narrative   02/08/20 Lives alone   caffeine soda 1-2 daily   Social Determinants of Health   Financial Resource Strain: Not on file  Food Insecurity: No Food Insecurity (11/08/2022)   Hunger Vital Sign    Worried About Running Out of Food in the Last Year: Never true    Ran Out of Food in the Last Year: Never true  Transportation Needs: No Transportation Needs (11/08/2022)   PRAPARE - Administrator, Civil Service (Medical): No    Lack of Transportation (Non-Medical): No  Physical Activity: Not on file  Stress: Not on file  Social Connections: Not on file  Intimate Partner Violence: Not At Risk (11/08/2022)   Humiliation, Afraid, Rape, and Kick questionnaire    Fear of  Current or Ex-Partner: No    Emotionally Abused: No    Physically Abused: No    Sexually Abused: No    Family History:    Family History  Problem Relation Age of Onset   Dementia Mother    Diabetes Mother    Stroke Father    Hypertension Father    CVA Father    Diabetes Sister    Hypertension Sister    Heart disease Paternal Grandfather    Breast cancer Neg Hx      ROS:  Please see the history of present illness.   All other ROS reviewed and negative.     Physical Exam/Data:   Vitals:   11/08/22 0817 11/08/22 1115 11/08/22 1217 11/08/22 1230  BP:  (!) 123/91  (!) 133/57  Pulse:  62  66  Resp:  (!) 24  (!) 27  Temp:   98 F (36.7 C)   TempSrc:      SpO2: 100% 97%  95%  Weight:      Height:        Intake/Output Summary (Last 24 hours) at 11/08/2022 1538 Last data filed at 11/08/2022 1222 Gross per 24 hour  Intake 985.71 ml  Output --  Net 985.71 ml      11/08/2022    7:53 AM 10/16/2022    1:38 PM 09/06/2022   10:51 AM  Last 3 Weights  Weight (lbs) 179 lb 184 lb 11.9 oz 184 lb 12.8 oz  Weight (kg) 81.194 kg 83.8 kg 83.825 kg     Body mass index is 33.82 kg/m.  General:  Patient in no acute distress HEENT: normal Neck: no JVD Vascular: No carotid bruits; Distal pulses 2+ bilaterally Cardiac:  normal S1, S2; slightly irregular with Mobitz I rhythm; no murmur  Lungs:  clear to auscultation bilaterally, no wheezing, rhonchi or rales  Abd: soft, nontender, no hepatomegaly  Ext: no edema Musculoskeletal:  No deformities Skin: warm and dry  Neuro:  no focal abnormalities noted Psych:  Normal affect   EKG:  The EKG was personally reviewed and demonstrates:  mobitz I second degree AV block. 3:2 conduction pattern Telemetry:  Telemetry was personally reviewed and demonstrates:  frequent Mobitz  I second degree AV block. HR >60 bpm.  Relevant CV Studies:  07/18/2020 Lexiscan stress  Lexiscan stress is electrically negative for ischemia Myovue scan shows  normal perfusion No ischemia LVEF is 70% Low risk study  04/29/20 TTE  IMPRESSIONS     1. Left ventricular ejection fraction, by estimation, is 65 to 70%. The  left ventricle has normal function. The left ventricle has no regional  wall motion abnormalities. Left ventricular diastolic parameters are  consistent with Grade I diastolic  dysfunction (impaired relaxation).   2. Right ventricular systolic function is normal. The right ventricular  size is normal. There is normal pulmonary artery systolic pressure. The  estimated right ventricular systolic pressure is 30.9 mmHg.   3. The mitral valve is grossly normal. No evidence of mitral valve  regurgitation.   4. The aortic valve is tricuspid. Aortic valve regurgitation is not  visualized.   5. The inferior vena cava is normal in size with greater than 50%  respiratory variability, suggesting right atrial pressure of 3 mmHg.   FINDINGS   Left Ventricle: Left ventricular ejection fraction, by estimation, is 65  to 70%. The left ventricle has normal function. The left ventricle has no  regional wall motion abnormalities. The left ventricular internal cavity  size was normal in size. There is   no left ventricular hypertrophy. Left ventricular diastolic parameters  are consistent with Grade I diastolic dysfunction (impaired relaxation).  Indeterminate filling pressures.   Right Ventricle: The right ventricular size is normal. No increase in  right ventricular wall thickness. Right ventricular systolic function is  normal. There is normal pulmonary artery systolic pressure. The tricuspid  regurgitant velocity is 2.64 m/s, and   with an assumed right atrial pressure of 3 mmHg, the estimated right  ventricular systolic pressure is 30.9 mmHg.   Left Atrium: Left atrial size was normal in size.   Right Atrium: Right atrial size was normal in size.   Pericardium: There is no evidence of pericardial effusion.   Mitral Valve: The  mitral valve is grossly normal. No evidence of mitral  valve regurgitation.   Tricuspid Valve: The tricuspid valve is grossly normal. Tricuspid valve  regurgitation is trivial.   Aortic Valve: The aortic valve is tricuspid. Aortic valve regurgitation is  not visualized.   Pulmonic Valve: The pulmonic valve was grossly normal. Pulmonic valve  regurgitation is trivial.   Aorta: The aortic root and ascending aorta are structurally normal, with  no evidence of dilitation.   Venous: The inferior vena cava is normal in size with greater than 50%  respiratory variability, suggesting right atrial pressure of 3 mmHg.   IAS/Shunts: No atrial level shunt detected by color flow Doppler.   Laboratory Data:  High Sensitivity Troponin:  No results for input(s): "TROPONINIHS" in the last 720 hours.   Chemistry Recent Labs  Lab 11/07/22 1032 11/08/22 0811  NA 136 134*  K 3.6 3.9  CL 100 94*  CO2 25 24  GLUCOSE 261* 354*  BUN 9 15  CREATININE 0.85 1.00  CALCIUM 9.7 9.7  MG  --  1.7  GFRNONAA >60 59*  ANIONGAP 11 16*    Recent Labs  Lab 11/07/22 1032 11/08/22 0811  PROT 7.8 7.4  ALBUMIN 3.8 3.7  AST 12* 15  ALT 13 12  ALKPHOS 101 84  BILITOT 0.5 0.6   Lipids No results for input(s): "CHOL", "TRIG", "HDL", "LABVLDL", "LDLCALC", "CHOLHDL" in the last 168 hours.  Hematology Recent Labs  Lab  11/07/22 1032 11/08/22 0811  WBC 10.0 16.8*  RBC 3.61* 3.45*  HGB 11.0* 10.3*  HCT 34.6* 32.5*  MCV 95.8 94.2  MCH 30.5 29.9  MCHC 31.8 31.7  RDW 13.2 13.3  PLT 564* 585*   Thyroid No results for input(s): "TSH", "FREET4" in the last 168 hours.  BNPNo results for input(s): "BNP", "PROBNP" in the last 168 hours.  DDimer No results for input(s): "DDIMER" in the last 168 hours.   Radiology/Studies:  CT ABDOMEN PELVIS W CONTRAST  Result Date: 11/08/2022 CLINICAL DATA:  One day history of vomiting associated with increased confusion EXAM: CT ABDOMEN AND PELVIS WITH CONTRAST  TECHNIQUE: Multidetector CT imaging of the abdomen and pelvis was performed using the standard protocol following bolus administration of intravenous contrast. RADIATION DOSE REDUCTION: This exam was performed according to the departmental dose-optimization program which includes automated exposure control, adjustment of the mA and/or kV according to patient size and/or use of iterative reconstruction technique. CONTRAST:  80mL OMNIPAQUE IOHEXOL 300 MG/ML  SOLN COMPARISON:  CTA abdomen and pelvis dated 10/17/2022 FINDINGS: Lower chest: Innumerable bilateral pulmonary nodules in keeping with known neuroendocrine tumor. No pleural effusion or pneumothorax demonstrated. Partially imaged heart size is normal. Hepatobiliary: Hypoattenuation along the falciform ligament may reflect perfusional variation or focal steatosis. no intra or extrahepatic biliary ductal dilation. Cholecystectomy. Pancreas: No focal lesions or main ductal dilation. Spleen: New, wedge-shaped hypoattenuation of the spleen. Adrenals/Urinary Tract: No adrenal nodules. No suspicious renal mass or hydronephrosis. Punctate nonobstructing left renal stone. No focal bladder wall thickening. Stomach/Bowel: Moderate hiatal hernia. Normal appearance of the stomach. Persistent mural thickening of the duodenal with slightly decreased periduodenal stranding. Duodenal diverticulum. Normal appendix. Vascular/Lymphatic: Aortic atherosclerosis. No enlarged abdominal or pelvic lymph nodes. Reproductive: No adnexal masses. Other: No free fluid, fluid collection, or free air. Musculoskeletal: No acute or abnormal lytic or blastic osseous lesions. Multilevel degenerative changes of the partially imaged thoracic and lumbar spine. IMPRESSION: 1. New, wedge-shaped hypoattenuation of the spleen, suspicious for splenic infarct. 2. Persistent mural thickening of the duodenum with slightly decreased periduodenal stranding, likely improving duodenitis. 3. Innumerable bilateral  pulmonary nodules in keeping with known neuroendocrine tumor. 4. Moderate hiatal hernia. 5. Aortic Atherosclerosis (ICD10-I70.0). Electronically Signed   By: Agustin Cree M.D.   On: 11/08/2022 10:36   DG Chest Portable 1 View  Result Date: 11/08/2022 CLINICAL DATA:  Cough and vomiting EXAM: PORTABLE CHEST 1 VIEW COMPARISON:  Chest radiograph dated 10/19/2022 FINDINGS: Normal lung volumes. Rounded retrocardiac opacity in keeping with known hiatal hernia. No new focal consolidations. No pleural effusion or pneumothorax. The heart size and mediastinal contours are within normal limits. No acute osseous abnormality. IMPRESSION: 1. No acute cardiopulmonary process. 2. Rounded retrocardiac opacity in keeping with known hiatal hernia. Electronically Signed   By: Agustin Cree M.D.   On: 11/08/2022 09:28     Assessment and Plan:   Second degree AV block, Mobitz I  Patient admitted with recurrent nausea/vomiting and abdominal pain incidentally found with second degree AV block on ECG. Per review, is 3:2 conducted Mobitz I. Of note, patient does have history of Mobitz I in setting of hypomagnesemia in December 2022.   Patient is asymptomatic from cardiovascular perspective. Heart block may be a result of increased vagal tone with acute GI issues or could just be incidental finding. Either way, with adequate HR and no symptoms, no cardiac intervention needed.  Avoid AV nodal agents and cholinesterase inhibitors to reduce risk of further decreased HR. Maintain  K>4, Mg>2.  Hypertension  On Amlodipine 5mg  and Losartan at home. Agree with holding Losartan in setting of GI loss.  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  I have reviewed the chart, notes and new data.  I agree with PA/NP's note.  Key new complaints: She is alert and clearly understands questions and responds with nods/shakes of the head, but she will not speak.  She is refusing to swallow the pills in her mouth and is trying to find a way to spit them out.  According to her 2 daughters were in the room, this is a common behavior that she sometimes exhibits.  She has never experienced syncope or near syncope.  She does not have angina or dyspnea. Key examination changes: Appears comfortable.  Regular rhythm with occasional irregularity due to blocked beats, average rate in the 60s.  Otherwise normal cardiovascular exam. Key new findings / data: Multiple telemetry strips and electrocardiograms shows clear evidence of suprahisian block.She has second-degree atrioventricular block Mobitz type I.  There is never evidence of Mobitz type II block.  She is not bradycardic.  The longest pauses under 2 seconds in duration.  Similar arrhythmia has been well-documented by Dr. Bufford Buttner during clinic visits.  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 No specific therapy is needed.  She does not meet indications for pacemaker implantation. It is wise to avoid medications with negative effect on intracardiac induction such as beta-blockers, centrally acting calcium channel blockers, clonidine, etc.  She is not receiving cholinesterase inhibitors. No further investigation or change in treatment at this point for this benign arrhythmia.  Intervention would only be necessary if she develops symptomatic bradycardia/pauses.  Follow-up as previously scheduled Dr. Margarette Canada, MD, [facility] [phone] 11/08/2022, 5:41 PM

## 2022-11-08 NOTE — Progress Notes (Signed)
ANTICOAGULATION CONSULT NOTE - Initial Consult  Pharmacy Consult for Lovenox Indication:  renal infarct  Allergies  Allergen Reactions   Codeine Hypertension   Alprazolam Other (See Comments)    lethargic    Patient Measurements: Height: 5\' 1"  (154.9 cm) Weight: 81.2 kg (179 lb) IBW/kg (Calculated) : 47.8 Heparin Dosing Weight:   Vital Signs: Temp: 98 F (36.7 C) (05/30 1217) Temp Source: Axillary (05/30 0800) BP: 133/57 (05/30 1230) Pulse Rate: 66 (05/30 1230)  Labs: Recent Labs    11/07/22 1032 11/08/22 0811  HGB 11.0* 10.3*  HCT 34.6* 32.5*  PLT 564* 585*  CREATININE 0.85 1.00    Estimated Creatinine Clearance: 47 mL/min (by C-G formula based on SCr of 1 mg/dL).   Medical History: Past Medical History:  Diagnosis Date   Allergic rhinitis    Anemia    Arthritis    "probably in hands" (07/17/2016)   CKD (chronic kidney disease), stage III (HCC)    Depression    Esophageal reflux    Family history of adverse reaction to anesthesia    "her father had some issues when he had his gallbladder taken out when he was in his late 59s"   GERD (gastroesophageal reflux disease)    Glaucoma    open angle, bilateral   High cholesterol    History of kidney stones    Hypercholesterolemia    Hypertension    Kidney cysts    Lumbago with sciatica, right side    Sepsis (HCC) 07/2016   d/t flu   Type II diabetes mellitus (HCC)    Vitamin D deficiency     Medications:  Medications Prior to Admission  Medication Sig Dispense Refill Last Dose   acetaminophen (TYLENOL) 650 MG CR tablet Take 650 mg by mouth every 8 (eight) hours as needed for pain.      albuterol (VENTOLIN HFA) 108 (90 Base) MCG/ACT inhaler Inhale into the lungs every 6 (six) hours as needed for wheezing or shortness of breath.      amLODipine (NORVASC) 5 MG tablet Take 5 mg by mouth daily.   Past Week   Artificial Tear Solution (SYSTANE CONTACTS OP) Apply 0.4 drops to eye as needed.      calcium  carbonate (TUMS - DOSED IN MG ELEMENTAL CALCIUM) 500 MG chewable tablet Chew 1 tablet by mouth daily.   11/07/2022   DULoxetine (CYMBALTA) 30 MG capsule Take 30 mg by mouth daily.   Past Week   famotidine (PEPCID) 20 MG tablet Take 20 mg by mouth daily.   Past Week   ferrous sulfate 325 (65 FE) MG EC tablet Take 325 mg by mouth daily.   Past Week   gabapentin (NEURONTIN) 300 MG capsule Take 300 mg by mouth every evening.   Past Week   glipiZIDE (GLUCOTROL XL) 5 MG 24 hr tablet Take 5 mg by mouth daily with breakfast.   Past Week   latanoprost (XALATAN) 0.005 % ophthalmic solution Place 1 drop into the right eye at bedtime.   Past Week   loratadine (CLARITIN) 10 MG tablet Take 10 mg by mouth daily as needed for allergies.   Past Week   LORazepam (ATIVAN) 0.5 MG tablet Take 0.5 mg by mouth every 8 (eight) hours.   11/07/2022   losartan (COZAAR) 100 MG tablet Take 0.5 tablets (50 mg total) by mouth daily. 30 tablet 1 Past Week   magnesium oxide (MAG-OX) 400 (240 Mg) MG tablet Take 800 mg by mouth 2 (two) times daily.  Past Week   ondansetron (ZOFRAN) 8 MG tablet Take 8 mg by mouth every 8 (eight) hours as needed for nausea/vomiting.      oxymetazoline (AFRIN) 0.05 % nasal spray Place 1 spray into both nostrils 2 (two) times daily.      QUEtiapine (SEROQUEL) 25 MG tablet Take 12.5 mg by mouth every other day.   Past Week   vitamin B-12 (CYANOCOBALAMIN) 1000 MCG tablet Take 1 tablet (1,000 mcg total) by mouth daily. 30 tablet 0 Past Week   Blood Glucose Monitoring Suppl (FREESTYLE LITE) DEVI USE UTD  0    FREESTYLE LITE test strip USE TO CHECK FASTING GLUCOSE IN THE MORNING AND 2 HOURS AFTER DINNER OR LUNCH  3    Lancets (FREESTYLE) lancets       Scheduled:   amLODipine  5 mg Oral Daily   cyanocobalamin  1,000 mcg Oral Daily   enoxaparin (LOVENOX) injection  80 mg Subcutaneous BID   gabapentin  300 mg Oral QPM   latanoprost  1 drop Right Eye QHS   LORazepam  0.5 mg Oral Q8H   Infusions:    cefTRIAXone (ROCEPHIN)  IV     metronidazole      Assessment: Pt admitted with splenic infarct. She was not on anticoagulation prior to admission. Lovenox has been ordered here.   Scr 1 Hgb 10.3, plt 585  Goal of Therapy:  Anti-Xa level 0.6-1 units/ml 4hrs after LMWH dose given Monitor platelets by anticoagulation protocol: Yes   Plan:  Lovenox 80mg  SQ BID F/u with PO AC CBC q72  Ulyses Southward, PharmD, BCIDP, AAHIVP, CPP Infectious Disease Pharmacist 11/08/2022 3:09 PM

## 2022-11-08 NOTE — Progress Notes (Signed)
Plan of Care Note for accepted transfer   Patient: Michaela Santana MRN: [id number]   DOA: 11/08/2022  Facility requesting transfer: [facility]. Requesting Provider:  Troy Sine  Reason for transfer: Vomiting, abnormal EKG Facility course:  Per Dr. Jodi Mourning:  "Chief Complaint  Patient presents with   Emesis      Michaela Santana is a 77 y.o. female.   Patient presents with recurrent nausea vomiting since yesterday with abdominal discomfort.  Patient had general weakness and more confused than normal baseline dementia.  No unilateral weakness.  Patient is cancer patient but not currently on chemo or radiation, focal lung cancer being monitored and not aggressive per family report.  Patient was admitted for similar 3 weeks ago.  No head injury.  No fevers or chills.  Mild congestion and cough no shortness of breath."  Medications Ordered in ED Medications  sodium chloride 0.9 % bolus 500 mL (500 mLs Intravenous New Bag/Given 11/08/22 0843)  pantoprazole (PROTONIX) injection 40 mg (40 mg Intravenous Given 11/08/22 0945)  sodium chloride 0.9 % bolus 500 mL (500 mLs Intravenous New Bag/Given 11/08/22 0950)  iohexol (OMNIPAQUE) 300 MG/ML solution 100 mL (80 mLs Intravenous Contrast Given 11/08/22 1002)  insulin aspart (novoLOG) injection 4 Units (4 Units Intravenous Given 11/08/22 1038)  ondansetron (ZOFRAN) injection 4 mg (4 mg Intravenous Given 11/08/22 1218)   Labwork: POC CBG, ED [[id number]] (Abnormal)   Collected: 11/08/22 1209   Updated: 11/08/22 1210   Specimen Type: Blood   Specimen Source: Finger, Right    Glucose-Capillary 246 High  mg/dL  Ammonia [[id number]]   Collected: 11/08/22 0933   Updated: 11/08/22 1004   Specimen Type: Blood    Ammonia 17 umol/L  Magnesium [[id number]]   Collected: 11/08/22 0811   Updated: 11/08/22 0913   Specimen Type: Blood    Magnesium 1.7 mg/dL  Lipase, blood [[id number]]   Collected: 11/08/22 0811   Updated: 11/08/22 0854   Specimen Type: Blood    Specimen Source: Vein    Lipase 18 U/L  Comprehensive metabolic panel [[id number]] (Abnormal)   Collected: 11/08/22 0811   Updated: 11/08/22 0854   Specimen Type: Blood   Specimen Source: Vein    Sodium 134 Low  mmol/L   Potassium 3.9 mmol/L   Chloride 94 Low  mmol/L   CO2 24 mmol/L   Glucose, Bld 354 High  mg/dL   BUN 15 mg/dL   Creatinine, Ser 5.36 mg/dL   Calcium 9.7 mg/dL   Total Protein 7.4 g/dL   Albumin 3.7 g/dL   AST 15 U/L   ALT 12 U/L   Alkaline Phosphatase 84 U/L   Total Bilirubin 0.6 mg/dL   GFR, Estimated 59 Low  mL/min   Anion gap 16 High   CBC [[id number]] (Abnormal)   Collected: 11/08/22 0811   Updated: 11/08/22 0816   Specimen Type: Blood   Specimen Source: Vein    WBC 16.8 High  K/uL   RBC 3.45 Low  MIL/uL   Hemoglobin 10.3 Low  g/dL   HCT 59.5 Low  %   MCV 94.2 fL   MCH 29.9 pg   MCHC 31.7 g/dL   RDW 63.8 %   Platelets 585 High  K/uL   nRBC 0.0 %    Imaging:  PORTABLE CHEST 1 VIEW   COMPARISON:  Chest radiograph dated 10/19/2022   FINDINGS: Normal lung volumes. Rounded retrocardiac opacity in keeping with known hiatal hernia. No new focal consolidations. No pleural effusion or pneumothorax. The heart size  and mediastinal contours are within normal limits. No acute osseous abnormality.   IMPRESSION: 1. No acute cardiopulmonary process. 2. Rounded retrocardiac opacity in keeping with known hiatal hernia.   CT abdomen/pelvis with contrast IMPRESSION: 1. New, wedge-shaped hypoattenuation of the spleen, suspicious for splenic infarct. 2. Persistent mural thickening of the duodenum with slightly decreased periduodenal stranding, likely improving duodenitis. 3. Innumerable bilateral pulmonary nodules in keeping with known neuroendocrine tumor. 4. Moderate hiatal hernia. 5. Aortic Atherosclerosis (ICD10-I70.0).   Electronically Signed   By: Agustin Cree M.D.   On: 11/08/2022 10:36   Plan of care: The patient is accepted for admission to  Telemetry unit, at [facility].  Author: Bobette Mo, MD 11/08/2022  Check [email or web address] for on-call coverage.  Nursing staff, Please call [facility] Admits & Consults System-Wide number on Amion as soon as patient's arrival, so appropriate admitting provider can evaluate the pt.

## 2022-11-08 NOTE — ED Triage Notes (Signed)
Pt arrived POV with daughter. Pt's daughter states pt has been vomiting since yesterday with decreased appetite. Pt has hx of dementia, daughter reports pt is more confused than normal, able to verbalize month/year most days, however pt alert to person only at present. Cancer pt, not currently on chemo/radiation. Pt's daughter denies pt hitting her head/recent falls.

## 2022-11-08 NOTE — ED Provider Notes (Signed)
[place] EMERGENCY DEPARTMENT AT [facility] Provider Note   CSN: [id number] Arrival date & time: 11/08/22  8295     History  Chief Complaint  Patient presents with   Emesis    Michaela Santana is a 77 y.o. female.  Patient presents with recurrent nausea vomiting since yesterday with abdominal discomfort.  Patient had general weakness and more confused than normal baseline dementia.  No unilateral weakness.  Patient is cancer patient but not currently on chemo or radiation, focal lung cancer being monitored and not aggressive per family report.  Patient was admitted for similar 3 weeks ago.  No head injury.  No fevers or chills.  Mild congestion and cough no shortness of breath.       Home Medications Prior to Admission medications   Medication Sig Start Date End Date Taking? Authorizing Provider  acetaminophen (TYLENOL) 650 MG CR tablet Take 650 mg by mouth every 8 (eight) hours as needed for pain.    [provider]  albuterol (VENTOLIN HFA) 108 (90 Base) MCG/ACT inhaler Inhale into the lungs every 6 (six) hours as needed for wheezing or shortness of breath.    [provider]  amLODipine (NORVASC) 5 MG tablet Take 5 mg by mouth daily. 01/20/20   [provider]  Artificial Tear Solution (SYSTANE CONTACTS OP) Apply 0.4 drops to eye as needed.    [provider]  Blood Glucose Monitoring Suppl (FREESTYLE LITE) DEVI USE UTD 07/26/16   [provider]  calcium carbonate (TUMS - DOSED IN MG ELEMENTAL CALCIUM) 500 MG chewable tablet Chew 1 tablet by mouth daily.    [provider]  DULoxetine (CYMBALTA) 30 MG capsule Take 30 mg by mouth daily.    [provider]  famotidine (PEPCID) 20 MG tablet Take 20 mg by mouth daily.    [provider]  ferrous sulfate 325 (65 FE) MG EC tablet Take 325 mg by mouth daily.    [provider]  FREESTYLE LITE test strip USE TO CHECK FASTING GLUCOSE IN THE MORNING  AND 2 HOURS AFTER DINNER OR LUNCH 07/26/16   [provider]  gabapentin (NEURONTIN) 300 MG capsule Take 300 mg by mouth every evening. 01/01/20   [provider]  glipiZIDE (GLUCOTROL XL) 5 MG 24 hr tablet Take 5 mg by mouth daily with breakfast. 08/13/16   [provider]  Lancets (FREESTYLE) lancets  07/26/16   [provider]  latanoprost (XALATAN) 0.005 % ophthalmic solution Place 1 drop into the right eye at bedtime. 09/21/22   [provider]  loratadine (CLARITIN) 10 MG tablet Take 10 mg by mouth daily as needed for allergies.    [provider]  LORazepam (ATIVAN) 0.5 MG tablet Take 0.5 mg by mouth every 8 (eight) hours.    [provider]  losartan (COZAAR) 100 MG tablet Take 0.5 tablets (50 mg total) by mouth daily. 10/21/22   Meredeth Ide, MD  magnesium oxide (MAG-OX) 400 (240 Mg) MG tablet Take 800 mg by mouth 2 (two) times daily. 06/23/21   [provider]  ondansetron (ZOFRAN) 8 MG tablet Take 8 mg by mouth every 8 (eight) hours as needed for nausea/vomiting. 09/14/21   [provider]  oxymetazoline (AFRIN) 0.05 % nasal spray Place 1 spray into both nostrils 2 (two) times daily.    [provider]  QUEtiapine (SEROQUEL) 25 MG tablet Take 12.5 mg by mouth every other day.    [provider]  vitamin B-12 (CYANOCOBALAMIN) 1000 MCG tablet Take 1 tablet (1,000 mcg total) by mouth daily. 10/29/21   Dimple Nanas, MD      Allergies    Codeine and Alprazolam    Review of Systems   Review of Systems  Unable to perform ROS: Mental status change    Physical Exam Updated Vital Signs BP (!) 123/91   Pulse 62   Temp 98 F (36.7 C)   Resp (!) 24   Ht 5\' 1"  (1.549 m)   Wt 81.2 kg   SpO2 97%   BMI 33.82 kg/m  Physical Exam Vitals and nursing note reviewed.  Constitutional:      General: She is not in acute distress.    Appearance: She is well-developed.  HENT:     Head: Normocephalic  and atraumatic.     Mouth/Throat:     Mouth: Mucous membranes are dry.  Eyes:     General:        Right eye: No discharge.        Left eye: No discharge.     Conjunctiva/sclera: Conjunctivae normal.  Neck:     Trachea: No tracheal deviation.  Cardiovascular:     Rate and Rhythm: Normal rate and regular rhythm.  Pulmonary:     Effort: Pulmonary effort is normal.     Breath sounds: Normal breath sounds.  Abdominal:     General: There is no distension.     Palpations: Abdomen is soft.     Tenderness: There is no abdominal tenderness. There is no guarding.  Musculoskeletal:        General: No swelling or tenderness. Normal range of motion.     Cervical back: Normal range of motion and neck supple. No rigidity.  Skin:    General: Skin is warm.     Capillary Refill: Capillary refill takes less than 2 seconds.     Findings: No rash.  Neurological:     General: No focal deficit present.     Mental Status: She is alert.     Cranial Nerves: No cranial nerve deficit.     Comments: Patient moves extremities equal, general weakness on exam.  Psychiatric:     Comments: Tired appearing, pleasant confusion     ED Results / Procedures / Treatments   Labs (all labs ordered are listed, but only abnormal results are displayed) Labs Reviewed  COMPREHENSIVE METABOLIC PANEL - Abnormal; Notable for the following components:      Result Value   Sodium 134 (*)    Chloride 94 (*)    Glucose, Bld 354 (*)    GFR, Estimated 59 (*)    Anion gap 16 (*)    All other components within normal limits  CBC - Abnormal; Notable for the following components:   WBC 16.8 (*)    RBC 3.45 (*)    Hemoglobin 10.3 (*)    HCT 32.5 (*)    Platelets 585 (*)    All other components within normal limits  CBG MONITORING, ED - Abnormal; Notable for the following components:   Glucose-Capillary 246 (*)    All other components within normal limits  LIPASE, BLOOD  MAGNESIUM  AMMONIA  URINALYSIS, ROUTINE W REFLEX  MICROSCOPIC    EKG None  Radiology CT ABDOMEN PELVIS W CONTRAST  Result Date: 11/08/2022 CLINICAL DATA:  One day history of vomiting associated with increased confusion EXAM: CT ABDOMEN AND PELVIS WITH CONTRAST TECHNIQUE: Multidetector CT imaging of the abdomen and pelvis was  performed using the standard protocol following bolus administration of intravenous contrast. RADIATION DOSE REDUCTION: This exam was performed according to the departmental dose-optimization program which includes automated exposure control, adjustment of the mA and/or kV according to patient size and/or use of iterative reconstruction technique. CONTRAST:  80mL OMNIPAQUE IOHEXOL 300 MG/ML  SOLN COMPARISON:  CTA abdomen and pelvis dated 10/17/2022 FINDINGS: Lower chest: Innumerable bilateral pulmonary nodules in keeping with known neuroendocrine tumor. No pleural effusion or pneumothorax demonstrated. Partially imaged heart size is normal. Hepatobiliary: Hypoattenuation along the falciform ligament may reflect perfusional variation or focal steatosis. no intra or extrahepatic biliary ductal dilation. Cholecystectomy. Pancreas: No focal lesions or main ductal dilation. Spleen: New, wedge-shaped hypoattenuation of the spleen. Adrenals/Urinary Tract: No adrenal nodules. No suspicious renal mass or hydronephrosis. Punctate nonobstructing left renal stone. No focal bladder wall thickening. Stomach/Bowel: Moderate hiatal hernia. Normal appearance of the stomach. Persistent mural thickening of the duodenal with slightly decreased periduodenal stranding. Duodenal diverticulum. Normal appendix. Vascular/Lymphatic: Aortic atherosclerosis. No enlarged abdominal or pelvic lymph nodes. Reproductive: No adnexal masses. Other: No free fluid, fluid collection, or free air. Musculoskeletal: No acute or abnormal lytic or blastic osseous lesions. Multilevel degenerative changes of the partially imaged thoracic and lumbar spine. IMPRESSION: 1. New,  wedge-shaped hypoattenuation of the spleen, suspicious for splenic infarct. 2. Persistent mural thickening of the duodenum with slightly decreased periduodenal stranding, likely improving duodenitis. 3. Innumerable bilateral pulmonary nodules in keeping with known neuroendocrine tumor. 4. Moderate hiatal hernia. 5. Aortic Atherosclerosis (ICD10-I70.0). Electronically Signed   By: Agustin Cree M.D.   On: 11/08/2022 10:36   DG Chest Portable 1 View  Result Date: 11/08/2022 CLINICAL DATA:  Cough and vomiting EXAM: PORTABLE CHEST 1 VIEW COMPARISON:  Chest radiograph dated 10/19/2022 FINDINGS: Normal lung volumes. Rounded retrocardiac opacity in keeping with known hiatal hernia. No new focal consolidations. No pleural effusion or pneumothorax. The heart size and mediastinal contours are within normal limits. No acute osseous abnormality. IMPRESSION: 1. No acute cardiopulmonary process. 2. Rounded retrocardiac opacity in keeping with known hiatal hernia. Electronically Signed   By: Agustin Cree M.D.   On: 11/08/2022 09:28    Procedures Procedures    Medications Ordered in ED Medications  sodium chloride 0.9 % bolus 500 mL (500 mLs Intravenous New Bag/Given 11/08/22 0843)  pantoprazole (PROTONIX) injection 40 mg (40 mg Intravenous Given 11/08/22 0945)  sodium chloride 0.9 % bolus 500 mL (500 mLs Intravenous New Bag/Given 11/08/22 0950)  iohexol (OMNIPAQUE) 300 MG/ML solution 100 mL (80 mLs Intravenous Contrast Given 11/08/22 1002)  insulin aspart (novoLOG) injection 4 Units (4 Units Intravenous Given 11/08/22 1038)  ondansetron (ZOFRAN) injection 4 mg (4 mg Intravenous Given 11/08/22 1218)    ED Course/ Medical Decision Making/ A&P                             Medical Decision Making Amount and/or Complexity of Data Reviewed Labs: ordered. Radiology: ordered. ECG/medicine tests: ordered.  Risk OTC drugs. Prescription drug management. Decision regarding hospitalization.   Patient presents with general  weakness, mild lethargy and clinical concern for dehydration.  Patient also having abdominal discomfort with vomiting differential includes viral/toxin mediated, gastric/reflux/ulcer, bowel obstruction, diverticulitis, colitis, other.  Plan for general blood work, IV fluid bolus, PPI, nausea med be ordered after EKG which was reviewed normal QT.  Sinus rhythm on EKG.    Medical records reviewed for discharge summary on May 12th describing UTI, diabetes, diarrhea  and renal mass.  Patient does have follow-up with oncology.  Updated daughters on plan of care and they are comfortable with this.  Blood work reviewed showing leukocytosis 16,000 possible from infection such as UTI or vomiting stress response, electrolytes reviewed showing mild hyponatremia secondary to hyperglycemia 350. EKG shows possible second-degree AV block, patient has had this in the past no syncope or chest pain at this time.  Glucose improved with fluids and insulin IV.  CT scan results independently reviewed showing mild inflammation duodenum and concern for splenic infarct.  Discussed with hospitalist for admission at [facility].       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

## 2022-11-08 NOTE — H&P (Signed)
TR[facility] H&P   Patient Demographics:    Michaela Santana, is a 77 y.o. female  MRN: [id number]   DOB - Jun 18, 1945  Admit Date - 11/08/2022  Outpatient Primary MD for the patient is Shon Hale, MD  Referring MD/NP/PA: Dr. Jodi Mourning  Outpatient Specialists: Oncology Dr. Shirline Frees, urology Dr. Mena Goes, cardiology Dr. Bufford Buttner  Patient coming from: Home  Chief Complaint  Patient presents with   Emesis      HPI:    Caren Starzyk  is a 77 y.o. female,  with medical history significant for stage IV low-grade neuroendocrine carcinoma, type 2 diabetes mellitus, hypertension, and chronic back pain, patient with recent hospitalization 5/7 until 5/12 this month for diarrhea, related to norovirus. -Patient presents to ED today secondary to complaints of nausea, vomiting and abdominal pain, she reports symptoms started Tuesday, she reports vomiting, she denies coffee-ground emesis, hematochezia, melena, diarrhea or constipation, no fever, no chills, she does report abdominal pain. -In ED CT abdomen pelvis significant for duodenitis, but as well noted for splenic infarct, significant for leukocytosis, thrombocytosis, and hyperglycemia, EKG showing Mobitz 2 heart block which is new (she is with known Mobitz 1 heart block.  [facility] hospitalist consulted to admit.    Review of systems:     A full 10 point Review of Systems was done, except as stated above, all other Review of Systems were negative.   With Past History of the following :    Past Medical History:  Diagnosis Date   Allergic rhinitis    Anemia    Arthritis    "probably in hands" (07/17/2016)   CKD (chronic kidney disease), stage III (HCC)    Depression    Esophageal reflux    Family history of adverse reaction to anesthesia    "her father had some issues when he had his gallbladder taken out when he was in his late 65s"   GERD  (gastroesophageal reflux disease)    Glaucoma    open angle, bilateral   High cholesterol    History of kidney stones    Hypercholesterolemia    Hypertension    Kidney cysts    Lumbago with sciatica, right side    Sepsis (HCC) 07/2016   d/t flu   Type II diabetes mellitus (HCC)    Vitamin D deficiency       Past Surgical History:  Procedure Laterality Date   BREAST BIOPSY  1990   "? side; it wasn't anything"   BRONCHIAL BIOPSY  08/01/2021   Procedure: BRONCHIAL BIOPSIES;  Surgeon: Josephine Igo, DO;  Location: [facility];  Service: Pulmonary;;   BRONCHIAL NEEDLE ASPIRATION BIOPSY  08/01/2021   Procedure: BRONCHIAL NEEDLE ASPIRATION BIOPSIES;  Surgeon: Josephine Igo, DO;  Location: [facility];  Service: Pulmonary;;   CATARACT EXTRACTION, BILATERAL  2021   CHOLECYSTECTOMY OPEN  1971   COLONOSCOPY     2001,2011,2023  TUBAL LIGATION  1978   VIDEO BRONCHOSCOPY WITH RADIAL ENDOBRONCHIAL ULTRASOUND  08/01/2021   Procedure: RADIAL ENDOBRONCHIAL ULTRASOUND;  Surgeon: Josephine Igo, DO;  Location: [facility];  Service: Pulmonary;;      Social History:     Social History   Tobacco Use   Smoking status: Never   Smokeless tobacco: Never  Substance Use Topics   Alcohol use: Never       Family History :     Family History  Problem Relation Age of Onset   Dementia Mother    Diabetes Mother    Stroke Father    Hypertension Father    CVA Father    Diabetes Sister    Hypertension Sister    Heart disease Paternal Grandfather    Breast cancer Neg Hx       Home Medications:   Prior to Admission medications   Medication Sig Start Date End Date Taking? Authorizing Provider  acetaminophen (TYLENOL) 650 MG CR tablet Take 650 mg by mouth every 8 (eight) hours as needed for pain.   Yes [provider]  albuterol (VENTOLIN HFA) 108 (90 Base) MCG/ACT inhaler Inhale into the lungs every 6 (six) hours as needed for wheezing or shortness of breath.   Yes  [provider]  amLODipine (NORVASC) 5 MG tablet Take 5 mg by mouth daily. 01/20/20  Yes [provider]  Artificial Tear Solution (SYSTANE CONTACTS OP) Apply 0.4 drops to eye as needed.   Yes [provider]  calcium carbonate (TUMS - DOSED IN MG ELEMENTAL CALCIUM) 500 MG chewable tablet Chew 1 tablet by mouth daily.   Yes [provider]  DULoxetine (CYMBALTA) 30 MG capsule Take 30 mg by mouth daily.   Yes [provider]  famotidine (PEPCID) 20 MG tablet Take 20 mg by mouth daily.   Yes [provider]  ferrous sulfate 325 (65 FE) MG EC tablet Take 325 mg by mouth daily.   Yes [provider]  gabapentin (NEURONTIN) 300 MG capsule Take 300 mg by mouth every evening. 01/01/20  Yes [provider]  glipiZIDE (GLUCOTROL XL) 5 MG 24 hr tablet Take 5 mg by mouth daily with breakfast. 08/13/16  Yes [provider]  latanoprost (XALATAN) 0.005 % ophthalmic solution Place 1 drop into the right eye at bedtime. 09/21/22  Yes [provider]  loratadine (CLARITIN) 10 MG tablet Take 10 mg by mouth daily as needed for allergies.   Yes [provider]  LORazepam (ATIVAN) 0.5 MG tablet Take 0.5 mg by mouth every 8 (eight) hours.   Yes [provider]  losartan (COZAAR) 100 MG tablet Take 0.5 tablets (50 mg total) by mouth daily. 10/21/22  Yes Meredeth Ide, MD  magnesium oxide (MAG-OX) 400 (240 Mg) MG tablet Take 800 mg by mouth 2 (two) times daily. 06/23/21  Yes [provider]  ondansetron (ZOFRAN) 8 MG tablet Take 8 mg by mouth every 8 (eight) hours as needed for nausea/vomiting. 09/14/21  Yes [provider]  oxymetazoline (AFRIN) 0.05 % nasal spray Place 1 spray into both nostrils 2 (two) times daily.   Yes [provider]  QUEtiapine (SEROQUEL) 25 MG tablet Take 12.5 mg by mouth every other day.   Yes [provider]  vitamin B-12 (CYANOCOBALAMIN) 1000 MCG tablet Take 1  tablet (1,000 mcg total) by mouth daily. 10/29/21  Yes Amin, Loura Halt, MD  Blood Glucose Monitoring Suppl (FREESTYLE LITE) DEVI USE UTD 07/26/16  [provider]  FREESTYLE LITE test strip USE TO CHECK FASTING GLUCOSE IN THE MORNING AND 2 HOURS AFTER DINNER OR LUNCH 07/26/16   [provider]  Lancets (FREESTYLE) lancets  07/26/16   [provider]     Allergies:     Allergies  Allergen Reactions   Codeine Hypertension   Alprazolam Other (See Comments)    lethargic     Physical Exam:   Vitals  Blood pressure (!) 133/57, pulse 66, temperature 98 F (36.7 C), resp. rate (!) 27, height 5\' 1"  (1.549 m), weight 81.2 kg, SpO2 95 %.   1. General frail, ill-appearing female, laying in bed in mild discomfort due to nausea and vomiting  2. Normal affect and insight, Not Suicidal or Homicidal, Awake Alert, Oriented X 3.  3. No F.N deficits, ALL C.Nerves Intact, Strength 5/5 all 4 extremities, Sensation intact all 4 extremities, Plantars down going.  4. Ears and Eyes appear Normal, Conjunctivae clear, PERRLA. Moist Oral Mucosa.  5. Supple Neck, No JVD, No cervical lymphadenopathy appriciated, No Carotid Bruits.  6. Symmetrical Chest wall movement, Good air movement bilaterally, CTAB.  7. RRR, No Gallops, Rubs or Murmurs, No Parasternal Heave.  8. Positive Bowel Sounds, Abdomen Soft, has epigastric tenderness, No organomegaly appriciated,No rebound -guarding or rigidity.  9.  No Cyanosis, Normal Skin Turgor, No Skin Rash or Bruise.  10. Good muscle tone,  joints appear normal , no effusions, Normal ROM.   Data Review:    CBC Recent Labs  Lab 11/07/22 1032 11/08/22 0811  WBC 10.0 16.8*  HGB 11.0* 10.3*  HCT 34.6* 32.5*  PLT 564* 585*  MCV 95.8 94.2  MCH 30.5 29.9  MCHC 31.8 31.7  RDW 13.2 13.3  LYMPHSABS 1.2  --   MONOABS 0.5  --   EOSABS 0.1  --   BASOSABS 0.1  --     ------------------------------------------------------------------------------------------------------------------  Chemistries  Recent Labs  Lab 11/07/22 1032 11/08/22 0811  NA 136 134*  K 3.6 3.9  CL 100 94*  CO2 25 24  GLUCOSE 261* 354*  BUN 9 15  CREATININE 0.85 1.00  CALCIUM 9.7 9.7  MG  --  1.7  AST 12* 15  ALT 13 12  ALKPHOS 101 84  BILITOT 0.5 0.6   ------------------------------------------------------------------------------------------------------------------ estimated creatinine clearance is 47 mL/min (by C-G formula based on SCr of 1 mg/dL). ------------------------------------------------------------------------------------------------------------------ No results for input(s): "TSH", "T4TOTAL", "T3FREE", "THYROIDAB" in the last 72 hours.  Invalid input(s): "FREET3"  Coagulation profile No results for input(s): "INR", "PROTIME" in the last 168 hours. ------------------------------------------------------------------------------------------------------------------- No results for input(s): "DDIMER" in the last 72 hours. -------------------------------------------------------------------------------------------------------------------  Cardiac Enzymes No results for input(s): "CKMB", "TROPONINI", "MYOGLOBIN" in the last 168 hours.  Invalid input(s): "CK" ------------------------------------------------------------------------------------------------------------------    Component Value Date/Time   BNP 38.3 04/07/2020 0932   BNP 29.3 07/17/2016 2110     ---------------------------------------------------------------------------------------------------------------  Urinalysis    Component Value Date/Time   COLORURINE YELLOW 10/26/2021 1610   APPEARANCEUR CLEAR 10/26/2021 1610   LABSPEC 1.032 (H) 10/26/2021 1610   PHURINE 6.0 10/26/2021 1610   GLUCOSEU NEGATIVE 10/26/2021 1610   HGBUR NEGATIVE 10/26/2021 1610   BILIRUBINUR NEGATIVE 10/26/2021 1610    KETONESUR NEGATIVE 10/26/2021 1610   PROTEINUR 30 (A) 10/26/2021 1610   NITRITE NEGATIVE 10/26/2021 1610   LEUKOCYTESUR NEGATIVE 10/26/2021 1610    ----------------------------------------------------------------------------------------------------------------   Imaging Results:    CT ABDOMEN PELVIS W CONTRAST  Result Date: 11/08/2022 CLINICAL DATA:  One day history of vomiting  associated with increased confusion EXAM: CT ABDOMEN AND PELVIS WITH CONTRAST TECHNIQUE: Multidetector CT imaging of the abdomen and pelvis was performed using the standard protocol following bolus administration of intravenous contrast. RADIATION DOSE REDUCTION: This exam was performed according to the departmental dose-optimization program which includes automated exposure control, adjustment of the mA and/or kV according to patient size and/or use of iterative reconstruction technique. CONTRAST:  80mL OMNIPAQUE IOHEXOL 300 MG/ML  SOLN COMPARISON:  CTA abdomen and pelvis dated 10/17/2022 FINDINGS: Lower chest: Innumerable bilateral pulmonary nodules in keeping with known neuroendocrine tumor. No pleural effusion or pneumothorax demonstrated. Partially imaged heart size is normal. Hepatobiliary: Hypoattenuation along the falciform ligament may reflect perfusional variation or focal steatosis. no intra or extrahepatic biliary ductal dilation. Cholecystectomy. Pancreas: No focal lesions or main ductal dilation. Spleen: New, wedge-shaped hypoattenuation of the spleen. Adrenals/Urinary Tract: No adrenal nodules. No suspicious renal mass or hydronephrosis. Punctate nonobstructing left renal stone. No focal bladder wall thickening. Stomach/Bowel: Moderate hiatal hernia. Normal appearance of the stomach. Persistent mural thickening of the duodenal with slightly decreased periduodenal stranding. Duodenal diverticulum. Normal appendix. Vascular/Lymphatic: Aortic atherosclerosis. No enlarged abdominal or pelvic lymph nodes.  Reproductive: No adnexal masses. Other: No free fluid, fluid collection, or free air. Musculoskeletal: No acute or abnormal lytic or blastic osseous lesions. Multilevel degenerative changes of the partially imaged thoracic and lumbar spine. IMPRESSION: 1. New, wedge-shaped hypoattenuation of the spleen, suspicious for splenic infarct. 2. Persistent mural thickening of the duodenum with slightly decreased periduodenal stranding, likely improving duodenitis. 3. Innumerable bilateral pulmonary nodules in keeping with known neuroendocrine tumor. 4. Moderate hiatal hernia. 5. Aortic Atherosclerosis (ICD10-I70.0). Electronically Signed   By: Agustin Cree M.D.   On: 11/08/2022 10:36   DG Chest Portable 1 View  Result Date: 11/08/2022 CLINICAL DATA:  Cough and vomiting EXAM: PORTABLE CHEST 1 VIEW COMPARISON:  Chest radiograph dated 10/19/2022 FINDINGS: Normal lung volumes. Rounded retrocardiac opacity in keeping with known hiatal hernia. No new focal consolidations. No pleural effusion or pneumothorax. The heart size and mediastinal contours are within normal limits. No acute osseous abnormality. IMPRESSION: 1. No acute cardiopulmonary process. 2. Rounded retrocardiac opacity in keeping with known hiatal hernia. Electronically Signed   By: Agustin Cree M.D.   On: 11/08/2022 09:28     EKG:  Vent. rate 68 BPM PR interval 260 ms QRS duration 85 ms QT/QTcB 385/410 ms P-R-T axes 0 -2 75 Second degree AV block, Mobitz II Supraventricular bigeminy Left atrial enlargement Borderline T wave abnormalities  Assessment & Plan:    Active Problems:   Congestive heart failure (HCC)   Intractable nausea and vomiting   Hypertension   Renal mass   Intractable nausea and vomiting secondary to duodenitis -Patient presents with nausea, vomiting, abdominal pain, CT abdomen pelvis significant for duodenitis -Will keep on clear liquid diet, advance as tolerated -clinically dehydrated, continue with IV fluids -Keep on as  needed nausea medication -As needed pain meds -Will start on IV Rocephin and azithromycin  Splenic infarct -Has elevated and on imaging, this is most likely in the setting of hypercoagulable state in the setting of known neuroendocrine malignancy, and possible renal cell carcinoma -will initiate full anticoagulation, currently with Lovenox, and then transition to Eliquis once more stable  Type 2 diabetes mellitus -Will keep an insulin sliding scale during hospital stay, will hold oral agents  Carcinoid tumor -Stage IV, low-grade neuroendocrine carcinoma, followed by Dr. Shirline Frees, will with innumerable pulmonary nodules, current plan for observation  Renal cell mass -  Followed by Dr. Mena Goes  Leukocytosis -  due to above, continue to trend, monitor with IV fluids  Thrombocytosis -Due to infection, continue with IV fluids  Pretension -Continue with Norvasc, hold losartan  Second-degree AV block, Mobitz 2 -This appears to be progressive from her baseline Mobitz 1, well consult cardiology  Dehydration -continue with IV fluid     DVT Prophylaxis : Full dose Lovenox  AM Labs Ordered, also please review Full Orders  Family Communication: Admission, patients condition and plan of care including tests being ordered have been discussed with the patient and daughter at bedside who indicate understanding and agree with the plan and Code Status.  Code Status full code  Likely DC to home  Condition GUARDED  Consults called: None  Admission status: Observation  Time spent in minutes : 70 minutes   Huey Bienenstock M.D on 11/08/2022 at 2:51 PM   [facility] - Office  [phone]

## 2022-11-08 NOTE — ED Notes (Addendum)
Patient transported to CT 

## 2022-11-08 NOTE — ED Notes (Signed)
Called [facility] for transport spoke with george

## 2022-11-09 ENCOUNTER — Other Ambulatory Visit: Payer: Self-pay

## 2022-11-09 ENCOUNTER — Other Ambulatory Visit: Payer: Self-pay | Admitting: Medical Oncology

## 2022-11-09 DIAGNOSIS — I441 Atrioventricular block, second degree: Secondary | ICD-10-CM | POA: Diagnosis present

## 2022-11-09 DIAGNOSIS — I130 Hypertensive heart and chronic kidney disease with heart failure and stage 1 through stage 4 chronic kidney disease, or unspecified chronic kidney disease: Secondary | ICD-10-CM | POA: Diagnosis present

## 2022-11-09 DIAGNOSIS — I110 Hypertensive heart disease with heart failure: Secondary | ICD-10-CM | POA: Diagnosis not present

## 2022-11-09 DIAGNOSIS — K260 Acute duodenal ulcer with hemorrhage: Secondary | ICD-10-CM | POA: Diagnosis not present

## 2022-11-09 DIAGNOSIS — K922 Gastrointestinal hemorrhage, unspecified: Secondary | ICD-10-CM | POA: Diagnosis not present

## 2022-11-09 DIAGNOSIS — K921 Melena: Secondary | ICD-10-CM | POA: Diagnosis not present

## 2022-11-09 DIAGNOSIS — Z833 Family history of diabetes mellitus: Secondary | ICD-10-CM | POA: Diagnosis not present

## 2022-11-09 DIAGNOSIS — Z8249 Family history of ischemic heart disease and other diseases of the circulatory system: Secondary | ICD-10-CM | POA: Diagnosis not present

## 2022-11-09 DIAGNOSIS — R918 Other nonspecific abnormal finding of lung field: Secondary | ICD-10-CM | POA: Diagnosis present

## 2022-11-09 DIAGNOSIS — R112 Nausea with vomiting, unspecified: Secondary | ICD-10-CM | POA: Diagnosis present

## 2022-11-09 DIAGNOSIS — C642 Malignant neoplasm of left kidney, except renal pelvis: Secondary | ICD-10-CM | POA: Diagnosis present

## 2022-11-09 DIAGNOSIS — K449 Diaphragmatic hernia without obstruction or gangrene: Secondary | ICD-10-CM | POA: Diagnosis present

## 2022-11-09 DIAGNOSIS — F410 Panic disorder [episodic paroxysmal anxiety] without agoraphobia: Secondary | ICD-10-CM | POA: Diagnosis present

## 2022-11-09 DIAGNOSIS — F05 Delirium due to known physiological condition: Secondary | ICD-10-CM | POA: Diagnosis not present

## 2022-11-09 DIAGNOSIS — D62 Acute posthemorrhagic anemia: Secondary | ICD-10-CM | POA: Diagnosis present

## 2022-11-09 DIAGNOSIS — E860 Dehydration: Secondary | ICD-10-CM | POA: Diagnosis present

## 2022-11-09 DIAGNOSIS — D735 Infarction of spleen: Secondary | ICD-10-CM

## 2022-11-09 DIAGNOSIS — I509 Heart failure, unspecified: Secondary | ICD-10-CM | POA: Diagnosis present

## 2022-11-09 DIAGNOSIS — K269 Duodenal ulcer, unspecified as acute or chronic, without hemorrhage or perforation: Secondary | ICD-10-CM | POA: Diagnosis not present

## 2022-11-09 DIAGNOSIS — F0394 Unspecified dementia, unspecified severity, with anxiety: Secondary | ICD-10-CM | POA: Diagnosis present

## 2022-11-09 DIAGNOSIS — K2980 Duodenitis without bleeding: Secondary | ICD-10-CM | POA: Diagnosis present

## 2022-11-09 DIAGNOSIS — E0865 Diabetes mellitus due to underlying condition with hyperglycemia: Secondary | ICD-10-CM | POA: Diagnosis present

## 2022-11-09 DIAGNOSIS — D75839 Thrombocytosis, unspecified: Secondary | ICD-10-CM | POA: Diagnosis present

## 2022-11-09 DIAGNOSIS — C7A8 Other malignant neuroendocrine tumors: Secondary | ICD-10-CM | POA: Diagnosis present

## 2022-11-09 DIAGNOSIS — E7800 Pure hypercholesterolemia, unspecified: Secondary | ICD-10-CM | POA: Diagnosis present

## 2022-11-09 DIAGNOSIS — E1149 Type 2 diabetes mellitus with other diabetic neurological complication: Secondary | ICD-10-CM | POA: Diagnosis not present

## 2022-11-09 DIAGNOSIS — F32A Depression, unspecified: Secondary | ICD-10-CM | POA: Diagnosis present

## 2022-11-09 DIAGNOSIS — G9341 Metabolic encephalopathy: Secondary | ICD-10-CM | POA: Diagnosis present

## 2022-11-09 DIAGNOSIS — Z8619 Personal history of other infectious and parasitic diseases: Secondary | ICD-10-CM | POA: Diagnosis not present

## 2022-11-09 DIAGNOSIS — N183 Chronic kidney disease, stage 3 unspecified: Secondary | ICD-10-CM | POA: Diagnosis present

## 2022-11-09 DIAGNOSIS — K264 Chronic or unspecified duodenal ulcer with hemorrhage: Secondary | ICD-10-CM | POA: Diagnosis present

## 2022-11-09 LAB — BPAM RBC
ISSUE DATE / TIME: 202405311243
Unit Type and Rh: 6200
Unit Type and Rh: 6200

## 2022-11-09 LAB — COMPREHENSIVE METABOLIC PANEL
ALT: 14 U/L (ref 0–44)
AST: 8 U/L — ABNORMAL LOW (ref 15–41)
Albumin: 2.2 g/dL — ABNORMAL LOW (ref 3.5–5.0)
Alkaline Phosphatase: 59 U/L (ref 38–126)
Anion gap: 11 (ref 5–15)
BUN: 29 mg/dL — ABNORMAL HIGH (ref 8–23)
CO2: 25 mmol/L (ref 22–32)
Calcium: 8.3 mg/dL — ABNORMAL LOW (ref 8.9–10.3)
Chloride: 101 mmol/L (ref 98–111)
Creatinine, Ser: 1.18 mg/dL — ABNORMAL HIGH (ref 0.44–1.00)
GFR, Estimated: 48 mL/min — ABNORMAL LOW (ref >60)
Glucose, Bld: 261 mg/dL — ABNORMAL HIGH (ref 70–99)
Potassium: 3.5 mmol/L (ref 3.5–5.1)
Sodium: 137 mmol/L (ref 135–145)
Total Bilirubin: 0.4 mg/dL (ref 0.3–1.2)
Total Protein: 5.1 g/dL — ABNORMAL LOW (ref 6.5–8.1)

## 2022-11-09 LAB — CBC
HCT: 22 % — ABNORMAL LOW (ref 36.0–46.0)
HCT: 24.5 % — ABNORMAL LOW (ref 36.0–46.0)
Hemoglobin: 6.9 g/dL — CL (ref 12.0–15.0)
Hemoglobin: 7.7 g/dL — ABNORMAL LOW (ref 12.0–15.0)
MCH: 30.8 pg (ref 26.0–34.0)
MCH: 30.6 pg (ref 26.0–34.0)
MCHC: 31.4 g/dL (ref 30.0–36.0)
MCHC: 31.4 g/dL (ref 30.0–36.0)
MCV: 98.2 fL (ref 80.0–100.0)
MCV: 97.2 fL (ref 80.0–100.0)
Platelets: 424 10*3/uL — ABNORMAL HIGH (ref 150–400)
Platelets: 382 10*3/uL (ref 150–400)
RBC: 2.24 MIL/uL — ABNORMAL LOW (ref 3.87–5.11)
RBC: 2.52 MIL/uL — ABNORMAL LOW (ref 3.87–5.11)
RDW: 13.4 % (ref 11.5–15.5)
RDW: 14.8 % (ref 11.5–15.5)
WBC: 14.5 10*3/uL — ABNORMAL HIGH (ref 4.0–10.5)
WBC: 10.3 10*3/uL (ref 4.0–10.5)
nRBC: 0 % (ref 0.0–0.2)
nRBC: 0 % (ref 0.0–0.2)

## 2022-11-09 LAB — TYPE AND SCREEN
ABO/RH(D): A POS
Antibody Screen: NEGATIVE

## 2022-11-09 LAB — OCCULT BLOOD X 1 CARD TO LAB, STOOL: Fecal Occult Bld: POSITIVE — AB

## 2022-11-09 LAB — PREPARE RBC (CROSSMATCH)

## 2022-11-09 LAB — GLUCOSE, CAPILLARY
Glucose-Capillary: 205 mg/dL — ABNORMAL HIGH (ref 70–99)
Glucose-Capillary: 163 mg/dL — ABNORMAL HIGH (ref 70–99)
Glucose-Capillary: 169 mg/dL — ABNORMAL HIGH (ref 70–99)
Glucose-Capillary: 186 mg/dL — ABNORMAL HIGH (ref 70–99)

## 2022-11-09 LAB — ABO/RH: ABO/RH(D): A POS

## 2022-11-09 LAB — HEMOGLOBIN AND HEMATOCRIT, BLOOD
HCT: 24 % — ABNORMAL LOW (ref 36.0–46.0)
Hemoglobin: 7.3 g/dL — ABNORMAL LOW (ref 12.0–15.0)

## 2022-11-09 MED ORDER — SODIUM CHLORIDE 0.9% FLUSH
10.0000 mL | Freq: Two times a day (BID) | INTRAVENOUS | Status: DC
  Administered 2022-11-09 – 2022-11-12 (×6): 10 mL

## 2022-11-09 MED ORDER — MAGNESIUM SULFATE 2 GM/50ML IV SOLN
2.0000 g | Freq: Once | INTRAVENOUS | Status: AC
  Administered 2022-11-09: 2 g via INTRAVENOUS
  Filled 2022-11-09: qty 50

## 2022-11-09 MED ORDER — SODIUM CHLORIDE 0.9% IV SOLUTION: Freq: Once | INTRAVENOUS | Status: AC

## 2022-11-09 MED ORDER — PANTOPRAZOLE SODIUM 40 MG IV SOLR
40.0000 mg | Freq: Two times a day (BID) | INTRAVENOUS | Status: DC
  Administered 2022-11-09 – 2022-11-10 (×3): 40 mg via INTRAVENOUS
  Filled 2022-11-09 (×3): qty 10

## 2022-11-09 MED ORDER — POTASSIUM CHLORIDE CRYS ER 20 MEQ PO TBCR
20.0000 meq | EXTENDED_RELEASE_TABLET | Freq: Once | ORAL | Status: AC
  Administered 2022-11-09: 20 meq via ORAL
  Filled 2022-11-09: qty 1

## 2022-11-09 MED ORDER — CHLORHEXIDINE GLUCONATE CLOTH 2 % EX PADS
6.0000 | MEDICATED_PAD | Freq: Every day | CUTANEOUS | Status: DC
  Administered 2022-11-09 – 2022-11-12 (×4): 6 via TOPICAL

## 2022-11-09 MED ORDER — SODIUM CHLORIDE 0.9% FLUSH: 10.0000 mL | INTRAVENOUS | Status: DC | PRN

## 2022-11-09 NOTE — Progress Notes (Deleted)
err

## 2022-11-09 NOTE — Consult Note (Signed)
Referring Provider:  [facility] Primary Care Physician:  Shon Hale, MD Primary Gastroenterologist:  Dr. Bosie Clos  Reason for Consultation:  Anemia, GI bleed   HPI: Michaela Santana is a 77 y.o. 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  Blood work this morning showed drop in hemoglobin to 6.9.  Normal LFTs.  Patient seen and examined at bedside.  Patient's daughter at bedside.  Patient lives with the daughter but according to daughter, she is not sure of any blood in the stool or black stool.  Patient usually goes to bathroom by herself and does not get assisted by the daughter.  Had 3 episodes of nonbloody vomiting.  Past Medical History:  Diagnosis Date   Allergic rhinitis    Anemia    Arthritis    "probably in hands" (07/17/2016)   CKD (chronic kidney disease), stage III (HCC)    Depression    Esophageal reflux    Family history of adverse reaction to anesthesia    "her father had some issues when he had his gallbladder taken out when he was in his late 4s"   GERD (gastroesophageal reflux disease)    Glaucoma    open angle, bilateral   High cholesterol    History of kidney stones    Hypercholesterolemia    Hypertension    Kidney cysts    Lumbago with sciatica, right side    Sepsis (HCC) 07/2016   d/t flu   Type II diabetes mellitus (HCC)    Vitamin D  deficiency     Past Surgical History:  Procedure Laterality Date   BREAST BIOPSY  1990   "? side; it wasn't anything"   BRONCHIAL BIOPSY  08/01/2021   Procedure: BRONCHIAL BIOPSIES;  Surgeon: Josephine Igo, DO;  Location: [facility];  Service: Pulmonary;;   BRONCHIAL NEEDLE ASPIRATION BIOPSY  08/01/2021   Procedure: BRONCHIAL NEEDLE ASPIRATION BIOPSIES;  Surgeon: Josephine Igo, DO;  Location: [facility];  Service: Pulmonary;;   CATARACT EXTRACTION, BILATERAL  2021   CHOLECYSTECTOMY OPEN  1971   COLONOSCOPY     2001,2011,2023   TUBAL LIGATION  1978   VIDEO BRONCHOSCOPY WITH RADIAL ENDOBRONCHIAL ULTRASOUND  08/01/2021   Procedure: RADIAL ENDOBRONCHIAL ULTRASOUND;  Surgeon: Josephine Igo, DO;  Location: [facility];  Service: Pulmonary;;    Prior to Admission medications   Medication Sig Start Date End Date Taking? Authorizing Provider  acetaminophen (TYLENOL) 650 MG CR tablet Take 650 mg by mouth every 8 (eight) hours as needed for pain.   Yes [provider]  albuterol (VENTOLIN HFA) 108 (90 Base) MCG/ACT inhaler Inhale into the lungs every 6 (six) hours as needed for wheezing or shortness of breath.   Yes [provider]  amLODipine (NORVASC) 5 MG tablet Take 5 mg by mouth daily. 01/20/20  Yes [provider]  Artificial Tear Solution (SYSTANE CONTACTS OP) Apply 0.4 drops to eye as needed.   Yes [provider]  calcium carbonate (TUMS - DOSED IN MG ELEMENTAL CALCIUM) 500 MG chewable tablet Chew 1 tablet by mouth daily.   Yes [provider]  DULoxetine (CYMBALTA) 30 MG capsule Take 30 mg by mouth daily.   Yes [provider]  famotidine (PEPCID) 20 MG tablet Take 20 mg by mouth daily.   Yes [provider]  ferrous sulfate 325 (65 FE) MG EC tablet Take 325 mg by mouth daily.   Yes [provider]  gabapentin (NEURONTIN) 300 MG capsule Take 300 mg by mouth every evening. 01/01/20  Yes [provider]  glipiZIDE (GLUCOTROL XL) 5 MG 24 hr tablet Take 5 mg by mouth daily with breakfast. 08/13/16  Yes [provider]  latanoprost (XALATAN) 0.005 % ophthalmic solution Place 1 drop into the right eye at bedtime. 09/21/22  Yes [provider]  loratadine (CLARITIN) 10 MG tablet Take 10 mg by mouth daily as needed for allergies.   Yes [provider]  LORazepam (ATIVAN) 0.5 MG tablet Take 0.5 mg by mouth every 8 (eight) hours.   Yes [provider]  losartan (COZAAR) 100 MG tablet Take 0.5 tablets (50 mg total) by mouth daily. 10/21/22  Yes Meredeth Ide, MD  magnesium oxide (MAG-OX) 400 (240 Mg) MG tablet Take 800 mg by mouth 2 (two) times daily. 06/23/21  Yes [provider]  ondansetron (ZOFRAN) 8 MG tablet Take 8 mg by mouth every 8 (eight) hours as needed for nausea/vomiting. 09/14/21  Yes [provider]  oxymetazoline (AFRIN) 0.05 % nasal spray Place 1 spray into both nostrils 2 (two) times daily.   Yes [provider]  QUEtiapine (SEROQUEL) 25 MG tablet Take 12.5 mg by mouth every other day.   Yes [provider]  vitamin B-12 (CYANOCOBALAMIN) 1000 MCG tablet Take 1 tablet (1,000 mcg total) by mouth daily. 10/29/21  Yes Amin, Loura Halt, MD  Blood Glucose Monitoring Suppl (FREESTYLE LITE) DEVI USE UTD 07/26/16   [provider]  FREESTYLE LITE test strip USE TO CHECK FASTING GLUCOSE IN THE MORNING AND 2 HOURS AFTER DINNER OR LUNCH 07/26/16   [provider]  Lancets (FREESTYLE) lancets  07/26/16   [provider]    Scheduled Meds:  sodium chloride   Intravenous Once   cyanocobalamin  1,000 mcg Oral Daily   gabapentin  300 mg Oral QPM   insulin aspart  0-15 Units Subcutaneous TID WC   insulin aspart  0-5 Units Subcutaneous QHS   latanoprost  1 drop Right Eye QHS   LORazepam  0.5 mg Intravenous Q8H   pantoprazole (PROTONIX) IV  40 mg Intravenous Q12H   Continuous Infusions:   cefTRIAXone (ROCEPHIN)  IV 2 g (11/08/22 1543)   lactated ringers 75 mL/hr at 11/09/22 0721   metronidazole 500 mg (11/09/22 0411)   PRN Meds:.albuterol, hydrALAZINE, morphine injection, ondansetron (ZOFRAN) IV, oxyCODONE, phenol  Allergies as of 11/08/2022 - Review Complete 11/08/2022  Allergen Reaction Noted   Codeine Hypertension 07/17/2016   Alprazolam Other (See Comments) 02/15/2017    Family History  Problem Relation Age of Onset   Dementia Mother    Diabetes Mother    Stroke Father    Hypertension Father    CVA Father    Diabetes Sister    Hypertension Sister    Heart disease Paternal Grandfather    Breast cancer Neg Hx     Social History   Socioeconomic History   Marital status: Widowed  Spouse name: Not on file   Number of children: 2   Years of education: Not on file   Highest education level: Some college, no degree  Occupational History   Occupation: Geophysicist/field seismologist  Tobacco Use   Smoking status: Never   Smokeless tobacco: Never  Vaping Use   Vaping Use: Never used  Substance and Sexual Activity   Alcohol use: Never   Drug use: Never   Sexual activity: Not on file  Other Topics Concern   Not on file  Social History Narrative   02/08/20 Lives alone   caffeine soda 1-2 daily   Social Determinants of Health   Financial Resource Strain: Not on file  Food Insecurity: No Food Insecurity (11/08/2022)   Hunger Vital Sign    Worried About Running Out of Food in the Last Year: Never true    Ran Out of Food in the Last Year: Never true  Transportation Needs: No Transportation Needs (11/08/2022)   PRAPARE - Administrator, Civil Service (Medical): No    Lack of Transportation (Non-Medical): No  Physical Activity: Not on file  Stress: Not on file  Social Connections: Not on file  Intimate Partner Violence: Not At Risk (11/08/2022)   Humiliation, Afraid, Rape, and Kick questionnaire    Fear of Current or Ex-Partner: No    Emotionally Abused: No     Physically Abused: No    Sexually Abused: No    Review of Systems: All negative except as stated above in HPI.  Physical Exam: Vital signs: Vitals:   11/09/22 0515 11/09/22 0811  BP: 103/64 (!) 116/48  Pulse: 77   Resp: 15   Temp:  99.5 F (37.5 C)  SpO2: (!) 86%    Last BM Date : 11/05/22 General:   Elderly appearing patient, resting comfortably, not in acute distress Lungs: No visible respiratory distress Heart:  Regular rate and rhythm; no murmurs, clicks, rubs,  or gallops. Abdomen: Soft, nontender, nondistended, bowel sounds present, no peritoneal signs Rectal:  Deferred  GI:  Lab Results: Recent Labs    11/07/22 1032 11/08/22 0811 11/09/22 0707  WBC 10.0 16.8* 14.5*  HGB 11.0* 10.3* 6.9*  HCT 34.6* 32.5* 22.0*  PLT 564* 585* 424*   BMET Recent Labs    11/07/22 1032 11/08/22 0811 11/09/22 0707  NA 136 134* 137  K 3.6 3.9 3.5  CL 100 94* 101  CO2 25 24 25   GLUCOSE 261* 354* 261*  BUN 9 15 29*  CREATININE 0.85 1.00 1.18*  CALCIUM 9.7 9.7 8.3*   LFT Recent Labs    11/09/22 0707  PROT 5.1*  ALBUMIN 2.2*  AST 8*  ALT 14  ALKPHOS 59  BILITOT 0.4   PT/INR No results for input(s): "LABPROT", "INR" in the last 72 hours.   Studies/Results: CT ABDOMEN PELVIS W CONTRAST  Result Date: 11/08/2022 CLINICAL DATA:  One day history of vomiting associated with increased confusion EXAM: CT ABDOMEN AND PELVIS WITH CONTRAST TECHNIQUE: Multidetector CT imaging of the abdomen and pelvis was performed using the standard protocol following bolus administration of intravenous contrast. RADIATION DOSE REDUCTION: This exam was performed according to the departmental dose-optimization program which includes automated exposure control, adjustment of the mA and/or kV according to patient size and/or use of iterative reconstruction technique. CONTRAST:  80mL OMNIPAQUE IOHEXOL 300 MG/ML  SOLN COMPARISON:  CTA abdomen and pelvis dated 10/17/2022 FINDINGS: Lower chest:  Innumerable bilateral pulmonary nodules in keeping with known neuroendocrine tumor. No pleural effusion or  pneumothorax demonstrated. Partially imaged heart size is normal. Hepatobiliary: Hypoattenuation along the falciform ligament may reflect perfusional variation or focal steatosis. no intra or extrahepatic biliary ductal dilation. Cholecystectomy. Pancreas: No focal lesions or main ductal dilation. Spleen: New, wedge-shaped hypoattenuation of the spleen. Adrenals/Urinary Tract: No adrenal nodules. No suspicious renal mass or hydronephrosis. Punctate nonobstructing left renal stone. No focal bladder wall thickening. Stomach/Bowel: Moderate hiatal hernia. Normal appearance of the stomach. Persistent mural thickening of the duodenal with slightly decreased periduodenal stranding. Duodenal diverticulum. Normal appendix. Vascular/Lymphatic: Aortic atherosclerosis. No enlarged abdominal or pelvic lymph nodes. Reproductive: No adnexal masses. Other: No free fluid, fluid collection, or free air. Musculoskeletal: No acute or abnormal lytic or blastic osseous lesions. Multilevel degenerative changes of the partially imaged thoracic and lumbar spine. IMPRESSION: 1. New, wedge-shaped hypoattenuation of the spleen, suspicious for splenic infarct. 2. Persistent mural thickening of the duodenum with slightly decreased periduodenal stranding, likely improving duodenitis. 3. Innumerable bilateral pulmonary nodules in keeping with known neuroendocrine tumor. 4. Moderate hiatal hernia. 5. Aortic Atherosclerosis (ICD10-I70.0). Electronically Signed   By: Agustin Cree M.D.   On: 11/08/2022 10:36   DG Chest Portable 1 View  Result Date: 11/08/2022 CLINICAL DATA:  Cough and vomiting EXAM: PORTABLE CHEST 1 VIEW COMPARISON:  Chest radiograph dated 10/19/2022 FINDINGS: Normal lung volumes. Rounded retrocardiac opacity in keeping with known hiatal hernia. No new focal consolidations. No pleural effusion or pneumothorax. The heart size  and mediastinal contours are within normal limits. No acute osseous abnormality. IMPRESSION: 1. No acute cardiopulmonary process. 2. Rounded retrocardiac opacity in keeping with known hiatal hernia. Electronically Signed   By: Agustin Cree M.D.   On: 11/08/2022 09:28    Impression/Plan: -Melena in setting of full dose Lovenox for splenic infarct.  Patient with history of large hiatal hernia.  Occasional aspirin use but denies any NSAIDs.  Had endoscopy and colonoscopy in January 2023 by Dr. 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   -Nausea and vomiting -Acute blood loss anemia. 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  Recommendations --------------------- -Plan for EGD tomorrow.  Hold anticoagulation for now. -If EGD negative, she may need repeat colonoscopy given history of proximal colonic AVMs -Okay to have full liquid diet today.  Keep n.p.o. past midnight. -Monitor H&H.  Transfuse to keep hemoglobin more than 7. -Continue IV Protonix.   Risks (bleeding, infection, bowel perforation that could require surgery, sedation-related changes in cardiopulmonary systems), benefits (identification and possible treatment of source of symptoms, exclusion of certain causes of symptoms), and alternatives (watchful waiting, radiographic imaging studies, empiric medical treatment)  were explained to patient/family in detail and patient wishes to proceed.    LOS: 0 days   Kathi Der  MD, FACP 11/09/2022, 8:49 AM  Contact #  [phone]

## 2022-11-09 NOTE — H&P (View-Only) (Signed)
Referring Provider:  TH Primary Care Physician:  Timberlake, Kathryn S, MD Primary Gastroenterologist:  Dr. Schooler  Reason for Consultation:  Anemia, GI bleed   HPI: Michaela Santana is a 76 y.o. 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  Blood work this morning showed drop in hemoglobin to 6.9.  Normal LFTs.  Patient seen and examined at bedside.  Patient's daughter at bedside.  Patient lives with the daughter but according to daughter, she is not sure of any blood in the stool or black stool.  Patient usually goes to bathroom by herself and does not get assisted by the daughter.  Had 3 episodes of nonbloody vomiting.  Past Medical History:  Diagnosis Date   Allergic rhinitis    Anemia    Arthritis    "probably in hands" (07/17/2016)   CKD (chronic kidney disease), stage III (HCC)    Depression    Esophageal reflux    Family history of adverse reaction to anesthesia    "her father had some issues when he had his gallbladder taken out when he was in his late 80s"   GERD (gastroesophageal reflux disease)    Glaucoma    open angle, bilateral   High cholesterol    History of kidney stones    Hypercholesterolemia    Hypertension    Kidney cysts    Lumbago with sciatica, right side    Sepsis (HCC) 07/2016   d/t flu   Type II diabetes mellitus (HCC)    Vitamin D  deficiency     Past Surgical History:  Procedure Laterality Date   BREAST BIOPSY  1990   "? side; it wasn't anything"   BRONCHIAL BIOPSY  08/01/2021   Procedure: BRONCHIAL BIOPSIES;  Surgeon: Icard, Bradley L, DO;  Location: [facility];  Service: Pulmonary;;   BRONCHIAL NEEDLE ASPIRATION BIOPSY  08/01/2021   Procedure: BRONCHIAL NEEDLE ASPIRATION BIOPSIES;  Surgeon: Icard, Bradley L, DO;  Location: [facility];  Service: Pulmonary;;   CATARACT EXTRACTION, BILATERAL  2021   CHOLECYSTECTOMY OPEN  1971   COLONOSCOPY     2001,2011,2023   TUBAL LIGATION  1978   VIDEO BRONCHOSCOPY WITH RADIAL ENDOBRONCHIAL ULTRASOUND  08/01/2021   Procedure: RADIAL ENDOBRONCHIAL ULTRASOUND;  Surgeon: Icard, Bradley L, DO;  Location: [facility];  Service: Pulmonary;;    Prior to Admission medications   Medication Sig Start Date End Date Taking? Authorizing Provider  acetaminophen (TYLENOL) 650 MG CR tablet Take 650 mg by mouth every 8 (eight) hours as needed for pain.   Yes [provider]  albuterol (VENTOLIN HFA) 108 (90 Base) MCG/ACT inhaler Inhale into the lungs every 6 (six) hours as needed for wheezing or shortness of breath.   Yes [provider]  amLODipine (NORVASC) 5 MG tablet Take 5 mg by mouth daily. 01/20/20  Yes [provider]  Artificial Tear Solution (SYSTANE CONTACTS OP) Apply 0.4 drops to eye as needed.   Yes [provider]  calcium carbonate (TUMS - DOSED IN MG ELEMENTAL CALCIUM) 500 MG chewable tablet Chew 1 tablet by mouth daily.   Yes [provider]  DULoxetine (CYMBALTA) 30 MG capsule Take 30 mg by mouth daily.   Yes [provider]  famotidine (PEPCID) 20 MG tablet Take 20 mg by mouth daily.   Yes [provider]  ferrous sulfate 325 (65 FE) MG EC tablet Take 325 mg by mouth daily.   Yes [provider]  gabapentin (NEURONTIN) 300 MG capsule Take 300 mg by mouth every evening. 01/01/20  Yes [provider]  glipiZIDE (GLUCOTROL XL) 5 MG 24 hr tablet Take 5 mg by mouth daily with breakfast. 08/13/16  Yes [provider]  latanoprost (XALATAN) 0.005 % ophthalmic solution Place 1 drop into the right eye at bedtime. 09/21/22  Yes [provider]  loratadine (CLARITIN) 10 MG tablet Take 10 mg by mouth daily as needed for allergies.   Yes [provider]  LORazepam (ATIVAN) 0.5 MG tablet Take 0.5 mg by mouth every 8 (eight) hours.   Yes [provider]  losartan (COZAAR) 100 MG tablet Take 0.5 tablets (50 mg total) by mouth daily. 10/21/22  Yes Lama, Gagan S, MD  magnesium oxide (MAG-OX) 400 (240 Mg) MG tablet Take 800 mg by mouth 2 (two) times daily. 06/23/21  Yes [provider]  ondansetron (ZOFRAN) 8 MG tablet Take 8 mg by mouth every 8 (eight) hours as needed for nausea/vomiting. 09/14/21  Yes [provider]  oxymetazoline (AFRIN) 0.05 % nasal spray Place 1 spray into both nostrils 2 (two) times daily.   Yes [provider]  QUEtiapine (SEROQUEL) 25 MG tablet Take 12.5 mg by mouth every other day.   Yes [provider]  vitamin B-12 (CYANOCOBALAMIN) 1000 MCG tablet Take 1 tablet (1,000 mcg total) by mouth daily. 10/29/21  Yes Amin, Ankit Chirag, MD  Blood Glucose Monitoring Suppl (FREESTYLE LITE) DEVI USE UTD 07/26/16   [provider]  FREESTYLE LITE test strip USE TO CHECK FASTING GLUCOSE IN THE MORNING AND 2 HOURS AFTER DINNER OR LUNCH 07/26/16   [provider]  Lancets (FREESTYLE) lancets  07/26/16   [provider]    Scheduled Meds:  sodium chloride   Intravenous Once   cyanocobalamin  1,000 mcg Oral Daily   gabapentin  300 mg Oral QPM   insulin aspart  0-15 Units Subcutaneous TID WC   insulin aspart  0-5 Units Subcutaneous QHS   latanoprost  1 drop Right Eye QHS   LORazepam  0.5 mg Intravenous Q8H   pantoprazole (PROTONIX) IV  40 mg Intravenous Q12H   Continuous Infusions:   cefTRIAXone (ROCEPHIN)  IV 2 g (11/08/22 1543)   lactated ringers 75 mL/hr at 11/09/22 0721   metronidazole 500 mg (11/09/22 0411)   PRN Meds:.albuterol, hydrALAZINE, morphine injection, ondansetron (ZOFRAN) IV, oxyCODONE, phenol  Allergies as of 11/08/2022 - Review Complete 11/08/2022  Allergen Reaction Noted   Codeine Hypertension 07/17/2016   Alprazolam Other (See Comments) 02/15/2017    Family History  Problem Relation Age of Onset   Dementia Mother    Diabetes Mother    Stroke Father    Hypertension Father    CVA Father    Diabetes Sister    Hypertension Sister    Heart disease Paternal Grandfather    Breast cancer Neg Hx     Social History   Socioeconomic History   Marital status: Widowed      Spouse name: Not on file   Number of children: 2   Years of education: Not on file   Highest education level: Some college, no degree  Occupational History   Occupation: assistant  Tobacco Use   Smoking status: Never   Smokeless tobacco: Never  Vaping Use   Vaping Use: Never used  Substance and Sexual Activity   Alcohol use: Never   Drug use: Never   Sexual activity: Not on file  Other Topics Concern   Not on file  Social History Narrative   02/08/20 Lives alone   caffeine soda 1-2 daily   Social Determinants of Health   Financial Resource Strain: Not on file  Food Insecurity: No Food Insecurity (11/08/2022)   Hunger Vital Sign    Worried About Running Out of Food in the Last Year: Never true    Ran Out of Food in the Last Year: Never true  Transportation Needs: No Transportation Needs (11/08/2022)   PRAPARE - Transportation    Lack of Transportation (Medical): No    Lack of Transportation (Non-Medical): No  Physical Activity: Not on file  Stress: Not on file  Social Connections: Not on file  Intimate Partner Violence: Not At Risk (11/08/2022)   Humiliation, Afraid, Rape, and Kick questionnaire    Fear of Current or Ex-Partner: No    Emotionally Abused: No     Physically Abused: No    Sexually Abused: No    Review of Systems: All negative except as stated above in HPI.  Physical Exam: Vital signs: Vitals:   11/09/22 0515 11/09/22 0811  BP: 103/64 (!) 116/48  Pulse: 77   Resp: 15   Temp:  99.5 F (37.5 C)  SpO2: (!) 86%    Last BM Date : 11/05/22 General:   Elderly appearing patient, resting comfortably, not in acute distress Lungs: No visible respiratory distress Heart:  Regular rate and rhythm; no murmurs, clicks, rubs,  or gallops. Abdomen: Soft, nontender, nondistended, bowel sounds present, no peritoneal signs Rectal:  Deferred  GI:  Lab Results: Recent Labs    11/07/22 1032 11/08/22 0811 11/09/22 0707  WBC 10.0 16.8* 14.5*  HGB 11.0* 10.3* 6.9*  HCT 34.6* 32.5* 22.0*  PLT 564* 585* 424*   BMET Recent Labs    11/07/22 1032 11/08/22 0811 11/09/22 0707  NA 136 134* 137  K 3.6 3.9 3.5  CL 100 94* 101  CO2 25 24 25  GLUCOSE 261* 354* 261*  BUN 9 15 29*  CREATININE 0.85 1.00 1.18*  CALCIUM 9.7 9.7 8.3*   LFT Recent Labs    11/09/22 0707  PROT 5.1*  ALBUMIN 2.2*  AST 8*  ALT 14  ALKPHOS 59  BILITOT 0.4   PT/INR No results for input(s): "LABPROT", "INR" in the last 72 hours.   Studies/Results: CT ABDOMEN PELVIS W CONTRAST  Result Date: 11/08/2022 CLINICAL DATA:  One day history of vomiting associated with increased confusion EXAM: CT ABDOMEN AND PELVIS WITH CONTRAST TECHNIQUE: Multidetector CT imaging of the abdomen and pelvis was performed using the standard protocol following bolus administration of intravenous contrast. RADIATION DOSE REDUCTION: This exam was performed according to the departmental dose-optimization program which includes automated exposure control, adjustment of the mA and/or kV according to patient size and/or use of iterative reconstruction technique. CONTRAST:  80mL OMNIPAQUE IOHEXOL 300 MG/ML  SOLN COMPARISON:  CTA abdomen and pelvis dated 10/17/2022 FINDINGS: Lower chest:  Innumerable bilateral pulmonary nodules in keeping with known neuroendocrine tumor. No pleural effusion or   pneumothorax demonstrated. Partially imaged heart size is normal. Hepatobiliary: Hypoattenuation along the falciform ligament may reflect perfusional variation or focal steatosis. no intra or extrahepatic biliary ductal dilation. Cholecystectomy. Pancreas: No focal lesions or main ductal dilation. Spleen: New, wedge-shaped hypoattenuation of the spleen. Adrenals/Urinary Tract: No adrenal nodules. No suspicious renal mass or hydronephrosis. Punctate nonobstructing left renal stone. No focal bladder wall thickening. Stomach/Bowel: Moderate hiatal hernia. Normal appearance of the stomach. Persistent mural thickening of the duodenal with slightly decreased periduodenal stranding. Duodenal diverticulum. Normal appendix. Vascular/Lymphatic: Aortic atherosclerosis. No enlarged abdominal or pelvic lymph nodes. Reproductive: No adnexal masses. Other: No free fluid, fluid collection, or free air. Musculoskeletal: No acute or abnormal lytic or blastic osseous lesions. Multilevel degenerative changes of the partially imaged thoracic and lumbar spine. IMPRESSION: 1. New, wedge-shaped hypoattenuation of the spleen, suspicious for splenic infarct. 2. Persistent mural thickening of the duodenum with slightly decreased periduodenal stranding, likely improving duodenitis. 3. Innumerable bilateral pulmonary nodules in keeping with known neuroendocrine tumor. 4. Moderate hiatal hernia. 5. Aortic Atherosclerosis (ICD10-I70.0). Electronically Signed   By: Limin  Xu M.D.   On: 11/08/2022 10:36   DG Chest Portable 1 View  Result Date: 11/08/2022 CLINICAL DATA:  Cough and vomiting EXAM: PORTABLE CHEST 1 VIEW COMPARISON:  Chest radiograph dated 10/19/2022 FINDINGS: Normal lung volumes. Rounded retrocardiac opacity in keeping with known hiatal hernia. No new focal consolidations. No pleural effusion or pneumothorax. The heart size  and mediastinal contours are within normal limits. No acute osseous abnormality. IMPRESSION: 1. No acute cardiopulmonary process. 2. Rounded retrocardiac opacity in keeping with known hiatal hernia. Electronically Signed   By: Limin  Xu M.D.   On: 11/08/2022 09:28    Impression/Plan: -Melena in setting of full dose Lovenox for splenic infarct.  Patient with history of large hiatal hernia.  Occasional aspirin use but denies any NSAIDs.  Had endoscopy and colonoscopy in January 2023 by Dr. 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   -Nausea and vomiting -Acute blood loss anemia. 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  Recommendations --------------------- -Plan for EGD tomorrow.  Hold anticoagulation for now. -If EGD negative, she may need repeat colonoscopy given history of proximal colonic AVMs -Okay to have full liquid diet today.  Keep n.p.o. past midnight. -Monitor H&H.  Transfuse to keep hemoglobin more than 7. -Continue IV Protonix.   Risks (bleeding, infection, bowel perforation that could require surgery, sedation-related changes in cardiopulmonary systems), benefits (identification and possible treatment of source of symptoms, exclusion of certain causes of symptoms), and alternatives (watchful waiting, radiographic imaging studies, empiric medical treatment)  were explained to patient/family in detail and patient wishes to proceed.    LOS: 0 days   Aydin Hink  MD, FACP 11/09/2022, 8:49 AM  Contact #  [phone]  

## 2022-11-09 NOTE — Evaluation (Signed)
Physical Therapy Evaluation Patient Details Name: Michaela Santana MRN: [id number] DOB: 09-10-1945 Today's Date: 11/09/2022  History of Present Illness  The pt is a 77 yo female presenting 5/30 with AMS and vomiting. CT abdomen showed duodenitis and splenic infarct, ECG noted 2nd degree AV block. Admission complicated by melanotic stool. PMH includes: dementia, stage IV low-grade neuroendocrine carcinoma, DM II, HTN, and chronic back pain.   Clinical Impression  Pt in bed upon arrival of PT, agreeable to evaluation at this time. Prior to admission the pt was ambulating with use of SPC, living with her daughters providing 24/7 supervision, and with a single fall 6 weeks ago. The pt now presents with limitations in functional mobility, awareness, power, endurance, and stability due to above dx, and will continue to benefit from skilled PT to address these deficits. The pt was able to complete multiple sit-stand transfers and short distance ambulation in the room this session, but was limited by impulsivity  and poor safety awareness, requiring increased assist to steady and direct movements. The pt was more difficult to redirect than family reports is baseline. Will continue to benefit from skilled PT acutely to progress ambulation distance and independence with mobility as well as to reduce risk of further falls. Anticipate the pt will progress back towards being able to return home with family assist.        Recommendations for follow up therapy are one component of a multi-disciplinary discharge planning process, led by the attending physician.  Recommendations may be updated based on patient status, additional functional criteria and insurance authorization.  Follow Up Recommendations       Assistance Recommended at Discharge Frequent or constant Supervision/Assistance  Patient can return home with the following  A little help with walking and/or transfers;A little help with  bathing/dressing/bathroom;Assistance with cooking/housework;Direct supervision/assist for medications management;Direct supervision/assist for financial management;Assist for transportation;Help with stairs or ramp for entrance    Equipment Recommendations None recommended by PT  Recommendations for Other Services       Functional Status Assessment Patient has had a recent decline in their functional status and demonstrates the ability to make significant improvements in function in a reasonable and predictable amount of time.     Precautions / Restrictions Precautions Precautions: Fall Restrictions Weight Bearing Restrictions: No      Mobility  Bed Mobility Overal bed mobility: Needs Assistance Bed Mobility: Supine to Sit, Sit to Supine     Supine to sit: Supervision, HOB elevated Sit to supine: Min guard   General bed mobility comments: pt impulsively coming to sitting EOB, did use elevated HOB and rails    Transfers Overall transfer level: Needs assistance Equipment used: Rolling walker (2 wheels) Transfers: Sit to/from Stand, Bed to chair/wheelchair/BSC Sit to Stand: Mod assist, Min assist, +2 physical assistance   Step pivot transfers: Min assist, +2 physical assistance       General transfer comment: initially minA of 2 to rise, then modA of 2 to rise from low toilet. modA of 1-2 to rise from [facility].    Ambulation/Gait Ambulation/Gait assistance: Min assist, +2 physical assistance Gait Distance (Feet): 15 Feet Assistive device: 1 person hand held assist, 2 person hand held assist Gait Pattern/deviations: Step-through pattern, Staggering left, Staggering right, Trunk flexed, Shuffle Gait velocity: decreased Gait velocity interpretation: <1.31 ft/sec, indicative of household ambulator   General Gait Details: limited by fatigue, min-modA to steady with short distance ambulation to bathroom.    Balance Overall balance assessment: Needs assistance Sitting-balance  support: No upper extremity supported, Feet supported Sitting balance-Leahy Scale: Good     Standing balance support: Bilateral upper extremity supported, During functional activity Standing balance-Leahy Scale: Poor                               Pertinent Vitals/Pain Pain Assessment Pain Assessment: No/denies pain    Home Living Family/patient expects to be discharged to:: Private residence Living Arrangements: Children Available Help at Discharge: Family;Available 24 hours/day Type of Home: House Home Access: Level entry       Home Layout: One level Home Equipment: Rollator (4 wheels);Cane - single point;BSC/3in1;Shower seat - built in;Grab bars - toilet;Grab bars - tub/shower Additional Comments: pt's daughter reports pt active with PT for balance    Prior Function Prior Level of Function : Independent/Modified Independent             Mobility Comments: pt ambulating with intermittent use of cane in home, does take it for community mobility ADLs Comments: pt independent with ADLs, supervision from family for entering shower, is able to make herself a meal without assist     Hand Dominance   Dominant Hand: Right    Extremity/Trunk Assessment   Upper Extremity Assessment Upper Extremity Assessment: Defer to OT evaluation    Lower Extremity Assessment Lower Extremity Assessment: Generalized weakness (poor muscular power to rise to standing without assist and BUE support. no focal deficits)    Cervical / Trunk Assessment Cervical / Trunk Assessment: Other exceptions Cervical / Trunk Exceptions: large body habitus  Communication   Communication: No difficulties  Cognition Arousal/Alertness: Awake/alert Behavior During Therapy: Restless, Impulsive Overall Cognitive Status: Impaired/Different from baseline Area of Impairment: Orientation, Attention, Memory, Following commands, Safety/judgement, Awareness, Problem solving                  Orientation Level: Disoriented to, Place, Situation Current Attention Level: Focused Memory: Decreased recall of precautions, Decreased short-term memory Following Commands: Follows one step commands inconsistently, Follows one step commands with increased time Safety/Judgement: Decreased awareness of safety, Decreased awareness of deficits Awareness: Intellectual Problem Solving: Requires verbal cues General Comments: pt perseverating on going to the bathroom, unable to be directed to [facility] use. decreased insight to safety/deficits but not formally assessed. Pt with hx of dementia but daughter reports this is much less redirectable than baseline        General Comments General comments (skin integrity, edema, etc.): BP stable with changes in position. pt incontinent of bloody stool    Exercises     Assessment/Plan    PT Assessment Patient needs continued PT services  PT Problem List Decreased strength;Decreased activity tolerance;Decreased balance;Decreased mobility;Decreased knowledge of use of DME;Decreased cognition       PT Treatment Interventions DME instruction;Gait training;Stair training;Functional mobility training;Therapeutic activities;Therapeutic exercise;Balance training;Patient/family education    PT Goals (Current goals can be found in the Care Plan section)  Acute Rehab PT Goals Patient Stated Goal: to return home PT Goal Formulation: With patient Time For Goal Achievement: 11/23/22 Potential to Achieve Goals: Good    Frequency Min 3X/week     Co-evaluation PT/OT/SLP Co-Evaluation/Treatment: Yes Reason for Co-Treatment: Complexity of the patient's impairments (multi-system involvement);Necessary to address cognition/behavior during functional activity;For patient/therapist safety PT goals addressed during session: Mobility/safety with mobility;Balance;Strengthening/ROM         AM-PAC PT "6 Clicks" Mobility  Outcome Measure Help needed turning from your  back to your side while in  a flat bed without using bedrails?: A Little Help needed moving from lying on your back to sitting on the side of a flat bed without using bedrails?: A Little Help needed moving to and from a bed to a chair (including a wheelchair)?: A Little Help needed standing up from a chair using your arms (e.g., wheelchair or bedside chair)?: A Little Help needed to walk in hospital room?: A Little Help needed climbing 3-5 steps with a railing? : A Little 6 Click Score: 18    End of Session Equipment Utilized During Treatment: Gait belt Activity Tolerance: Patient limited by fatigue Patient left: with call bell/phone within reach;in bed;with bed alarm set Nurse Communication: Mobility status PT Visit Diagnosis: Other abnormalities of gait and mobility (R26.89);Muscle weakness (generalized) (M62.81)    Time: 1610-9604 PT Time Calculation (min) (ACUTE ONLY): 36 min   Charges:   PT Evaluation $PT Eval Moderate Complexity: 1 Mod          Vickki Muff, PT, DPT   Acute Rehabilitation Department Office [phone] Secure Chat Communication Preferred  Ronnie Derby 11/09/2022, 4:06 PM

## 2022-11-09 NOTE — Progress Notes (Addendum)
Called by nursing, patient with melanotic appearing stool.  Occult stool ordered, positive.  Serial H&H ordered, initial results pending.  Patient made n.p.o., Protonix every 12 hours ordered.  Lovenox held.  GI consulted Dr. Bosie Clos contacted, GI will see patient in AM.  Of note patient here with splenic infarct on therapeutic dose Lovenox.  Based on degree of hemoglobin decreased, could consider heparin drip with no bolus.   Patient previously admitted 10/17/2022 with diarrhea due to norovirus.  At that time she had dark stool that was occult positive.  However her hemoglobin was stable and no intervention was pursued.  Patient's last colonoscopy/endoscopy was January 2023 by Dr. Bosie Clos patient had a large hiatal hernia and a few proximal colonic AVMs.  She was seen by Dr. Dulce Sellar

## 2022-11-09 NOTE — Progress Notes (Signed)
Peripherally Inserted Central Catheter Placement  The IV Nurse has discussed with the patient and/or persons authorized to consent for the patient, the purpose of this procedure and the potential benefits and risks involved with this procedure.  The benefits include less needle sticks, lab draws from the catheter, and the patient may be discharged home with the catheter. Risks include, but not limited to, infection, bleeding, blood clot (thrombus formation), and puncture of an artery; nerve damage and irregular heartbeat and possibility to perform a PICC exchange if needed/ordered by physician.  Alternatives to this procedure were also discussed.  Bard Power PICC patient education guide, fact sheet on infection prevention and patient information card has been provided to patient /or left at bedside. Consent obtained with daughter at bedside    PICC Placement Documentation  PICC Double Lumen 11/09/22 Right Brachial 35 cm 0 cm (Active)  Indication for Insertion or Continuance of Line Limited venous access - need for IV therapy >5 days (PICC only) 11/09/22 1200  Exposed Catheter (cm) 0 cm 11/09/22 1200  Site Assessment Clean, Dry, Intact 11/09/22 1200  Lumen #1 Status Flushed;Saline locked;Blood return noted 11/09/22 1200  Lumen #2 Status Flushed;Saline locked;Blood return noted 11/09/22 1200  Dressing Type Transparent;Securing device 11/09/22 1200  Dressing Status Antimicrobial disc in place;Clean, Dry, Intact 11/09/22 1200  Safety Lock Not Applicable 11/09/22 1200  Line Care Connections checked and tightened 11/09/22 1200  Line Adjustment (NICU/IV Team Only) No 11/09/22 1200  Dressing Intervention New dressing 11/09/22 1200  Dressing Change Due 11/16/22 11/09/22 1200       Franne Grip Renee 11/09/2022, 12:01 PM

## 2022-11-09 NOTE — Progress Notes (Signed)
PT Cancellation Note  Patient Details Name: Michaela Santana MRN: [id number] DOB: 1945-07-03   Cancelled Treatment:    Reason Eval/Treat Not Completed: Medical issues which prohibited therapy. Pt with drop in Hgb from 10.3 to 6.9 this AM, RN preparing unit of blood and asked PT to return later. Will continue to follow and evaluate as appropriate.   Vickki Muff, PT, DPT   Acute Rehabilitation Department Office [phone] Secure Chat Communication Preferred    Ronnie Derby 11/09/2022, 8:46 AM

## 2022-11-09 NOTE — Inpatient Diabetes Management (Addendum)
Inpatient Diabetes Program Recommendations  AACE/ADA: New Consensus Statement on Inpatient Glycemic Control (2015)  Target Ranges:  Prepandial:   less than 140 mg/dL      Peak postprandial:   less than 180 mg/dL (1-2 hours)      Critically ill patients:  140 - 180 mg/dL   Lab Results  Component Value Date   GLUCAP 163 (H) 11/09/2022   HGBA1C 6.6 (H) 10/17/2022    Review of Glycemic Control  Latest Reference Range & Units 11/08/22 12:09 11/08/22 16:33 11/08/22 20:59 11/09/22 07:39 11/09/22 12:19  Glucose-Capillary 70 - 99 mg/dL 161 (H) 096 (H) 045 (H) 205 (H) 163 (H)   Diabetes history: DM 2 Outpatient Diabetes medications: Glipizide 5 mg Daily Current orders for Inpatient glycemic control:  Novolog 0-15 units tid + hs  NPO after midnight Creat 1.8 BUN 29  Inpatient Diabetes Program Recommendations:    -  watch on current regimen  If glucose trends remain elevated may consider low dose Semglee 4 units, if eating may could add low dose meal coverage if postprandials trends increase.   Thanks,  Christena Deem RN, MSN, BC-ADM Inpatient Diabetes Coordinator Team Pager [phone] (8a-5p)

## 2022-11-09 NOTE — Progress Notes (Signed)
Pt noted to have dark liquid stool with small red clots. Covering physician Joneen Roach, MD made aware. Positive Hemoccult test obtained from sample.

## 2022-11-09 NOTE — Evaluation (Signed)
Occupational Therapy Evaluation Patient Details Name: Michaela Santana MRN: [id number] DOB: 12-19-1945 Today's Date: 11/09/2022   History of Present Illness The pt is a 77 yo female presenting 5/30 with AMS and vomiting. CT abdomen showed duodenitis and splenic infarct, ECG noted 2nd degree AV block. Admission complicated by melanotic stool. PMH includes: dementia, stage IV low-grade neuroendocrine carcinoma, DM II, HTN, and chronic back pain.   Clinical Impression   Pt admitted with the above diagnosis. Pt currently with functional limitations due to the deficits listed below (see OT Problem List). Prior to admit, pt was living at home receiving assist from both of her daughters. Pt required assist more for cognition due to dementia and was able to physically complete BADL tasks. Pt will benefit from acute skilled OT to increase their safety and independence with ADL and functional mobility for ADL to facilitate discharge. Recommend HHOT services upon discharge to focus on mentioned deficits. OT will continue to follow patient acutely.        Recommendations for follow up therapy are one component of a multi-disciplinary discharge planning process, led by the attending physician.  Recommendations may be updated based on patient status, additional functional criteria and insurance authorization.   Assistance Recommended at Discharge Frequent or constant Supervision/Assistance  Patient can return home with the following A little help with walking and/or transfers;A little help with bathing/dressing/bathroom;Help with stairs or ramp for entrance;Assistance with cooking/housework;Assist for transportation    Functional Status Assessment  Patient has had a recent decline in their functional status and demonstrates the ability to make significant improvements in function in a reasonable and predictable amount of time.  Equipment Recommendations  None recommended by OT       Precautions /  Restrictions Precautions Precautions: Fall Restrictions Weight Bearing Restrictions: No      Mobility Bed Mobility Overal bed mobility: Needs Assistance Bed Mobility: Sit to Supine       Sit to supine: Min guard     Patient Response: Flat affect  Transfers Overall transfer level: Needs assistance Equipment used: None Transfers: Sit to/from Stand, Bed to chair/wheelchair/BSC Sit to Stand: Mod assist, Min assist, +2 physical assistance     Step pivot transfers: Min assist, +2 physical assistance     General transfer comment: initially minA of 2 to rise, then modA of 2 to rise from low toilet. modA of 1-2 to rise from [facility].      Balance Overall balance assessment: Needs assistance Sitting-balance support: No upper extremity supported, Feet supported Sitting balance-Leahy Scale: Good Sitting balance - Comments: sitting on BSC and also toilet   Standing balance support: Bilateral upper extremity supported, During functional activity Standing balance-Leahy Scale: Poor Standing balance comment: Reliant on external support for balance              ADL either performed or assessed with clinical judgement   ADL Overall ADL's : Needs assistance/impaired     Grooming: Wash/dry hands;Wash/dry face;Oral care;Minimal assistance;Sitting   Upper Body Bathing: Minimal assistance;Sitting   Lower Body Bathing: Total assistance;Sit to/from stand   Upper Body Dressing : Minimal assistance;Sitting   Lower Body Dressing: Total assistance;Sit to/from stand   Toilet Transfer: Minimal assistance;+2 for physical assistance;BSC/3in1;Stand-pivot;Cueing for safety   Toileting- Clothing Manipulation and Hygiene: Total assistance;Sit to/from stand        Vision Baseline Vision/History: 1 Wears glasses Ability to See in Adequate Light: 0 Adequate Patient Visual Report: No change from baseline  Pertinent Vitals/Pain Pain Assessment Pain Assessment: No/denies pain      Hand Dominance Right   Extremity/Trunk Assessment Upper Extremity Assessment Upper Extremity Assessment: Generalized weakness   Lower Extremity Assessment Lower Extremity Assessment: Generalized weakness   Cervical / Trunk Assessment Cervical / Trunk Assessment: Other exceptions Cervical / Trunk Exceptions: large body habitus   Communication Communication Communication: Expressive difficulties (due to fatigue and lethargy. pt required additional time to process or repeated questions)   Cognition Arousal/Alertness: Lethargic Behavior During Therapy: Restless, Impulsive Overall Cognitive Status: Impaired/Different from baseline Area of Impairment: Orientation, Attention, Memory, Following commands, Safety/judgement, Awareness, Problem solving      Orientation Level: Disoriented to, Place, Situation, Time   Memory: Decreased recall of precautions, Decreased short-term memory Following Commands: Follows one step commands inconsistently, Follows one step commands with increased time Safety/Judgement: Decreased awareness of safety, Decreased awareness of deficits   Problem Solving: Slow processing, Decreased initiation, Requires verbal cues, Requires tactile cues General Comments: pt perseverating on going to the bathroom, unable to be directed to [facility] use. decreased insight to safety/deficits but not formally assessed. Pt with hx of dementia but daughter reports this is much less redirectable than baseline     General Comments  BP stable with positional changes. Pt incontinent of bloody stool            Home Living Family/patient expects to be discharged to:: Private residence Living Arrangements: Children (two daughters take turns staying with patient.) Available Help at Discharge: Family;Available 24 hours/day (One daughter assists Sun-Fri and the other assists Fri-Sun) Type of Home: House Home Access: Level entry Entrance Stairs-Number of Steps: 1 threshold Entrance  Stairs-Rails: None Home Layout: One level     Bathroom Shower/Tub: Producer, television/film/video: Handicapped height Bathroom Accessibility: Yes   Home Equipment: Rollator (4 wheels);Cane - single point;BSC/3in1;Shower seat - built in;Grab bars - toilet;Grab bars - tub/shower   Additional Comments: pt's daughter reports pt active with PT for balance      Prior Functioning/Environment Prior Level of Function : Independent/Modified Independent    Mobility Comments: pt ambulating with intermittent use of cane in home, does take it for community mobility ADLs Comments: pt independent with ADLs, supervision from family for entering shower, is able to make herself a meal without assist        OT Problem List: Decreased strength;Decreased activity tolerance;Decreased safety awareness;Impaired balance (sitting and/or standing);Decreased knowledge of use of DME or AE      OT Treatment/Interventions: Self-care/ADL training;Therapeutic exercise;Therapeutic activities;Energy conservation;DME and/or AE instruction;Patient/family education;Balance training    OT Goals(Current goals can be found in the care plan section) Acute Rehab OT Goals Patient Stated Goal: to go to the bathroom OT Goal Formulation: Patient unable to participate in goal setting Time For Goal Achievement: 11/23/22 Potential to Achieve Goals: Good  OT Frequency: Min 2X/week    Co-evaluation PT/OT/SLP Co-Evaluation/Treatment: Yes Reason for Co-Treatment: Complexity of the patient's impairments (multi-system involvement);Necessary to address cognition/behavior during functional activity;For patient/therapist safety PT goals addressed during session: Mobility/safety with mobility;Balance;Strengthening/ROM OT goals addressed during session: ADL's and self-care;Strengthening/ROM      AM-PAC OT "6 Clicks" Daily Activity     Outcome Measure Help from another person eating meals?: A Little Help from another person taking  care of personal grooming?: A Little Help from another person toileting, which includes using toliet, bedpan, or urinal?: Total Help from another person bathing (including washing, rinsing, drying)?: Total Help from another person to put on and taking  off regular upper body clothing?: A Little Help from another person to put on and taking off regular lower body clothing?: Total 6 Click Score: 12   End of Session Nurse Communication: Mobility status  Activity Tolerance: Patient limited by fatigue Patient left: in bed;with call bell/phone within reach;with bed alarm set;with family/visitor present  OT Visit Diagnosis: Unsteadiness on feet (R26.81);Muscle weakness (generalized) (M62.81);History of falling (Z91.81)                Time: 1533-1550 OT Time Calculation (min): 17 min Charges:  OT General Charges $OT Visit: 1 Visit OT Evaluation $OT Eval Moderate Complexity: 1 Mod  [employer], OTR/L,CBIS  Supplemental OT - [facility] Secure Chat Preferred    Agustin Swatek, Charisse March 11/09/2022, 4:21 PM

## 2022-11-09 NOTE — Progress Notes (Signed)
PROGRESS NOTE    [name]  ZOX:[id number] DOB: 03/01/1946 DOA: 11/08/2022 PCP: Shon Hale, MD    Chief Complaint  Patient presents with   Emesis    Brief Narrative:    Michaela Santana  is a 77 y.o. female,  with medical history significant for stage IV low-grade neuroendocrine carcinoma, type 2 diabetes mellitus, hypertension, and chronic back pain, patient with recent hospitalization 5/7 until 5/12 this month for diarrhea, related to norovirus. -Patient presents to ED today secondary to complaints of nausea, vomiting and abdominal pain, she reports symptoms started Tuesday, she reports vomiting, she denies coffee-ground emesis, hematochezia, melena, diarrhea or constipation, no fever, no chills, she does report abdominal pain. -In ED CT abdomen pelvis significant for duodenitis, but as well noted for splenic infarct, significant for leukocytosis, thrombocytosis, and hyperglycemia, as well her EKG showing heart block Mobitz 1, patient started on anticoagulation with Lovenox given splenic infarct, she developed melena overnight, please see discussion below.  Assessment & Plan:   Principal Problem:   Acute metabolic encephalopathy Active Problems:   Congestive heart failure (HCC)   Intractable nausea and vomiting   Hypertension   Renal mass      Intractable nausea and vomiting secondary to duodenitis -Patient presents with nausea, vomiting, abdominal pain, CT abdomen pelvis significant for duodenitis -Clear liquid diet, advance as tolerated. -Continue with IV Rocephin and Flagyl   Splenic infarct -Has elevated and on imaging, this is most likely in the setting of hypercoagulable state in the setting of known neuroendocrine malignancy, and possible renal cell carcinoma -Did on Lovenox for anticoagulation, developed GI bleed, currently on hold  Acute blood loss anemia/GI bleed -She developed melena overnight, with drop in her hemoglobin, plan for EGD in a.m., and  possible colonoscopy -GI input greatly appreciated. -Monitor CBC closely and transfuse as needed   Type 2 diabetes mellitus -Will keep an insulin sliding scale during hospital stay, will hold oral agents   Carcinoid tumor -Stage IV, low-grade neuroendocrine carcinoma, followed by Dr. Shirline Frees, will with innumerable pulmonary nodules, current plan for observation   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   Leukocytosis -  due to above, continue to trend, monitor with IV fluids   Thrombocytosis -Due to infection, continue with IV fluids   Pretension -Continue with Norvasc, hold losartan   Second-degree AV block, Mobitz 2 -This appears to be progressive from her baseline Mobitz 1, well consult cardiology   Dehydration -continue with IV fluid     DVT prophylaxis: lovenox on hold Code Status: Full Family Communication: Daughter at bedside Disposition:   Status is: Inpatient    Consultants:  Cardiology GI  Subjective:  She had melena overnight Objective: Vitals:   11/09/22 0811 11/09/22 1216 11/09/22 1259 11/09/22 1300  BP: (!) 116/48 (!) 131/54 (!) 104/51 (!) 104/51  Pulse:  74  66  Resp:  18  16  Temp: 99.5 F (37.5 C) 98.7 F (37.1 C) (!) 97.4 F (36.3 C) (!) 97.4 F (36.3 C)  TempSrc: Oral Oral Axillary Axillary  SpO2:  90%  90%  Weight:      Height:       No intake or output data in the 24 hours ending 11/09/22 1412 Filed Weights   11/08/22 0753  Weight: 81.2 kg    Examination:  Awake Alert, Oriented X 3, No new F.N deficits, Normal affect Symmetrical Chest wall movement, Good air movement bilaterally, CTAB RRR,No Gallops,Rubs or new Murmurs, No Parasternal Heave +ve B.Sounds,  has epigastric tenderness, No rebound - guarding or rigidity. No Cyanosis, Clubbing or edema, No new Rash or bruise       Data Reviewed: I have personally reviewed following labs and imaging studies  CBC: Recent Labs  Lab 11/07/22 1032 11/08/22 0811  11/09/22 0707 11/09/22 1043  WBC 10.0 16.8* 14.5*  --   NEUTROABS 8.1*  --   --   --   HGB 11.0* 10.3* 6.9* 7.3*  HCT 34.6* 32.5* 22.0* 24.0*  MCV 95.8 94.2 98.2  --   PLT 564* 585* 424*  --     Basic Metabolic Panel: Recent Labs  Lab 11/07/22 1032 11/08/22 0811 11/09/22 0707  NA 136 134* 137  K 3.6 3.9 3.5  CL 100 94* 101  CO2 25 24 25   GLUCOSE 261* 354* 261*  BUN 9 15 29*  CREATININE 0.85 1.00 1.18*  CALCIUM 9.7 9.7 8.3*  MG  --  1.7  --     GFR: Estimated Creatinine Clearance: 39.8 mL/min (A) (by C-G formula based on SCr of 1.18 mg/dL (H)).  Liver Function Tests: Recent Labs  Lab 11/07/22 1032 11/08/22 0811 11/09/22 0707  AST 12* 15 8*  ALT 13 12 14   ALKPHOS 101 84 59  BILITOT 0.5 0.6 0.4  PROT 7.8 7.4 5.1*  ALBUMIN 3.8 3.7 2.2*    CBG: Recent Labs  Lab 11/08/22 1209 11/08/22 1633 11/08/22 2059 11/09/22 0739 11/09/22 1219  GLUCAP 246* 253* 153* 205* 163*     No results found for this or any previous visit (from the past 240 hour(s)).       Radiology Studies: [place] EKG SITE RITE  Result Date: 11/09/2022 If Site Rite image not attached, placement could not be confirmed due to current cardiac rhythm.  CT ABDOMEN PELVIS W CONTRAST  Result Date: 11/08/2022 CLINICAL DATA:  One day history of vomiting associated with increased confusion EXAM: CT ABDOMEN AND PELVIS WITH CONTRAST TECHNIQUE: Multidetector CT imaging of the abdomen and pelvis was performed using the standard protocol following bolus administration of intravenous contrast. RADIATION DOSE REDUCTION: This exam was performed according to the departmental dose-optimization program which includes automated exposure control, adjustment of the mA and/or kV according to patient size and/or use of iterative reconstruction technique. CONTRAST:  80mL OMNIPAQUE IOHEXOL 300 MG/ML  SOLN COMPARISON:  CTA abdomen and pelvis dated 10/17/2022 FINDINGS: Lower chest: Innumerable bilateral pulmonary nodules in  keeping with known neuroendocrine tumor. No pleural effusion or pneumothorax demonstrated. Partially imaged heart size is normal. Hepatobiliary: Hypoattenuation along the falciform ligament may reflect perfusional variation or focal steatosis. no intra or extrahepatic biliary ductal dilation. Cholecystectomy. Pancreas: No focal lesions or main ductal dilation. Spleen: New, wedge-shaped hypoattenuation of the spleen. Adrenals/Urinary Tract: No adrenal nodules. No suspicious renal mass or hydronephrosis. Punctate nonobstructing left renal stone. No focal bladder wall thickening. Stomach/Bowel: Moderate hiatal hernia. Normal appearance of the stomach. Persistent mural thickening of the duodenal with slightly decreased periduodenal stranding. Duodenal diverticulum. Normal appendix. Vascular/Lymphatic: Aortic atherosclerosis. No enlarged abdominal or pelvic lymph nodes. Reproductive: No adnexal masses. Other: No free fluid, fluid collection, or free air. Musculoskeletal: No acute or abnormal lytic or blastic osseous lesions. Multilevel degenerative changes of the partially imaged thoracic and lumbar spine. IMPRESSION: 1. New, wedge-shaped hypoattenuation of the spleen, suspicious for splenic infarct. 2. Persistent mural thickening of the duodenum with slightly decreased periduodenal stranding, likely improving duodenitis. 3. Innumerable bilateral pulmonary nodules in keeping with known neuroendocrine tumor. 4. Moderate hiatal hernia. 5. Aortic Atherosclerosis (  ICD10-I70.0). Electronically Signed   By: Agustin Cree M.D.   On: 11/08/2022 10:36   DG Chest Portable 1 View  Result Date: 11/08/2022 CLINICAL DATA:  Cough and vomiting EXAM: PORTABLE CHEST 1 VIEW COMPARISON:  Chest radiograph dated 10/19/2022 FINDINGS: Normal lung volumes. Rounded retrocardiac opacity in keeping with known hiatal hernia. No new focal consolidations. No pleural effusion or pneumothorax. The heart size and mediastinal contours are within normal  limits. No acute osseous abnormality. IMPRESSION: 1. No acute cardiopulmonary process. 2. Rounded retrocardiac opacity in keeping with known hiatal hernia. Electronically Signed   By: Agustin Cree M.D.   On: 11/08/2022 09:28        Scheduled Meds:  Chlorhexidine Gluconate Cloth  6 each Topical Daily   cyanocobalamin  1,000 mcg Oral Daily   gabapentin  300 mg Oral QPM   insulin aspart  0-15 Units Subcutaneous TID WC   insulin aspart  0-5 Units Subcutaneous QHS   latanoprost  1 drop Right Eye QHS   LORazepam  0.5 mg Intravenous Q8H   pantoprazole (PROTONIX) IV  40 mg Intravenous Q12H   sodium chloride flush  10-40 mL Intracatheter Q12H   Continuous Infusions:  cefTRIAXone (ROCEPHIN)  IV 2 g (11/08/22 1543)   lactated ringers 75 mL/hr at 11/09/22 0721   metronidazole 500 mg (11/09/22 0411)     LOS: 0 days       Huey Bienenstock, MD [facility]   To contact the attending provider between 7A-7P or the covering provider during after hours 7P-7A, please log into the web site [email or web address] and access using universal [place] password for that web site. If you do not have the password, please call the hospital operator.  11/09/2022, 2:12 PM

## 2022-11-09 NOTE — Progress Notes (Signed)
OT Cancellation Note  Patient Details Name: SHARANYA ORIANS MRN: [id number] DOB: 12/01/1945   Cancelled Treatment:    Reason Eval/Treat Not Completed: Medical issues which prohibited therapy (Hemaglobin dropped from 10.3 to 6.9. Pt currently waiting for blood transfusion. Will follow up with pt this afternoon to assess ability to participate in OT eval.)  Limmie Patricia, OTR/L,CBIS  Supplemental OT - [facility] Secure Chat Preferred   11/09/2022, 8:22 AM

## 2022-11-10 ENCOUNTER — Encounter (HOSPITAL_COMMUNITY): Payer: Self-pay | Admitting: Internal Medicine

## 2022-11-10 ENCOUNTER — Inpatient Hospital Stay (HOSPITAL_COMMUNITY): Payer: Medicare Other | Admitting: Anesthesiology

## 2022-11-10 ENCOUNTER — Encounter (HOSPITAL_COMMUNITY)
Admission: EM | Disposition: A | Discharge status: Home / Self Care | Payer: Self-pay | Source: Home / Self Care | Attending: Internal Medicine

## 2022-11-10 DIAGNOSIS — K269 Duodenal ulcer, unspecified as acute or chronic, without hemorrhage or perforation: Secondary | ICD-10-CM | POA: Diagnosis not present

## 2022-11-10 DIAGNOSIS — I110 Hypertensive heart disease with heart failure: Secondary | ICD-10-CM

## 2022-11-10 DIAGNOSIS — I509 Heart failure, unspecified: Secondary | ICD-10-CM

## 2022-11-10 DIAGNOSIS — D62 Acute posthemorrhagic anemia: Secondary | ICD-10-CM

## 2022-11-10 DIAGNOSIS — K260 Acute duodenal ulcer with hemorrhage: Secondary | ICD-10-CM | POA: Diagnosis not present

## 2022-11-10 DIAGNOSIS — K449 Diaphragmatic hernia without obstruction or gangrene: Secondary | ICD-10-CM

## 2022-11-10 DIAGNOSIS — G9341 Metabolic encephalopathy: Secondary | ICD-10-CM | POA: Diagnosis not present

## 2022-11-10 HISTORY — PX: ESOPHAGOGASTRODUODENOSCOPY (EGD) WITH PROPOFOL: SHX5813

## 2022-11-10 LAB — URINALYSIS, ROUTINE W REFLEX MICROSCOPIC
Bilirubin Urine: NEGATIVE
Glucose, UA: 50 mg/dL — AB
Ketones, ur: 20 mg/dL — AB
Nitrite: NEGATIVE
Protein, ur: 30 mg/dL — AB
Specific Gravity, Urine: 1.02 (ref 1.005–1.030)
pH: 6 (ref 5.0–8.0)

## 2022-11-10 LAB — BPAM RBC
Blood Product Expiration Date: 202406222359
Blood Product Expiration Date: 202406262359
ISSUE DATE / TIME: 202405312024

## 2022-11-10 LAB — CBC
HCT: 27.2 % — ABNORMAL LOW (ref 36.0–46.0)
Hemoglobin: 8.4 g/dL — ABNORMAL LOW (ref 12.0–15.0)
MCH: 29.5 pg (ref 26.0–34.0)
MCHC: 30.9 g/dL (ref 30.0–36.0)
MCV: 95.4 fL (ref 80.0–100.0)
Platelets: 301 10*3/uL (ref 150–400)
RBC: 2.85 MIL/uL — ABNORMAL LOW (ref 3.87–5.11)
RDW: 15 % (ref 11.5–15.5)
WBC: 8.2 10*3/uL (ref 4.0–10.5)
nRBC: 0 % (ref 0.0–0.2)

## 2022-11-10 LAB — HEMOGLOBIN AND HEMATOCRIT, BLOOD
HCT: 28.5 % — ABNORMAL LOW (ref 36.0–46.0)
Hemoglobin: 9 g/dL — ABNORMAL LOW (ref 12.0–15.0)

## 2022-11-10 LAB — BASIC METABOLIC PANEL
Anion gap: 9 (ref 5–15)
BUN: 21 mg/dL (ref 8–23)
CO2: 25 mmol/L (ref 22–32)
Calcium: 8.1 mg/dL — ABNORMAL LOW (ref 8.9–10.3)
Chloride: 106 mmol/L (ref 98–111)
Creatinine, Ser: 1.01 mg/dL — ABNORMAL HIGH (ref 0.44–1.00)
GFR, Estimated: 58 mL/min — ABNORMAL LOW (ref >60)
Glucose, Bld: 195 mg/dL — ABNORMAL HIGH (ref 70–99)
Potassium: 2.9 mmol/L — ABNORMAL LOW (ref 3.5–5.1)
Sodium: 140 mmol/L (ref 135–145)

## 2022-11-10 LAB — TYPE AND SCREEN
Unit division: 0
Unit division: 0

## 2022-11-10 LAB — GLUCOSE, CAPILLARY
Glucose-Capillary: 193 mg/dL — ABNORMAL HIGH (ref 70–99)
Glucose-Capillary: 178 mg/dL — ABNORMAL HIGH (ref 70–99)
Glucose-Capillary: 154 mg/dL — ABNORMAL HIGH (ref 70–99)
Glucose-Capillary: 165 mg/dL — ABNORMAL HIGH (ref 70–99)

## 2022-11-10 LAB — HEMOGLOBIN A1C
Hgb A1c MFr Bld: 7.8 % — ABNORMAL HIGH (ref 4.8–5.6)
Mean Plasma Glucose: 177 mg/dL

## 2022-11-10 LAB — MAGNESIUM: Magnesium: 1.7 mg/dL (ref 1.7–2.4)

## 2022-11-10 LAB — PHOSPHORUS: Phosphorus: 2.9 mg/dL (ref 2.5–4.6)

## 2022-11-10 SURGERY — ESOPHAGOGASTRODUODENOSCOPY (EGD) WITH PROPOFOL
Anesthesia: Monitor Anesthesia Care

## 2022-11-10 MED ORDER — QUETIAPINE FUMARATE 25 MG PO TABS
25.0000 mg | ORAL_TABLET | Freq: Every day | ORAL | Status: DC
  Administered 2022-11-10: 25 mg via ORAL
  Filled 2022-11-10: qty 1

## 2022-11-10 MED ORDER — PROCHLORPERAZINE EDISYLATE 10 MG/2ML IJ SOLN
5.0000 mg | INTRAMUSCULAR | Status: DC | PRN
  Administered 2022-11-10: 5 mg via INTRAVENOUS
  Filled 2022-11-10: qty 2

## 2022-11-10 MED ORDER — PROPOFOL 500 MG/50ML IV EMUL
INTRAVENOUS | Status: DC | PRN
  Administered 2022-11-10: 225 ug/kg/min via INTRAVENOUS
  Administered 2022-11-10: 150 ug/kg/min via INTRAVENOUS

## 2022-11-10 MED ORDER — PROPOFOL 10 MG/ML IV BOLUS
INTRAVENOUS | Status: DC | PRN
  Administered 2022-11-10: 50 mg via INTRAVENOUS

## 2022-11-10 MED ORDER — POTASSIUM CHLORIDE 10 MEQ/100ML IV SOLN
10.0000 meq | INTRAVENOUS | Status: DC
  Administered 2022-11-10 (×6): 10 meq via INTRAVENOUS
  Filled 2022-11-10 (×6): qty 100

## 2022-11-10 MED ORDER — PANTOPRAZOLE INFUSION (NEW) - SIMPLE MED
8.0000 mg/h | INTRAVENOUS | Status: DC
  Administered 2022-11-10 – 2022-11-12 (×5): 8 mg/h via INTRAVENOUS
  Filled 2022-11-10 (×8): qty 100

## 2022-11-10 MED ORDER — SODIUM CHLORIDE 0.9 % IV SOLN: INTRAVENOUS | Status: DC

## 2022-11-10 MED ORDER — POTASSIUM CHLORIDE CRYS ER 20 MEQ PO TBCR
40.0000 meq | EXTENDED_RELEASE_TABLET | Freq: Once | ORAL | Status: AC
  Administered 2022-11-10: 40 meq via ORAL
  Filled 2022-11-10: qty 2

## 2022-11-10 MED ORDER — LOPERAMIDE HCL 2 MG PO CAPS: 2.0000 mg | ORAL_CAPSULE | Freq: Four times a day (QID) | ORAL | Status: DC | PRN

## 2022-11-10 MED ORDER — LORAZEPAM 2 MG/ML IJ SOLN
0.5000 mg | Freq: Once | INTRAMUSCULAR | Status: AC
  Administered 2022-11-10: 0.5 mg via INTRAVENOUS
  Filled 2022-11-10: qty 1

## 2022-11-10 SURGICAL SUPPLY — 15 items

## 2022-11-10 NOTE — Op Note (Signed)
[facility] Patient Name: Michaela Santana Procedure Date : 11/10/2022 MRN: [id number] Attending MD: Willis Modena , MD, [id number] Date of Birth: Dec 03, 1945 CSN: [id number] Age: 77 Admit Type: Inpatient Procedure:                Upper GI endoscopy Indications:              Acute post hemorrhagic anemia, Melena Providers:                Willis Modena, MD, Adolph Pollack, RN, Brion Aliment, Technician Referring MD:             [facility] Medicines:                Monitored Anesthesia Care Complications:            No immediate complications. Estimated Blood Loss:     Estimated blood loss: none. Procedure:                Pre-Anesthesia Assessment:                           - Prior to the procedure, a History and Physical                            was performed, and patient medications and                            allergies were reviewed. The patient's tolerance of                            previous anesthesia was also reviewed. The risks                            and benefits of the procedure and the sedation                            options and risks were discussed with the patient.                            All questions were answered, and informed consent                            was obtained. Prior Anticoagulants: The patient has                            taken Lovenox (enoxaparin), last dose was 1 day                            prior to procedure. ASA Grade Assessment: III - A                            patient with severe systemic disease. After  reviewing the risks and benefits, the patient was                            deemed in satisfactory condition to undergo the                            procedure.                           After obtaining informed consent, the endoscope was                            passed under direct vision. Throughout the                            procedure, the  patient's blood pressure, pulse, and                            oxygen saturations were monitored continuously. The                            [id number]-[id number] ([id number]) Olympus endoscope was introduced                            through the mouth, and advanced to the second part                            of duodenum. The upper GI endoscopy was                            accomplished without difficulty. The patient                            tolerated the procedure well. Scope In: Scope Out: Findings:      The examined esophagus was normal.      A medium-sized hiatal hernia was present.      The exam of the stomach was otherwise normal.      One large and deep non-bleeding cratered duodenal ulcer with no stigmata       of bleeding was found in the duodenal bulb. The lesion was at least 30 x       20 mm in largest dimension.      The exam of the duodenum was otherwise normal. Impression:               - Normal esophagus.                           - Medium-sized hiatal hernia.                           - Non-bleeding duodenal ulcer with no stigmata of                            bleeding. Source of prior bleeding. No current  stigmata.                           - No specimens collected. Recommendation:           - Return patient to hospital ward for ongoing care.                           - Clear liquid diet today.                           - Continue present medications.                           - Follow CBC; transfuse as needed.                           - Change PPI to pantoprazole drip.                           - If rebleeding, would consult Interventional                            Radiology for consideration of embolization of                            gastroduodenal artery; ulcer too big for endoscopic                            treatment.                           - High risk for significant rebleeding on any type                            of  anticoagulation.                           - Avoid NSAIDs/anticoagulants as clinically                            feasible.                           - Check H. pylori fecal antigen.                           [name] will follow. Procedure Code(s):        --- Professional ---                           [phone], Esophagogastroduodenoscopy, flexible,                            transoral; diagnostic, including collection of                            specimen(s) by brushing or washing, when performed                            (  separate procedure) Diagnosis Code(s):        --- Professional ---                           K44.9, Diaphragmatic hernia without obstruction or                            gangrene                           K26.9, Duodenal ulcer, unspecified as acute or                            chronic, without hemorrhage or perforation                           D62, Acute posthemorrhagic anemia                           K92.1, Melena (includes Hematochezia) CPT copyright 2022 [employer]. All rights reserved. The codes documented in this report are preliminary and upon coder review may  be revised to meet current compliance requirements. Willis Modena, MD 11/10/2022 11:35:15 AM This report has been signed electronically. Number of Addenda: 0

## 2022-11-10 NOTE — Anesthesia Postprocedure Evaluation (Signed)
Anesthesia Post Note  Patient: Michaela Santana  Procedure(s) Performed: ESOPHAGOGASTRODUODENOSCOPY (EGD) WITH PROPOFOL     Patient location during evaluation: Endoscopy Anesthesia Type: MAC Level of consciousness: patient cooperative and awake Pain management: pain level controlled Vital Signs Assessment: post-procedure vital signs reviewed and stable Respiratory status: spontaneous breathing, nonlabored ventilation and respiratory function stable Cardiovascular status: stable and blood pressure returned to baseline Postop Assessment: no apparent nausea or vomiting Anesthetic complications: no   No notable events documented.  Last Vitals:  Vitals:   11/10/22 1130 11/10/22 1140  BP: (!) 134/49 (!) 154/55  Pulse: 60 68  Resp: 19 19  Temp:    SpO2: 100% 98%    Last Pain:  Vitals:   11/10/22 1140  TempSrc:   PainSc: 0-No pain                 Dave Mannes

## 2022-11-10 NOTE — Anesthesia Preprocedure Evaluation (Signed)
Anesthesia Evaluation  Patient identified by MRN, date of birth, ID band Patient confused    Reviewed: Allergy & Precautions, NPO status , Patient's Chart, lab work & pertinent test results  History of Anesthesia Complications Negative for: history of anesthetic complications  Airway Mallampati: III  TM Distance: >3 FB Neck ROM: Full    Dental  (+) Dental Advisory Given   Pulmonary    breath sounds clear to auscultation       Cardiovascular hypertension, Pt. on medications (-) angina +CHF  (-) CAD and (-) Past MI + dysrhythmias Atrial Fibrillation  Rhythm:Regular  1. Left ventricular ejection fraction, by estimation, is 65 to 70%. The  left ventricle has normal function. The left ventricle has no regional  wall motion abnormalities. Left ventricular diastolic parameters are  consistent with Grade I diastolic  dysfunction (impaired relaxation).   2. Right ventricular systolic function is normal. The right ventricular  size is normal. There is normal pulmonary artery systolic pressure. The  estimated right ventricular systolic pressure is 30.9 mmHg.   3. The mitral valve is grossly normal. No evidence of mitral valve  regurgitation.   4. The aortic valve is tricuspid. Aortic valve regurgitation is not  visualized.   5. The inferior vena cava is normal in size with greater than 50%  respiratory variability, suggesting right atrial pressure of 3 mmHg.     Neuro/Psych  PSYCHIATRIC DISORDERS  Depression     Neuromuscular disease    GI/Hepatic Neg liver ROS,GERD  ,,? Gi bleed   Endo/Other  diabetes, Type 2    Renal/GU Renal InsufficiencyRenal diseaseLab Results      Component                Value               Date                      CREATININE               1.01 (H)            11/10/2022                Musculoskeletal  (+) Arthritis ,    Abdominal   Peds  Hematology  (+) Blood dyscrasia, anemia Lab Results       Component                Value               Date                      WBC                      8.2                 11/10/2022                HGB                      8.4 (L)             11/10/2022                HCT                      27.2 (L)  11/10/2022                MCV                      95.4                11/10/2022                PLT                      301                 11/10/2022              Anesthesia Other Findings   Reproductive/Obstetrics                              Anesthesia Physical Anesthesia Plan  ASA: 3  Anesthesia Plan: MAC   Post-op Pain Management: Minimal or no pain anticipated   Induction: Intravenous  PONV Risk Score and Plan: 2 and Propofol infusion and Treatment may vary due to age or medical condition  Airway Management Planned: Nasal Cannula and Natural Airway  Additional Equipment: None  Intra-op Plan:   Post-operative Plan:   Informed Consent:      Consent reviewed with POA and Dental advisory given  Plan Discussed with: CRNA  Anesthesia Plan Comments:          Anesthesia Quick Evaluation

## 2022-11-10 NOTE — Transfer of Care (Signed)
Immediate Anesthesia Transfer of Care Note  Patient: Michaela Santana  Procedure(s) Performed: ESOPHAGOGASTRODUODENOSCOPY (EGD) WITH PROPOFOL MAC Patient Location: PACU  Anesthesia Type:MAC  Level of Consciousness: drowsy  Airway & Oxygen Therapy: Patient Spontanous Breathing  Post-op Assessment: Report given to RN and Post -op Vital signs reviewed and stable  Post vital signs: Reviewed and stable  Last Vitals:  Vitals Value Taken Time  BP 161/65 11/10/22 1152  Temp 36.4 C 11/10/22 1113  Pulse 90 11/10/22 1204  Resp 28 11/10/22 1204  SpO2 99 % 11/10/22 1204  Vitals shown include unvalidated device data.  Last Pain:  Vitals:   11/10/22 1140  TempSrc:   PainSc: 0-No pain         Complications: No notable events documented.

## 2022-11-10 NOTE — Interval H&P Note (Signed)
History and Physical Interval Note:  11/10/2022 10:51 AM  Michaela Santana  has presented today for surgery, with the diagnosis of Melena, Anemia.  The various methods of treatment have been discussed with the patient and family. After consideration of risks, benefits and other options for treatment, the patient has consented to  Procedure(s): ESOPHAGOGASTRODUODENOSCOPY (EGD) WITH PROPOFOL (N/A) as a surgical intervention.  The patient's history has been reviewed, patient examined, no change in status, stable for surgery.  I have reviewed the patient's chart and labs.  Questions were answered to the patient's satisfaction.     Freddy Jaksch

## 2022-11-10 NOTE — Progress Notes (Addendum)
PROGRESS NOTE    Michaela Santana  ZOX:[id number] DOB: 1946/05/25 DOA: 11/08/2022 PCP: Shon Hale, MD    Chief Complaint  Patient presents with   Emesis    Brief Narrative:    Michaela Santana  is a 77 y.o. female,  with medical history significant for stage IV low-grade neuroendocrine carcinoma, type 2 diabetes mellitus, hypertension, and chronic back pain, patient with recent hospitalization 5/7 until 5/12 this month for diarrhea, related to norovirus. -Patient presents to ED today secondary to complaints of nausea, vomiting and abdominal pain, she reports symptoms started Tuesday, she reports vomiting, she denies coffee-ground emesis, hematochezia, melena, diarrhea or constipation, no fever, no chills, she does report abdominal pain. -In ED CT abdomen pelvis significant for duodenitis, but as well noted for splenic infarct, significant for leukocytosis, thrombocytosis, and hyperglycemia, as well her EKG showing heart block Mobitz 1, patient started on anticoagulation with Lovenox given splenic infarct, she developed melena overnight, please see discussion below.  Assessment & Plan:   Principal Problem:   Acute metabolic encephalopathy Active Problems:   Congestive heart failure (HCC)   Intractable nausea and vomiting   Hypertension   Renal mass      Intractable nausea and vomiting secondary to duodenitis -Patient presents with nausea, vomiting, abdominal pain, CT abdomen pelvis significant for duodenitis -Clear liquid diet, advance as tolerated. -Continue with IV Rocephin and Flagyl   Splenic infarct -Has elevated and on imaging, this is most likely in the setting of hypercoagulable state in the setting of known neuroendocrine malignancy, and possible renal cell carcinoma -Did on Lovenox for anticoagulation, developed GI bleed, currently on hold -Extremely high risk for rebleeding due to her duodenal ulcer as a have discussed with GI, bleeding risks outweigh to clotting  at this point, so I have to hold anticoagulation, and this can be addressed in 2 to 3 weeks as an outpatient if her hemoglobin remained stable  Acute blood loss anemia/upper GI bleed due to duodenal ulcer -Patient developed melena, endoscopy today significant for large duodenal ulcer -Transition from IV Protonix to Protonix drip -continue to monitor CBC closely and transfuse as needed   Type 2 diabetes mellitus -Will keep an insulin sliding scale during hospital stay, will hold oral agents   Carcinoid tumor -Stage IV, low-grade neuroendocrine carcinoma, followed by Dr. Shirline Frees, will with innumerable pulmonary nodules, current plan for observation  Hospital delirium/acute metabolic encephalopathy -patient is more confused over last 24 hours, most likely due to hospital delirium, will continue with frequent orientation, minimize narcotics and benzos.    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  Likely related to all GI bleed Objective: Vitals:   11/10/22 1117 11/10/22 1120 11/10/22 1130 11/10/22 1140  BP: 116/61 124/73 (!) 134/49 (!) 154/55  Pulse: 81 70 60 68  Resp: (!) 26 20 19 19   Temp:      TempSrc:  SpO2: 94% 98% 100% 98%  Weight:      Height:        Intake/Output Summary (Last 24 hours) at 11/10/2022 1347 Last data filed at 11/09/2022 2300 Gross per 24 hour  Intake 850 ml  Output --   Net 850 ml   Filed Weights   11/08/22 0753  Weight: 81.2 kg    Examination:  Awake Alert, she is confused today, no apparent distress Symmetrical Chest wall movement, Good air movement bilaterally, CTAB RRR,No Gallops,Rubs or new Murmurs, No Parasternal Heave +ve B.Sounds, Abd Soft, No tenderness, No rebound - guarding or rigidity. No Cyanosis, Clubbing or edema, No new Rash or bruise       Data Reviewed: I have personally reviewed following labs and imaging studies  CBC: Recent Labs  Lab 11/07/22 1032 11/08/22 0811 11/09/22 0707 11/09/22 1043 11/09/22 1715 11/10/22 0359  WBC 10.0 16.8* 14.5*  --  10.3 8.2  NEUTROABS 8.1*  --   --   --   --   --   HGB 11.0* 10.3* 6.9* 7.3* 7.7* 8.4*  HCT 34.6* 32.5* 22.0* 24.0* 24.5* 27.2*  MCV 95.8 94.2 98.2  --  97.2 95.4  PLT 564* 585* 424*  --  382 301    Basic Metabolic Panel: Recent Labs  Lab 11/07/22 1032 11/08/22 0811 11/09/22 0707 11/10/22 0359  NA 136 134* 137 140  K 3.6 3.9 3.5 2.9*  CL 100 94* 101 106  CO2 25 24 25 25   GLUCOSE 261* 354* 261* 195*  BUN 9 15 29* 21  CREATININE 0.85 1.00 1.18* 1.01*  CALCIUM 9.7 9.7 8.3* 8.1*  MG  --  1.7  --  1.7  PHOS  --   --   --  2.9    GFR: Estimated Creatinine Clearance: 46.5 mL/min (A) (by C-G formula based on SCr of 1.01 mg/dL (H)).  Liver Function Tests: Recent Labs  Lab 11/07/22 1032 11/08/22 0811 11/09/22 0707  AST 12* 15 8*  ALT 13 12 14   ALKPHOS 101 84 59  BILITOT 0.5 0.6 0.4  PROT 7.8 7.4 5.1*  ALBUMIN 3.8 3.7 2.2*    CBG: Recent Labs  Lab 11/09/22 1219 11/09/22 1553 11/09/22 2228 11/10/22 0837 11/10/22 1206  GLUCAP 163* 169* 186* 193* 178*     No results found for this or any previous visit (from the past 240 hour(s)).       Radiology Studies: [place] EKG SITE RITE  Result Date: 11/09/2022 If Site Rite image not attached, placement could not be confirmed due to current cardiac rhythm.       Scheduled Meds:  Chlorhexidine  Gluconate Cloth  6 each Topical Daily   cyanocobalamin  1,000 mcg Oral Daily   gabapentin  300 mg Oral QPM   insulin aspart  0-15 Units Subcutaneous TID WC   insulin aspart  0-5 Units Subcutaneous QHS   latanoprost  1 drop Right Eye QHS   LORazepam  0.5 mg Intravenous Q8H   sodium chloride flush  10-40 mL Intracatheter Q12H   Continuous Infusions:  cefTRIAXone (ROCEPHIN)  IV 2 g (11/09/22 1609)   lactated ringers 75 mL/hr at 11/09/22 1612   metronidazole 500 mg (11/10/22 0401)   pantoprazole 8 mg/hr (11/10/22 1336)     LOS: 1 day       Huey Bienenstock, MD [facility]   To contact the attending provider between 7A-7P or the covering provider during after hours 7P-7A, please log into the web site [email or web address] and  access using universal [place] password for that web site. If you do not have the password, please call the hospital operator.  11/10/2022, 1:47 PM

## 2022-11-11 DIAGNOSIS — G9341 Metabolic encephalopathy: Secondary | ICD-10-CM | POA: Diagnosis not present

## 2022-11-11 LAB — GLUCOSE, CAPILLARY
Glucose-Capillary: 162 mg/dL — ABNORMAL HIGH (ref 70–99)
Glucose-Capillary: 145 mg/dL — ABNORMAL HIGH (ref 70–99)
Glucose-Capillary: 215 mg/dL — ABNORMAL HIGH (ref 70–99)
Glucose-Capillary: 222 mg/dL — ABNORMAL HIGH (ref 70–99)

## 2022-11-11 LAB — BASIC METABOLIC PANEL
Anion gap: 10 (ref 5–15)
BUN: 7 mg/dL — ABNORMAL LOW (ref 8–23)
CO2: 23 mmol/L (ref 22–32)
Calcium: 8.3 mg/dL — ABNORMAL LOW (ref 8.9–10.3)
Chloride: 102 mmol/L (ref 98–111)
Creatinine, Ser: 0.87 mg/dL (ref 0.44–1.00)
GFR, Estimated: >60 mL/min (ref >60)
Glucose, Bld: 204 mg/dL — ABNORMAL HIGH (ref 70–99)
Potassium: 3.5 mmol/L (ref 3.5–5.1)
Sodium: 135 mmol/L (ref 135–145)

## 2022-11-11 LAB — CBC
HCT: 28.7 % — ABNORMAL LOW (ref 36.0–46.0)
Hemoglobin: 9.3 g/dL — ABNORMAL LOW (ref 12.0–15.0)
MCH: 30.8 pg (ref 26.0–34.0)
MCHC: 32.4 g/dL (ref 30.0–36.0)
MCV: 95 fL (ref 80.0–100.0)
Platelets: 307 10*3/uL (ref 150–400)
RBC: 3.02 MIL/uL — ABNORMAL LOW (ref 3.87–5.11)
RDW: 14.4 % (ref 11.5–15.5)
WBC: 9.6 10*3/uL (ref 4.0–10.5)
nRBC: 0 % (ref 0.0–0.2)

## 2022-11-11 MED ORDER — GERHARDT'S BUTT CREAM
TOPICAL_CREAM | Freq: Two times a day (BID) | CUTANEOUS | Status: DC
  Administered 2022-11-12: 1 via TOPICAL
  Filled 2022-11-11: qty 1

## 2022-11-11 MED ORDER — LORAZEPAM 2 MG/ML IJ SOLN
0.5000 mg | Freq: Every evening | INTRAMUSCULAR | Status: DC | PRN
  Administered 2022-11-11 – 2022-11-12 (×2): 0.5 mg via INTRAVENOUS
  Filled 2022-11-11 (×2): qty 1

## 2022-11-11 MED ORDER — QUETIAPINE FUMARATE 25 MG PO TABS
25.0000 mg | ORAL_TABLET | ORAL | Status: DC
  Administered 2022-11-11 – 2022-11-12 (×2): 25 mg via ORAL
  Filled 2022-11-11 (×2): qty 1

## 2022-11-11 MED ORDER — POTASSIUM CHLORIDE CRYS ER 20 MEQ PO TBCR
40.0000 meq | EXTENDED_RELEASE_TABLET | Freq: Once | ORAL | Status: AC
  Administered 2022-11-11: 40 meq via ORAL
  Filled 2022-11-11: qty 2

## 2022-11-11 NOTE — Progress Notes (Signed)
PROGRESS NOTE    Michaela Santana  MWU:[id number] DOB: 04-21-1946 DOA: 11/08/2022 PCP: Shon Hale, MD    Chief Complaint  Patient presents with   Emesis    Brief Narrative:    Michaela Santana  is a 77 y.o. female,  with medical history significant for stage IV low-grade neuroendocrine carcinoma, type 2 diabetes mellitus, hypertension, and chronic back pain, patient with recent hospitalization 5/7 until 5/12 this month for diarrhea, related to norovirus. -Patient presents to ED today secondary to complaints of nausea, vomiting and abdominal pain, she reports symptoms started Tuesday, she reports vomiting, she denies coffee-ground emesis, hematochezia, melena, diarrhea or constipation, no fever, no chills, she does report abdominal pain. -In ED CT abdomen pelvis significant for duodenitis, but as well noted for splenic infarct, significant for leukocytosis, thrombocytosis, and hyperglycemia, as well her EKG showing heart block Mobitz 1, patient started on anticoagulation with Lovenox given splenic infarct, she developed melena overnight, please see discussion below.  Assessment & Plan:   Principal Problem:   Acute metabolic encephalopathy Active Problems:   Congestive heart failure (HCC)   Intractable nausea and vomiting   Hypertension   Renal mass      Intractable nausea and vomiting secondary to duodenitis -Patient presents with nausea, vomiting, abdominal pain, CT abdomen pelvis significant for duodenitis -Clear liquid diet, advance as tolerated. -Continue with IV Rocephin and Flagyl   Splenic infarct -Has elevated and on imaging, this is most likely in the setting of hypercoagulable state in the setting of known neuroendocrine malignancy, and possible renal cell carcinoma -Did on Lovenox for anticoagulation, developed GI bleed, currently on hold -Extremely high risk for rebleeding due to her duodenal ulcer as a have discussed with GI, bleeding risks outweigh to clotting  at this point, so I have to hold anticoagulation, and this can be addressed in 2 to 3 weeks as an outpatient if her hemoglobin remained stable  Acute blood loss anemia/upper GI bleed due to duodenal ulcer -Patient developed melena, endoscopy today significant for large duodenal ulcer -Transition from IV Protonix to Protonix drip, to finish 72 hours on IV Protonix, follow on H. pylori antigen, likely will need repeat endoscopy in 3 months. -continue to monitor CBC closely and transfuse as needed   Type 2 diabetes mellitus -Will keep an insulin sliding scale during hospital stay, will hold oral agents   Carcinoid tumor -Stage IV, low-grade neuroendocrine carcinoma, followed by Dr. Shirline Frees, will with innumerable pulmonary nodules, current plan for observation  Hospital delirium/acute metabolic encephalopathy -Will change her IV Ativan only to bedtime, and will start on Seroquel.      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   Leukocytosis -  due to above, continue to trend, monitor with IV fluids   Thrombocytosis -Due to infection, continue with IV fluids   Pretension -Continue with Norvasc, hold losartan   Second-degree AV block, Mobitz 1 - cardiology input greatly appreciated,   Dehydration -continue with IV fluid     DVT prophylaxis: lovenox on hold Code Status: Full Family Communication: Daughter at bedside Disposition:   Status is: Inpatient    Consultants:  Cardiology GI  Subjective:  Self is confused, daughter at bedside reports she had significant confusion daytime yesterday, per staff she had a good night sleep Objective: Vitals:   11/11/22 0900 11/11/22 1000 11/11/22 1100 11/11/22 1232  BP:  (!) 172/116    Pulse: 72 (!) 58 68   Resp: (!) 22 (!) 22 (!) 26  Temp:      TempSrc:    Oral  SpO2: 94% 97% 97%   Weight:      Height:        Intake/Output Summary (Last 24 hours) at 11/11/2022 1337 Last data filed at 11/11/2022 0651 Gross per 24 hour   Intake 3056.93 ml  Output --  Net 3056.93 ml   Filed Weights   11/08/22 0753  Weight: 81.2 kg    Examination:  Awake, confused Symmetrical Chest wall movement, Good air movement bilaterally, CTAB RRR,No Gallops,Rubs or new Murmurs, No Parasternal Heave +ve B.Sounds, Abd Soft, No tenderness, No rebound - guarding or rigidity. No Cyanosis, Clubbing or edema, No new Rash or bruise        Data Reviewed: I have personally reviewed following labs and imaging studies  CBC: Recent Labs  Lab 11/07/22 1032 11/07/22 1032 11/08/22 0811 11/09/22 0707 11/09/22 1043 11/09/22 1715 11/10/22 0359 11/10/22 1824 11/11/22 0312  WBC 10.0  --  16.8* 14.5*  --  10.3 8.2  --  9.6  NEUTROABS 8.1*  --   --   --   --   --   --   --   --   HGB 11.0*   < > 10.3* 6.9* 7.3* 7.7* 8.4* 9.0* 9.3*  HCT 34.6*  --  32.5* 22.0* 24.0* 24.5* 27.2* 28.5* 28.7*  MCV 95.8  --  94.2 98.2  --  97.2 95.4  --  95.0  PLT 564*  --  585* 424*  --  382 301  --  307   < > = values in this interval not displayed.    Basic Metabolic Panel: Recent Labs  Lab 11/07/22 1032 11/08/22 0811 11/09/22 0707 11/10/22 0359 11/11/22 0312  NA 136 134* 137 140 135  K 3.6 3.9 3.5 2.9* 3.5  CL 100 94* 101 106 102  CO2 25 24 25 25 23   GLUCOSE 261* 354* 261* 195* 204*  BUN 9 15 29* 21 7*  CREATININE 0.85 1.00 1.18* 1.01* 0.87  CALCIUM 9.7 9.7 8.3* 8.1* 8.3*  MG  --  1.7  --  1.7  --   PHOS  --   --   --  2.9  --     GFR: Estimated Creatinine Clearance: 54 mL/min (by C-G formula based on SCr of 0.87 mg/dL).  Liver Function Tests: Recent Labs  Lab 11/07/22 1032 11/08/22 0811 11/09/22 0707  AST 12* 15 8*  ALT 13 12 14   ALKPHOS 101 84 59  BILITOT 0.5 0.6 0.4  PROT 7.8 7.4 5.1*  ALBUMIN 3.8 3.7 2.2*    CBG: Recent Labs  Lab 11/10/22 1206 11/10/22 1713 11/10/22 2116 11/11/22 0924 11/11/22 1251  GLUCAP 178* 154* 165* 162* 145*     No results found for this or any previous visit (from the past 240  hour(s)).       Radiology Studies: No results found.      Scheduled Meds:  Chlorhexidine Gluconate Cloth  6 each Topical Daily   cyanocobalamin  1,000 mcg Oral Daily   gabapentin  300 mg Oral QPM   insulin aspart  0-15 Units Subcutaneous TID WC   insulin aspart  0-5 Units Subcutaneous QHS   latanoprost  1 drop Right Eye QHS   LORazepam  0.5 mg Intravenous Q8H   QUEtiapine  25 mg Oral QHS   sodium chloride flush  10-40 mL Intracatheter Q12H   Continuous Infusions:  cefTRIAXone (ROCEPHIN)  IV 2 g (11/10/22 1717)  lactated ringers 75 mL/hr at 11/11/22 1301   metronidazole 500 mg (11/11/22 0358)   pantoprazole 8 mg/hr (11/11/22 1303)     LOS: 2 days       Huey Bienenstock, MD [facility]   To contact the attending provider between 7A-7P or the covering provider during after hours 7P-7A, please log into the web site [email or web address] and access using universal [place] password for that web site. If you do not have the password, please call the hospital operator.  11/11/2022, 1:37 PM

## 2022-11-11 NOTE — Progress Notes (Signed)
Subjective: No further bleeding.  Objective: Vital signs in last 24 hours: Temp:  [97.7 F (36.5 C)-98 F (36.7 C)] 97.9 F (36.6 C) (06/02 0800) Pulse Rate:  [57-72] 68 (06/02 1100) Resp:  [16-26] 26 (06/02 1100) BP: (107-177)/(48-116) 172/116 (06/02 1000) SpO2:  [94 %-99 %] 97 % (06/02 1100) Weight change:  Last BM Date : 11/10/22  PE: GEN:  Awake, confused ABD:  Soft, non-tender  Lab Results: CBC    Component Value Date/Time   WBC 9.6 11/11/2022 0312   RBC 3.02 (L) 11/11/2022 0312   HGB 9.3 (L) 11/11/2022 0312   HGB 11.0 (L) 11/07/2022 1032   HCT 28.7 (L) 11/11/2022 0312   PLT 307 11/11/2022 0312   PLT 564 (H) 11/07/2022 1032   MCV 95.0 11/11/2022 0312   MCH 30.8 11/11/2022 0312   MCHC 32.4 11/11/2022 0312   RDW 14.4 11/11/2022 0312   LYMPHSABS 1.2 11/07/2022 1032   MONOABS 0.5 11/07/2022 1032   EOSABS 0.1 11/07/2022 1032   BASOSABS 0.1 11/07/2022 1032  .CMP     Component Value Date/Time   NA 135 11/11/2022 0312   K 3.5 11/11/2022 0312   CL 102 11/11/2022 0312   CO2 23 11/11/2022 0312   GLUCOSE 204 (H) 11/11/2022 0312   BUN 7 (L) 11/11/2022 0312   CREATININE 0.87 11/11/2022 0312   CREATININE 0.85 11/07/2022 1032   CALCIUM 8.3 (L) 11/11/2022 0312   PROT 5.1 (L) 11/09/2022 0707   ALBUMIN 2.2 (L) 11/09/2022 0707   AST 8 (L) 11/09/2022 0707   AST 12 (L) 11/07/2022 1032   ALT 14 11/09/2022 0707   ALT 13 11/07/2022 1032   ALKPHOS 59 11/09/2022 0707   BILITOT 0.4 11/09/2022 0707   BILITOT 0.5 11/07/2022 1032   GFRNONAA >60 11/11/2022 0312   GFRNONAA >60 11/07/2022 1032   GFRAA 29 (L) 07/21/2016 0446   Assessment:   Melena. Acute blood loss anemia. Large duodenal ulcer, likely source of #1 and #2 above. Metastatic neuroendocrine tumor. 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  Plan:   PPI gtt another 24 hours, likely transition to PO tomorrow. Serial CBCs. Transfuse as needed. Advance diet slowly. If recurrent acute GI bleeding, would  consider Interventional Radiology consultation for consideration of embolization. Avoid ASA/NSAIDs. Very high risk for rebleeding on anticoagulation at this point; I have advised withholding anticoagulation for at least one week, if at all possible, otherwise very high risk for significant rebleeding. Awaiting H. Pylori fecal antigen. Consider repeat endoscopy in 3 months to exclude unlikely possibility of metastatic tumor as cause of ulcer. [facility] will follow.   Michaela Santana 11/11/2022, 11:53 AM   Cell [phone] If no answer or after 5 PM call [phone]

## 2022-11-12 ENCOUNTER — Encounter (HOSPITAL_COMMUNITY): Payer: Self-pay | Admitting: Gastroenterology

## 2022-11-12 DIAGNOSIS — D735 Infarction of spleen: Secondary | ICD-10-CM | POA: Diagnosis not present

## 2022-11-12 DIAGNOSIS — G9341 Metabolic encephalopathy: Secondary | ICD-10-CM | POA: Diagnosis not present

## 2022-11-12 LAB — GLUCOSE, CAPILLARY
Glucose-Capillary: 201 mg/dL — ABNORMAL HIGH (ref 70–99)
Glucose-Capillary: 149 mg/dL — ABNORMAL HIGH (ref 70–99)
Glucose-Capillary: 176 mg/dL — ABNORMAL HIGH (ref 70–99)
Glucose-Capillary: 205 mg/dL — ABNORMAL HIGH (ref 70–99)

## 2022-11-12 LAB — CBC
HCT: 28.6 % — ABNORMAL LOW (ref 36.0–46.0)
Hemoglobin: 9.2 g/dL — ABNORMAL LOW (ref 12.0–15.0)
MCH: 30.9 pg (ref 26.0–34.0)
MCHC: 32.2 g/dL (ref 30.0–36.0)
MCV: 96 fL (ref 80.0–100.0)
Platelets: 266 10*3/uL (ref 150–400)
RBC: 2.98 MIL/uL — ABNORMAL LOW (ref 3.87–5.11)
RDW: 14.1 % (ref 11.5–15.5)
WBC: 7.7 10*3/uL (ref 4.0–10.5)
nRBC: 0 % (ref 0.0–0.2)

## 2022-11-12 LAB — BASIC METABOLIC PANEL
Anion gap: 10 (ref 5–15)
BUN: <5 mg/dL — ABNORMAL LOW (ref 8–23)
CO2: 25 mmol/L (ref 22–32)
Calcium: 8 mg/dL — ABNORMAL LOW (ref 8.9–10.3)
Chloride: 101 mmol/L (ref 98–111)
Creatinine, Ser: 0.95 mg/dL (ref 0.44–1.00)
GFR, Estimated: >60 mL/min (ref >60)
Glucose, Bld: 185 mg/dL — ABNORMAL HIGH (ref 70–99)
Potassium: 3.1 mmol/L — ABNORMAL LOW (ref 3.5–5.1)
Sodium: 136 mmol/L (ref 135–145)

## 2022-11-12 MED ORDER — PANTOPRAZOLE SODIUM 40 MG PO TBEC
40.0000 mg | DELAYED_RELEASE_TABLET | Freq: Two times a day (BID) | ORAL | Status: DC
  Administered 2022-11-12 – 2022-11-13 (×2): 40 mg via ORAL
  Filled 2022-11-12 (×2): qty 1

## 2022-11-12 MED ORDER — POTASSIUM CHLORIDE CRYS ER 20 MEQ PO TBCR
40.0000 meq | EXTENDED_RELEASE_TABLET | Freq: Four times a day (QID) | ORAL | Status: AC
  Administered 2022-11-12 (×2): 40 meq via ORAL
  Filled 2022-11-12 (×2): qty 2

## 2022-11-12 NOTE — Progress Notes (Addendum)
PROGRESS NOTE    Michaela Santana  ZOX:[id number] DOB: 12/16/1945 DOA: 11/08/2022 PCP: Michaela Hale, MD    Chief Complaint  Patient presents with   Emesis    Brief Narrative:    Michaela Santana  is a 77 y.o. female,  with medical history significant for stage IV low-grade neuroendocrine carcinoma, type 2 diabetes mellitus, hypertension, and chronic back pain, patient with recent hospitalization 5/7 until 5/12 this month for diarrhea, related to norovirus. -Patient presents to ED today secondary to complaints of nausea, vomiting and abdominal pain, she reports symptoms started Tuesday, she reports vomiting, she denies coffee-ground emesis, hematochezia, melena, diarrhea or constipation, no fever, no chills, she does report abdominal pain. -In ED CT abdomen pelvis significant for duodenitis, but as well noted for splenic infarct, significant for leukocytosis, thrombocytosis, and hyperglycemia, as well her EKG showing heart block Mobitz 1, patient started on anticoagulation with Lovenox given splenic infarct, she developed melena overnight, please see discussion below.  Assessment & Plan:   Principal Problem:   Acute metabolic encephalopathy Active Problems:   Congestive heart failure (HCC)   Intractable nausea and vomiting   Hypertension   Renal mass      Intractable nausea and vomiting secondary to duodenitis -Patient presents with nausea, vomiting, abdominal pain, CT abdomen pelvis significant for duodenitis -Clear liquid diet, advance as tolerated. -Continue with IV Rocephin and Flagyl for another 24 hours   Splenic infarct -Has elevated and on imaging, this is most likely in the setting of hypercoagulable state in the setting of known neuroendocrine malignancy, and possible renal cell carcinoma -Did on Lovenox for anticoagulation, developed GI bleed, currently on hold -Extremely high risk for rebleeding due to her duodenal ulcer as a have discussed with GI, bleeding risks  outweigh to clotting at this point, so I have to hold anticoagulation, and this can be addressed in 2 to 3 weeks as an outpatient if her hemoglobin remained stable  Acute blood loss anemia/upper GI bleed due to duodenal ulcer -Patient developed melena, endoscopy today significant for large duodenal ulcer -Transition from IV Protonix to Protonix drip, to finish 72 hours on IV Protonix, continue for another 24 hours.  Follow on H. pylori antigen, likely will need repeat endoscopy in 3 months. -continue to monitor CBC closely and transfuse as needed   Type 2 diabetes mellitus -Will keep an insulin sliding scale during hospital stay, will hold oral agents   Carcinoid tumor -Stage IV, low-grade neuroendocrine carcinoma, followed by Michaela Santana, will with innumerable pulmonary nodules, current plan for observation  Hospital delirium/acute metabolic encephalopathy -Manage Ativan to only at bedtime, continue with Seroquel, overall improving but remains confused today    Renal cell mass -Followed by Dr. Mena Santana   Leukocytosis -  due to above, continue to trend, monitor with IV fluids   Thrombocytosis -Due to infection, continue with IV fluids   Pretension -Continue with Norvasc, hold losartan   Second-degree AV block, Mobitz 1 - cardiology input greatly appreciated,   Dehydration -continue with IV fluid     DVT prophylaxis: lovenox on hold Code Status: Full Family Communication: Daughter at bedside Disposition:   Status is: Inpatient    Consultants:  Cardiology GI  Subjective:  No significant events overnight, patient is less confused she denies any abdominal pain, Objective: Vitals:   11/12/22 0000 11/12/22 0400 11/12/22 0800 11/12/22 1000  BP: (!) 175/81 127/61 (!) 152/62 (!) 163/78  Pulse: 79 (!) 52 (!) 58 (!) 53  Resp: 19 18 (!)  22 (!) 23  Temp:  98.7 F (37.1 C)  97.9 F (36.6 C)  TempSrc:  Axillary  Oral  SpO2: 100% 97% 92% 95%  Weight:      Height:         Intake/Output Summary (Last 24 hours) at 11/12/2022 1353 Last data filed at 11/12/2022 0600 Gross per 24 hour  Intake 1815.85 ml  Output 900 ml  Net 915.85 ml   Filed Weights   11/08/22 0753  Weight: 81.2 kg    Examination:  Awake, confused Symmetrical Chest wall movement, Good air movement bilaterally, CTAB RRR,No Gallops,Rubs or new Murmurs, No Parasternal Heave +ve B.Sounds, Abd Soft, No tenderness, No rebound - guarding or rigidity. No Cyanosis, Clubbing or edema, No new Rash or bruise        Data Reviewed: I have personally reviewed following labs and imaging studies  CBC: Recent Labs  Lab 11/07/22 1032 11/08/22 0811 11/09/22 0707 11/09/22 1043 11/09/22 1715 11/10/22 0359 11/10/22 1824 11/11/22 0312 11/12/22 0403  WBC 10.0   < > 14.5*  --  10.3 8.2  --  9.6 7.7  NEUTROABS 8.1*  --   --   --   --   --   --   --   --   HGB 11.0*   < > 6.9*   < > 7.7* 8.4* 9.0* 9.3* 9.2*  HCT 34.6*   < > 22.0*   < > 24.5* 27.2* 28.5* 28.7* 28.6*  MCV 95.8   < > 98.2  --  97.2 95.4  --  95.0 96.0  PLT 564*   < > 424*  --  382 301  --  307 266   < > = values in this interval not displayed.    Basic Metabolic Panel: Recent Labs  Lab 11/08/22 0811 11/09/22 0707 11/10/22 0359 11/11/22 0312 11/12/22 0403  NA 134* 137 140 135 136  K 3.9 3.5 2.9* 3.5 3.1*  CL 94* 101 106 102 101  CO2 24 25 25 23 25   GLUCOSE 354* 261* 195* 204* 185*  BUN 15 29* 21 7* <5*  CREATININE 1.00 1.18* 1.01* 0.87 0.95  CALCIUM 9.7 8.3* 8.1* 8.3* 8.0*  MG 1.7  --  1.7  --   --   PHOS  --   --  2.9  --   --     GFR: Estimated Creatinine Clearance: 49.4 mL/min (by C-G formula based on SCr of 0.95 mg/dL).  Liver Function Tests: Recent Labs  Lab 11/07/22 1032 11/08/22 0811 11/09/22 0707  AST 12* 15 8*  ALT 13 12 14   ALKPHOS 101 84 59  BILITOT 0.5 0.6 0.4  PROT 7.8 7.4 5.1*  ALBUMIN 3.8 3.7 2.2*    CBG: Recent Labs  Lab 11/11/22 1251 11/11/22 1718 11/11/22 2114 11/12/22 0834  11/12/22 1241  GLUCAP 145* 215* 222* 201* 149*     No results found for this or any previous visit (from the past 240 hour(s)).       Radiology Studies: No results found.      Scheduled Meds:  Chlorhexidine Gluconate Cloth  6 each Topical Daily   cyanocobalamin  1,000 mcg Oral Daily   gabapentin  300 mg Oral QPM   Gerhardt's butt cream   Topical BID   insulin aspart  0-15 Units Subcutaneous TID WC   insulin aspart  0-5 Units Subcutaneous QHS   latanoprost  1 drop Right Eye QHS   pantoprazole  40 mg Oral BID AC  QUEtiapine  25 mg Oral Q24H   sodium chloride flush  10-40 mL Intracatheter Q12H   Continuous Infusions:  cefTRIAXone (ROCEPHIN)  IV Stopped (11/11/22 1753)   metronidazole Stopped (11/12/22 0515)     LOS: 3 days       Huey Bienenstock, MD [facility]   To contact the attending provider between 7A-7P or the covering provider during after hours 7P-7A, please log into the web site [email or web address] and access using universal [place] password for that web site. If you do not have the password, please call the hospital operator.  11/12/2022, 1:53 PM

## 2022-11-12 NOTE — TOC Initial Note (Signed)
Transition of Care [facility]) - Initial/Assessment Note    Patient Details  Name: Michaela Santana MRN: [id number] Date of Birth: 01-17-1946  [facility] of [facility]) CM/SW Contact:    Mearl Latin, LCSW Phone Number: 11/12/2022, 6:18 PM  Clinical Narrative:                 Patient admitted from home. Will ensure patient is still active with [facility].   Expected Discharge Plan: Home w Home Health Services Barriers to Discharge: Continued Medical Work up   Patient Goals and CMS Choice            Expected Discharge Plan and Services   Discharge Planning Services: CM Consult Post Acute Care Choice: Home Health                                        Prior Living Arrangements/Services     Patient language and need for interpreter reviewed:: Yes        Need for Family Participation in Patient Care: Yes (Comment) Care giver support system in place?: Yes (comment)   Criminal Activity/Legal Involvement Pertinent to Current Situation/Hospitalization: No - Comment as needed  Activities of Daily Living Home Assistive Devices/Equipment: Walker (specify type), Cane (specify quad or straight) ADL Screening (condition at time of admission) Patient's cognitive ability adequate to safely complete daily activities?: Yes Is the patient deaf or have difficulty hearing?: Yes Does the patient have difficulty seeing, even when wearing glasses/contacts?: No Does the patient have difficulty concentrating, remembering, or making decisions?: Yes Patient able to express need for assistance with ADLs?: Yes Does the patient have difficulty dressing or bathing?: Yes Independently performs ADLs?: No Communication: Needs assistance Is this a change from baseline?: Change from baseline, expected to last >3 days Dressing (OT): Needs assistance Is this a change from baseline?: Change from baseline, expected to last >3 days Grooming: Needs assistance Is this a change from  baseline?: Change from baseline, expected to last >3 days Feeding: Needs assistance Does the patient have difficulty walking or climbing stairs?: Yes Weakness of Legs: Both Weakness of Arms/Hands: None  Permission Sought/Granted                  Emotional Assessment   Attitude/Demeanor/Rapport: Unable to Assess Affect (typically observed): Unable to Assess Orientation: : Oriented to Self Alcohol / Substance Use: Not Applicable Psych Involvement: No (comment)  Admission diagnosis:  Dehydration [E86.0] Hyponatremia [E87.1] Splenic infarct [D73.5] Vomiting, persistent, in adult [R11.15] Diabetes mellitus due to underlying condition with hyperglycemia, without long-term current use of insulin (HCC) [E08.65] Acute metabolic encephalopathy [G93.41] Leukocytosis, unspecified type [D72.829] Patient Active Problem List   Diagnosis Date Noted   Acute metabolic encephalopathy 11/08/2022   Urinary retention 10/22/2022   Renal mass 10/22/2022   Diarrhea 10/18/2022   Nausea & vomiting 10/16/2022   Hyponatremia 10/16/2022   Leucocytosis 10/16/2022   Acute encephalopathy 10/26/2021   Hypertension    Type II diabetes mellitus (HCC)    Prolonged QT interval    Atrioventricular block, Mobitz type 1, Wenckebach    Lung cancer, primary, 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, left (HCC) 08/18/2021   Multiple pulmonary nodules 07/19/2021   Intractable nausea and vomiting 06/09/2021   Spondylosis without myelopathy or radiculopathy, lumbar region 10/17/2016   CHF (congestive heart failure) (HCC) 07/17/2016   Sepsis (HCC) 07/17/2016   Influenza  07/17/2016   Congestive heart failure (HCC)    Hypotension    Urinary tract infection with hematuria    PCP:  Shon Hale, MD Pharmacy:   [facility] [phone] [place], [place] - [address] AT [facility] [address] & [facility] [address] [place] [address] Phone: [phone] Fax: [phone]  [facility]  [place] [facility] [address] [place] [address] Phone: [phone] Fax: [phone]     Social Determinants of Health (SDOH) Social History: SDOH Screenings   Food Insecurity: No Food Insecurity (11/08/2022)  Housing: Low Risk  (11/08/2022)  Transportation Needs: No Transportation Needs (11/08/2022)  Utilities: Not At Risk (11/08/2022)  Tobacco Use: Low Risk  (11/10/2022)   SDOH Interventions:     Readmission Risk Interventions     No data to display

## 2022-11-12 NOTE — Care Management Important Message (Signed)
Important Message  Patient Details  Name: Michaela Santana MRN: [id number] Date of Birth: [birth year]   Medicare Important Message Given:  Yes     Dorena Bodo 11/12/2022, 3:44 PM

## 2022-11-12 NOTE — Progress Notes (Signed)
Subjective: Few coffee grounds in stool. No abdominal pain.  Objective: Vital signs in last 24 hours: Temp:  [97.9 F (36.6 C)-99.8 F (37.7 C)] 97.9 F (36.6 C) (06/03 1000) Pulse Rate:  [52-79] 53 (06/03 1000) Resp:  [18-23] 23 (06/03 1000) BP: (127-175)/(61-81) 163/78 (06/03 1000) SpO2:  [92 %-100 %] 95 % (06/03 1000) Weight change:  Last BM Date : 11/11/22  PE: GEN:  NAD, overweight ABD:  Soft, non-tender, protuberant  Lab Results: CBC    Component Value Date/Time   WBC 7.7 11/12/2022 0403   RBC 2.98 (L) 11/12/2022 0403   HGB 9.2 (L) 11/12/2022 0403   HGB 11.0 (L) 11/07/2022 1032   HCT 28.6 (L) 11/12/2022 0403   PLT 266 11/12/2022 0403   PLT 564 (H) 11/07/2022 1032   MCV 96.0 11/12/2022 0403   MCH 30.9 11/12/2022 0403   MCHC 32.2 11/12/2022 0403   RDW 14.1 11/12/2022 0403   LYMPHSABS 1.2 11/07/2022 1032   MONOABS 0.5 11/07/2022 1032   EOSABS 0.1 11/07/2022 1032   BASOSABS 0.1 11/07/2022 1032  CMP     Component Value Date/Time   NA 136 11/12/2022 0403   K 3.1 (L) 11/12/2022 0403   CL 101 11/12/2022 0403   CO2 25 11/12/2022 0403   GLUCOSE 185 (H) 11/12/2022 0403   BUN <5 (L) 11/12/2022 0403   CREATININE 0.95 11/12/2022 0403   CREATININE 0.85 11/07/2022 1032   CALCIUM 8.0 (L) 11/12/2022 0403   PROT 5.1 (L) 11/09/2022 0707   ALBUMIN 2.2 (L) 11/09/2022 0707   AST 8 (L) 11/09/2022 0707   AST 12 (L) 11/07/2022 1032   ALT 14 11/09/2022 0707   ALT 13 11/07/2022 1032   ALKPHOS 59 11/09/2022 0707   BILITOT 0.4 11/09/2022 0707   BILITOT 0.5 11/07/2022 1032   GFRNONAA >60 11/12/2022 0403   GFRNONAA >60 11/07/2022 1032   GFRAA 29 (L) 07/21/2016 0446   Assessment:   Melena. 2.  Acute blood loss anemia. 3.  Large duodenal ulcer, likely source of #1 and #2 above. 4.  Metastatic neuroendocrine tumor. 5.  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  Plan:   Advance diet slowly as tolerated. Serial CBCs, transfuse as needed. Change to PPI oral. No  ASA/NSAIDs. Very high risk for rebleeding on anticoagulation; would avoid anticoagulants for at least one week if at all possible. Consider repeat endoscopy 3 months to assess for ulcer healing. H. Pylori fecal antigen ordered. [facility] will sign-off; please call with questions; thank you for the consultation.   Freddy Jaksch 11/12/2022, 1:26 PM   Cell [phone] If no answer or after 5 PM call [phone]

## 2022-11-12 NOTE — Plan of Care (Signed)

## 2022-11-12 NOTE — Progress Notes (Signed)
Physical Therapy Treatment Patient Details Name: Michaela Santana MRN: [id number] DOB: 06-27-1945 Today's Date: 11/12/2022   History of Present Illness The pt is a 77 yo female presenting 5/30 with AMS and vomiting. CT abdomen showed duodenitis and splenic infarct, ECG noted 2nd degree AV block. Admission complicated by melanotic stool. PMH includes: dementia, stage IV low-grade neuroendocrine carcinoma, DM II, HTN, and chronic back pain.    PT Comments    Pt greeted long sitting in bed and agreeable to session with continued progress towards acute goals, however pt continues to to be limited by impaired cognition from baseline, decreased activity tolerance, general weakness and fatigue and impaired balance/postural reactions. Pt able to complete bed mobility at supervision level with light cues to scoot out to EOB to place feet on floor. Pt requiring min A to transfer to stand and progress gait with RW for support and assist to steady with cues for navigation and motivation as pt stopping frequently and with poor environmental awareness. Pt daughters present and encouraing throughout session. Current plan remains appropriate to address deficits and maximize functional independence and decrease caregiver burden. Pt continues to benefit from skilled PT services to progress toward functional mobility goals.    Recommendations for follow up therapy are one component of a multi-disciplinary discharge planning process, led by the attending physician.  Recommendations may be updated based on patient status, additional functional criteria and insurance authorization.  Follow Up Recommendations       Assistance Recommended at Discharge Frequent or constant Supervision/Assistance  Patient can return home with the following A little help with walking and/or transfers;A little help with bathing/dressing/bathroom;Assistance with cooking/housework;Direct supervision/assist for medications management;Direct  supervision/assist for financial management;Assist for transportation;Help with stairs or ramp for entrance   Equipment Recommendations  None recommended by PT    Recommendations for Other Services       Precautions / Restrictions Precautions Precautions: Fall Restrictions Weight Bearing Restrictions: No     Mobility  Bed Mobility Overal bed mobility: Needs Assistance Bed Mobility: Supine to Sit     Supine to sit: Supervision, HOB elevated     General bed mobility comments: supervision for safety, cues to scoot out to EOB    Transfers Overall transfer level: Needs assistance Equipment used: Rolling walker (2 wheels) Transfers: Sit to/from Stand, Bed to chair/wheelchair/BSC Sit to Stand: Min assist           General transfer comment: min A to rise from low EOB to RW, cues for hand placement    Ambulation/Gait Ambulation/Gait assistance: Min assist Gait Distance (Feet): 59 Feet Assistive device: Rolling walker (2 wheels) Gait Pattern/deviations: Step-through pattern, Staggering left, Staggering right, Trunk flexed, Shuffle, Knee flexed in stance - right, Knee flexed in stance - left Gait velocity: decreased     General Gait Details: limited by fatigue, min A to steady and cues to continue gait as pt stopping frequently, knees flexed throughout in stance phase   Stairs             Wheelchair Mobility    Modified Rankin (Stroke Patients Only)       Balance Overall balance assessment: Needs assistance Sitting-balance support: No upper extremity supported, Feet supported Sitting balance-Leahy Scale: Good Sitting balance - Comments: sitting on BSC and also toilet   Standing balance support: Bilateral upper extremity supported, During functional activity Standing balance-Leahy Scale: Poor Standing balance comment: Reliant on external support for balance  Cognition Arousal/Alertness: Awake/alert Behavior During  Therapy: WFL for tasks assessed/performed Overall Cognitive Status: History of cognitive impairments - at baseline Area of Impairment: Orientation, Attention, Memory, Following commands, Safety/judgement, Awareness, Problem solving                 Orientation Level: Disoriented to, Place, Situation, Time Current Attention Level: Focused Memory: Decreased recall of precautions, Decreased short-term memory Following Commands: Follows one step commands inconsistently, Follows one step commands with increased time Safety/Judgement: Decreased awareness of safety, Decreased awareness of deficits Awareness: Intellectual Problem Solving: Slow processing, Decreased initiation, Requires verbal cues, Requires tactile cues          Exercises      General Comments General comments (skin integrity, edema, etc.): VSS on RA, pt daughters present throughout session, endorsing improvement in cogniion and mobility, but not yet back to baseline      Pertinent Vitals/Pain Pain Assessment Pain Assessment: No/denies pain    Home Living                          Prior Function            PT Goals (current goals can now be found in the care plan section) Acute Rehab PT Goals Patient Stated Goal: to return home PT Goal Formulation: With patient Time For Goal Achievement: 11/23/22 Progress towards PT goals: Progressing toward goals    Frequency    Min 3X/week      PT Plan Current plan remains appropriate    Co-evaluation              AM-PAC PT "6 Clicks" Mobility   Outcome Measure  Help needed turning from your back to your side while in a flat bed without using bedrails?: A Little Help needed moving from lying on your back to sitting on the side of a flat bed without using bedrails?: A Little Help needed moving to and from a bed to a chair (including a wheelchair)?: A Little Help needed standing up from a chair using your arms (e.g., wheelchair or bedside  chair)?: A Little Help needed to walk in hospital room?: A Little Help needed climbing 3-5 steps with a railing? : A Little 6 Click Score: 18    End of Session Equipment Utilized During Treatment: Gait belt Activity Tolerance: Patient limited by fatigue;Patient tolerated treatment well Patient left: with call bell/phone within reach;in chair;with chair alarm set;with family/visitor present Nurse Communication: Mobility status PT Visit Diagnosis: Other abnormalities of gait and mobility (R26.89);Muscle weakness (generalized) (M62.81)     Time: 1610-9604 PT Time Calculation (min) (ACUTE ONLY): 23 min  Charges:  $Gait Training: 8-22 mins $Therapeutic Activity: 8-22 mins                     Siboney Requejo R. PTA Acute Rehabilitation Services Office: [phone]   Catalina Antigua 11/12/2022, 2:30 PM

## 2022-11-12 NOTE — Progress Notes (Signed)
OT Cancellation Note  Patient Details Name: ALIZAY SCALISI MRN: [id number] DOB: 1945/06/28   Cancelled Treatment:    Reason Eval/Treat Not Completed: Other (comment) Pt just finished working with PT and currently eating lunch. Will follow up at a later time for OT   Lorre Munroe 11/12/2022, 1:30 PM

## 2022-11-13 ENCOUNTER — Telehealth (HOSPITAL_COMMUNITY): Payer: Self-pay | Admitting: Pharmacy Technician

## 2022-11-13 ENCOUNTER — Other Ambulatory Visit (HOSPITAL_COMMUNITY): Payer: Self-pay

## 2022-11-13 DIAGNOSIS — K269 Duodenal ulcer, unspecified as acute or chronic, without hemorrhage or perforation: Secondary | ICD-10-CM | POA: Insufficient documentation

## 2022-11-13 DIAGNOSIS — D735 Infarction of spleen: Secondary | ICD-10-CM | POA: Insufficient documentation

## 2022-11-13 DIAGNOSIS — D62 Acute posthemorrhagic anemia: Secondary | ICD-10-CM | POA: Insufficient documentation

## 2022-11-13 DIAGNOSIS — G9341 Metabolic encephalopathy: Secondary | ICD-10-CM | POA: Diagnosis not present

## 2022-11-13 LAB — BASIC METABOLIC PANEL
Anion gap: 9 (ref 5–15)
BUN: <5 mg/dL — ABNORMAL LOW (ref 8–23)
CO2: 23 mmol/L (ref 22–32)
Calcium: 8.3 mg/dL — ABNORMAL LOW (ref 8.9–10.3)
Chloride: 105 mmol/L (ref 98–111)
Creatinine, Ser: 0.99 mg/dL (ref 0.44–1.00)
GFR, Estimated: 59 mL/min — ABNORMAL LOW (ref >60)
Glucose, Bld: 175 mg/dL — ABNORMAL HIGH (ref 70–99)
Potassium: 3.7 mmol/L (ref 3.5–5.1)
Sodium: 137 mmol/L (ref 135–145)

## 2022-11-13 LAB — CBC
HCT: 29 % — ABNORMAL LOW (ref 36.0–46.0)
Hemoglobin: 9.4 g/dL — ABNORMAL LOW (ref 12.0–15.0)
MCH: 30.9 pg (ref 26.0–34.0)
MCHC: 32.4 g/dL (ref 30.0–36.0)
MCV: 95.4 fL (ref 80.0–100.0)
Platelets: 252 10*3/uL (ref 150–400)
RBC: 3.04 MIL/uL — ABNORMAL LOW (ref 3.87–5.11)
RDW: 14.1 % (ref 11.5–15.5)
WBC: 7 10*3/uL (ref 4.0–10.5)
nRBC: 0 % (ref 0.0–0.2)

## 2022-11-13 LAB — GLUCOSE, CAPILLARY
Glucose-Capillary: 183 mg/dL — ABNORMAL HIGH (ref 70–99)
Glucose-Capillary: 207 mg/dL — ABNORMAL HIGH (ref 70–99)

## 2022-11-13 LAB — H. PYLORI ANTIGEN, STOOL: H. Pylori Stool Ag, Eia: NEGATIVE

## 2022-11-13 MED ORDER — PANTOPRAZOLE SODIUM 40 MG PO TBEC
40.0000 mg | DELAYED_RELEASE_TABLET | Freq: Two times a day (BID) | ORAL | 0 refills | Status: DC
  Filled 2022-11-13: qty 60, 30d supply, fill #0

## 2022-11-13 NOTE — Telephone Encounter (Signed)
Patient Advocate Encounter   Received notification that prior authorization for Pantoprazole Sodium 40MG  dr tablets is required.   PA submitted on 11/13/2022 Key [id number] Insurance [employer] Electronic PA Form Status is pending       Roland Earl, CPhT Pharmacy Patient Advocate Specialist [facility] Patient Advocate Team Direct Number: [phone]  Fax: [phone]

## 2022-11-13 NOTE — Plan of Care (Signed)

## 2022-11-13 NOTE — Progress Notes (Signed)
OT Cancellation Note  Patient Details Name: Michaela Santana MRN: [id number] DOB: Oct 30, 1945   Cancelled Treatment:    Reason Eval/Treat Not Completed: Other (comment) Attempted OT session though per staff, PICC line just removed and on bedrest for 30 min. Will follow up for OT session as schedule permits.   Lorre Munroe 11/13/2022, 11:11 AM

## 2022-11-13 NOTE — TOC Transition Note (Signed)
Transition of Care [facility]) - CM/SW Discharge Note   Patient Details  Name: Michaela Santana MRN: [id number] Date of Birth: 04/03/1946  [facility] CM/SW Contact:  Gordy Clement, RN Phone Number: 11/13/2022, 9:04 AM   Clinical Narrative:     Patient will dc to home today with family.  PT and OT are recommending Home Health- [facility] will continue to provide services. No DME needed. Family to transport .   No additional TOC needs        Barriers to Discharge: Continued Medical Work up   Patient Goals and CMS Choice      Discharge Placement                         Discharge Plan and Services Additional resources added to the After Visit Summary for     Discharge Planning Services: CM Consult Post Acute Care Choice: Home Health                               Social Determinants of Health (SDOH) Interventions SDOH Screenings   Food Insecurity: No Food Insecurity (11/08/2022)  Housing: Low Risk  (11/08/2022)  Transportation Needs: No Transportation Needs (11/08/2022)  Utilities: Not At Risk (11/08/2022)  Tobacco Use: Low Risk  (11/12/2022)     Readmission Risk Interventions     No data to display

## 2022-11-13 NOTE — Discharge Summary (Addendum)
Physician Discharge Summary  Michaela Santana:[id number] DOB: 02/19/1946 DOA: 11/08/2022  PCP: Shon Hale, MD  Admit date: 11/08/2022 Discharge date: 11/13/2022  Admitted From: (Home) Disposition:  (Home )  Recommendations for Outpatient Follow-up:  Follow up with PCP in 1-2 weeks Please obtain BMP/CBC in one week Recommendation.  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  Home Health: (YES)   Discharge Condition: (Stable)  Diet recommendation: Heart Healthy / Carb Modified   Brief/Interim Summary:  Michaela Santana  is a 77 y.o. female,  with medical history significant for stage IV low-grade neuroendocrine carcinoma, type 2 diabetes mellitus, hypertension, and chronic back pain, patient with recent hospitalization 5/7 until 5/12 this month for diarrhea, related to norovirus. -Patient presents to ED today secondary to complaints of nausea, vomiting and abdominal pain, she reports symptoms started Tuesday, she reports vomiting, she denies coffee-ground emesis, hematochezia, melena, diarrhea or constipation, no fever, no chills, she does report abdominal pain. -In ED CT abdomen pelvis significant for duodenitis, but as well noted for splenic infarct, significant for leukocytosis, thrombocytosis, and hyperglycemia, as well her EKG showing heart block Mobitz 1, patient started on anticoagulation with Lovenox given splenic infarct, she developed melena acute blood loss anemia, endoscopy by [facility] significant for an ulcer.      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 and on imaging, this is most likely in the setting of hypercoagulable state in the setting of known neuroendocrine malignancy, and possible renal cell carcinoma -Did on Lovenox for anticoagulation, developed GI bleed, currently on hold -Extremely high risk for rebleeding due to her duodenal ulcer as a have discussed with GI, bleeding risks outweigh to clotting at this point, so I have to hold anticoagulation, and this can be addressed in 2 to 3 weeks as an outpatient if her hemoglobin remained stable   Acute blood loss anemia/upper GI bleed due to duodenal ulcer -Patient developed melena, endoscopy  significant for large duodenal ulcer she was treated with IV Protonix for 72 hours, currently transition to Protonix 40 mg p.o. twice daily -  Fecal H. pylori antigen  is negative,  - likely will need repeat endoscopy in 3 months.   Type 2 diabetes mellitus -resume home regimen at time of discharge   Carcinoid tumor -Stage IV, low-grade neuroendocrine carcinoma, followed by Dr. Shirline Frees, will with innumerable pulmonary nodules, current plan for observation   Hospital delirium/acute metabolic encephalopathy -Had significant hospital delirium, this much improved at this point.     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   Leukocytosis -  due to above, continue to trend, monitor with IV fluids   Thrombocytosis -Due to infection,   hypertension -resume home meds   Second-degree AV block, Mobitz 1 - cardiology input greatly appreciated,   Dehydration -treated  with IV fluid  Discharge Diagnoses:  Principal Problem:   Acute metabolic encephalopathy Active Problems:   Congestive heart failure (HCC)   Intractable nausea and vomiting   Hypertension   Renal mass  Acute blood loss anemia   Duodenal ulcer   Splenic  infarct    Discharge Instructions  Discharge Instructions     Diet - low sodium heart healthy   Complete by: As directed    Discharge instructions   Complete by: As directed    Follow with Primary MD Shon Hale, MD in 7 days   Get CBC, CMP, checked  by Primary MD next visit.    Activity: As tolerated with Full fall precautions use walker/cane & assistance as needed   Disposition Home    Diet: Heart Healthy    On your next visit with your primary care physician please Get Medicines reviewed and adjusted.   Please request your Prim.MD to go over all Hospital Tests and Procedure/Radiological results at the follow up, please get all Hospital records sent to your Prim MD by signing hospital release before you go home.   If you experience worsening of your admission symptoms, develop shortness of breath, life threatening emergency, suicidal or homicidal thoughts you must seek medical attention immediately by calling 911 or calling your MD immediately  if symptoms less severe.  You Must read complete instructions/literature along with all the possible adverse reactions/side effects for all the Medicines you take and that have been prescribed to you. Take any new Medicines after you have completely understood and accpet all the possible adverse reactions/side effects.   Do not drive, operating heavy machinery, perform activities at heights, swimming or participation in water activities or provide baby sitting services if your were admitted for syncope or siezures until you have seen by Primary MD or a Neurologist and advised to do so again.  Do not drive when taking Pain medications.    Do not take more than prescribed Pain, Sleep and Anxiety Medications  Special Instructions: If you have smoked or chewed Tobacco  in the last 2 yrs please stop smoking, stop any regular Alcohol  and or any Recreational drug use.  Wear Seat belts while driving.   Please note  You  were cared for by a hospitalist during your hospital stay. If you have any questions about your discharge medications or the care you received while you were in the hospital after you are discharged, you can call the unit and asked to speak with the hospitalist on call if the hospitalist that took care of you is not available. Once you are discharged, your primary care physician will handle any further medical issues. Please note that NO REFILLS for any discharge medications will be authorized once you are discharged, as it is imperative that you return to your primary care physician (or establish a relationship with a primary care physician if you do not have one) for your aftercare needs so that they can reassess your need for medications and monitor your lab values.   Increase activity slowly   Complete by: As directed       Allergies as of 11/13/2022       Reactions   Codeine Hypertension   Alprazolam Other (See Comments)   lethargic        Medication List     STOP taking these medications    famotidine 20 MG tablet Commonly known as: PEPCID       TAKE these medications    acetaminophen 650 MG CR tablet Commonly known as: TYLENOL Take 650 mg by mouth every 8 (eight) hours as needed for pain.   albuterol 108 (90 Base) MCG/ACT inhaler Commonly known as: VENTOLIN  HFA Inhale into the lungs every 6 (six) hours as needed for wheezing or shortness of breath.   amLODipine 5 MG tablet Commonly known as: NORVASC Take 5 mg by mouth daily.   calcium carbonate 500 MG chewable tablet Commonly known as: TUMS - dosed in mg elemental calcium Chew 1 tablet by mouth daily.   cyanocobalamin 1000 MCG tablet Commonly known as: VITAMIN B12 Take 1 tablet (1,000 mcg total) by mouth daily.   DULoxetine 30 MG capsule Commonly known as: CYMBALTA Take 30 mg by mouth daily.   ferrous sulfate 325 (65 FE) MG EC tablet Take 325 mg by mouth daily.   freestyle lancets   FreeStyle Lite  Devi USE UTD   FREESTYLE LITE test strip Generic drug: glucose blood USE TO CHECK FASTING GLUCOSE IN THE MORNING AND 2 HOURS AFTER DINNER OR LUNCH   gabapentin 300 MG capsule Commonly known as: NEURONTIN Take 300 mg by mouth every evening.   glipiZIDE 5 MG 24 hr tablet Commonly known as: GLUCOTROL XL Take 5 mg by mouth daily with breakfast.   latanoprost 0.005 % ophthalmic solution Commonly known as: XALATAN Place 1 drop into the right eye at bedtime.   loratadine 10 MG tablet Commonly known as: CLARITIN Take 10 mg by mouth daily as needed for allergies.   LORazepam 0.5 MG tablet Commonly known as: ATIVAN Take 0.5 mg by mouth every 8 (eight) hours.   losartan 100 MG tablet Commonly known as: COZAAR Take 0.5 tablets (50 mg total) by mouth daily.   magnesium oxide 400 (240 Mg) MG tablet Commonly known as: MAG-OX Take 800 mg by mouth 2 (two) times daily.   ondansetron 8 MG tablet Commonly known as: ZOFRAN Take 8 mg by mouth every 8 (eight) hours as needed for nausea/vomiting.   oxymetazoline 0.05 % nasal spray Commonly known as: AFRIN Place 1 spray into both nostrils 2 (two) times daily.   pantoprazole 40 MG tablet Commonly known as: PROTONIX Take 1 tablet (40 mg total) by mouth 2 (two) times daily before a meal.   QUEtiapine 25 MG tablet Commonly known as: SEROQUEL Take 12.5 mg by mouth every other day.   SYSTANE CONTACTS OP Apply 0.4 drops to eye as needed.        Allergies  Allergen Reactions   Codeine Hypertension   Alprazolam Other (See Comments)    lethargic    Consultations: GI cardiolgoy   Procedures/Studies: [place] EKG SITE RITE  Result Date: 11/09/2022 If Site Rite image not attached, placement could not be confirmed due to current cardiac rhythm.  CT ABDOMEN PELVIS W CONTRAST  Result Date: 11/08/2022 CLINICAL DATA:  One day history of vomiting associated with increased confusion EXAM: CT ABDOMEN AND PELVIS WITH CONTRAST TECHNIQUE:  Multidetector CT imaging of the abdomen and pelvis was performed using the standard protocol following bolus administration of intravenous contrast. RADIATION DOSE REDUCTION: This exam was performed according to the departmental dose-optimization program which includes automated exposure control, adjustment of the mA and/or kV according to patient size and/or use of iterative reconstruction technique. CONTRAST:  80mL OMNIPAQUE IOHEXOL 300 MG/ML  SOLN COMPARISON:  CTA abdomen and pelvis dated 10/17/2022 FINDINGS: Lower chest: Innumerable bilateral pulmonary nodules in keeping with known neuroendocrine tumor. No pleural effusion or pneumothorax demonstrated. Partially imaged heart size is normal. Hepatobiliary: Hypoattenuation along the falciform ligament may reflect perfusional variation or focal steatosis. no intra or extrahepatic biliary ductal dilation. Cholecystectomy. Pancreas: No focal lesions or main ductal dilation. Spleen: New, wedge-shaped  hypoattenuation of the spleen. Adrenals/Urinary Tract: No adrenal nodules. No suspicious renal mass or hydronephrosis. Punctate nonobstructing left renal stone. No focal bladder wall thickening. Stomach/Bowel: Moderate hiatal hernia. Normal appearance of the stomach. Persistent mural thickening of the duodenal with slightly decreased periduodenal stranding. Duodenal diverticulum. Normal appendix. Vascular/Lymphatic: Aortic atherosclerosis. No enlarged abdominal or pelvic lymph nodes. Reproductive: No adnexal masses. Other: No free fluid, fluid collection, or free air. Musculoskeletal: No acute or abnormal lytic or blastic osseous lesions. Multilevel degenerative changes of the partially imaged thoracic and lumbar spine. IMPRESSION: 1. New, wedge-shaped hypoattenuation of the spleen, suspicious for splenic infarct. 2. Persistent mural thickening of the duodenum with slightly decreased periduodenal stranding, likely improving duodenitis. 3. Innumerable bilateral pulmonary  nodules in keeping with known neuroendocrine tumor. 4. Moderate hiatal hernia. 5. Aortic Atherosclerosis (ICD10-I70.0). Electronically Signed   By: Agustin Cree M.D.   On: 11/08/2022 10:36   DG Chest Portable 1 View  Result Date: 11/08/2022 CLINICAL DATA:  Cough and vomiting EXAM: PORTABLE CHEST 1 VIEW COMPARISON:  Chest radiograph dated 10/19/2022 FINDINGS: Normal lung volumes. Rounded retrocardiac opacity in keeping with known hiatal hernia. No new focal consolidations. No pleural effusion or pneumothorax. The heart size and mediastinal contours are within normal limits. No acute osseous abnormality. IMPRESSION: 1. No acute cardiopulmonary process. 2. Rounded retrocardiac opacity in keeping with known hiatal hernia. Electronically Signed   By: Agustin Cree M.D.   On: 11/08/2022 09:28   CT HEAD WO CONTRAST ( )  Result Date: 10/20/2022 CLINICAL DATA:  Altered mental status EXAM: CT HEAD WITHOUT CONTRAST TECHNIQUE: Contiguous axial images were obtained from the base of the skull through the vertex without intravenous contrast. RADIATION DOSE REDUCTION: This exam was performed according to the departmental dose-optimization program which includes automated exposure control, adjustment of the mA and/or kV according to patient size and/or use of iterative reconstruction technique. COMPARISON:  None Available. FINDINGS: Brain: There is no mass, hemorrhage or extra-axial collection. The size and configuration of the ventricles and extra-axial CSF spaces are normal. There is hypoattenuation of the white matter, most commonly indicating chronic small vessel disease. Vascular: No abnormal hyperdensity of the major intracranial arteries or dural venous sinuses. No intracranial atherosclerosis. Skull: The visualized skull base, calvarium and extracranial soft tissues are normal. Sinuses/Orbits: No fluid levels or advanced mucosal thickening of the visualized paranasal sinuses. No mastoid or middle ear effusion. The orbits  are normal. 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 Electronically Signed   By: Deatra Robinson M.D.   On: 10/20/2022 21:50   DG Chest Port 1 View  Result Date: 10/19/2022 CLINICAL DATA:  Status post PICC line EXAM: PORTABLE CHEST 1 VIEW COMPARISON:  Chest x-ray 04/17/2022 FINDINGS: Right upper extremity PICC terminates over the distal SVC. Patient is rotated. The heart size and mediastinal contours are within normal limits. Both lungs are clear. No pneumothorax. The visualized skeletal structures are unremarkable. IMPRESSION: Right upper extremity PICC terminates over the distal SVC. Electronically Signed   By: Darliss Cheney M.D.   On: 10/19/2022 18:10   [place] EKG SITE RITE  Result Date: 10/19/2022 If Site Rite image not attached, placement could not be confirmed due to current cardiac rhythm.  CT ANGIO GI BLEED  Result Date: 10/17/2022 CLINICAL DATA:  Chronic mesenteric ischemia, metastatic neuroendocrine tumor * Tracking Code: BO * EXAM: CTA ABDOMEN AND PELVIS WITHOUT AND WITH CONTRAST TECHNIQUE: Multidetector CT imaging of the abdomen and pelvis was performed using the standard protocol during bolus  administration of intravenous contrast. Multiplanar reconstructed images and MIPs were obtained and reviewed to evaluate the vascular anatomy. RADIATION DOSE REDUCTION: This exam was performed according to the departmental dose-optimization program which includes automated exposure control, adjustment of the mA and/or kV according to patient size and/or use of iterative reconstruction technique. CONTRAST:  OMNIPAQUE IOHEXOL 350 MG/ML SOLN COMPARISON:  04/17/2022 FINDINGS: VASCULAR Normal contour and caliber of the abdominal aorta. No evidence of aneurysm, dissection, or other acute aortic pathology. Mild to moderate mixed calcific atherosclerosis. Small accessory right renal artery with a solitary left renal artery and otherwise standard branching pattern of the  abdominal aorta. Scattered atherosclerosis of the branch vessel origins without significant stenosis. In particular, the superior mesenteric artery is widely patent through its first and second order branches. Review of the MIP images confirms the above findings. NON-VASCULAR Lower chest: No acute abnormality. Cardiomegaly. Moderate hernia with intrathoracic position of the gastric fundus. Numerous small nodules in the included bilateral lung bases, largest in the left lung base measuring 1.0 cm (series 6, image 22). Hepatobiliary: No focal liver abnormality is seen. Status post cholecystectomy. No biliary dilatation. Pancreas: Unremarkable. No pancreatic ductal dilatation or surrounding inflammatory changes. Spleen: Normal in size without significant abnormality. Adrenals/Urinary Tract: Adrenal glands are unremarkable. Enhancing mass of the posterior midportion of the left kidney measuring 1.9 x 1.8 cm (series 4, image 79). Bladder is unremarkable. Stomach/Bowel: Proximal stomach is normal. Severe wall thickening and inflammatory fat stranding about the pylorus, duodenum, and proximal jejunum (series 4, image 69, 96). Diverticulum of the descending duodenum. Appendix appears normal. The colon is fluid-filled to the rectum. Sigmoid diverticula without evidence of diverticulitis. Lymphatic: No enlarged abdominal or pelvic lymph nodes. Reproductive: No mass or other significant abnormality. Other: No abdominal wall hernia or abnormality. No ascites. Musculoskeletal: No acute or significant osseous findings. IMPRESSION: 1. Normal contour and caliber of the abdominal aorta. No evidence of aneurysm, dissection, or other acute aortic pathology. 2. Scattered atherosclerosis of the branch vessel origins without significant stenosis. In particular, the superior mesenteric artery is widely patent through its first and second order branches. 3. Severe wall thickening and inflammatory fat stranding about the pylorus, duodenal,  and proximal jejunum, consistent with nonspecific infectious or inflammatory enteritis. 4. The colon is fluid-filled to the rectum, consistent with diarrheal illness. 5. Enhancing mass of the posterior midportion of the left kidney measuring 1.9 x 1.8 cm, consistent with renal cell carcinoma. No evidence of renal vein invasion, lymphadenopathy, or abdominal metastatic disease. 6. Numerous small nodules in the included bilateral lung bases, consistent with patient's known metastatic neuroendocrine tumor. Aortic Atherosclerosis (ICD10-I70.0). Electronically Signed   By: Jearld Lesch M.D.   On: 10/17/2022 19:44      Subjective:  No significant events overnight as discussed with staff, patient denies any complaints today, eager to go home today, daughter at bedside report patient had a good night sleep Discharge Exam: Vitals:   11/13/22 0310 11/13/22 0818  BP: 125/76 (!) 187/84  Pulse: 75 75  Resp: 19 16  Temp: 98.1 F (36.7 C) 99.5 F (37.5 C)  SpO2:     Vitals:   11/12/22 1000 11/12/22 2000 11/13/22 0310 11/13/22 0818  BP: (!) 163/78 (!) 156/67 125/76 (!) 187/84  Pulse: (!) 53 65 75 75  Resp: (!) 23 20 19 16   Temp: 97.9 F (36.6 C) 98 F (36.7 C) 98.1 F (36.7 C) 99.5 F (37.5 C)  TempSrc: Oral Oral Oral Oral  SpO2: 95%  Weight:      Height:        General: Pt is alert, awake, not in acute distress, mentation much improved, she is awake and alert and appropriate this morning. Cardiovascular: RRR, S1/S2 +, no rubs, no gallops Respiratory: CTA bilaterally, no wheezing, no rhonchi Abdominal: Soft, NT, ND, bowel sounds + Extremities: no edema, no cyanosis    The results of significant diagnostics from this hospitalization (including imaging, microbiology, ancillary and laboratory) are listed below for reference.     Microbiology: No results found for this or any previous visit (from the past 240 hour(s)).   Labs: BNP (last 3 results) No results for input(s): "BNP" in  the last 8760 hours. Basic Metabolic Panel: Recent Labs  Lab 11/08/22 0811 11/09/22 0707 11/10/22 0359 11/11/22 0312 11/12/22 0403 11/13/22 0305  NA 134* 137 140 135 136 137  K 3.9 3.5 2.9* 3.5 3.1* 3.7  CL 94* 101 106 102 101 105  CO2 24 25 25 23 25 23   GLUCOSE 354* 261* 195* 204* 185* 175*  BUN 15 29* 21 7* <5* <5*  CREATININE 1.00 1.18* 1.01* 0.87 0.95 0.99  CALCIUM 9.7 8.3* 8.1* 8.3* 8.0* 8.3*  MG 1.7  --  1.7  --   --   --   PHOS  --   --  2.9  --   --   --    Liver Function Tests: Recent Labs  Lab 11/07/22 1032 11/08/22 0811 11/09/22 0707  AST 12* 15 8*  ALT 13 12 14   ALKPHOS 101 84 59  BILITOT 0.5 0.6 0.4  PROT 7.8 7.4 5.1*  ALBUMIN 3.8 3.7 2.2*   Recent Labs  Lab 11/08/22 0811  LIPASE 18   Recent Labs  Lab 11/08/22 0933  AMMONIA 17   CBC: Recent Labs  Lab 11/07/22 1032 11/08/22 0811 11/09/22 1715 11/10/22 0359 11/10/22 1824 11/11/22 0312 11/12/22 0403 11/13/22 0305  WBC 10.0   < > 10.3 8.2  --  9.6 7.7 7.0  NEUTROABS 8.1*  --   --   --   --   --   --   --   HGB 11.0*   < > 7.7* 8.4* 9.0* 9.3* 9.2* 9.4*  HCT 34.6*   < > 24.5* 27.2* 28.5* 28.7* 28.6* 29.0*  MCV 95.8   < > 97.2 95.4  --  95.0 96.0 95.4  PLT 564*   < > 382 301  --  307 266 252   < > = values in this interval not displayed.   Cardiac Enzymes: No results for input(s): "CKTOTAL", "CKMB", "CKMBINDEX", "TROPONINI" in the last 168 hours. BNP: Invalid input(s): "POCBNP" CBG: Recent Labs  Lab 11/12/22 0834 11/12/22 1241 11/12/22 1723 11/12/22 2119 11/13/22 0816  GLUCAP 201* 149* 176* 205* 183*   D-Dimer No results for input(s): "DDIMER" in the last 72 hours. Hgb A1c No results for input(s): "HGBA1C" in the last 72 hours. Lipid Profile No results for input(s): "CHOL", "HDL", "LDLCALC", "TRIG", "CHOLHDL", "LDLDIRECT" in the last 72 hours. Thyroid function studies No results for input(s): "TSH", "T4TOTAL", "T3FREE", "THYROIDAB" in the last 72 hours.  Invalid input(s):  "FREET3" Anemia work up No results for input(s): "VITAMINB12", "FOLATE", "FERRITIN", "TIBC", "IRON", "RETICCTPCT" in the last 72 hours. Urinalysis    Component Value Date/Time   COLORURINE YELLOW 11/10/2022 1731   APPEARANCEUR CLEAR 11/10/2022 1731   LABSPEC 1.020 11/10/2022 1731   PHURINE 6.0 11/10/2022 1731   GLUCOSEU 50 (A) 11/10/2022 1731   HGBUR LARGE (  A) 11/10/2022 1731   BILIRUBINUR NEGATIVE 11/10/2022 1731   KETONESUR 20 (A) 11/10/2022 1731   PROTEINUR 30 (A) 11/10/2022 1731   NITRITE NEGATIVE 11/10/2022 1731   LEUKOCYTESUR TRACE (A) 11/10/2022 1731   Sepsis Labs Recent Labs  Lab 11/10/22 0359 11/11/22 0312 11/12/22 0403 11/13/22 0305  WBC 8.2 9.6 7.7 7.0   Microbiology No results found for this or any previous visit (from the past 240 hour(s)).   Time coordinating discharge: Over 30 minutes  SIGNED:   Huey Bienenstock, MD  [facility] 11/13/2022, 9:51 AM Pager   If 7PM-7AM, please contact night-coverage [email or web address]

## 2022-11-13 NOTE — Discharge Instructions (Addendum)
Follow with Primary MD Timberlake, Kathryn S, MD in 7 days   Get CBC, CMP,  checked  by Primary MD next visit.    Activity: As tolerated with Full fall precautions use walker/cane & assistance as needed   Disposition Home    Diet: Heart Healthy    On your next visit with your primary care physician please Get Medicines reviewed and adjusted.   Please request your Prim.MD to go over all Hospital Tests and Procedure/Radiological results at the follow up, please get all Hospital records sent to your Prim MD by signing hospital release before you go home.   If you experience worsening of your admission symptoms, develop shortness of breath, life threatening emergency, suicidal or homicidal thoughts you must seek medical attention immediately by calling 911 or calling your MD immediately  if symptoms less severe.  You Must read complete instructions/literature along with all the possible adverse reactions/side effects for all the Medicines you take and that have been prescribed to you. Take any new Medicines after you have completely understood and accpet all the possible adverse reactions/side effects.   Do not drive, operating heavy machinery, perform activities at heights, swimming or participation in water activities or provide baby sitting services if your were admitted for syncope or siezures until you have seen by Primary MD or a Neurologist and advised to do so again.  Do not drive when taking Pain medications.    Do not take more than prescribed Pain, Sleep and Anxiety Medications  Special Instructions: If you have smoked or chewed Tobacco  in the last 2 yrs please stop smoking, stop any regular Alcohol  and or any Recreational drug use.  Wear Seat belts while driving.   Please note  You were cared for by a hospitalist during your hospital stay. If you have any questions about your discharge medications or the care you received while you were in the hospital after you are  discharged, you can call the unit and asked to speak with the hospitalist on call if the hospitalist that took care of you is not available. Once you are discharged, your primary care physician will handle any further medical issues. Please note that NO REFILLS for any discharge medications will be authorized once you are discharged, as it is imperative that you return to your primary care physician (or establish a relationship with a primary care physician if you do not have one) for your aftercare needs so that they can reassess your need for medications and monitor your lab values.  

## 2022-11-14 NOTE — Telephone Encounter (Signed)
Patient Advocate Encounter  Prior Authorization for Pantoprazole Sodium 40MG  dr tablets  has been approved.    PA# [id number] Proofreader Electronic PA Form  Effective dates: 11/14/2022 through 11/12/2023      Roland Earl, CPhT Pharmacy Patient Advocate Specialist [facility] Patient Advocate Team Direct Number: [phone]  Fax: [phone]

## 2022-11-16 DIAGNOSIS — E876 Hypokalemia: Secondary | ICD-10-CM | POA: Diagnosis not present

## 2022-11-16 DIAGNOSIS — E871 Hypo-osmolality and hyponatremia: Secondary | ICD-10-CM | POA: Diagnosis not present

## 2022-11-16 DIAGNOSIS — I110 Hypertensive heart disease with heart failure: Secondary | ICD-10-CM | POA: Diagnosis not present

## 2022-11-16 DIAGNOSIS — N390 Urinary tract infection, site not specified: Secondary | ICD-10-CM | POA: Diagnosis not present

## 2022-11-16 DIAGNOSIS — F03A3 Unspecified dementia, mild, with mood disturbance: Secondary | ICD-10-CM | POA: Diagnosis not present

## 2022-11-16 DIAGNOSIS — F32A Depression, unspecified: Secondary | ICD-10-CM | POA: Diagnosis not present

## 2022-11-19 DIAGNOSIS — F32A Depression, unspecified: Secondary | ICD-10-CM | POA: Diagnosis not present

## 2022-11-19 DIAGNOSIS — I110 Hypertensive heart disease with heart failure: Secondary | ICD-10-CM | POA: Diagnosis not present

## 2022-11-19 DIAGNOSIS — F03A3 Unspecified dementia, mild, with mood disturbance: Secondary | ICD-10-CM | POA: Diagnosis not present

## 2022-11-19 DIAGNOSIS — N390 Urinary tract infection, site not specified: Secondary | ICD-10-CM | POA: Diagnosis not present

## 2022-11-19 DIAGNOSIS — E871 Hypo-osmolality and hyponatremia: Secondary | ICD-10-CM | POA: Diagnosis not present

## 2022-11-19 DIAGNOSIS — F0390 Unspecified dementia without behavioral disturbance: Secondary | ICD-10-CM | POA: Diagnosis not present

## 2022-11-19 DIAGNOSIS — E876 Hypokalemia: Secondary | ICD-10-CM | POA: Diagnosis not present

## 2022-11-20 DIAGNOSIS — E871 Hypo-osmolality and hyponatremia: Secondary | ICD-10-CM | POA: Diagnosis not present

## 2022-11-20 DIAGNOSIS — I110 Hypertensive heart disease with heart failure: Secondary | ICD-10-CM | POA: Diagnosis not present

## 2022-11-20 DIAGNOSIS — N390 Urinary tract infection, site not specified: Secondary | ICD-10-CM | POA: Diagnosis not present

## 2022-11-20 DIAGNOSIS — F03A3 Unspecified dementia, mild, with mood disturbance: Secondary | ICD-10-CM | POA: Diagnosis not present

## 2022-11-20 DIAGNOSIS — F32A Depression, unspecified: Secondary | ICD-10-CM | POA: Diagnosis not present

## 2022-11-20 DIAGNOSIS — E876 Hypokalemia: Secondary | ICD-10-CM | POA: Diagnosis not present

## 2022-11-21 DIAGNOSIS — K250 Acute gastric ulcer with hemorrhage: Secondary | ICD-10-CM | POA: Diagnosis not present

## 2022-11-21 DIAGNOSIS — H401112 Primary open-angle glaucoma, right eye, moderate stage: Secondary | ICD-10-CM | POA: Diagnosis not present

## 2022-11-21 DIAGNOSIS — E119 Type 2 diabetes mellitus without complications: Secondary | ICD-10-CM | POA: Diagnosis not present

## 2022-11-21 DIAGNOSIS — D735 Infarction of spleen: Secondary | ICD-10-CM | POA: Diagnosis not present

## 2022-11-21 DIAGNOSIS — F0390 Unspecified dementia without behavioral disturbance: Secondary | ICD-10-CM | POA: Diagnosis not present

## 2022-11-22 DIAGNOSIS — I110 Hypertensive heart disease with heart failure: Secondary | ICD-10-CM | POA: Diagnosis not present

## 2022-11-22 DIAGNOSIS — D735 Infarction of spleen: Secondary | ICD-10-CM | POA: Diagnosis not present

## 2022-11-22 DIAGNOSIS — K2981 Duodenitis with bleeding: Secondary | ICD-10-CM | POA: Diagnosis not present

## 2022-11-22 DIAGNOSIS — E785 Hyperlipidemia, unspecified: Secondary | ICD-10-CM | POA: Diagnosis not present

## 2022-11-22 DIAGNOSIS — K449 Diaphragmatic hernia without obstruction or gangrene: Secondary | ICD-10-CM | POA: Diagnosis not present

## 2022-11-22 DIAGNOSIS — F32A Depression, unspecified: Secondary | ICD-10-CM | POA: Diagnosis not present

## 2022-11-22 DIAGNOSIS — Z87440 Personal history of urinary (tract) infections: Secondary | ICD-10-CM | POA: Diagnosis not present

## 2022-11-22 DIAGNOSIS — Z87442 Personal history of urinary calculi: Secondary | ICD-10-CM | POA: Diagnosis not present

## 2022-11-22 DIAGNOSIS — K219 Gastro-esophageal reflux disease without esophagitis: Secondary | ICD-10-CM | POA: Diagnosis not present

## 2022-11-22 DIAGNOSIS — I441 Atrioventricular block, second degree: Secondary | ICD-10-CM | POA: Diagnosis not present

## 2022-11-22 DIAGNOSIS — R918 Other nonspecific abnormal finding of lung field: Secondary | ICD-10-CM | POA: Diagnosis not present

## 2022-11-22 DIAGNOSIS — K264 Chronic or unspecified duodenal ulcer with hemorrhage: Secondary | ICD-10-CM | POA: Diagnosis not present

## 2022-11-22 DIAGNOSIS — D630 Anemia in neoplastic disease: Secondary | ICD-10-CM | POA: Diagnosis not present

## 2022-11-22 DIAGNOSIS — D3A8 Other benign neuroendocrine tumors: Secondary | ICD-10-CM | POA: Diagnosis not present

## 2022-11-22 DIAGNOSIS — D696 Thrombocytopenia, unspecified: Secondary | ICD-10-CM | POA: Diagnosis not present

## 2022-11-22 DIAGNOSIS — D62 Acute posthemorrhagic anemia: Secondary | ICD-10-CM | POA: Diagnosis not present

## 2022-11-22 DIAGNOSIS — G9341 Metabolic encephalopathy: Secondary | ICD-10-CM | POA: Diagnosis not present

## 2022-11-22 DIAGNOSIS — I700 Atherosclerosis of aorta: Secondary | ICD-10-CM | POA: Diagnosis not present

## 2022-11-22 DIAGNOSIS — Z9181 History of falling: Secondary | ICD-10-CM | POA: Diagnosis not present

## 2022-11-22 DIAGNOSIS — I509 Heart failure, unspecified: Secondary | ICD-10-CM | POA: Diagnosis not present

## 2022-11-22 DIAGNOSIS — E119 Type 2 diabetes mellitus without complications: Secondary | ICD-10-CM | POA: Diagnosis not present

## 2022-11-22 DIAGNOSIS — Z7984 Long term (current) use of oral hypoglycemic drugs: Secondary | ICD-10-CM | POA: Diagnosis not present

## 2022-11-22 DIAGNOSIS — F03A3 Unspecified dementia, mild, with mood disturbance: Secondary | ICD-10-CM | POA: Diagnosis not present

## 2022-11-22 DIAGNOSIS — N2889 Other specified disorders of kidney and ureter: Secondary | ICD-10-CM | POA: Diagnosis not present

## 2022-11-27 DIAGNOSIS — F03A3 Unspecified dementia, mild, with mood disturbance: Secondary | ICD-10-CM | POA: Diagnosis not present

## 2022-11-27 DIAGNOSIS — K2981 Duodenitis with bleeding: Secondary | ICD-10-CM | POA: Diagnosis not present

## 2022-11-27 DIAGNOSIS — K264 Chronic or unspecified duodenal ulcer with hemorrhage: Secondary | ICD-10-CM | POA: Diagnosis not present

## 2022-11-27 DIAGNOSIS — F32A Depression, unspecified: Secondary | ICD-10-CM | POA: Diagnosis not present

## 2022-11-27 DIAGNOSIS — D735 Infarction of spleen: Secondary | ICD-10-CM | POA: Diagnosis not present

## 2022-11-27 DIAGNOSIS — I110 Hypertensive heart disease with heart failure: Secondary | ICD-10-CM | POA: Diagnosis not present

## 2022-11-29 DIAGNOSIS — D735 Infarction of spleen: Secondary | ICD-10-CM | POA: Diagnosis not present

## 2022-11-29 DIAGNOSIS — F32A Depression, unspecified: Secondary | ICD-10-CM | POA: Diagnosis not present

## 2022-11-29 DIAGNOSIS — I110 Hypertensive heart disease with heart failure: Secondary | ICD-10-CM | POA: Diagnosis not present

## 2022-11-29 DIAGNOSIS — K264 Chronic or unspecified duodenal ulcer with hemorrhage: Secondary | ICD-10-CM | POA: Diagnosis not present

## 2022-11-29 DIAGNOSIS — F03A3 Unspecified dementia, mild, with mood disturbance: Secondary | ICD-10-CM | POA: Diagnosis not present

## 2022-11-29 DIAGNOSIS — K2981 Duodenitis with bleeding: Secondary | ICD-10-CM | POA: Diagnosis not present

## 2022-12-03 DIAGNOSIS — F03A3 Unspecified dementia, mild, with mood disturbance: Secondary | ICD-10-CM | POA: Diagnosis not present

## 2022-12-03 DIAGNOSIS — D735 Infarction of spleen: Secondary | ICD-10-CM | POA: Diagnosis not present

## 2022-12-03 DIAGNOSIS — K264 Chronic or unspecified duodenal ulcer with hemorrhage: Secondary | ICD-10-CM | POA: Diagnosis not present

## 2022-12-03 DIAGNOSIS — K2981 Duodenitis with bleeding: Secondary | ICD-10-CM | POA: Diagnosis not present

## 2022-12-03 DIAGNOSIS — F32A Depression, unspecified: Secondary | ICD-10-CM | POA: Diagnosis not present

## 2022-12-03 DIAGNOSIS — I110 Hypertensive heart disease with heart failure: Secondary | ICD-10-CM | POA: Diagnosis not present

## 2022-12-04 DIAGNOSIS — K2981 Duodenitis with bleeding: Secondary | ICD-10-CM | POA: Diagnosis not present

## 2022-12-04 DIAGNOSIS — I110 Hypertensive heart disease with heart failure: Secondary | ICD-10-CM | POA: Diagnosis not present

## 2022-12-04 DIAGNOSIS — F03A3 Unspecified dementia, mild, with mood disturbance: Secondary | ICD-10-CM | POA: Diagnosis not present

## 2022-12-04 DIAGNOSIS — F32A Depression, unspecified: Secondary | ICD-10-CM | POA: Diagnosis not present

## 2022-12-04 DIAGNOSIS — D735 Infarction of spleen: Secondary | ICD-10-CM | POA: Diagnosis not present

## 2022-12-04 DIAGNOSIS — K264 Chronic or unspecified duodenal ulcer with hemorrhage: Secondary | ICD-10-CM | POA: Diagnosis not present

## 2022-12-10 DIAGNOSIS — K2981 Duodenitis with bleeding: Secondary | ICD-10-CM | POA: Diagnosis not present

## 2022-12-10 DIAGNOSIS — K264 Chronic or unspecified duodenal ulcer with hemorrhage: Secondary | ICD-10-CM | POA: Diagnosis not present

## 2022-12-10 DIAGNOSIS — I110 Hypertensive heart disease with heart failure: Secondary | ICD-10-CM | POA: Diagnosis not present

## 2022-12-10 DIAGNOSIS — F32A Depression, unspecified: Secondary | ICD-10-CM | POA: Diagnosis not present

## 2022-12-10 DIAGNOSIS — D735 Infarction of spleen: Secondary | ICD-10-CM | POA: Diagnosis not present

## 2022-12-10 DIAGNOSIS — F03A3 Unspecified dementia, mild, with mood disturbance: Secondary | ICD-10-CM | POA: Diagnosis not present

## 2022-12-10 NOTE — Progress Notes (Signed)
Cardiology Office Note:   Date:  12/19/2022  NAME:  [name]    MRN: [id number] DOB:  [birth year]   PCP:  Shon Hale, MD  Cardiologist:  None  Electrophysiologist:  None   Referring MD: Shon Hale, *   Chief Complaint  Patient presents with   Follow-up         History of Present Illness:   Michaela Santana is a 77 y.o. female with a hx of DM, 1AVB, HTN, PACs, PVCs who presents for follow-up.  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  This is being followed by oncology.  Only go to be treated if she has symptoms.  Also diagnosed with recent GI bleed and splenic infarct.  Suspect this could be malignancy related.  Not on anticoagulation.  She was evaluated in the hospital in May for bradycardia.  Had Wenckebach as well as first-degree AV block.  No treatment was recommended.  She actually tells me she feels good.  She has swelling in her legs.  We discussed a Lasix as needed.  She reports no chest pains or trouble breathing.  Thyroid studies are within limits.  No signs of heart failure.  No symptoms of angina.  Overall stable and doing well.  Problem List 1. DM -A1c 6.8 2. HTN 3. HLD -T chol 131, HDL 50, LDL 58, TG 137 4. 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 PVCs  -6.2 8. Stage IV Carcinoid tumor  9. Dementia  10. GI bleed  -duodenal ulcer   Past Medical History: Past Medical History:  Diagnosis Date   Allergic rhinitis    Anemia    Arthritis    "probably in hands" (07/17/2016)   CKD (chronic kidney disease), stage III (HCC)    Depression    Esophageal reflux    Family history of adverse reaction to anesthesia    "her father had some issues when he had his gallbladder taken out when he was in his late 50s"   GERD (gastroesophageal reflux disease)    Glaucoma    open angle, bilateral   High cholesterol    History of kidney stones    Hypercholesterolemia    Hypertension    Kidney cysts     Lumbago with sciatica, right side    Sepsis (HCC) 07/2016   d/t flu   Type II diabetes mellitus (HCC)    Vitamin D deficiency     Past Surgical History: Past Surgical History:  Procedure Laterality Date   BREAST BIOPSY  1990   "? side; it wasn't anything"   BRONCHIAL BIOPSY  08/01/2021   Procedure: BRONCHIAL BIOPSIES;  Surgeon: Josephine Igo, DO;  Location: [facility];  Service: Pulmonary;;   BRONCHIAL NEEDLE ASPIRATION BIOPSY  08/01/2021   Procedure: BRONCHIAL NEEDLE ASPIRATION BIOPSIES;  Surgeon: Josephine Igo, DO;  Location: [facility];  Service: Pulmonary;;   CATARACT EXTRACTION, BILATERAL  2021   CHOLECYSTECTOMY OPEN  1971   COLONOSCOPY     2001,2011,2023   ESOPHAGOGASTRODUODENOSCOPY (EGD) WITH PROPOFOL N/A 11/10/2022   Procedure: ESOPHAGOGASTRODUODENOSCOPY (EGD) WITH PROPOFOL;  Surgeon: Willis Modena, MD;  Location: [facility];  Service: Gastroenterology;  Laterality: N/A;   TUBAL LIGATION  1978   VIDEO BRONCHOSCOPY WITH RADIAL ENDOBRONCHIAL ULTRASOUND  08/01/2021   Procedure: RADIAL ENDOBRONCHIAL ULTRASOUND;  Surgeon: Josephine Igo, DO;  Location: [facility];  Service: Pulmonary;;    Current Medications: Current Meds  Medication Sig  acetaminophen (TYLENOL) 650 MG CR tablet Take 650 mg by mouth every 8 (eight) hours as needed for pain.   albuterol (VENTOLIN HFA) 108 (90 Base) MCG/ACT inhaler Inhale into the lungs every 6 (six) hours as needed for wheezing or shortness of breath.   amLODipine (NORVASC) 5 MG tablet Take 5 mg by mouth daily.   Artificial Tear Solution (SYSTANE CONTACTS OP) Apply 0.4 drops to eye as needed.   Blood Glucose Monitoring Suppl (FREESTYLE LITE) DEVI USE UTD   calcium carbonate (TUMS - DOSED IN MG ELEMENTAL CALCIUM) 500 MG chewable tablet Chew 1 tablet by mouth daily.   DULoxetine HCl 40 MG CPEP Take 1 capsule by mouth daily.   ferrous sulfate 325 (65 FE) MG EC tablet Take 325 mg by mouth daily.   FREESTYLE LITE test strip USE  TO CHECK FASTING GLUCOSE IN THE MORNING AND 2 HOURS AFTER DINNER OR LUNCH   furosemide (LASIX) 20 MG tablet Take 1 tablet (20 mg total) by mouth daily as needed (swelling).   gabapentin (NEURONTIN) 300 MG capsule Take 300 mg by mouth every evening.   glipiZIDE (GLUCOTROL XL) 5 MG 24 hr tablet Take 5 mg by mouth daily with breakfast.   Lancets (FREESTYLE) lancets    latanoprost (XALATAN) 0.005 % ophthalmic solution Place 1 drop into the right eye at bedtime.   loratadine (CLARITIN) 10 MG tablet Take 10 mg by mouth daily as needed for allergies.   LORazepam (ATIVAN) 0.5 MG tablet Take 0.5 mg by mouth every 8 (eight) hours.   losartan (COZAAR) 100 MG tablet Take 0.5 tablets (50 mg total) by mouth daily.   magnesium oxide (MAG-OX) 400 (240 Mg) MG tablet Take 800 mg by mouth 2 (two) times daily.   ondansetron (ZOFRAN) 8 MG tablet Take 8 mg by mouth every 8 (eight) hours as needed for nausea/vomiting.   oxymetazoline (AFRIN) 0.05 % nasal spray Place 1 spray into both nostrils 2 (two) times daily.   pantoprazole (PROTONIX) 40 MG tablet Take 1 tablet (40 mg total) by mouth 2 (two) times daily before a meal.   QUEtiapine (SEROQUEL) 25 MG tablet Take 12.5 mg by mouth every other day.   vitamin B-12 (CYANOCOBALAMIN) 1000 MCG tablet Take 1 tablet (1,000 mcg total) by mouth daily.   [DISCONTINUED] DULoxetine (CYMBALTA) 30 MG capsule Take 30 mg by mouth daily.     Allergies:    Codeine and Alprazolam   Social History: Social History   Socioeconomic History   Marital status: Widowed    Spouse name: Not on file   Number of children: 2   Years of education: Not on file   Highest education level: Some college, no degree  Occupational History   Occupation: Geophysicist/field seismologist  Tobacco Use   Smoking status: Never   Smokeless tobacco: Never  Vaping Use   Vaping Use: Never used  Substance and Sexual Activity   Alcohol use: Never   Drug use: Never   Sexual activity: Not on file  Other Topics Concern   Not  on file  Social History Narrative   02/08/20 Lives alone   caffeine soda 1-2 daily   Social Determinants of Health   Financial Resource Strain: Not on file  Food Insecurity: No Food Insecurity (11/08/2022)   Hunger Vital Sign    Worried About Running Out of Food in the Last Year: Never true    Ran Out of Food in the Last Year: Never true  Transportation Needs: No Transportation Needs (11/08/2022)  PRAPARE - Administrator, Civil Service (Medical): No    Lack of Transportation (Non-Medical): No  Physical Activity: Not on file  Stress: Not on file  Social Connections: Not on file     Family History: The patient's family history includes CVA in her father; Dementia in her mother; Diabetes in her mother and sister; Heart disease in her paternal grandfather; Hypertension in her father and sister; Stroke in her father. There is no history of Breast cancer.  ROS:   All other ROS reviewed and negative. Pertinent positives noted in the HPI.     EKGs/Labs/Other Studies Reviewed:   The following studies were personally reviewed by me today:  EKG:  EKG is not ordered today.        Recent Labs: 11/09/2022: ALT 14 11/10/2022: Magnesium 1.7 11/13/2022: BUN <5; Creatinine, Ser 0.99; Hemoglobin 9.4; Platelets 252; Potassium 3.7; Sodium 137   Recent Lipid Panel No results found for: "CHOL", "TRIG", "HDL", "CHOLHDL", "VLDL", "LDLCALC", "LDLDIRECT"  Physical Exam:   VS:  BP 126/88   Pulse 63   Ht 5' (1.524 m)   Wt 178 lb 3.2 oz (80.8 kg)   SpO2 97%   BMI 34.80 kg/m    Wt Readings from Last 3 Encounters:  12/19/22 178 lb 3.2 oz (80.8 kg)  11/08/22 179 lb (81.2 kg)  10/16/22 184 lb 11.9 oz (83.8 kg)    General: Well nourished, well developed, in no acute distress Head: Atraumatic, normal size  Eyes: PEERLA, EOMI  Neck: Supple, no JVD Endocrine: No thryomegaly Cardiac: Normal S1, S2; RRR; no murmurs, rubs, or gallops Lungs: Clear to auscultation bilaterally, no wheezing,  rhonchi or rales  Abd: Soft, nontender, no hepatomegaly  Ext: No edema, pulses 2+ Musculoskeletal: No deformities, BUE and BLE strength normal and equal Skin: Warm and dry, no rashes   Neuro: Alert and oriented to person, place, time, and situation, CNII-XII grossly intact, no focal deficits  Psych: Normal mood and affect   ASSESSMENT:   Michaela Santana is a 77 y.o. female who presents for the following: 1. Atrioventricular block, Mobitz type 1, Wenckebach   2. First degree AV block   3. PAC (premature atrial contraction)   4. PVC (premature ventricular contraction)   5. Edema, unspecified type     PLAN:   1. Atrioventricular block, Mobitz type 1, Wenckebach 2. First degree AV block -Stable.  No symptoms.  Avoiding AV nodal agents.  No high-grade conduction disease.  No symptoms.  Recently observed in the hospital.  Her echo and stress test were largely normal.  No further workup needed.  We will just monitor this closely.  She was advised of red flag symptoms to look out for.  3. PAC (premature atrial contraction) 4. PVC (premature ventricular contraction) -Minimal burden on monitor.  No symptoms.  We are following this conservatively.  Normal stress test and normal echo.  5. Edema, unspecified type -Lasix 20 mg daily as needed.  Suspect this is just due to deconditioning and recent hospitalizations.  Suspect it is also an element of venous insufficiency.    Disposition: Return in about 1 year (around 12/19/2023).  Medication Adjustments/Labs and Tests Ordered: Current medicines are reviewed at length with the patient today.  Concerns regarding medicines are outlined above.  No orders of the defined types were placed in this encounter.  Meds ordered this encounter  Medications   furosemide (LASIX) 20 MG tablet    Sig: Take 1 tablet (20 mg total)  by mouth daily as needed (swelling).    Dispense:  30 tablet    Refill:  3   Patient Instructions  Medication Instructions:  Dr.  Flora Lipps has prescribed furosemide (lasix) 20mg  -- take daily as needed for swelling  *If you need a refill on your cardiac medications before your next appointment, please call your pharmacy*    Follow-Up: At [facility], you and your health needs are our priority.  As part of our continuing mission to provide you with exceptional heart care, we have created designated Provider Care Teams.  These Care Teams include your primary Cardiologist (physician) and Advanced Practice Providers (APPs -  Physician Assistants and Nurse Practitioners) who all work together to provide you with the care you need, when you need it.  We recommend signing up for the patient portal called "MyChart".  Sign up information is provided on this After Visit Summary.  MyChart is used to connect with patients for Virtual Visits (Telemedicine).  Patients are able to view lab/test results, encounter notes, upcoming appointments, etc.  Non-urgent messages can be sent to your provider as well.   To learn more about what you can do with MyChart, go to [email or web address].    Your next appointment:    12 months with Dr. Flora Lipps   Time Spent with Patient: I have spent a total of 25 minutes with patient reviewing hospital notes, telemetry, EKGs, labs and examining the patient as well as establishing an assessment and plan that was discussed with the patient.  > 50% of time was spent in direct patient care.  Signed, Lenna Gilford. Flora Lipps, MD, [facility]  [facility]  [address], [address] [place], [place] [address] [phone]  12/19/2022 10:44 AM

## 2022-12-11 DIAGNOSIS — N390 Urinary tract infection, site not specified: Secondary | ICD-10-CM | POA: Diagnosis not present

## 2022-12-14 DIAGNOSIS — K264 Chronic or unspecified duodenal ulcer with hemorrhage: Secondary | ICD-10-CM | POA: Diagnosis not present

## 2022-12-14 DIAGNOSIS — I110 Hypertensive heart disease with heart failure: Secondary | ICD-10-CM | POA: Diagnosis not present

## 2022-12-14 DIAGNOSIS — D735 Infarction of spleen: Secondary | ICD-10-CM | POA: Diagnosis not present

## 2022-12-14 DIAGNOSIS — K2981 Duodenitis with bleeding: Secondary | ICD-10-CM | POA: Diagnosis not present

## 2022-12-14 DIAGNOSIS — F03A3 Unspecified dementia, mild, with mood disturbance: Secondary | ICD-10-CM | POA: Diagnosis not present

## 2022-12-14 DIAGNOSIS — F32A Depression, unspecified: Secondary | ICD-10-CM | POA: Diagnosis not present

## 2022-12-17 ENCOUNTER — Other Ambulatory Visit: Payer: Self-pay | Admitting: Family Medicine

## 2022-12-17 DIAGNOSIS — Z1231 Encounter for screening mammogram for malignant neoplasm of breast: Secondary | ICD-10-CM

## 2022-12-18 DIAGNOSIS — F32A Depression, unspecified: Secondary | ICD-10-CM | POA: Diagnosis not present

## 2022-12-18 DIAGNOSIS — F03A3 Unspecified dementia, mild, with mood disturbance: Secondary | ICD-10-CM | POA: Diagnosis not present

## 2022-12-18 DIAGNOSIS — K264 Chronic or unspecified duodenal ulcer with hemorrhage: Secondary | ICD-10-CM | POA: Diagnosis not present

## 2022-12-18 DIAGNOSIS — D735 Infarction of spleen: Secondary | ICD-10-CM | POA: Diagnosis not present

## 2022-12-18 DIAGNOSIS — I110 Hypertensive heart disease with heart failure: Secondary | ICD-10-CM | POA: Diagnosis not present

## 2022-12-18 DIAGNOSIS — K2981 Duodenitis with bleeding: Secondary | ICD-10-CM | POA: Diagnosis not present

## 2022-12-19 ENCOUNTER — Encounter: Payer: Self-pay | Admitting: Cardiovascular Disease

## 2022-12-19 ENCOUNTER — Ambulatory Visit: Payer: Medicare Other | Attending: Cardiovascular Disease | Admitting: Cardiovascular Disease

## 2022-12-19 ENCOUNTER — Ambulatory Visit: Payer: Medicare Other | Admitting: Cardiovascular Disease

## 2022-12-19 VITALS — BP 126/88 | HR 63 | Ht 60.0 in | Wt 178.2 lb

## 2022-12-19 DIAGNOSIS — I493 Ventricular premature depolarization: Secondary | ICD-10-CM | POA: Diagnosis not present

## 2022-12-19 DIAGNOSIS — I491 Atrial premature depolarization: Secondary | ICD-10-CM | POA: Insufficient documentation

## 2022-12-19 DIAGNOSIS — I440 Atrioventricular block, first degree: Secondary | ICD-10-CM | POA: Insufficient documentation

## 2022-12-19 DIAGNOSIS — R609 Edema, unspecified: Secondary | ICD-10-CM | POA: Insufficient documentation

## 2022-12-19 DIAGNOSIS — I441 Atrioventricular block, second degree: Secondary | ICD-10-CM | POA: Insufficient documentation

## 2022-12-19 MED ORDER — FUROSEMIDE 20 MG PO TABS: 20.0000 mg | ORAL_TABLET | Freq: Every day | ORAL | 3 refills | Status: DC | PRN

## 2022-12-19 NOTE — Patient Instructions (Signed)
Medication Instructions:  Dr. Flora Lipps has prescribed furosemide (lasix) 20mg  -- take daily as needed for swelling  *If you need a refill on your cardiac medications before your next appointment, please call your pharmacy*    Follow-Up: At [facility], you and your health needs are our priority.  As part of our continuing mission to provide you with exceptional heart care, we have created designated Provider Care Teams.  These Care Teams include your primary Cardiologist (physician) and Advanced Practice Providers (APPs -  Physician Assistants and Nurse Practitioners) who all work together to provide you with the care you need, when you need it.  We recommend signing up for the patient portal called "MyChart".  Sign up information is provided on this After Visit Summary.  MyChart is used to connect with patients for Virtual Visits (Telemedicine).  Patients are able to view lab/test results, encounter notes, upcoming appointments, etc.  Non-urgent messages can be sent to your provider as well.   To learn more about what you can do with MyChart, go to [email or web address].    Your next appointment:    12 months with Dr. Flora Lipps

## 2022-12-20 DIAGNOSIS — I110 Hypertensive heart disease with heart failure: Secondary | ICD-10-CM | POA: Diagnosis not present

## 2022-12-20 DIAGNOSIS — K264 Chronic or unspecified duodenal ulcer with hemorrhage: Secondary | ICD-10-CM | POA: Diagnosis not present

## 2022-12-20 DIAGNOSIS — F03A3 Unspecified dementia, mild, with mood disturbance: Secondary | ICD-10-CM | POA: Diagnosis not present

## 2022-12-20 DIAGNOSIS — K2981 Duodenitis with bleeding: Secondary | ICD-10-CM | POA: Diagnosis not present

## 2022-12-20 DIAGNOSIS — D735 Infarction of spleen: Secondary | ICD-10-CM | POA: Diagnosis not present

## 2022-12-20 DIAGNOSIS — F32A Depression, unspecified: Secondary | ICD-10-CM | POA: Diagnosis not present

## 2023-01-08 DIAGNOSIS — E1165 Type 2 diabetes mellitus with hyperglycemia: Secondary | ICD-10-CM | POA: Diagnosis not present

## 2023-01-08 DIAGNOSIS — M5441 Lumbago with sciatica, right side: Secondary | ICD-10-CM | POA: Diagnosis not present

## 2023-01-08 DIAGNOSIS — F02B4 Dementia in other diseases classified elsewhere, moderate, with anxiety: Secondary | ICD-10-CM | POA: Diagnosis not present

## 2023-01-08 DIAGNOSIS — G300 Alzheimer's disease with early onset: Secondary | ICD-10-CM | POA: Diagnosis not present

## 2023-01-08 DIAGNOSIS — I10 Essential (primary) hypertension: Secondary | ICD-10-CM | POA: Diagnosis not present

## 2023-01-08 DIAGNOSIS — F322 Major depressive disorder, single episode, severe without psychotic features: Secondary | ICD-10-CM | POA: Diagnosis not present

## 2023-01-15 DIAGNOSIS — E1165 Type 2 diabetes mellitus with hyperglycemia: Secondary | ICD-10-CM | POA: Diagnosis not present

## 2023-01-15 DIAGNOSIS — N281 Cyst of kidney, acquired: Secondary | ICD-10-CM | POA: Diagnosis not present

## 2023-01-15 DIAGNOSIS — M5441 Lumbago with sciatica, right side: Secondary | ICD-10-CM | POA: Diagnosis not present

## 2023-01-15 DIAGNOSIS — G300 Alzheimer's disease with early onset: Secondary | ICD-10-CM | POA: Diagnosis not present

## 2023-01-15 DIAGNOSIS — I10 Essential (primary) hypertension: Secondary | ICD-10-CM | POA: Diagnosis not present

## 2023-01-15 DIAGNOSIS — F02B3 Dementia in other diseases classified elsewhere, moderate, with mood disturbance: Secondary | ICD-10-CM | POA: Diagnosis not present

## 2023-01-15 DIAGNOSIS — N2889 Other specified disorders of kidney and ureter: Secondary | ICD-10-CM | POA: Diagnosis not present

## 2023-01-15 DIAGNOSIS — F02A4 Dementia in other diseases classified elsewhere, mild, with anxiety: Secondary | ICD-10-CM | POA: Diagnosis not present

## 2023-01-15 DIAGNOSIS — D649 Anemia, unspecified: Secondary | ICD-10-CM | POA: Diagnosis not present

## 2023-01-15 DIAGNOSIS — Z7984 Long term (current) use of oral hypoglycemic drugs: Secondary | ICD-10-CM | POA: Diagnosis not present

## 2023-01-15 DIAGNOSIS — K219 Gastro-esophageal reflux disease without esophagitis: Secondary | ICD-10-CM | POA: Diagnosis not present

## 2023-01-15 DIAGNOSIS — E7800 Pure hypercholesterolemia, unspecified: Secondary | ICD-10-CM | POA: Diagnosis not present

## 2023-01-15 DIAGNOSIS — J309 Allergic rhinitis, unspecified: Secondary | ICD-10-CM | POA: Diagnosis not present

## 2023-01-15 DIAGNOSIS — R251 Tremor, unspecified: Secondary | ICD-10-CM | POA: Diagnosis not present

## 2023-01-15 DIAGNOSIS — Z9181 History of falling: Secondary | ICD-10-CM | POA: Diagnosis not present

## 2023-01-15 DIAGNOSIS — E559 Vitamin D deficiency, unspecified: Secondary | ICD-10-CM | POA: Diagnosis not present

## 2023-01-15 DIAGNOSIS — F322 Major depressive disorder, single episode, severe without psychotic features: Secondary | ICD-10-CM | POA: Diagnosis not present

## 2023-01-18 ENCOUNTER — Ambulatory Visit
Admission: RE | Admit: 2023-01-18 | Discharge: 2023-01-18 | Disposition: A | Discharge status: Home / Self Care | Payer: Medicare Other | Source: Ambulatory Visit | Attending: Family Medicine | Admitting: Family Medicine

## 2023-01-18 DIAGNOSIS — M5441 Lumbago with sciatica, right side: Secondary | ICD-10-CM | POA: Diagnosis not present

## 2023-01-18 DIAGNOSIS — Z1231 Encounter for screening mammogram for malignant neoplasm of breast: Secondary | ICD-10-CM | POA: Diagnosis not present

## 2023-01-18 DIAGNOSIS — E1165 Type 2 diabetes mellitus with hyperglycemia: Secondary | ICD-10-CM | POA: Diagnosis not present

## 2023-01-18 DIAGNOSIS — F02A4 Dementia in other diseases classified elsewhere, mild, with anxiety: Secondary | ICD-10-CM | POA: Diagnosis not present

## 2023-01-18 DIAGNOSIS — F322 Major depressive disorder, single episode, severe without psychotic features: Secondary | ICD-10-CM | POA: Diagnosis not present

## 2023-01-18 DIAGNOSIS — G300 Alzheimer's disease with early onset: Secondary | ICD-10-CM | POA: Diagnosis not present

## 2023-01-18 DIAGNOSIS — F02B3 Dementia in other diseases classified elsewhere, moderate, with mood disturbance: Secondary | ICD-10-CM | POA: Diagnosis not present

## 2023-01-22 DIAGNOSIS — G300 Alzheimer's disease with early onset: Secondary | ICD-10-CM | POA: Diagnosis not present

## 2023-01-22 DIAGNOSIS — F02B3 Dementia in other diseases classified elsewhere, moderate, with mood disturbance: Secondary | ICD-10-CM | POA: Diagnosis not present

## 2023-01-22 DIAGNOSIS — F322 Major depressive disorder, single episode, severe without psychotic features: Secondary | ICD-10-CM | POA: Diagnosis not present

## 2023-01-22 DIAGNOSIS — F02A4 Dementia in other diseases classified elsewhere, mild, with anxiety: Secondary | ICD-10-CM | POA: Diagnosis not present

## 2023-01-22 DIAGNOSIS — M5441 Lumbago with sciatica, right side: Secondary | ICD-10-CM | POA: Diagnosis not present

## 2023-01-22 DIAGNOSIS — E1165 Type 2 diabetes mellitus with hyperglycemia: Secondary | ICD-10-CM | POA: Diagnosis not present

## 2023-01-30 DIAGNOSIS — M5441 Lumbago with sciatica, right side: Secondary | ICD-10-CM | POA: Diagnosis not present

## 2023-01-30 DIAGNOSIS — F02A4 Dementia in other diseases classified elsewhere, mild, with anxiety: Secondary | ICD-10-CM | POA: Diagnosis not present

## 2023-01-30 DIAGNOSIS — F322 Major depressive disorder, single episode, severe without psychotic features: Secondary | ICD-10-CM | POA: Diagnosis not present

## 2023-01-30 DIAGNOSIS — E1165 Type 2 diabetes mellitus with hyperglycemia: Secondary | ICD-10-CM | POA: Diagnosis not present

## 2023-01-30 DIAGNOSIS — G300 Alzheimer's disease with early onset: Secondary | ICD-10-CM | POA: Diagnosis not present

## 2023-01-30 DIAGNOSIS — F02B3 Dementia in other diseases classified elsewhere, moderate, with mood disturbance: Secondary | ICD-10-CM | POA: Diagnosis not present

## 2023-01-31 DIAGNOSIS — D49512 Neoplasm of unspecified behavior of left kidney: Secondary | ICD-10-CM | POA: Diagnosis not present

## 2023-02-01 DIAGNOSIS — F02B3 Dementia in other diseases classified elsewhere, moderate, with mood disturbance: Secondary | ICD-10-CM | POA: Diagnosis not present

## 2023-02-01 DIAGNOSIS — F02A4 Dementia in other diseases classified elsewhere, mild, with anxiety: Secondary | ICD-10-CM | POA: Diagnosis not present

## 2023-02-01 DIAGNOSIS — F322 Major depressive disorder, single episode, severe without psychotic features: Secondary | ICD-10-CM | POA: Diagnosis not present

## 2023-02-01 DIAGNOSIS — G300 Alzheimer's disease with early onset: Secondary | ICD-10-CM | POA: Diagnosis not present

## 2023-02-01 DIAGNOSIS — E1165 Type 2 diabetes mellitus with hyperglycemia: Secondary | ICD-10-CM | POA: Diagnosis not present

## 2023-02-01 DIAGNOSIS — M5441 Lumbago with sciatica, right side: Secondary | ICD-10-CM | POA: Diagnosis not present

## 2023-02-06 DIAGNOSIS — F322 Major depressive disorder, single episode, severe without psychotic features: Secondary | ICD-10-CM | POA: Diagnosis not present

## 2023-02-06 DIAGNOSIS — F02B3 Dementia in other diseases classified elsewhere, moderate, with mood disturbance: Secondary | ICD-10-CM | POA: Diagnosis not present

## 2023-02-06 DIAGNOSIS — M5441 Lumbago with sciatica, right side: Secondary | ICD-10-CM | POA: Diagnosis not present

## 2023-02-06 DIAGNOSIS — E1165 Type 2 diabetes mellitus with hyperglycemia: Secondary | ICD-10-CM | POA: Diagnosis not present

## 2023-02-06 DIAGNOSIS — G300 Alzheimer's disease with early onset: Secondary | ICD-10-CM | POA: Diagnosis not present

## 2023-02-06 DIAGNOSIS — F02A4 Dementia in other diseases classified elsewhere, mild, with anxiety: Secondary | ICD-10-CM | POA: Diagnosis not present

## 2023-02-14 DIAGNOSIS — K219 Gastro-esophageal reflux disease without esophagitis: Secondary | ICD-10-CM | POA: Diagnosis not present

## 2023-02-14 DIAGNOSIS — D649 Anemia, unspecified: Secondary | ICD-10-CM | POA: Diagnosis not present

## 2023-02-14 DIAGNOSIS — E1165 Type 2 diabetes mellitus with hyperglycemia: Secondary | ICD-10-CM | POA: Diagnosis not present

## 2023-02-14 DIAGNOSIS — N281 Cyst of kidney, acquired: Secondary | ICD-10-CM | POA: Diagnosis not present

## 2023-02-14 DIAGNOSIS — F322 Major depressive disorder, single episode, severe without psychotic features: Secondary | ICD-10-CM | POA: Diagnosis not present

## 2023-02-14 DIAGNOSIS — G300 Alzheimer's disease with early onset: Secondary | ICD-10-CM | POA: Diagnosis not present

## 2023-02-14 DIAGNOSIS — R251 Tremor, unspecified: Secondary | ICD-10-CM | POA: Diagnosis not present

## 2023-02-14 DIAGNOSIS — Z9181 History of falling: Secondary | ICD-10-CM | POA: Diagnosis not present

## 2023-02-14 DIAGNOSIS — E559 Vitamin D deficiency, unspecified: Secondary | ICD-10-CM | POA: Diagnosis not present

## 2023-02-14 DIAGNOSIS — Z7984 Long term (current) use of oral hypoglycemic drugs: Secondary | ICD-10-CM | POA: Diagnosis not present

## 2023-02-14 DIAGNOSIS — J309 Allergic rhinitis, unspecified: Secondary | ICD-10-CM | POA: Diagnosis not present

## 2023-02-14 DIAGNOSIS — M5441 Lumbago with sciatica, right side: Secondary | ICD-10-CM | POA: Diagnosis not present

## 2023-02-14 DIAGNOSIS — F02B3 Dementia in other diseases classified elsewhere, moderate, with mood disturbance: Secondary | ICD-10-CM | POA: Diagnosis not present

## 2023-02-14 DIAGNOSIS — E7800 Pure hypercholesterolemia, unspecified: Secondary | ICD-10-CM | POA: Diagnosis not present

## 2023-02-14 DIAGNOSIS — N2889 Other specified disorders of kidney and ureter: Secondary | ICD-10-CM | POA: Diagnosis not present

## 2023-02-14 DIAGNOSIS — H401112 Primary open-angle glaucoma, right eye, moderate stage: Secondary | ICD-10-CM | POA: Diagnosis not present

## 2023-02-14 DIAGNOSIS — I10 Essential (primary) hypertension: Secondary | ICD-10-CM | POA: Diagnosis not present

## 2023-02-14 DIAGNOSIS — F02A4 Dementia in other diseases classified elsewhere, mild, with anxiety: Secondary | ICD-10-CM | POA: Diagnosis not present

## 2023-02-20 DIAGNOSIS — M5441 Lumbago with sciatica, right side: Secondary | ICD-10-CM | POA: Diagnosis not present

## 2023-02-20 DIAGNOSIS — F02B3 Dementia in other diseases classified elsewhere, moderate, with mood disturbance: Secondary | ICD-10-CM | POA: Diagnosis not present

## 2023-02-20 DIAGNOSIS — G300 Alzheimer's disease with early onset: Secondary | ICD-10-CM | POA: Diagnosis not present

## 2023-02-20 DIAGNOSIS — F02A4 Dementia in other diseases classified elsewhere, mild, with anxiety: Secondary | ICD-10-CM | POA: Diagnosis not present

## 2023-02-20 DIAGNOSIS — F322 Major depressive disorder, single episode, severe without psychotic features: Secondary | ICD-10-CM | POA: Diagnosis not present

## 2023-02-20 DIAGNOSIS — E1165 Type 2 diabetes mellitus with hyperglycemia: Secondary | ICD-10-CM | POA: Diagnosis not present

## 2023-02-26 DIAGNOSIS — G300 Alzheimer's disease with early onset: Secondary | ICD-10-CM | POA: Diagnosis not present

## 2023-02-26 DIAGNOSIS — M5441 Lumbago with sciatica, right side: Secondary | ICD-10-CM | POA: Diagnosis not present

## 2023-02-26 DIAGNOSIS — E1165 Type 2 diabetes mellitus with hyperglycemia: Secondary | ICD-10-CM | POA: Diagnosis not present

## 2023-02-26 DIAGNOSIS — F02B3 Dementia in other diseases classified elsewhere, moderate, with mood disturbance: Secondary | ICD-10-CM | POA: Diagnosis not present

## 2023-02-26 DIAGNOSIS — F02A4 Dementia in other diseases classified elsewhere, mild, with anxiety: Secondary | ICD-10-CM | POA: Diagnosis not present

## 2023-02-26 DIAGNOSIS — F322 Major depressive disorder, single episode, severe without psychotic features: Secondary | ICD-10-CM | POA: Diagnosis not present

## 2023-03-04 DIAGNOSIS — F322 Major depressive disorder, single episode, severe without psychotic features: Secondary | ICD-10-CM | POA: Diagnosis not present

## 2023-03-04 DIAGNOSIS — F02A4 Dementia in other diseases classified elsewhere, mild, with anxiety: Secondary | ICD-10-CM | POA: Diagnosis not present

## 2023-03-04 DIAGNOSIS — M5441 Lumbago with sciatica, right side: Secondary | ICD-10-CM | POA: Diagnosis not present

## 2023-03-04 DIAGNOSIS — F02B3 Dementia in other diseases classified elsewhere, moderate, with mood disturbance: Secondary | ICD-10-CM | POA: Diagnosis not present

## 2023-03-04 DIAGNOSIS — G300 Alzheimer's disease with early onset: Secondary | ICD-10-CM | POA: Diagnosis not present

## 2023-03-04 DIAGNOSIS — E1165 Type 2 diabetes mellitus with hyperglycemia: Secondary | ICD-10-CM | POA: Diagnosis not present

## 2023-03-05 ENCOUNTER — Ambulatory Visit (HOSPITAL_COMMUNITY)
Admission: RE | Admit: 2023-03-05 | Discharge: 2023-03-05 | Disposition: A | Discharge status: Home / Self Care | Payer: Medicare Other | Source: Ambulatory Visit | Attending: Internal Medicine | Admitting: Internal Medicine

## 2023-03-05 ENCOUNTER — Inpatient Hospital Stay: Payer: Medicare Other | Attending: Internal Medicine

## 2023-03-05 DIAGNOSIS — I700 Atherosclerosis of aorta: Secondary | ICD-10-CM | POA: Diagnosis not present

## 2023-03-05 DIAGNOSIS — C3492 Malignant neoplasm of unspecified part of left bronchus or lung: Secondary | ICD-10-CM

## 2023-03-05 DIAGNOSIS — K449 Diaphragmatic hernia without obstruction or gangrene: Secondary | ICD-10-CM | POA: Diagnosis not present

## 2023-03-05 DIAGNOSIS — C3490 Malignant neoplasm of unspecified part of unspecified bronchus or lung: Secondary | ICD-10-CM | POA: Insufficient documentation

## 2023-03-05 LAB — CBC WITH DIFFERENTIAL (CANCER CENTER ONLY)
Abs Immature Granulocytes: 0.03 10*3/uL (ref 0.00–0.07)
Basophils Absolute: 0.1 10*3/uL (ref 0.0–0.1)
Basophils Relative: 1 %
Eosinophils Absolute: 0.3 10*3/uL (ref 0.0–0.5)
Eosinophils Relative: 4 %
HCT: 42.6 % (ref 36.0–46.0)
Hemoglobin: 14.3 g/dL (ref 12.0–15.0)
Immature Granulocytes: 0 %
Lymphocytes Relative: 23 %
Lymphs Abs: 1.9 10*3/uL (ref 0.7–4.0)
MCH: 30.3 pg (ref 26.0–34.0)
MCHC: 33.6 g/dL (ref 30.0–36.0)
MCV: 90.3 fL (ref 80.0–100.0)
Monocytes Absolute: 0.6 10*3/uL (ref 0.1–1.0)
Monocytes Relative: 7 %
Neutro Abs: 5.2 10*3/uL (ref 1.7–7.7)
Neutrophils Relative %: 65 %
Platelet Count: 260 10*3/uL (ref 150–400)
RBC: 4.72 MIL/uL (ref 3.87–5.11)
RDW: 14.3 % (ref 11.5–15.5)
WBC Count: 8 10*3/uL (ref 4.0–10.5)
nRBC: 0 % (ref 0.0–0.2)

## 2023-03-05 LAB — CMP (CANCER CENTER ONLY)
ALT: 17 U/L (ref 0–44)
AST: 15 U/L (ref 15–41)
Albumin: 3.9 g/dL (ref 3.5–5.0)
Alkaline Phosphatase: 91 U/L (ref 38–126)
Anion gap: 8 (ref 5–15)
BUN: 23 mg/dL (ref 8–23)
CO2: 26 mmol/L (ref 22–32)
Calcium: 9.6 mg/dL (ref 8.9–10.3)
Chloride: 106 mmol/L (ref 98–111)
Creatinine: 1.04 mg/dL — ABNORMAL HIGH (ref 0.44–1.00)
GFR, Estimated: 56 mL/min — ABNORMAL LOW (ref >60)
Glucose, Bld: 186 mg/dL — ABNORMAL HIGH (ref 70–99)
Potassium: 4.3 mmol/L (ref 3.5–5.1)
Sodium: 140 mmol/L (ref 135–145)
Total Bilirubin: 0.5 mg/dL (ref 0.3–1.2)
Total Protein: 6.8 g/dL (ref 6.5–8.1)

## 2023-03-05 MED ORDER — IOHEXOL 300 MG/ML  SOLN
75.0000 mL | Freq: Once | INTRAMUSCULAR | Status: AC | PRN
  Administered 2023-03-05: 75 mL via INTRAVENOUS

## 2023-03-06 DIAGNOSIS — Z1211 Encounter for screening for malignant neoplasm of colon: Secondary | ICD-10-CM | POA: Diagnosis not present

## 2023-03-06 DIAGNOSIS — K269 Duodenal ulcer, unspecified as acute or chronic, without hemorrhage or perforation: Secondary | ICD-10-CM | POA: Diagnosis not present

## 2023-03-07 ENCOUNTER — Inpatient Hospital Stay: Payer: Medicare Other | Admitting: Internal Medicine

## 2023-03-12 DIAGNOSIS — R399 Unspecified symptoms and signs involving the genitourinary system: Secondary | ICD-10-CM | POA: Diagnosis not present

## 2023-03-12 DIAGNOSIS — I10 Essential (primary) hypertension: Secondary | ICD-10-CM | POA: Diagnosis not present

## 2023-03-13 ENCOUNTER — Inpatient Hospital Stay: Payer: Medicare Other | Admitting: Physician Assistant

## 2023-03-13 DIAGNOSIS — E1165 Type 2 diabetes mellitus with hyperglycemia: Secondary | ICD-10-CM | POA: Diagnosis not present

## 2023-03-13 DIAGNOSIS — F02A4 Dementia in other diseases classified elsewhere, mild, with anxiety: Secondary | ICD-10-CM | POA: Diagnosis not present

## 2023-03-13 DIAGNOSIS — F322 Major depressive disorder, single episode, severe without psychotic features: Secondary | ICD-10-CM | POA: Diagnosis not present

## 2023-03-13 DIAGNOSIS — F02B3 Dementia in other diseases classified elsewhere, moderate, with mood disturbance: Secondary | ICD-10-CM | POA: Diagnosis not present

## 2023-03-13 DIAGNOSIS — M5441 Lumbago with sciatica, right side: Secondary | ICD-10-CM | POA: Diagnosis not present

## 2023-03-13 DIAGNOSIS — G300 Alzheimer's disease with early onset: Secondary | ICD-10-CM | POA: Diagnosis not present

## 2023-03-19 NOTE — Progress Notes (Unsigned)
[facility] OFFICE PROGRESS NOTE  Shon Hale, MD [address] [place] [address]  DIAGNOSIS: 1) Stage IV (T1b, N0, M1a) low-grade neuroendocrine carcinoma (carcinoid tumor), presented with innumerable bilateral pulmonary nodules diagnosed in March 2023.  2) left renal lesion suspicious for renal cell carcinoma, seen on imaging on 10/17/22   PRIOR THERAPY: None   CURRENT THERAPY: Observation   INTERVAL HISTORY: Michaela Santana 77 y.o. female returns to the clinic today for a follow up visit accompanied by her daughter.  She was last seen in the clinic on 11/07/2022 with a restaging CT scan..  At her last appointment, the patient was actively having a panic attack she had a repeat scan at that time that did not show any evidence of disease progression.  She also follows with urology regarding the renal lesion may have ***.  Dr. Mena Goes.   After she was seen, she presented to the emergency room with emesis and abdominal discomfort and worsening confusion from her normal baseline dementia.  She was found to have duodenitis she was treated with Rocephin and Flagyl.  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  She was put on Lovenox but she developed a GI bleed and this was on hold.  She was extremely high risk for rebleeding due to a duodenal ulcer.  She is followed by Dr. Marland KitchenFrom GI on an outpatient basis.  Otherwise, she denies any major changes in her health since she was last seen. She denies fever, chills, night sweats, lymphadenopathy, or weight loss. She denied having any current chest pain, shortness of breath, cough or hemoptysis. She has no more nausea, vomiting, diarrhea or constipation except she had some nausea/vomiting this morning. She denies back pain. She denies any more hematuria since her catheter was removed. She recently had a restaging CT scan performed. She is here for revaluation and a more a detailed discussion about her current condition  and recommended treatment.      MEDICAL HISTORY: Past Medical History:  Diagnosis Date   Allergic rhinitis    Anemia    Arthritis    "probably in hands" (07/17/2016)   CKD (chronic kidney disease), stage III (HCC)    Depression    Esophageal reflux    Family history of adverse reaction to anesthesia    "her father had some issues when he had his gallbladder taken out when he was in his late 16s"   GERD (gastroesophageal reflux disease)    Glaucoma    open angle, bilateral   High cholesterol    History of kidney stones    Hypercholesterolemia    Hypertension    Kidney cysts    Lumbago with sciatica, right side    Sepsis (HCC) 07/2016   d/t flu   Type II diabetes mellitus (HCC)    Vitamin D deficiency     ALLERGIES:  is allergic to codeine and alprazolam.  MEDICATIONS:  Current Outpatient Medications  Medication Sig Dispense Refill   acetaminophen (TYLENOL) 650 MG CR tablet Take 650 mg by mouth every 8 (eight) hours as needed for pain.     albuterol (VENTOLIN HFA) 108 (90 Base) MCG/ACT inhaler Inhale into the lungs every 6 (six) hours as needed for wheezing or shortness of breath.     amLODipine (NORVASC) 5 MG tablet Take 5 mg by mouth daily.     Artificial Tear Solution (SYSTANE CONTACTS OP) Apply 0.4 drops to eye as needed.     Blood  Glucose Monitoring Suppl (FREESTYLE LITE) DEVI USE UTD  0   calcium carbonate (TUMS - DOSED IN MG ELEMENTAL CALCIUM) 500 MG chewable tablet Chew 1 tablet by mouth daily.     DULoxetine HCl 40 MG CPEP Take 1 capsule by mouth daily.     ferrous sulfate 325 (65 FE) MG EC tablet Take 325 mg by mouth daily.     FREESTYLE LITE test strip USE TO CHECK FASTING GLUCOSE IN THE MORNING AND 2 HOURS AFTER DINNER OR LUNCH  3   furosemide (LASIX) 20 MG tablet Take 1 tablet (20 mg total) by mouth daily as needed (swelling). 30 tablet 3   gabapentin (NEURONTIN) 300 MG capsule Take 300 mg by mouth every evening.     glipiZIDE (GLUCOTROL XL) 5 MG 24 hr tablet  Take 5 mg by mouth daily with breakfast.     Lancets (FREESTYLE) lancets      latanoprost (XALATAN) 0.005 % ophthalmic solution Place 1 drop into the right eye at bedtime.     loratadine (CLARITIN) 10 MG tablet Take 10 mg by mouth daily as needed for allergies.     LORazepam (ATIVAN) 0.5 MG tablet Take 0.5 mg by mouth every 8 (eight) hours.     losartan (COZAAR) 100 MG tablet Take 0.5 tablets (50 mg total) by mouth daily. 30 tablet 1   magnesium oxide (MAG-OX) 400 (240 Mg) MG tablet Take 800 mg by mouth 2 (two) times daily.     ondansetron (ZOFRAN) 8 MG tablet Take 8 mg by mouth every 8 (eight) hours as needed for nausea/vomiting.     oxymetazoline (AFRIN) 0.05 % nasal spray Place 1 spray into both nostrils 2 (two) times daily.     pantoprazole (PROTONIX) 40 MG tablet Take 1 tablet (40 mg total) by mouth 2 (two) times daily before a meal. 120 tablet 0   QUEtiapine (SEROQUEL) 25 MG tablet Take 12.5 mg by mouth every other day.     vitamin B-12 (CYANOCOBALAMIN) 1000 MCG tablet Take 1 tablet (1,000 mcg total) by mouth daily. 30 tablet 0   No current facility-administered medications for this visit.    SURGICAL HISTORY:  Past Surgical History:  Procedure Laterality Date   BREAST BIOPSY  1990   "? side; it wasn't anything"   BRONCHIAL BIOPSY  08/01/2021   Procedure: BRONCHIAL BIOPSIES;  Surgeon: Josephine Igo, DO;  Location: [facility];  Service: Pulmonary;;   BRONCHIAL NEEDLE ASPIRATION BIOPSY  08/01/2021   Procedure: BRONCHIAL NEEDLE ASPIRATION BIOPSIES;  Surgeon: Josephine Igo, DO;  Location: [facility];  Service: Pulmonary;;   CATARACT EXTRACTION, BILATERAL  2021   CHOLECYSTECTOMY OPEN  1971   COLONOSCOPY     2001,2011,2023   ESOPHAGOGASTRODUODENOSCOPY (EGD) WITH PROPOFOL N/A 11/10/2022   Procedure: ESOPHAGOGASTRODUODENOSCOPY (EGD) WITH PROPOFOL;  Surgeon: Willis Modena, MD;  Location: [facility] ENDOSCOPY;  Service: Gastroenterology;  Laterality: N/A;   TUBAL LIGATION  1978   VIDEO  BRONCHOSCOPY WITH RADIAL ENDOBRONCHIAL ULTRASOUND  08/01/2021   Procedure: RADIAL ENDOBRONCHIAL ULTRASOUND;  Surgeon: Josephine Igo, DO;  Location: [facility]OS[facility];  Service: Pulmonary;;    REVIEW OF SYSTEMS:   Review of Systems  Constitutional: Negative for appetite change, chills, fatigue, fever and unexpected weight change.  HENT:   Negative for mouth sores, nosebleeds, sore throat and trouble swallowing.   Eyes: Negative for eye problems and icterus.  Respiratory: Negative for cough, hemoptysis, shortness of breath and wheezing.   Cardiovascular: Negative for chest pain and leg swelling.  Gastrointestinal:  Negative for abdominal pain, constipation, diarrhea, nausea and vomiting.  Genitourinary: Negative for bladder incontinence, difficulty urinating, dysuria, frequency and hematuria.   Musculoskeletal: Negative for back pain, gait problem, neck pain and neck stiffness.  Skin: Negative for itching and rash.  Neurological: Negative for dizziness, extremity weakness, gait problem, headaches, light-headedness and seizures.  Hematological: Negative for adenopathy. Does not bruise/bleed easily.  Psychiatric/Behavioral: Negative for confusion, depression and sleep disturbance. The patient is not nervous/anxious.     PHYSICAL EXAMINATION:  There were no vitals taken for this visit.  ECOG PERFORMANCE STATUS: {CHL ONC ECOG [id number]  Physical Exam  Constitutional: Oriented to person, place, and time and well-developed, well-nourished, and in no distress. No distress.  HENT:  Head: Normocephalic and atraumatic.  Mouth/Throat: Oropharynx is clear and moist. No oropharyngeal exudate.  Eyes: Conjunctivae are normal. Right eye exhibits no discharge. Left eye exhibits no discharge. No scleral icterus.  Neck: Normal range of motion. Neck supple.  Cardiovascular: Normal rate, regular rhythm, normal heart sounds and intact distal pulses.   Pulmonary/Chest: Effort normal and breath sounds  normal. No respiratory distress. No wheezes. No rales.  Abdominal: Soft. Bowel sounds are normal. Exhibits no distension and no mass. There is no tenderness.  Musculoskeletal: Normal range of motion. Exhibits no edema.  Lymphadenopathy:    No cervical adenopathy.  Neurological: Alert and oriented to person, place, and time. Exhibits normal muscle tone. Gait normal. Coordination normal.  Skin: Skin is warm and dry. No rash noted. Not diaphoretic. No erythema. No pallor.  Psychiatric: Mood, memory and judgment normal.  Vitals reviewed.  LABORATORY DATA: Lab Results  Component Value Date   WBC 8.0 03/05/2023   HGB 14.3 03/05/2023   HCT 42.6 03/05/2023   MCV 90.3 03/05/2023   PLT 260 03/05/2023      Chemistry      Component Value Date/Time   NA 140 03/05/2023 1117   K 4.3 03/05/2023 1117   CL 106 03/05/2023 1117   CO2 26 03/05/2023 1117   BUN 23 03/05/2023 1117   CREATININE 1.04 (H) 03/05/2023 1117      Component Value Date/Time   CALCIUM 9.6 03/05/2023 1117   ALKPHOS 91 03/05/2023 1117   AST 15 03/05/2023 1117   ALT 17 03/05/2023 1117   BILITOT 0.5 03/05/2023 1117       RADIOGRAPHIC STUDIES:  CT Chest W Contrast  Result Date: 03/18/2023 CLINICAL DATA:  Non-small cell lung cancer.  * Tracking Code: BO * EXAM: CT CHEST WITH CONTRAST TECHNIQUE: Multidetector CT imaging of the chest was performed during intravenous contrast administration. RADIATION DOSE REDUCTION: This exam was performed according to the departmental dose-optimization program which includes automated exposure control, adjustment of the mA and/or kV according to patient size and/or use of iterative reconstruction technique. CONTRAST:  75mL OMNIPAQUE IOHEXOL 300 MG/ML  SOLN COMPARISON:  08/31/2022. FINDINGS: Cardiovascular: Atherosclerotic calcification of the aorta. Heart is enlarged. No pericardial effusion. Mediastinum/Nodes: No pathologically enlarged mediastinal, hilar or axillary lymph nodes. Esophagus is  grossly unremarkable. Lungs/Pleura: Hematogenously distributed pulmonary nodules are seen throughout the lungs and are unchanged from 08/31/2022. Index inferior left lower lobe nodule measures 11 mm (5/100), unchanged. Chronic atelectasis in the medial aspects of both lower lobes, adjacent to a hiatal hernia. No pleural fluid. Airway is unremarkable. Upper Abdomen: Vague area of rounded low-attenuation in segment IV of the liver measures approximately 2.1 x 2.9 cm (2/122), questionably faintly present on 08/31/2022. Adrenal glands are unremarkable. Renal cortical thinning and scarring bilaterally. Visualized  portions of the kidneys, spleen, pancreas, stomach and bowel are grossly unremarkable with the exception of a moderate hiatal hernia. Likely reactive periportal lymph nodes, as on 08/31/2022. Musculoskeletal: Degenerative changes in the spine. No worrisome lytic or sclerotic lesions. IMPRESSION: 1. 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 2. Vague area of rounded low attenuation in segment IV of the liver, questionably faintly present on 08/29/2022. Finding may represent focal fat. Malignancy cannot be excluded. If further evaluation is desired, MR abdomen without and with contrast is recommended. 3. Moderate hiatal hernia. 4.  Aortic atherosclerosis (ICD10-I70.0). Electronically Signed   By: Leanna Battles M.D.   On: 03/18/2023 14:52     ASSESSMENT/PLAN:  This is a very pleasant 77 year old Caucasian female diagnosed with  1) stage IV (T1b, N0, M1 a) low-grade neuroendocrine carcinoma (carcinoid tumor) presented with innumerable bilateral pulmonary nodules diagnosed in March 2023.  2) left renal lesion suspicious for renal cell carcinoma   Regarding the neuroendocrine tumor, CT-guided core biopsy of the left lung nodule was consistent with low-grade neuroendocrine carcinoma. The NCCN guideline would  recommend continuous observation and monitoring for patient with bilateral pulmonary nodules consistent with a carcinoid tumor unless the patient is symptomatic then we would consider her for treatment with octreotide LAR to control her symptoms.    For the renal lesion, she is established with urology. Per hospital notes, they are considering partial vs radical nephrectomy vs ablation. She saw Dr. Mena Goes who recommended ***.   Patient recently had a restaging CT scan performed.  The patient was seen with Dr. Arbutus Ped today.  Dr. Arbutus Ped personally and independently reviewed the scan as discussed results with patient today.  The scan showed ***.  Dr. Arbutus Ped recommends that she continue on observation.  We will see her back for follow-up visit in ***months for evaluation and repeat CT scan.  ***  The patient was advised to call immediately if she has any concerning symptoms in the interval. The patient voices understanding of current disease status and treatment options and is in agreement with the current care plan. All questions were answered. The patient knows to call the clinic with any problems, questions or concerns. We can certainly see the patient much sooner if necessary       No orders of the defined types were placed in this encounter.    I spent {CHL ONC TIME VISIT - ZOXWR:[id number]} counseling the patient face to face. The total time spent in the appointment was {CHL ONC TIME VISIT - BJYNW:[id number]}.  Amelya Mabry L Elynor Kallenberger, PA-C 03/19/23

## 2023-03-20 DIAGNOSIS — K449 Diaphragmatic hernia without obstruction or gangrene: Secondary | ICD-10-CM | POA: Diagnosis not present

## 2023-03-20 DIAGNOSIS — K2930 Chronic superficial gastritis without bleeding: Secondary | ICD-10-CM | POA: Diagnosis not present

## 2023-03-20 DIAGNOSIS — K5710 Diverticulosis of small intestine without perforation or abscess without bleeding: Secondary | ICD-10-CM | POA: Diagnosis not present

## 2023-03-20 DIAGNOSIS — K274 Chronic or unspecified peptic ulcer, site unspecified, with hemorrhage: Secondary | ICD-10-CM | POA: Diagnosis not present

## 2023-03-21 ENCOUNTER — Encounter: Payer: Self-pay | Admitting: Physician Assistant

## 2023-03-21 ENCOUNTER — Inpatient Hospital Stay: Payer: Medicare Other | Attending: Physician Assistant | Admitting: Physician Assistant

## 2023-03-21 VITALS — BP 138/74 | HR 66 | Temp 97.8°F | Resp 17 | Wt 180.0 lb

## 2023-03-21 DIAGNOSIS — C7A090 Malignant carcinoid tumor of the bronchus and lung: Secondary | ICD-10-CM | POA: Diagnosis not present

## 2023-03-21 DIAGNOSIS — C3492 Malignant neoplasm of unspecified part of left bronchus or lung: Secondary | ICD-10-CM

## 2023-03-21 DIAGNOSIS — N2889 Other specified disorders of kidney and ureter: Secondary | ICD-10-CM | POA: Diagnosis not present

## 2023-03-27 DIAGNOSIS — K2930 Chronic superficial gastritis without bleeding: Secondary | ICD-10-CM | POA: Diagnosis not present

## 2023-05-14 DIAGNOSIS — Z743 Need for continuous supervision: Secondary | ICD-10-CM | POA: Diagnosis not present

## 2023-05-14 DIAGNOSIS — R29818 Other symptoms and signs involving the nervous system: Secondary | ICD-10-CM | POA: Diagnosis not present

## 2023-05-14 DIAGNOSIS — F03B0 Unspecified dementia, moderate, without behavioral disturbance, psychotic disturbance, mood disturbance, and anxiety: Secondary | ICD-10-CM | POA: Diagnosis not present

## 2023-05-14 DIAGNOSIS — K219 Gastro-esophageal reflux disease without esophagitis: Secondary | ICD-10-CM | POA: Diagnosis not present

## 2023-05-14 DIAGNOSIS — F0390 Unspecified dementia without behavioral disturbance: Secondary | ICD-10-CM | POA: Diagnosis not present

## 2023-05-14 DIAGNOSIS — R410 Disorientation, unspecified: Secondary | ICD-10-CM | POA: Diagnosis not present

## 2023-05-14 DIAGNOSIS — I10 Essential (primary) hypertension: Secondary | ICD-10-CM | POA: Diagnosis not present

## 2023-05-14 DIAGNOSIS — R062 Wheezing: Secondary | ICD-10-CM | POA: Diagnosis not present

## 2023-05-14 DIAGNOSIS — Z0000 Encounter for general adult medical examination without abnormal findings: Secondary | ICD-10-CM | POA: Diagnosis not present

## 2023-05-14 DIAGNOSIS — F322 Major depressive disorder, single episode, severe without psychotic features: Secondary | ICD-10-CM | POA: Diagnosis not present

## 2023-05-14 DIAGNOSIS — E1122 Type 2 diabetes mellitus with diabetic chronic kidney disease: Secondary | ICD-10-CM | POA: Diagnosis not present

## 2023-05-16 DIAGNOSIS — K269 Duodenal ulcer, unspecified as acute or chronic, without hemorrhage or perforation: Secondary | ICD-10-CM | POA: Diagnosis not present

## 2023-05-16 DIAGNOSIS — Z9181 History of falling: Secondary | ICD-10-CM | POA: Diagnosis not present

## 2023-05-16 DIAGNOSIS — D631 Anemia in chronic kidney disease: Secondary | ICD-10-CM | POA: Diagnosis not present

## 2023-05-16 DIAGNOSIS — F03B3 Unspecified dementia, moderate, with mood disturbance: Secondary | ICD-10-CM | POA: Diagnosis not present

## 2023-05-16 DIAGNOSIS — I491 Atrial premature depolarization: Secondary | ICD-10-CM | POA: Diagnosis not present

## 2023-05-16 DIAGNOSIS — E7800 Pure hypercholesterolemia, unspecified: Secondary | ICD-10-CM | POA: Diagnosis not present

## 2023-05-16 DIAGNOSIS — H9190 Unspecified hearing loss, unspecified ear: Secondary | ICD-10-CM | POA: Diagnosis not present

## 2023-05-16 DIAGNOSIS — M545 Low back pain, unspecified: Secondary | ICD-10-CM | POA: Diagnosis not present

## 2023-05-16 DIAGNOSIS — Z7984 Long term (current) use of oral hypoglycemic drugs: Secondary | ICD-10-CM | POA: Diagnosis not present

## 2023-05-16 DIAGNOSIS — D509 Iron deficiency anemia, unspecified: Secondary | ICD-10-CM | POA: Diagnosis not present

## 2023-05-16 DIAGNOSIS — N189 Chronic kidney disease, unspecified: Secondary | ICD-10-CM | POA: Diagnosis not present

## 2023-05-16 DIAGNOSIS — F329 Major depressive disorder, single episode, unspecified: Secondary | ICD-10-CM | POA: Diagnosis not present

## 2023-05-16 DIAGNOSIS — E559 Vitamin D deficiency, unspecified: Secondary | ICD-10-CM | POA: Diagnosis not present

## 2023-05-16 DIAGNOSIS — F03B18 Unspecified dementia, moderate, with other behavioral disturbance: Secondary | ICD-10-CM | POA: Diagnosis not present

## 2023-05-16 DIAGNOSIS — I10 Essential (primary) hypertension: Secondary | ICD-10-CM | POA: Diagnosis not present

## 2023-05-16 DIAGNOSIS — Q6100 Congenital renal cyst, unspecified: Secondary | ICD-10-CM | POA: Diagnosis not present

## 2023-05-16 DIAGNOSIS — K219 Gastro-esophageal reflux disease without esophagitis: Secondary | ICD-10-CM | POA: Diagnosis not present

## 2023-05-16 DIAGNOSIS — E669 Obesity, unspecified: Secondary | ICD-10-CM | POA: Diagnosis not present

## 2023-05-16 DIAGNOSIS — H401131 Primary open-angle glaucoma, bilateral, mild stage: Secondary | ICD-10-CM | POA: Diagnosis not present

## 2023-05-16 DIAGNOSIS — Z6835 Body mass index (BMI) 35.0-35.9, adult: Secondary | ICD-10-CM | POA: Diagnosis not present

## 2023-05-16 DIAGNOSIS — E1122 Type 2 diabetes mellitus with diabetic chronic kidney disease: Secondary | ICD-10-CM | POA: Diagnosis not present

## 2023-05-16 DIAGNOSIS — F05 Delirium due to known physiological condition: Secondary | ICD-10-CM | POA: Diagnosis not present

## 2023-05-21 DIAGNOSIS — F05 Delirium due to known physiological condition: Secondary | ICD-10-CM | POA: Diagnosis not present

## 2023-05-21 DIAGNOSIS — M545 Low back pain, unspecified: Secondary | ICD-10-CM | POA: Diagnosis not present

## 2023-05-21 DIAGNOSIS — F03B3 Unspecified dementia, moderate, with mood disturbance: Secondary | ICD-10-CM | POA: Diagnosis not present

## 2023-05-21 DIAGNOSIS — F03B18 Unspecified dementia, moderate, with other behavioral disturbance: Secondary | ICD-10-CM | POA: Diagnosis not present

## 2023-05-21 DIAGNOSIS — I10 Essential (primary) hypertension: Secondary | ICD-10-CM | POA: Diagnosis not present

## 2023-05-21 DIAGNOSIS — F329 Major depressive disorder, single episode, unspecified: Secondary | ICD-10-CM | POA: Diagnosis not present

## 2023-05-23 DIAGNOSIS — F03B18 Unspecified dementia, moderate, with other behavioral disturbance: Secondary | ICD-10-CM | POA: Diagnosis not present

## 2023-05-23 DIAGNOSIS — N390 Urinary tract infection, site not specified: Secondary | ICD-10-CM | POA: Diagnosis not present

## 2023-05-23 DIAGNOSIS — M545 Low back pain, unspecified: Secondary | ICD-10-CM | POA: Diagnosis not present

## 2023-05-23 DIAGNOSIS — F329 Major depressive disorder, single episode, unspecified: Secondary | ICD-10-CM | POA: Diagnosis not present

## 2023-05-23 DIAGNOSIS — F05 Delirium due to known physiological condition: Secondary | ICD-10-CM | POA: Diagnosis not present

## 2023-05-23 DIAGNOSIS — I10 Essential (primary) hypertension: Secondary | ICD-10-CM | POA: Diagnosis not present

## 2023-05-23 DIAGNOSIS — F03B3 Unspecified dementia, moderate, with mood disturbance: Secondary | ICD-10-CM | POA: Diagnosis not present

## 2023-05-28 DIAGNOSIS — F03B18 Unspecified dementia, moderate, with other behavioral disturbance: Secondary | ICD-10-CM | POA: Diagnosis not present

## 2023-05-28 DIAGNOSIS — F05 Delirium due to known physiological condition: Secondary | ICD-10-CM | POA: Diagnosis not present

## 2023-05-28 DIAGNOSIS — F329 Major depressive disorder, single episode, unspecified: Secondary | ICD-10-CM | POA: Diagnosis not present

## 2023-05-28 DIAGNOSIS — M545 Low back pain, unspecified: Secondary | ICD-10-CM | POA: Diagnosis not present

## 2023-05-28 DIAGNOSIS — I10 Essential (primary) hypertension: Secondary | ICD-10-CM | POA: Diagnosis not present

## 2023-05-28 DIAGNOSIS — F03B3 Unspecified dementia, moderate, with mood disturbance: Secondary | ICD-10-CM | POA: Diagnosis not present

## 2023-05-30 DIAGNOSIS — M545 Low back pain, unspecified: Secondary | ICD-10-CM | POA: Diagnosis not present

## 2023-05-30 DIAGNOSIS — F329 Major depressive disorder, single episode, unspecified: Secondary | ICD-10-CM | POA: Diagnosis not present

## 2023-05-30 DIAGNOSIS — I10 Essential (primary) hypertension: Secondary | ICD-10-CM | POA: Diagnosis not present

## 2023-05-30 DIAGNOSIS — F03B3 Unspecified dementia, moderate, with mood disturbance: Secondary | ICD-10-CM | POA: Diagnosis not present

## 2023-05-30 DIAGNOSIS — F03B18 Unspecified dementia, moderate, with other behavioral disturbance: Secondary | ICD-10-CM | POA: Diagnosis not present

## 2023-05-30 DIAGNOSIS — F05 Delirium due to known physiological condition: Secondary | ICD-10-CM | POA: Diagnosis not present

## 2023-06-11 DIAGNOSIS — F329 Major depressive disorder, single episode, unspecified: Secondary | ICD-10-CM | POA: Diagnosis not present

## 2023-06-11 DIAGNOSIS — I10 Essential (primary) hypertension: Secondary | ICD-10-CM | POA: Diagnosis not present

## 2023-06-11 DIAGNOSIS — F05 Delirium due to known physiological condition: Secondary | ICD-10-CM | POA: Diagnosis not present

## 2023-06-11 DIAGNOSIS — F03B3 Unspecified dementia, moderate, with mood disturbance: Secondary | ICD-10-CM | POA: Diagnosis not present

## 2023-06-11 DIAGNOSIS — F03B18 Unspecified dementia, moderate, with other behavioral disturbance: Secondary | ICD-10-CM | POA: Diagnosis not present

## 2023-06-11 DIAGNOSIS — M545 Low back pain, unspecified: Secondary | ICD-10-CM | POA: Diagnosis not present

## 2023-06-13 DIAGNOSIS — M545 Low back pain, unspecified: Secondary | ICD-10-CM | POA: Diagnosis not present

## 2023-06-13 DIAGNOSIS — I10 Essential (primary) hypertension: Secondary | ICD-10-CM | POA: Diagnosis not present

## 2023-06-13 DIAGNOSIS — F03B3 Unspecified dementia, moderate, with mood disturbance: Secondary | ICD-10-CM | POA: Diagnosis not present

## 2023-06-13 DIAGNOSIS — F329 Major depressive disorder, single episode, unspecified: Secondary | ICD-10-CM | POA: Diagnosis not present

## 2023-06-13 DIAGNOSIS — F05 Delirium due to known physiological condition: Secondary | ICD-10-CM | POA: Diagnosis not present

## 2023-06-13 DIAGNOSIS — F03B18 Unspecified dementia, moderate, with other behavioral disturbance: Secondary | ICD-10-CM | POA: Diagnosis not present

## 2023-06-15 DIAGNOSIS — H401131 Primary open-angle glaucoma, bilateral, mild stage: Secondary | ICD-10-CM | POA: Diagnosis not present

## 2023-06-15 DIAGNOSIS — F03B3 Unspecified dementia, moderate, with mood disturbance: Secondary | ICD-10-CM | POA: Diagnosis not present

## 2023-06-15 DIAGNOSIS — E1122 Type 2 diabetes mellitus with diabetic chronic kidney disease: Secondary | ICD-10-CM | POA: Diagnosis not present

## 2023-06-15 DIAGNOSIS — Z6835 Body mass index (BMI) 35.0-35.9, adult: Secondary | ICD-10-CM | POA: Diagnosis not present

## 2023-06-15 DIAGNOSIS — I491 Atrial premature depolarization: Secondary | ICD-10-CM | POA: Diagnosis not present

## 2023-06-15 DIAGNOSIS — F03B18 Unspecified dementia, moderate, with other behavioral disturbance: Secondary | ICD-10-CM | POA: Diagnosis not present

## 2023-06-15 DIAGNOSIS — M545 Low back pain, unspecified: Secondary | ICD-10-CM | POA: Diagnosis not present

## 2023-06-15 DIAGNOSIS — Z9181 History of falling: Secondary | ICD-10-CM | POA: Diagnosis not present

## 2023-06-15 DIAGNOSIS — K219 Gastro-esophageal reflux disease without esophagitis: Secondary | ICD-10-CM | POA: Diagnosis not present

## 2023-06-15 DIAGNOSIS — E559 Vitamin D deficiency, unspecified: Secondary | ICD-10-CM | POA: Diagnosis not present

## 2023-06-15 DIAGNOSIS — E7800 Pure hypercholesterolemia, unspecified: Secondary | ICD-10-CM | POA: Diagnosis not present

## 2023-06-15 DIAGNOSIS — F05 Delirium due to known physiological condition: Secondary | ICD-10-CM | POA: Diagnosis not present

## 2023-06-15 DIAGNOSIS — F329 Major depressive disorder, single episode, unspecified: Secondary | ICD-10-CM | POA: Diagnosis not present

## 2023-06-15 DIAGNOSIS — K269 Duodenal ulcer, unspecified as acute or chronic, without hemorrhage or perforation: Secondary | ICD-10-CM | POA: Diagnosis not present

## 2023-06-15 DIAGNOSIS — N189 Chronic kidney disease, unspecified: Secondary | ICD-10-CM | POA: Diagnosis not present

## 2023-06-15 DIAGNOSIS — I10 Essential (primary) hypertension: Secondary | ICD-10-CM | POA: Diagnosis not present

## 2023-06-15 DIAGNOSIS — D631 Anemia in chronic kidney disease: Secondary | ICD-10-CM | POA: Diagnosis not present

## 2023-06-15 DIAGNOSIS — H9190 Unspecified hearing loss, unspecified ear: Secondary | ICD-10-CM | POA: Diagnosis not present

## 2023-06-15 DIAGNOSIS — E669 Obesity, unspecified: Secondary | ICD-10-CM | POA: Diagnosis not present

## 2023-06-15 DIAGNOSIS — Z7984 Long term (current) use of oral hypoglycemic drugs: Secondary | ICD-10-CM | POA: Diagnosis not present

## 2023-06-15 DIAGNOSIS — Q6100 Congenital renal cyst, unspecified: Secondary | ICD-10-CM | POA: Diagnosis not present

## 2023-06-15 DIAGNOSIS — D509 Iron deficiency anemia, unspecified: Secondary | ICD-10-CM | POA: Diagnosis not present

## 2023-06-18 DIAGNOSIS — I10 Essential (primary) hypertension: Secondary | ICD-10-CM | POA: Diagnosis not present

## 2023-06-18 DIAGNOSIS — F03B18 Unspecified dementia, moderate, with other behavioral disturbance: Secondary | ICD-10-CM | POA: Diagnosis not present

## 2023-06-18 DIAGNOSIS — F05 Delirium due to known physiological condition: Secondary | ICD-10-CM | POA: Diagnosis not present

## 2023-06-18 DIAGNOSIS — F329 Major depressive disorder, single episode, unspecified: Secondary | ICD-10-CM | POA: Diagnosis not present

## 2023-06-18 DIAGNOSIS — M545 Low back pain, unspecified: Secondary | ICD-10-CM | POA: Diagnosis not present

## 2023-06-18 DIAGNOSIS — F03B3 Unspecified dementia, moderate, with mood disturbance: Secondary | ICD-10-CM | POA: Diagnosis not present

## 2023-06-20 DIAGNOSIS — R059 Cough, unspecified: Secondary | ICD-10-CM | POA: Diagnosis not present

## 2023-06-20 DIAGNOSIS — Z20822 Contact with and (suspected) exposure to covid-19: Secondary | ICD-10-CM | POA: Diagnosis not present

## 2023-06-20 DIAGNOSIS — R0602 Shortness of breath: Secondary | ICD-10-CM | POA: Diagnosis not present

## 2023-06-25 DIAGNOSIS — F03B18 Unspecified dementia, moderate, with other behavioral disturbance: Secondary | ICD-10-CM | POA: Diagnosis not present

## 2023-06-25 DIAGNOSIS — I10 Essential (primary) hypertension: Secondary | ICD-10-CM | POA: Diagnosis not present

## 2023-06-25 DIAGNOSIS — F05 Delirium due to known physiological condition: Secondary | ICD-10-CM | POA: Diagnosis not present

## 2023-06-25 DIAGNOSIS — F03B3 Unspecified dementia, moderate, with mood disturbance: Secondary | ICD-10-CM | POA: Diagnosis not present

## 2023-06-25 DIAGNOSIS — F329 Major depressive disorder, single episode, unspecified: Secondary | ICD-10-CM | POA: Diagnosis not present

## 2023-06-25 DIAGNOSIS — M545 Low back pain, unspecified: Secondary | ICD-10-CM | POA: Diagnosis not present

## 2023-06-27 DIAGNOSIS — I10 Essential (primary) hypertension: Secondary | ICD-10-CM | POA: Diagnosis not present

## 2023-06-27 DIAGNOSIS — F03B3 Unspecified dementia, moderate, with mood disturbance: Secondary | ICD-10-CM | POA: Diagnosis not present

## 2023-06-27 DIAGNOSIS — F05 Delirium due to known physiological condition: Secondary | ICD-10-CM | POA: Diagnosis not present

## 2023-06-27 DIAGNOSIS — F329 Major depressive disorder, single episode, unspecified: Secondary | ICD-10-CM | POA: Diagnosis not present

## 2023-06-27 DIAGNOSIS — M545 Low back pain, unspecified: Secondary | ICD-10-CM | POA: Diagnosis not present

## 2023-06-27 DIAGNOSIS — F03B18 Unspecified dementia, moderate, with other behavioral disturbance: Secondary | ICD-10-CM | POA: Diagnosis not present

## 2023-07-02 DIAGNOSIS — F329 Major depressive disorder, single episode, unspecified: Secondary | ICD-10-CM | POA: Diagnosis not present

## 2023-07-02 DIAGNOSIS — F05 Delirium due to known physiological condition: Secondary | ICD-10-CM | POA: Diagnosis not present

## 2023-07-02 DIAGNOSIS — F03B18 Unspecified dementia, moderate, with other behavioral disturbance: Secondary | ICD-10-CM | POA: Diagnosis not present

## 2023-07-02 DIAGNOSIS — M545 Low back pain, unspecified: Secondary | ICD-10-CM | POA: Diagnosis not present

## 2023-07-02 DIAGNOSIS — F03B3 Unspecified dementia, moderate, with mood disturbance: Secondary | ICD-10-CM | POA: Diagnosis not present

## 2023-07-02 DIAGNOSIS — I10 Essential (primary) hypertension: Secondary | ICD-10-CM | POA: Diagnosis not present

## 2023-07-08 DIAGNOSIS — D49512 Neoplasm of unspecified behavior of left kidney: Secondary | ICD-10-CM | POA: Diagnosis not present

## 2023-07-08 DIAGNOSIS — D4102 Neoplasm of uncertain behavior of left kidney: Secondary | ICD-10-CM | POA: Diagnosis not present

## 2023-07-11 DIAGNOSIS — Z20822 Contact with and (suspected) exposure to covid-19: Secondary | ICD-10-CM | POA: Diagnosis not present

## 2023-07-11 DIAGNOSIS — F0390 Unspecified dementia without behavioral disturbance: Secondary | ICD-10-CM | POA: Diagnosis not present

## 2023-07-11 DIAGNOSIS — R051 Acute cough: Secondary | ICD-10-CM | POA: Diagnosis not present

## 2023-07-11 DIAGNOSIS — J029 Acute pharyngitis, unspecified: Secondary | ICD-10-CM | POA: Diagnosis not present

## 2023-08-12 DIAGNOSIS — E1122 Type 2 diabetes mellitus with diabetic chronic kidney disease: Secondary | ICD-10-CM | POA: Diagnosis not present

## 2023-08-12 DIAGNOSIS — F322 Major depressive disorder, single episode, severe without psychotic features: Secondary | ICD-10-CM | POA: Diagnosis not present

## 2023-08-12 DIAGNOSIS — M479 Spondylosis, unspecified: Secondary | ICD-10-CM | POA: Diagnosis not present

## 2023-08-12 DIAGNOSIS — F03A18 Unspecified dementia, mild, with other behavioral disturbance: Secondary | ICD-10-CM | POA: Diagnosis not present

## 2023-08-12 DIAGNOSIS — I10 Essential (primary) hypertension: Secondary | ICD-10-CM | POA: Diagnosis not present

## 2023-08-17 DIAGNOSIS — E7800 Pure hypercholesterolemia, unspecified: Secondary | ICD-10-CM | POA: Diagnosis not present

## 2023-08-17 DIAGNOSIS — N281 Cyst of kidney, acquired: Secondary | ICD-10-CM | POA: Diagnosis not present

## 2023-08-17 DIAGNOSIS — E669 Obesity, unspecified: Secondary | ICD-10-CM | POA: Diagnosis not present

## 2023-08-17 DIAGNOSIS — E1122 Type 2 diabetes mellitus with diabetic chronic kidney disease: Secondary | ICD-10-CM | POA: Diagnosis not present

## 2023-08-17 DIAGNOSIS — E559 Vitamin D deficiency, unspecified: Secondary | ICD-10-CM | POA: Diagnosis not present

## 2023-08-17 DIAGNOSIS — H401131 Primary open-angle glaucoma, bilateral, mild stage: Secondary | ICD-10-CM | POA: Diagnosis not present

## 2023-08-17 DIAGNOSIS — I10 Essential (primary) hypertension: Secondary | ICD-10-CM | POA: Diagnosis not present

## 2023-08-17 DIAGNOSIS — Z7722 Contact with and (suspected) exposure to environmental tobacco smoke (acute) (chronic): Secondary | ICD-10-CM | POA: Diagnosis not present

## 2023-08-17 DIAGNOSIS — M47817 Spondylosis without myelopathy or radiculopathy, lumbosacral region: Secondary | ICD-10-CM | POA: Diagnosis not present

## 2023-08-17 DIAGNOSIS — F03A3 Unspecified dementia, mild, with mood disturbance: Secondary | ICD-10-CM | POA: Diagnosis not present

## 2023-08-17 DIAGNOSIS — M47816 Spondylosis without myelopathy or radiculopathy, lumbar region: Secondary | ICD-10-CM | POA: Diagnosis not present

## 2023-08-17 DIAGNOSIS — Z9181 History of falling: Secondary | ICD-10-CM | POA: Diagnosis not present

## 2023-08-17 DIAGNOSIS — F03A18 Unspecified dementia, mild, with other behavioral disturbance: Secondary | ICD-10-CM | POA: Diagnosis not present

## 2023-08-17 DIAGNOSIS — K219 Gastro-esophageal reflux disease without esophagitis: Secondary | ICD-10-CM | POA: Diagnosis not present

## 2023-08-17 DIAGNOSIS — D509 Iron deficiency anemia, unspecified: Secondary | ICD-10-CM | POA: Diagnosis not present

## 2023-08-17 DIAGNOSIS — Z7984 Long term (current) use of oral hypoglycemic drugs: Secondary | ICD-10-CM | POA: Diagnosis not present

## 2023-08-17 DIAGNOSIS — Z6835 Body mass index (BMI) 35.0-35.9, adult: Secondary | ICD-10-CM | POA: Diagnosis not present

## 2023-08-17 DIAGNOSIS — F05 Delirium due to known physiological condition: Secondary | ICD-10-CM | POA: Diagnosis not present

## 2023-08-17 DIAGNOSIS — I491 Atrial premature depolarization: Secondary | ICD-10-CM | POA: Diagnosis not present

## 2023-08-17 DIAGNOSIS — M47818 Spondylosis without myelopathy or radiculopathy, sacral and sacrococcygeal region: Secondary | ICD-10-CM | POA: Diagnosis not present

## 2023-08-17 DIAGNOSIS — H9190 Unspecified hearing loss, unspecified ear: Secondary | ICD-10-CM | POA: Diagnosis not present

## 2023-08-17 DIAGNOSIS — N189 Chronic kidney disease, unspecified: Secondary | ICD-10-CM | POA: Diagnosis not present

## 2023-08-17 DIAGNOSIS — F329 Major depressive disorder, single episode, unspecified: Secondary | ICD-10-CM | POA: Diagnosis not present

## 2023-08-17 DIAGNOSIS — D631 Anemia in chronic kidney disease: Secondary | ICD-10-CM | POA: Diagnosis not present

## 2023-08-19 ENCOUNTER — Other Ambulatory Visit: Payer: Self-pay | Admitting: Urology

## 2023-08-19 DIAGNOSIS — D4102 Neoplasm of uncertain behavior of left kidney: Secondary | ICD-10-CM

## 2023-08-20 DIAGNOSIS — M47816 Spondylosis without myelopathy or radiculopathy, lumbar region: Secondary | ICD-10-CM | POA: Diagnosis not present

## 2023-08-20 DIAGNOSIS — F03A18 Unspecified dementia, mild, with other behavioral disturbance: Secondary | ICD-10-CM | POA: Diagnosis not present

## 2023-08-20 DIAGNOSIS — F329 Major depressive disorder, single episode, unspecified: Secondary | ICD-10-CM | POA: Diagnosis not present

## 2023-08-20 DIAGNOSIS — M47817 Spondylosis without myelopathy or radiculopathy, lumbosacral region: Secondary | ICD-10-CM | POA: Diagnosis not present

## 2023-08-20 DIAGNOSIS — F03A3 Unspecified dementia, mild, with mood disturbance: Secondary | ICD-10-CM | POA: Diagnosis not present

## 2023-08-20 DIAGNOSIS — F05 Delirium due to known physiological condition: Secondary | ICD-10-CM | POA: Diagnosis not present

## 2023-08-22 DIAGNOSIS — F05 Delirium due to known physiological condition: Secondary | ICD-10-CM | POA: Diagnosis not present

## 2023-08-22 DIAGNOSIS — F03A18 Unspecified dementia, mild, with other behavioral disturbance: Secondary | ICD-10-CM | POA: Diagnosis not present

## 2023-08-22 DIAGNOSIS — F329 Major depressive disorder, single episode, unspecified: Secondary | ICD-10-CM | POA: Diagnosis not present

## 2023-08-22 DIAGNOSIS — M47817 Spondylosis without myelopathy or radiculopathy, lumbosacral region: Secondary | ICD-10-CM | POA: Diagnosis not present

## 2023-08-22 DIAGNOSIS — M47816 Spondylosis without myelopathy or radiculopathy, lumbar region: Secondary | ICD-10-CM | POA: Diagnosis not present

## 2023-08-22 DIAGNOSIS — F03A3 Unspecified dementia, mild, with mood disturbance: Secondary | ICD-10-CM | POA: Diagnosis not present

## 2023-08-27 DIAGNOSIS — F329 Major depressive disorder, single episode, unspecified: Secondary | ICD-10-CM | POA: Diagnosis not present

## 2023-08-27 DIAGNOSIS — F03A18 Unspecified dementia, mild, with other behavioral disturbance: Secondary | ICD-10-CM | POA: Diagnosis not present

## 2023-08-27 DIAGNOSIS — F03A3 Unspecified dementia, mild, with mood disturbance: Secondary | ICD-10-CM | POA: Diagnosis not present

## 2023-08-27 DIAGNOSIS — M47816 Spondylosis without myelopathy or radiculopathy, lumbar region: Secondary | ICD-10-CM | POA: Diagnosis not present

## 2023-08-27 DIAGNOSIS — F05 Delirium due to known physiological condition: Secondary | ICD-10-CM | POA: Diagnosis not present

## 2023-08-27 DIAGNOSIS — M47817 Spondylosis without myelopathy or radiculopathy, lumbosacral region: Secondary | ICD-10-CM | POA: Diagnosis not present

## 2023-08-29 DIAGNOSIS — F05 Delirium due to known physiological condition: Secondary | ICD-10-CM | POA: Diagnosis not present

## 2023-08-29 DIAGNOSIS — M47816 Spondylosis without myelopathy or radiculopathy, lumbar region: Secondary | ICD-10-CM | POA: Diagnosis not present

## 2023-08-29 DIAGNOSIS — M47817 Spondylosis without myelopathy or radiculopathy, lumbosacral region: Secondary | ICD-10-CM | POA: Diagnosis not present

## 2023-08-29 DIAGNOSIS — F03A18 Unspecified dementia, mild, with other behavioral disturbance: Secondary | ICD-10-CM | POA: Diagnosis not present

## 2023-08-29 DIAGNOSIS — F03A3 Unspecified dementia, mild, with mood disturbance: Secondary | ICD-10-CM | POA: Diagnosis not present

## 2023-08-29 DIAGNOSIS — F329 Major depressive disorder, single episode, unspecified: Secondary | ICD-10-CM | POA: Diagnosis not present

## 2023-09-02 DIAGNOSIS — Z9849 Cataract extraction status, unspecified eye: Secondary | ICD-10-CM | POA: Diagnosis not present

## 2023-09-02 DIAGNOSIS — Z961 Presence of intraocular lens: Secondary | ICD-10-CM | POA: Diagnosis not present

## 2023-09-02 DIAGNOSIS — H53143 Visual discomfort, bilateral: Secondary | ICD-10-CM | POA: Diagnosis not present

## 2023-09-03 DIAGNOSIS — F329 Major depressive disorder, single episode, unspecified: Secondary | ICD-10-CM | POA: Diagnosis not present

## 2023-09-03 DIAGNOSIS — F03A3 Unspecified dementia, mild, with mood disturbance: Secondary | ICD-10-CM | POA: Diagnosis not present

## 2023-09-03 DIAGNOSIS — M47817 Spondylosis without myelopathy or radiculopathy, lumbosacral region: Secondary | ICD-10-CM | POA: Diagnosis not present

## 2023-09-03 DIAGNOSIS — M47816 Spondylosis without myelopathy or radiculopathy, lumbar region: Secondary | ICD-10-CM | POA: Diagnosis not present

## 2023-09-03 DIAGNOSIS — F03A18 Unspecified dementia, mild, with other behavioral disturbance: Secondary | ICD-10-CM | POA: Diagnosis not present

## 2023-09-03 DIAGNOSIS — F05 Delirium due to known physiological condition: Secondary | ICD-10-CM | POA: Diagnosis not present

## 2023-09-05 DIAGNOSIS — M47816 Spondylosis without myelopathy or radiculopathy, lumbar region: Secondary | ICD-10-CM | POA: Diagnosis not present

## 2023-09-05 DIAGNOSIS — F03A18 Unspecified dementia, mild, with other behavioral disturbance: Secondary | ICD-10-CM | POA: Diagnosis not present

## 2023-09-05 DIAGNOSIS — F05 Delirium due to known physiological condition: Secondary | ICD-10-CM | POA: Diagnosis not present

## 2023-09-05 DIAGNOSIS — F03A3 Unspecified dementia, mild, with mood disturbance: Secondary | ICD-10-CM | POA: Diagnosis not present

## 2023-09-05 DIAGNOSIS — M47817 Spondylosis without myelopathy or radiculopathy, lumbosacral region: Secondary | ICD-10-CM | POA: Diagnosis not present

## 2023-09-05 DIAGNOSIS — F329 Major depressive disorder, single episode, unspecified: Secondary | ICD-10-CM | POA: Diagnosis not present

## 2023-09-06 ENCOUNTER — Other Ambulatory Visit: Payer: Self-pay

## 2023-09-06 DIAGNOSIS — C7A090 Malignant carcinoid tumor of the bronchus and lung: Secondary | ICD-10-CM

## 2023-09-09 ENCOUNTER — Other Ambulatory Visit: Payer: Self-pay | Admitting: Physician Assistant

## 2023-09-09 ENCOUNTER — Telehealth: Payer: Self-pay

## 2023-09-09 ENCOUNTER — Telehealth: Payer: Self-pay | Admitting: Medical Oncology

## 2023-09-09 ENCOUNTER — Ambulatory Visit (HOSPITAL_COMMUNITY)
Admission: RE | Admit: 2023-09-09 | Discharge: 2023-09-09 | Disposition: A | Discharge status: Home / Self Care | Payer: Medicare Other | Source: Ambulatory Visit | Attending: Physician Assistant | Admitting: Physician Assistant

## 2023-09-09 ENCOUNTER — Inpatient Hospital Stay: Payer: Medicare Other | Attending: Physician Assistant

## 2023-09-09 DIAGNOSIS — C7A090 Malignant carcinoid tumor of the bronchus and lung: Secondary | ICD-10-CM | POA: Insufficient documentation

## 2023-09-09 DIAGNOSIS — I700 Atherosclerosis of aorta: Secondary | ICD-10-CM | POA: Diagnosis not present

## 2023-09-09 DIAGNOSIS — I2699 Other pulmonary embolism without acute cor pulmonale: Secondary | ICD-10-CM

## 2023-09-09 DIAGNOSIS — C7A8 Other malignant neuroendocrine tumors: Secondary | ICD-10-CM | POA: Diagnosis not present

## 2023-09-09 DIAGNOSIS — C7A00 Malignant carcinoid tumor of unspecified site: Secondary | ICD-10-CM | POA: Diagnosis not present

## 2023-09-09 DIAGNOSIS — N289 Disorder of kidney and ureter, unspecified: Secondary | ICD-10-CM | POA: Insufficient documentation

## 2023-09-09 DIAGNOSIS — R918 Other nonspecific abnormal finding of lung field: Secondary | ICD-10-CM | POA: Diagnosis not present

## 2023-09-09 LAB — CBC WITH DIFFERENTIAL (CANCER CENTER ONLY)
Abs Immature Granulocytes: 0.02 10*3/uL (ref 0.00–0.07)
Basophils Absolute: 0.1 10*3/uL (ref 0.0–0.1)
Basophils Relative: 1 %
Eosinophils Absolute: 0.3 10*3/uL (ref 0.0–0.5)
Eosinophils Relative: 4 %
HCT: 41.8 % (ref 36.0–46.0)
Hemoglobin: 14.3 g/dL (ref 12.0–15.0)
Immature Granulocytes: 0 %
Lymphocytes Relative: 24 %
Lymphs Abs: 2 10*3/uL (ref 0.7–4.0)
MCH: 32.1 pg (ref 26.0–34.0)
MCHC: 34.2 g/dL (ref 30.0–36.0)
MCV: 93.7 fL (ref 80.0–100.0)
Monocytes Absolute: 0.7 10*3/uL (ref 0.1–1.0)
Monocytes Relative: 8 %
Neutro Abs: 5.1 10*3/uL (ref 1.7–7.7)
Neutrophils Relative %: 63 %
Platelet Count: 248 10*3/uL (ref 150–400)
RBC: 4.46 MIL/uL (ref 3.87–5.11)
RDW: 12.5 % (ref 11.5–15.5)
WBC Count: 8.2 10*3/uL (ref 4.0–10.5)
nRBC: 0 % (ref 0.0–0.2)

## 2023-09-09 LAB — CMP (CANCER CENTER ONLY)
ALT: 21 U/L (ref 0–44)
AST: 20 U/L (ref 15–41)
Albumin: 4.2 g/dL (ref 3.5–5.0)
Alkaline Phosphatase: 79 U/L (ref 38–126)
Anion gap: 10 (ref 5–15)
BUN: 23 mg/dL (ref 8–23)
CO2: 25 mmol/L (ref 22–32)
Calcium: 9.4 mg/dL (ref 8.9–10.3)
Chloride: 104 mmol/L (ref 98–111)
Creatinine: 1.13 mg/dL — ABNORMAL HIGH (ref 0.44–1.00)
GFR, Estimated: 50 mL/min — ABNORMAL LOW (ref >60)
Glucose, Bld: 227 mg/dL — ABNORMAL HIGH (ref 70–99)
Potassium: 4.3 mmol/L (ref 3.5–5.1)
Sodium: 139 mmol/L (ref 135–145)
Total Bilirubin: 0.7 mg/dL (ref 0.0–1.2)
Total Protein: 6.9 g/dL (ref 6.5–8.1)

## 2023-09-09 MED ORDER — APIXABAN (ELIQUIS) VTE STARTER PACK (10MG AND 5MG): ORAL_TABLET | ORAL | 0 refills | Status: DC

## 2023-09-09 MED ORDER — SODIUM CHLORIDE (PF) 0.9 % IJ SOLN
INTRAMUSCULAR | Status: AC
  Filled 2023-09-09: qty 50

## 2023-09-09 MED ORDER — IOHEXOL 300 MG/ML  SOLN
75.0000 mL | Freq: Once | INTRAMUSCULAR | Status: AC | PRN
  Administered 2023-09-09: 75 mL via INTRAVENOUS

## 2023-09-09 NOTE — Telephone Encounter (Signed)
 CT called report- "No evidence of new or progressive metastatic disease... 3. Possible small subacute pulmonary embolus within a segmental pulmonary arterial branch of the right upper lobe. No larger, more central or occlusive emboli are demonstrated. This finding is of questionable clinical significance. Correlate with symptomatology. Lower extremity venous Doppler ultrasound could be performed to help guide treatment".

## 2023-09-09 NOTE — Telephone Encounter (Signed)
 CT called report-No evidence of new or progressive metastatic disease. 3. Possible small subacute pulmonary embolus within a segmental pulmonary arterial branch of the right upper lobe. No larger, more central or occlusive emboli are demonstrated. This finding is of questionable clinical significance. Correlate with symptomatology. Lower extremity venous Doppler ultrasound could be performed to help guide treatment.  Cassandra L Heilingoetter, PA-C Ok I sent the eliquis. can you please review this medication and tell her to start this. Dr. Arbutus Ped is thinking 3 months or so of being on this. when she gets lot on the starter pack, she needs to call to avoid lapses in the dose. Can review bleeding and to avoid NSAIDS. Please review s/sx and make sure she knows if she gets SOB/CP, lightheaded etc to go to ER.   Pt's daughter advised of CT results and recommendations.  She voiced understanding and will call with questions and concerns.

## 2023-09-10 DIAGNOSIS — F03A18 Unspecified dementia, mild, with other behavioral disturbance: Secondary | ICD-10-CM | POA: Diagnosis not present

## 2023-09-10 DIAGNOSIS — F03A3 Unspecified dementia, mild, with mood disturbance: Secondary | ICD-10-CM | POA: Diagnosis not present

## 2023-09-10 DIAGNOSIS — M47817 Spondylosis without myelopathy or radiculopathy, lumbosacral region: Secondary | ICD-10-CM | POA: Diagnosis not present

## 2023-09-10 DIAGNOSIS — F05 Delirium due to known physiological condition: Secondary | ICD-10-CM | POA: Diagnosis not present

## 2023-09-10 DIAGNOSIS — F329 Major depressive disorder, single episode, unspecified: Secondary | ICD-10-CM | POA: Diagnosis not present

## 2023-09-10 DIAGNOSIS — M47816 Spondylosis without myelopathy or radiculopathy, lumbar region: Secondary | ICD-10-CM | POA: Diagnosis not present

## 2023-09-16 DIAGNOSIS — F03A3 Unspecified dementia, mild, with mood disturbance: Secondary | ICD-10-CM | POA: Diagnosis not present

## 2023-09-16 DIAGNOSIS — E559 Vitamin D deficiency, unspecified: Secondary | ICD-10-CM | POA: Diagnosis not present

## 2023-09-16 DIAGNOSIS — F03A18 Unspecified dementia, mild, with other behavioral disturbance: Secondary | ICD-10-CM | POA: Diagnosis not present

## 2023-09-16 DIAGNOSIS — H401131 Primary open-angle glaucoma, bilateral, mild stage: Secondary | ICD-10-CM | POA: Diagnosis not present

## 2023-09-16 DIAGNOSIS — Z7722 Contact with and (suspected) exposure to environmental tobacco smoke (acute) (chronic): Secondary | ICD-10-CM | POA: Diagnosis not present

## 2023-09-16 DIAGNOSIS — I10 Essential (primary) hypertension: Secondary | ICD-10-CM | POA: Diagnosis not present

## 2023-09-16 DIAGNOSIS — K219 Gastro-esophageal reflux disease without esophagitis: Secondary | ICD-10-CM | POA: Diagnosis not present

## 2023-09-16 DIAGNOSIS — Z9181 History of falling: Secondary | ICD-10-CM | POA: Diagnosis not present

## 2023-09-16 DIAGNOSIS — E1122 Type 2 diabetes mellitus with diabetic chronic kidney disease: Secondary | ICD-10-CM | POA: Diagnosis not present

## 2023-09-16 DIAGNOSIS — N189 Chronic kidney disease, unspecified: Secondary | ICD-10-CM | POA: Diagnosis not present

## 2023-09-16 DIAGNOSIS — Z6835 Body mass index (BMI) 35.0-35.9, adult: Secondary | ICD-10-CM | POA: Diagnosis not present

## 2023-09-16 DIAGNOSIS — H9190 Unspecified hearing loss, unspecified ear: Secondary | ICD-10-CM | POA: Diagnosis not present

## 2023-09-16 DIAGNOSIS — E669 Obesity, unspecified: Secondary | ICD-10-CM | POA: Diagnosis not present

## 2023-09-16 DIAGNOSIS — M47816 Spondylosis without myelopathy or radiculopathy, lumbar region: Secondary | ICD-10-CM | POA: Diagnosis not present

## 2023-09-16 DIAGNOSIS — Z7984 Long term (current) use of oral hypoglycemic drugs: Secondary | ICD-10-CM | POA: Diagnosis not present

## 2023-09-16 DIAGNOSIS — D509 Iron deficiency anemia, unspecified: Secondary | ICD-10-CM | POA: Diagnosis not present

## 2023-09-16 DIAGNOSIS — D631 Anemia in chronic kidney disease: Secondary | ICD-10-CM | POA: Diagnosis not present

## 2023-09-16 DIAGNOSIS — N281 Cyst of kidney, acquired: Secondary | ICD-10-CM | POA: Diagnosis not present

## 2023-09-16 DIAGNOSIS — E7800 Pure hypercholesterolemia, unspecified: Secondary | ICD-10-CM | POA: Diagnosis not present

## 2023-09-16 DIAGNOSIS — F329 Major depressive disorder, single episode, unspecified: Secondary | ICD-10-CM | POA: Diagnosis not present

## 2023-09-16 DIAGNOSIS — M47818 Spondylosis without myelopathy or radiculopathy, sacral and sacrococcygeal region: Secondary | ICD-10-CM | POA: Diagnosis not present

## 2023-09-16 DIAGNOSIS — I491 Atrial premature depolarization: Secondary | ICD-10-CM | POA: Diagnosis not present

## 2023-09-16 DIAGNOSIS — F05 Delirium due to known physiological condition: Secondary | ICD-10-CM | POA: Diagnosis not present

## 2023-09-16 DIAGNOSIS — M47817 Spondylosis without myelopathy or radiculopathy, lumbosacral region: Secondary | ICD-10-CM | POA: Diagnosis not present

## 2023-09-17 ENCOUNTER — Encounter: Payer: Self-pay | Admitting: Internal Medicine

## 2023-09-18 ENCOUNTER — Other Ambulatory Visit: Payer: Self-pay | Admitting: Cardiovascular Disease

## 2023-09-18 DIAGNOSIS — F03A3 Unspecified dementia, mild, with mood disturbance: Secondary | ICD-10-CM | POA: Diagnosis not present

## 2023-09-18 DIAGNOSIS — F03A18 Unspecified dementia, mild, with other behavioral disturbance: Secondary | ICD-10-CM | POA: Diagnosis not present

## 2023-09-18 DIAGNOSIS — M47817 Spondylosis without myelopathy or radiculopathy, lumbosacral region: Secondary | ICD-10-CM | POA: Diagnosis not present

## 2023-09-18 DIAGNOSIS — F05 Delirium due to known physiological condition: Secondary | ICD-10-CM | POA: Diagnosis not present

## 2023-09-18 DIAGNOSIS — F329 Major depressive disorder, single episode, unspecified: Secondary | ICD-10-CM | POA: Diagnosis not present

## 2023-09-18 DIAGNOSIS — M47816 Spondylosis without myelopathy or radiculopathy, lumbar region: Secondary | ICD-10-CM | POA: Diagnosis not present

## 2023-09-19 ENCOUNTER — Inpatient Hospital Stay: Payer: Medicare Other | Attending: Physician Assistant | Admitting: Internal Medicine

## 2023-09-19 ENCOUNTER — Other Ambulatory Visit: Payer: Self-pay

## 2023-09-19 VITALS — BP 174/86 | HR 55 | Temp 97.9°F | Resp 18 | Wt 182.6 lb

## 2023-09-19 DIAGNOSIS — Z7901 Long term (current) use of anticoagulants: Secondary | ICD-10-CM | POA: Insufficient documentation

## 2023-09-19 DIAGNOSIS — Z86711 Personal history of pulmonary embolism: Secondary | ICD-10-CM | POA: Insufficient documentation

## 2023-09-19 DIAGNOSIS — N183 Chronic kidney disease, stage 3 unspecified: Secondary | ICD-10-CM | POA: Insufficient documentation

## 2023-09-19 DIAGNOSIS — I129 Hypertensive chronic kidney disease with stage 1 through stage 4 chronic kidney disease, or unspecified chronic kidney disease: Secondary | ICD-10-CM | POA: Insufficient documentation

## 2023-09-19 DIAGNOSIS — C7A090 Malignant carcinoid tumor of the bronchus and lung: Secondary | ICD-10-CM | POA: Insufficient documentation

## 2023-09-19 DIAGNOSIS — C3492 Malignant neoplasm of unspecified part of left bronchus or lung: Secondary | ICD-10-CM

## 2023-09-19 DIAGNOSIS — Z79899 Other long term (current) drug therapy: Secondary | ICD-10-CM | POA: Diagnosis not present

## 2023-09-19 NOTE — Progress Notes (Signed)
 [facility] [phone]   [phone]  OFFICE PROGRESS NOTE  Shon Hale, MD [address] [place] [address]  DIAGNOSIS:  1) Stage IV (T1b, N0, M1a) low-grade neuroendocrine carcinoma (carcinoid tumor), presented with innumerable bilateral pulmonary nodules diagnosed in March 2023. 2) incidental small subacute pulmonary embolus within a segmental pulmonary arterial branch of the right upper lobe on CT scan of the chest on September 09, 2023.  PRIOR THERAPY: None  CURRENT THERAPY: Eliquis 5 mg p.o. twice daily  INTERVAL HISTORY: Michaela Santana 78 y.o. female returns to the clinic today for follow-up visit accompanied by her daughter.Discussed the use of AI scribe software for clinical note transcription with the patient, who gave verbal consent to proceed.  History of Present Illness   Michaela Santana is a 78 year old female with stage four low grade neuroendocrine carcinoma who presents for evaluation and discussion of her scan results. She is accompanied by her daughter, Michaela Santana.  She has a history of stage four low grade neuroendocrine carcinoma, diagnosed in March 2023, with innumerable bilateral pulmonary nodules. She has been under observation since diagnosis. A recent restaging CT scan of the chest on September 09, 2023, revealed stable nodules with no growth or spread. She continues to be monitored for this condition.  A small subacute pulmonary embolus was identified in a segmental pulmonary arterial branch of the right upper lobe during the recent CT scan. She is currently on Eliquis for anticoagulation and has not experienced any symptoms related to the embolism.  She generally feels well with no new complaints of chest pain or breathing issues. No recent weight loss, night sweats, nausea, vomiting, or diarrhea. However, she experiences occasional headaches and lightheadedness, sometimes requiring assistance to bed due to  unsteadiness.       MEDICAL HISTORY: Past Medical History:  Diagnosis Date   Allergic rhinitis    Anemia    Arthritis    "probably in hands" (07/17/2016)   CKD (chronic kidney disease), stage III (HCC)    Depression    Esophageal reflux    Family history of adverse reaction to anesthesia    "her father had some issues when he had his gallbladder taken out when he was in his late 20s"   GERD (gastroesophageal reflux disease)    Glaucoma    open angle, bilateral   High cholesterol    History of kidney stones    Hypercholesterolemia    Hypertension    Kidney cysts    Lumbago with sciatica, right side    Sepsis (HCC) 07/2016   d/t flu   Type II diabetes mellitus (HCC)    Vitamin D deficiency     ALLERGIES:  is allergic to codeine and alprazolam.  MEDICATIONS:  Current Outpatient Medications  Medication Sig Dispense Refill   acetaminophen (TYLENOL) 650 MG CR tablet Take 650 mg by mouth every 8 (eight) hours as needed for pain.     albuterol (VENTOLIN HFA) 108 (90 Base) MCG/ACT inhaler Inhale into the lungs every 6 (six) hours as needed for wheezing or shortness of breath.     amLODipine (NORVASC) 5 MG tablet Take 5 mg by mouth daily.     APIXABAN (ELIQUIS) VTE STARTER PACK (10MG  AND 5MG ) Take as directed on package: start with two-5mg  tablets twice daily for 7 days. On day 8, switch to one-5mg  tablet twice daily. 74 each 0   Artificial Tear Solution (SYSTANE CONTACTS OP) Apply  0.4 drops to eye as needed.     Blood Glucose Monitoring Suppl (FREESTYLE LITE) DEVI USE UTD  0   calcium carbonate (TUMS - DOSED IN MG ELEMENTAL CALCIUM) 500 MG chewable tablet Chew 1 tablet by mouth daily.     DULoxetine HCl 40 MG CPEP Take 1 capsule by mouth daily.     ferrous sulfate 325 (65 FE) MG EC tablet Take 325 mg by mouth daily.     FREESTYLE LITE test strip USE TO CHECK FASTING GLUCOSE IN THE MORNING AND 2 HOURS AFTER DINNER OR LUNCH  3   furosemide (LASIX) 20 MG tablet Take 1 tablet (20 mg  total) by mouth daily as needed (swelling). 30 tablet 3   gabapentin (NEURONTIN) 300 MG capsule Take 300 mg by mouth every evening.     glipiZIDE (GLUCOTROL XL) 5 MG 24 hr tablet Take 5 mg by mouth daily with breakfast.     Lancets (FREESTYLE) lancets      latanoprost (XALATAN) 0.005 % ophthalmic solution Place 1 drop into the right eye at bedtime.     loratadine (CLARITIN) 10 MG tablet Take 10 mg by mouth daily as needed for allergies.     LORazepam (ATIVAN) 0.5 MG tablet Take 0.5 mg by mouth every 8 (eight) hours.     losartan (COZAAR) 100 MG tablet Take 0.5 tablets (50 mg total) by mouth daily. 30 tablet 1   magnesium oxide (MAG-OX) 400 (240 Mg) MG tablet Take 800 mg by mouth 2 (two) times daily.     ondansetron (ZOFRAN) 8 MG tablet Take 8 mg by mouth every 8 (eight) hours as needed for nausea/vomiting.     oxymetazoline (AFRIN) 0.05 % nasal spray Place 1 spray into both nostrils 2 (two) times daily.     pantoprazole (PROTONIX) 40 MG tablet Take 1 tablet (40 mg total) by mouth 2 (two) times daily before a meal. 120 tablet 0   QUEtiapine (SEROQUEL) 25 MG tablet Take 12.5 mg by mouth every other day.     vitamin B-12 (CYANOCOBALAMIN) 1000 MCG tablet Take 1 tablet (1,000 mcg total) by mouth daily. 30 tablet 0   No current facility-administered medications for this visit.    SURGICAL HISTORY:  Past Surgical History:  Procedure Laterality Date   BREAST BIOPSY  1990   "? side; it wasn't anything"   BRONCHIAL BIOPSY  08/01/2021   Procedure: BRONCHIAL BIOPSIES;  Surgeon: Josephine Igo, DO;  Location: [facility];  Service: Pulmonary;;   BRONCHIAL NEEDLE ASPIRATION BIOPSY  08/01/2021   Procedure: BRONCHIAL NEEDLE ASPIRATION BIOPSIES;  Surgeon: Josephine Igo, DO;  Location: [facility];  Service: Pulmonary;;   CATARACT EXTRACTION, BILATERAL  2021   CHOLECYSTECTOMY OPEN  1971   COLONOSCOPY     2001,2011,2023   ESOPHAGOGASTRODUODENOSCOPY (EGD) WITH PROPOFOL N/A 11/10/2022   Procedure:  ESOPHAGOGASTRODUODENOSCOPY (EGD) WITH PROPOFOL;  Surgeon: Willis Modena, MD;  Location: [facility] ENDOSCOPY;  Service: Gastroenterology;  Laterality: N/A;   TUBAL LIGATION  1978   VIDEO BRONCHOSCOPY WITH RADIAL ENDOBRONCHIAL ULTRASOUND  08/01/2021   Procedure: RADIAL ENDOBRONCHIAL ULTRASOUND;  Surgeon: Josephine Igo, DO;  Location: [facility];  Service: Pulmonary;;    REVIEW OF SYSTEMS:  A comprehensive review of systems was negative except for: Constitutional: positive for fatigue Musculoskeletal: positive for arthralgias   PHYSICAL EXAMINATION: General appearance: alert, cooperative, fatigued, and no distress Head: Normocephalic, without obvious abnormality, atraumatic Neck: no adenopathy, no JVD, supple, symmetrical, trachea midline, and thyroid not enlarged, symmetric, no tenderness/mass/nodules Lymph  nodes: Cervical, supraclavicular, and axillary nodes normal. Resp: clear to auscultation bilaterally Back: symmetric, no curvature. ROM normal. No CVA tenderness. Cardio: regular rate and rhythm, S1, S2 normal, no murmur, click, rub or gallop GI: soft, non-tender; bowel sounds normal; no masses,  no organomegaly Extremities: extremities normal, atraumatic, no cyanosis or edema  ECOG PERFORMANCE STATUS: 1 - Symptomatic but completely ambulatory  Blood pressure (!) 174/86, pulse (!) 55, temperature 97.9 F (36.6 C), temperature source Temporal, resp. rate 18, weight 182 lb 9.6 oz (82.8 kg), SpO2 98%.  LABORATORY DATA: Lab Results  Component Value Date   WBC 8.2 09/09/2023   HGB 14.3 09/09/2023   HCT 41.8 09/09/2023   MCV 93.7 09/09/2023   PLT 248 09/09/2023      Chemistry      Component Value Date/Time   NA 139 09/09/2023 1001   K 4.3 09/09/2023 1001   CL 104 09/09/2023 1001   CO2 25 09/09/2023 1001   BUN 23 09/09/2023 1001   CREATININE 1.13 (H) 09/09/2023 1001      Component Value Date/Time   CALCIUM 9.4 09/09/2023 1001   ALKPHOS 79 09/09/2023 1001   AST 20 09/09/2023  1001   ALT 21 09/09/2023 1001   BILITOT 0.7 09/09/2023 1001       RADIOGRAPHIC STUDIES: CT Chest W Contrast Result Date: 09/09/2023 CLINICAL DATA:  Neuroendocrine tumor. Stage IV carcinoid tumor, surveillance. * Tracking Code: BO * EXAM: CT CHEST WITH CONTRAST TECHNIQUE: Multidetector CT imaging of the chest was performed during intravenous contrast administration. RADIATION DOSE REDUCTION: This exam was performed according to the departmental dose-optimization program which includes automated exposure control, adjustment of the mA and/or kV according to patient size and/or use of iterative reconstruction technique. CONTRAST:  75mL OMNIPAQUE IOHEXOL 300 MG/ML  SOLN COMPARISON:  Chest CT 03/05/2023 and 08/31/2022. FINDINGS: Cardiovascular: Atherosclerosis of the aorta, great vessels and coronary arteries. No acute systemic arterial abnormalities are identified. There is suboptimal opacification of the pulmonary arteries. There is a possible small incomplete filling defect within a segmental pulmonary arterial branch of the right upper lobe (best seen on coronal image 76/6) which could reflect a subacute pulmonary embolus. No larger, more central or occlusive emboli are demonstrated. The heart size is normal. There is no pericardial effusion. Mediastinum/Nodes: There are no enlarged mediastinal, hilar or axillary lymph nodes.Moderate-sized hiatal hernia again noted. The thyroid gland and trachea appear unremarkable. Lungs/Pleura: No pleural effusion or pneumothorax. Again demonstrated are innumerable pulmonary nodules bilaterally consistent with metastatic disease. These have not significantly changed from the most recent prior study, with an index lesion in the left lower lobe measuring 1.0 x 1.1 cm on image 104/5. There is a left upper lobe nodule measuring 1.2 cm on image 52/5. No definite enlarging lesions identified. Stable atelectasis medially in the left lower lobe adjacent to the hiatal hernia.  Scattered mild pulmonary scarring. Upper abdomen: No acute findings are seen within the visualized upper abdomen. There is a patent steatosis. As on the most recent study, an ill-defined area of low-density is noted posteriorly in the left lobe measuring approximately 2.6 x 1.8 cm on image 127/2, not significantly changed from the most recent prior study and possibly reflecting focal fat. No new or enlarging liver lesions are identified. There is no adrenal mass. Musculoskeletal/Chest wall: There is no chest wall mass or suspicious osseous finding. Multilevel spondylosis. IMPRESSION: 1. Stable innumerable pulmonary nodules bilaterally consistent with metastatic disease or diffuse idiopathic pulmonary neuroendocrine cell hyperplasia. No definite enlarging  lesions identified. 2. No evidence of new or progressive metastatic disease. 3. Possible small subacute pulmonary embolus within a segmental pulmonary arterial branch of the right upper lobe. No larger, more central or occlusive emboli are demonstrated. This finding is of questionable clinical significance. Correlate with symptomatology. Lower extremity venous Doppler ultrasound could be performed to help guide treatment. 4. Stable ill-defined area of low-density posteriorly in the left lobe of the liver, possibly reflecting focal fat. No new or enlarging liver lesions identified. 5. Moderate-sized hiatal hernia. 6.  Aortic Atherosclerosis (ICD10-I70.0). 7. These results will be called to the ordering clinician or representative by the Radiologist Assistant, and communication documented in the PACS or [employer]. Electronically Signed   By: Carey Bullocks M.D.   On: 09/09/2023 13:08    ASSESSMENT AND PLAN: This is a very pleasant 78 years old white female recently diagnosed with a stage IV (T1b, N0, M1 a) low-grade neuroendocrine carcinoma (carcinoid tumor) presented with innumerable bilateral pulmonary nodules diagnosed in March 2023. The recent  CT-guided core biopsy of the left lung nodule was consistent with low-grade neuroendocrine carcinoma. The NCCN guideline would recommend continuous observation and monitoring for patient with bilateral pulmonary nodules consistent with a carcinoid tumor unless the patient is symptomatic then we would consider her for treatment with octreotide LAR to control her symptoms. The patient is currently on observation and she is feeling fine with no concerning complaints. She had repeat CT scan of the chest performed recently that showed no clear evidence for disease progression.    Stage IV low-grade neuroendocrine carcinoma (carcinoid tumor) Diagnosed in March 2023 with innumerable bilateral pulmonary nodules. Currently well-managed with no growth or metastasis as per recent imaging. Under observation since diagnosis.  Subacute pulmonary embolism Incidentally discovered on restaging CT scan of the chest on September 09, 2023, within a segmental pulmonary arterial branch of the right upper lobe. Asymptomatic and considered subacute, likely present for some time. Eliquis initiated to prevent progression and facilitate resolution. - Continue Eliquis for 3-6 months - Monitor for any worsening dyspnea or chest pain - Schedule follow-up scan in 6 months to confirm resolution  Hypertension Elevated blood pressure recorded during the visit, potentially due to anxiety related to the visit.   The patient was advised to call immediately if she has any other concerning symptoms in the interval. The patient voices understanding of current disease status and treatment options and is in agreement with the current care plan.  All questions were answered. The patient knows to call the clinic with any problems, questions or concerns. We can certainly see the patient much sooner if necessary.  The total time spent in the appointment was 20 minutes.  Disclaimer: This note was dictated with voice recognition software.  Similar sounding words can inadvertently be transcribed and may not be corrected upon review.

## 2023-09-24 DIAGNOSIS — F05 Delirium due to known physiological condition: Secondary | ICD-10-CM | POA: Diagnosis not present

## 2023-09-24 DIAGNOSIS — M47816 Spondylosis without myelopathy or radiculopathy, lumbar region: Secondary | ICD-10-CM | POA: Diagnosis not present

## 2023-09-24 DIAGNOSIS — F329 Major depressive disorder, single episode, unspecified: Secondary | ICD-10-CM | POA: Diagnosis not present

## 2023-09-24 DIAGNOSIS — F03A18 Unspecified dementia, mild, with other behavioral disturbance: Secondary | ICD-10-CM | POA: Diagnosis not present

## 2023-09-24 DIAGNOSIS — F03A3 Unspecified dementia, mild, with mood disturbance: Secondary | ICD-10-CM | POA: Diagnosis not present

## 2023-09-24 DIAGNOSIS — M47817 Spondylosis without myelopathy or radiculopathy, lumbosacral region: Secondary | ICD-10-CM | POA: Diagnosis not present

## 2023-10-02 DIAGNOSIS — Z86711 Personal history of pulmonary embolism: Secondary | ICD-10-CM | POA: Diagnosis not present

## 2023-10-02 DIAGNOSIS — I10 Essential (primary) hypertension: Secondary | ICD-10-CM | POA: Diagnosis not present

## 2023-10-02 DIAGNOSIS — R058 Other specified cough: Secondary | ICD-10-CM | POA: Diagnosis not present

## 2023-10-02 DIAGNOSIS — C7A8 Other malignant neuroendocrine tumors: Secondary | ICD-10-CM | POA: Diagnosis not present

## 2023-10-09 DIAGNOSIS — M47816 Spondylosis without myelopathy or radiculopathy, lumbar region: Secondary | ICD-10-CM | POA: Diagnosis not present

## 2023-10-09 DIAGNOSIS — F329 Major depressive disorder, single episode, unspecified: Secondary | ICD-10-CM | POA: Diagnosis not present

## 2023-10-09 DIAGNOSIS — M47817 Spondylosis without myelopathy or radiculopathy, lumbosacral region: Secondary | ICD-10-CM | POA: Diagnosis not present

## 2023-10-09 DIAGNOSIS — F05 Delirium due to known physiological condition: Secondary | ICD-10-CM | POA: Diagnosis not present

## 2023-10-09 DIAGNOSIS — F03A3 Unspecified dementia, mild, with mood disturbance: Secondary | ICD-10-CM | POA: Diagnosis not present

## 2023-10-09 DIAGNOSIS — F03A18 Unspecified dementia, mild, with other behavioral disturbance: Secondary | ICD-10-CM | POA: Diagnosis not present

## 2023-10-17 DIAGNOSIS — K269 Duodenal ulcer, unspecified as acute or chronic, without hemorrhage or perforation: Secondary | ICD-10-CM | POA: Diagnosis not present

## 2023-10-21 ENCOUNTER — Ambulatory Visit
Admission: RE | Admit: 2023-10-21 | Discharge: 2023-10-21 | Disposition: A | Discharge status: Home / Self Care | Source: Ambulatory Visit | Attending: Urology | Admitting: Urology

## 2023-10-21 DIAGNOSIS — D4102 Neoplasm of uncertain behavior of left kidney: Secondary | ICD-10-CM

## 2023-10-21 DIAGNOSIS — K449 Diaphragmatic hernia without obstruction or gangrene: Secondary | ICD-10-CM | POA: Diagnosis not present

## 2023-10-21 DIAGNOSIS — K760 Fatty (change of) liver, not elsewhere classified: Secondary | ICD-10-CM | POA: Diagnosis not present

## 2023-10-21 DIAGNOSIS — R93422 Abnormal radiologic findings on diagnostic imaging of left kidney: Secondary | ICD-10-CM | POA: Diagnosis not present

## 2023-10-21 MED ORDER — GADOPICLENOL 0.5 MMOL/ML IV SOLN
8.0000 mL | Freq: Once | INTRAVENOUS | Status: AC | PRN
  Administered 2023-10-21: 8 mL via INTRAVENOUS

## 2023-11-18 DIAGNOSIS — R338 Other retention of urine: Secondary | ICD-10-CM | POA: Diagnosis not present

## 2023-11-18 DIAGNOSIS — D4102 Neoplasm of uncertain behavior of left kidney: Secondary | ICD-10-CM | POA: Diagnosis not present

## 2023-11-27 ENCOUNTER — Encounter (HOSPITAL_BASED_OUTPATIENT_CLINIC_OR_DEPARTMENT_OTHER): Payer: Self-pay | Admitting: Cardiovascular Disease

## 2023-12-25 DIAGNOSIS — M5441 Lumbago with sciatica, right side: Secondary | ICD-10-CM | POA: Diagnosis not present

## 2023-12-25 DIAGNOSIS — F03B4 Unspecified dementia, moderate, with anxiety: Secondary | ICD-10-CM | POA: Diagnosis not present

## 2023-12-25 DIAGNOSIS — I10 Essential (primary) hypertension: Secondary | ICD-10-CM | POA: Diagnosis not present

## 2023-12-25 DIAGNOSIS — R2689 Other abnormalities of gait and mobility: Secondary | ICD-10-CM | POA: Diagnosis not present

## 2023-12-25 DIAGNOSIS — F322 Major depressive disorder, single episode, severe without psychotic features: Secondary | ICD-10-CM | POA: Diagnosis not present

## 2023-12-25 DIAGNOSIS — E1165 Type 2 diabetes mellitus with hyperglycemia: Secondary | ICD-10-CM | POA: Diagnosis not present

## 2023-12-30 DIAGNOSIS — N390 Urinary tract infection, site not specified: Secondary | ICD-10-CM | POA: Diagnosis not present

## 2024-01-08 DIAGNOSIS — M5441 Lumbago with sciatica, right side: Secondary | ICD-10-CM | POA: Diagnosis not present

## 2024-01-08 DIAGNOSIS — F03B3 Unspecified dementia, moderate, with mood disturbance: Secondary | ICD-10-CM | POA: Diagnosis not present

## 2024-01-10 ENCOUNTER — Other Ambulatory Visit: Payer: Self-pay | Admitting: Cardiovascular Disease

## 2024-01-14 DIAGNOSIS — M5441 Lumbago with sciatica, right side: Secondary | ICD-10-CM | POA: Diagnosis not present

## 2024-01-14 DIAGNOSIS — F03B3 Unspecified dementia, moderate, with mood disturbance: Secondary | ICD-10-CM | POA: Diagnosis not present

## 2024-01-16 DIAGNOSIS — M5441 Lumbago with sciatica, right side: Secondary | ICD-10-CM | POA: Diagnosis not present

## 2024-01-16 DIAGNOSIS — F03B3 Unspecified dementia, moderate, with mood disturbance: Secondary | ICD-10-CM | POA: Diagnosis not present

## 2024-01-22 ENCOUNTER — Other Ambulatory Visit: Payer: Self-pay | Admitting: Family Medicine

## 2024-01-22 DIAGNOSIS — Z1231 Encounter for screening mammogram for malignant neoplasm of breast: Secondary | ICD-10-CM

## 2024-01-22 DIAGNOSIS — M5441 Lumbago with sciatica, right side: Secondary | ICD-10-CM | POA: Diagnosis not present

## 2024-01-22 DIAGNOSIS — F03B3 Unspecified dementia, moderate, with mood disturbance: Secondary | ICD-10-CM | POA: Diagnosis not present

## 2024-01-27 ENCOUNTER — Emergency Department (HOSPITAL_COMMUNITY): Admission: EM | Admit: 2024-01-27 | Discharge: 2024-01-27 | Disposition: A | Discharge status: Home / Self Care

## 2024-01-27 ENCOUNTER — Emergency Department (HOSPITAL_COMMUNITY)

## 2024-01-27 ENCOUNTER — Other Ambulatory Visit: Payer: Self-pay

## 2024-01-27 DIAGNOSIS — M545 Low back pain, unspecified: Secondary | ICD-10-CM | POA: Insufficient documentation

## 2024-01-27 DIAGNOSIS — I700 Atherosclerosis of aorta: Secondary | ICD-10-CM | POA: Diagnosis not present

## 2024-01-27 DIAGNOSIS — M48061 Spinal stenosis, lumbar region without neurogenic claudication: Secondary | ICD-10-CM | POA: Diagnosis not present

## 2024-01-27 DIAGNOSIS — Z7901 Long term (current) use of anticoagulants: Secondary | ICD-10-CM | POA: Insufficient documentation

## 2024-01-27 DIAGNOSIS — M5137 Other intervertebral disc degeneration, lumbosacral region with discogenic back pain only: Secondary | ICD-10-CM | POA: Diagnosis not present

## 2024-01-27 DIAGNOSIS — W19XXXA Unspecified fall, initial encounter: Secondary | ICD-10-CM | POA: Insufficient documentation

## 2024-01-27 DIAGNOSIS — M5459 Other low back pain: Secondary | ICD-10-CM | POA: Diagnosis not present

## 2024-01-27 MED ORDER — LIDOCAINE 5 % EX PTCH
1.0000 | MEDICATED_PATCH | CUTANEOUS | Status: DC
  Administered 2024-01-27: 1 via TRANSDERMAL
  Filled 2024-01-27: qty 1

## 2024-01-27 MED ORDER — ACETAMINOPHEN 325 MG PO TABS
975.0000 mg | ORAL_TABLET | Freq: Once | ORAL | Status: AC
  Administered 2024-01-27: 975 mg via ORAL
  Filled 2024-01-27: qty 3

## 2024-01-27 NOTE — ED Triage Notes (Signed)
 Patient ED by POV with c/o fall causing LPB. Per EMS patient was home and slid out of her chair. She did not hit her head, no LOC and no thinners. She has HX of lower back pain but think fall makes it worse. Pt is demented.

## 2024-01-27 NOTE — ED Provider Notes (Signed)
 [place] EM[facility] DEPARTMENT AT [facility] Provider Note   CSN: [id number] Arrival date & time: 01/27/24  1759     Patient presents with: Michaela Santana is a 78 y.o. female.    Fall Pertinent negatives include no chest pain, no abdominal pain and no shortness of breath.    With fall.  According to daughter at bedside, patient was on the side of her bed when she subsequent slid off the side of the bed and landed on her buttocks.  Did not hit her head.  Did not lose consciousness.  Does not take any blood thinners.  Patient denies all cervical and thoracic back pain.  Endorses chronic lumbar back pain that seems to be slightly worse this point in time.  No bowel bladder incontinence.  No saddle anesthesia.  Patient's been walking around despite chronic back pain here recently with both a walker as well as a cane.  Uses physical therapy at home.  Did not take anything before coming to the ED.  Denies all abdominal pain.  No dysuria.   Previous medical history reviewed : Patient's last admitted back in 2024.  Was admitted in setting of intractable nausea vomiting, splenic infarct.     Prior to Admission medications   Medication Sig Start Date End Date Taking? Authorizing Provider  acetaminophen  (TYLENOL ) 650 MG CR tablet Take 650 mg by mouth every 8 (eight) hours as needed for pain.    [provider]  albuterol  (VENTOLIN  HFA) 108 (90 Base) MCG/ACT inhaler Inhale into the lungs every 6 (six) hours as needed for wheezing or shortness of breath.    [provider]  amLODipine  (NORVASC ) 5 MG tablet Take 5 mg by mouth daily. 01/20/20   [provider]  APIXABAN  (ELIQUIS ) VTE STARTER PACK (10MG  AND 5MG ) Take as directed on package: start with two-5mg  tablets twice daily for 7 days. On day 8, switch to one-5mg  tablet twice daily. 09/09/23   Heilingoetter, Cassandra L, PA-C  Artificial Tear Solution (SYSTANE CONTACTS OP) Apply 0.4 drops to eye as  needed.    [provider]  Blood Glucose Monitoring Suppl (FREESTYLE LITE) DEVI USE UTD 07/26/16   [provider]  calcium  carbonate (TUMS - DOSED IN MG ELEMENTAL CALCIUM ) 500 MG chewable tablet Chew 1 tablet by mouth daily.    [provider]  DULoxetine  HCl 40 MG CPEP Take 1 capsule by mouth daily. 12/10/22   [provider]  ferrous sulfate 325 (65 FE) MG EC tablet Take 325 mg by mouth daily.    [provider]  FREESTYLE LITE test strip USE TO CHECK FASTING GLUCOSE IN THE MORNING AND 2 HOURS AFTER DINNER OR LUNCH 07/26/16   [provider]  furosemide  (LASIX ) 20 MG tablet Take 1 tablet (20 mg total) by mouth as needed for fluid or edema (swelling). Pt needs to keep upcoming appt in Oct for further refills 01/13/24   O'Neal, Darryle Ned, MD  gabapentin  (NEURONTIN ) 300 MG capsule Take 300 mg by mouth every evening. 01/01/20   [provider]  glipiZIDE  (GLUCOTROL  XL) 5 MG 24 hr tablet Take 5 mg by mouth daily with breakfast. 08/13/16   [provider]  Lancets (FREESTYLE) lancets  07/26/16   [provider]  latanoprost  (XALATAN ) 0.005 % ophthalmic solution Place 1 drop into the right eye at bedtime. 09/21/22   [provider]  loratadine (CLARITIN) 10 MG tablet Take 10 mg by mouth daily as needed  for allergies.    [provider]  LORazepam  (ATIVAN ) 0.5 MG tablet Take 0.5 mg by mouth every 8 (eight) hours.    [provider]  losartan  (COZAAR ) 100 MG tablet Take 0.5 tablets (50 mg total) by mouth daily. 10/21/22   Drusilla Sabas RAMAN, MD  magnesium  oxide (MAG-OX) 400 (240 Mg) MG tablet Take 800 mg by mouth 2 (two) times daily. 06/23/21   [provider]  ondansetron  (ZOFRAN ) 8 MG tablet Take 8 mg by mouth every 8 (eight) hours as needed for nausea/vomiting. 09/14/21   [provider]  oxymetazoline (AFRIN) 0.05 % nasal spray Place 1 spray into both nostrils 2 (two) times daily.    [provider]  pantoprazole  (PROTONIX ) 40 MG tablet Take 1 tablet (40 mg total) by mouth 2 (two) times daily before a meal. 11/13/22 01/12/23  Elgergawy, Brayton RAMAN, MD  QUEtiapine  (SEROQUEL ) 25 MG tablet Take 12.5 mg by mouth every other day.    [provider]  vitamin B-12 (CYANOCOBALAMIN ) 1000 MCG tablet Take 1 tablet (1,000 mcg total) by mouth daily. 10/29/21   Caleen Burgess BROCKS, MD    Allergies: Codeine and Alprazolam     Review of Systems  Constitutional:  Negative for chills and fever.  HENT:  Negative for ear pain and sore throat.   Eyes:  Negative for pain and visual disturbance.  Respiratory:  Negative for cough and shortness of breath.   Cardiovascular:  Negative for chest pain and palpitations.  Gastrointestinal:  Negative for abdominal pain and vomiting.  Genitourinary:  Negative for dysuria and hematuria.  Musculoskeletal:  Negative for arthralgias and back pain.  Skin:  Negative for color change and rash.  Neurological:  Negative for seizures and syncope.  All other systems reviewed and are negative.   Updated Vital Signs BP (!) 159/78 (BP Location: Left Arm)   Pulse 75   Temp 98.7 F (37.1 C) (Oral)   Resp 18   Ht 5' (1.524 m)   Wt 83 kg   SpO2 95%   BMI 35.74 kg/m   Physical Exam Vitals and nursing note reviewed.  Constitutional:      General: She is not in acute distress.    Appearance: She is well-developed.  HENT:     Head: Normocephalic and atraumatic.  Eyes:     Conjunctiva/sclera: Conjunctivae normal.  Cardiovascular:     Rate and Rhythm: Normal rate and regular rhythm.     Heart sounds: No murmur heard. Pulmonary:     Effort: Pulmonary effort is normal. No respiratory distress.     Breath sounds: Normal breath sounds.  Abdominal:     Palpations: Abdomen is soft.     Tenderness: There is no abdominal tenderness.  Musculoskeletal:        General: No swelling.       Arms:     Cervical back: Neck supple.  Skin:    General: Skin is warm and  dry.     Capillary Refill: Capillary refill takes less than 2 seconds.  Neurological:     Mental Status: She is alert.  Psychiatric:        Mood and Affect: Mood normal.     (all labs ordered are listed, but only abnormal results are displayed) Labs Reviewed - No data to display  EKG: None  Radiology: DG Lumbar Spine Complete Result Date: 01/27/2024 CLINICAL DATA:  Fall, low back pain EXAM: LUMBAR SPINE - COMPLETE 4+ VIEW COMPARISON:  11/08/2022 abdominal CT FINDINGS:  Slight compression through the superior endplate of L1 suggesting slight compression fracture, new since prior CT. Degenerative disc disease most pronounced at L5-S1 with disc space narrowing, spurring and vacuum disc. Diffuse degenerative facet disease. Normal alignment. Aortic atherosclerosis. IMPRESSION: Slight depression of the superior endplate of L1 suggesting slight compression fracture. This is age indeterminate but new since 11/08/2022. Electronically Signed   By: Franky Crease M.D.   On: 01/27/2024 22:13     Procedures   Medications Ordered in the ED  lidocaine  (LIDODERM ) 5 % 1 patch (1 patch Transdermal Patch Applied 01/27/24 2143)  acetaminophen  (TYLENOL ) tablet 975 mg (975 mg Oral Given 01/27/24 2142)                                    Medical Decision Making Amount and/or Complexity of Data Reviewed Radiology: ordered.  Risk OTC drugs. Prescription drug management.     With fall.  According to daughter at bedside, patient was on the side of her bed when she subsequent slid off the side of the bed and landed on her buttocks.  Did not hit her head.  Did not lose consciousness.  Does not take any blood thinners.  Patient denies all cervical and thoracic back pain.  Endorses chronic lumbar back pain that seems to be slightly worse this point in time.  No bowel bladder incontinence.  No saddle anesthesia.  Patient's been walking around despite chronic back pain here recently with both a walker as well as  a cane.  Uses physical therapy at home.  Did not take anything before coming to the ED.  Denies all abdominal pain.  No dysuria.   Previous medical history reviewed : Patient's last admitted back in 2024.  Was admitted in setting of intractable nausea vomiting, splenic infarct.   On exam, patient alert to self in place but not time which is baseline per daughter.  Otherwise, no focal deficits.   Patient had paraspinal tenderness lumbar area.  Strength and sensation intact in bilateral lower extremities.  No bowel or bladder continence.  No saddle anesthesia.  No concerns any conditions or cord syndrome.  Obtain a plain film to the lumbar area.  Showed maybe slight compression fracture of the L1 vertebrae.  Went over this happened.  She has had a few falls over the past couple months could have happened at any time.  She has chronic pain in this area.  Nevertheless, this is a stable injury.  No indication for any kind of further imaging at this point time.  Tylenol  and lidocaine  patches.    Final diagnoses:  Low back pain, unspecified back pain laterality, unspecified chronicity, unspecified whether sciatica present  Fall, initial encounter    ED Discharge Orders     None          Simon Lavonia SAILOR, MD 01/27/24 2222

## 2024-01-27 NOTE — Discharge Instructions (Addendum)
 Might be a very small compression fraction of L1.  This is stable.  There is nothing to do for this other than pain medication.  You can continue the lidocaine  patch.  For pain, you can take 1000 mg of Tylenol  or 1 g of Tylenol  every 6-8 hours.  Do not exceed more than 4000 mg or 4 g in a 24-hour period.    If she has any  bowel bladder incontinence, increasing pain then please bring her back to the ED.

## 2024-01-30 DIAGNOSIS — F03B3 Unspecified dementia, moderate, with mood disturbance: Secondary | ICD-10-CM | POA: Diagnosis not present

## 2024-01-30 DIAGNOSIS — M5441 Lumbago with sciatica, right side: Secondary | ICD-10-CM | POA: Diagnosis not present

## 2024-01-30 DIAGNOSIS — F0390 Unspecified dementia without behavioral disturbance: Secondary | ICD-10-CM | POA: Diagnosis not present

## 2024-01-30 DIAGNOSIS — M545 Low back pain, unspecified: Secondary | ICD-10-CM | POA: Diagnosis not present

## 2024-01-30 DIAGNOSIS — R399 Unspecified symptoms and signs involving the genitourinary system: Secondary | ICD-10-CM | POA: Diagnosis not present

## 2024-02-04 DIAGNOSIS — M5441 Lumbago with sciatica, right side: Secondary | ICD-10-CM | POA: Diagnosis not present

## 2024-02-04 DIAGNOSIS — F03B3 Unspecified dementia, moderate, with mood disturbance: Secondary | ICD-10-CM | POA: Diagnosis not present

## 2024-02-07 ENCOUNTER — Encounter

## 2024-02-07 DIAGNOSIS — Z1231 Encounter for screening mammogram for malignant neoplasm of breast: Secondary | ICD-10-CM

## 2024-02-07 DIAGNOSIS — M5441 Lumbago with sciatica, right side: Secondary | ICD-10-CM | POA: Diagnosis not present

## 2024-02-07 DIAGNOSIS — F03B3 Unspecified dementia, moderate, with mood disturbance: Secondary | ICD-10-CM | POA: Diagnosis not present

## 2024-02-07 DIAGNOSIS — B349 Viral infection, unspecified: Secondary | ICD-10-CM | POA: Diagnosis not present

## 2024-02-07 DIAGNOSIS — R1110 Vomiting, unspecified: Secondary | ICD-10-CM | POA: Diagnosis not present

## 2024-02-11 DIAGNOSIS — R1110 Vomiting, unspecified: Secondary | ICD-10-CM | POA: Diagnosis not present

## 2024-02-11 DIAGNOSIS — F0390 Unspecified dementia without behavioral disturbance: Secondary | ICD-10-CM | POA: Diagnosis not present

## 2024-02-11 DIAGNOSIS — R630 Anorexia: Secondary | ICD-10-CM | POA: Diagnosis not present

## 2024-02-12 ENCOUNTER — Other Ambulatory Visit: Payer: Self-pay

## 2024-02-12 ENCOUNTER — Emergency Department (HOSPITAL_BASED_OUTPATIENT_CLINIC_OR_DEPARTMENT_OTHER)
Admission: EM | Admit: 2024-02-12 | Discharge: 2024-02-12 | Disposition: A | Discharge status: Home / Self Care | Attending: Emergency Medicine | Admitting: Emergency Medicine

## 2024-02-12 DIAGNOSIS — F0390 Unspecified dementia without behavioral disturbance: Secondary | ICD-10-CM | POA: Insufficient documentation

## 2024-02-12 DIAGNOSIS — E876 Hypokalemia: Secondary | ICD-10-CM | POA: Insufficient documentation

## 2024-02-12 DIAGNOSIS — Z7901 Long term (current) use of anticoagulants: Secondary | ICD-10-CM | POA: Insufficient documentation

## 2024-02-12 DIAGNOSIS — N189 Chronic kidney disease, unspecified: Secondary | ICD-10-CM | POA: Insufficient documentation

## 2024-02-12 DIAGNOSIS — R790 Abnormal level of blood mineral: Secondary | ICD-10-CM

## 2024-02-12 LAB — CBC WITH DIFFERENTIAL/PLATELET
Abs Immature Granulocytes: 0.02 K/uL (ref 0.00–0.07)
Basophils Absolute: 0.1 K/uL (ref 0.0–0.1)
Basophils Relative: 1 %
Eosinophils Absolute: 0.5 K/uL (ref 0.0–0.5)
Eosinophils Relative: 7 %
HCT: 39.3 % (ref 36.0–46.0)
Hemoglobin: 13.6 g/dL (ref 12.0–15.0)
Immature Granulocytes: 0 %
Lymphocytes Relative: 21 %
Lymphs Abs: 1.6 K/uL (ref 0.7–4.0)
MCH: 31.7 pg (ref 26.0–34.0)
MCHC: 34.6 g/dL (ref 30.0–36.0)
MCV: 91.6 fL (ref 80.0–100.0)
Monocytes Absolute: 0.5 K/uL (ref 0.1–1.0)
Monocytes Relative: 6 %
Neutro Abs: 5 K/uL (ref 1.7–7.7)
Neutrophils Relative %: 65 %
Platelets: 328 K/uL (ref 150–400)
RBC: 4.29 MIL/uL (ref 3.87–5.11)
RDW: 13.2 % (ref 11.5–15.5)
WBC: 7.8 K/uL (ref 4.0–10.5)
nRBC: 0 % (ref 0.0–0.2)

## 2024-02-12 LAB — COMPREHENSIVE METABOLIC PANEL WITH GFR
ALT: 26 U/L (ref 0–44)
AST: 35 U/L (ref 15–41)
Albumin: 3.8 g/dL (ref 3.5–5.0)
Alkaline Phosphatase: 99 U/L (ref 38–126)
Anion gap: 16 — ABNORMAL HIGH (ref 5–15)
BUN: 23 mg/dL (ref 8–23)
CO2: 22 mmol/L (ref 22–32)
Calcium: 8 mg/dL — ABNORMAL LOW (ref 8.9–10.3)
Chloride: 102 mmol/L (ref 98–111)
Creatinine, Ser: 1.28 mg/dL — ABNORMAL HIGH (ref 0.44–1.00)
GFR, Estimated: 43 mL/min — ABNORMAL LOW (ref >60)
Glucose, Bld: 193 mg/dL — ABNORMAL HIGH (ref 70–99)
Potassium: 3.3 mmol/L — ABNORMAL LOW (ref 3.5–5.1)
Sodium: 140 mmol/L (ref 135–145)
Total Bilirubin: 0.6 mg/dL (ref 0.0–1.2)
Total Protein: 6.3 g/dL — ABNORMAL LOW (ref 6.5–8.1)

## 2024-02-12 LAB — MAGNESIUM
Magnesium: 0.7 mg/dL — CL (ref 1.7–2.4)
Magnesium: 1.6 mg/dL — ABNORMAL LOW (ref 1.7–2.4)

## 2024-02-12 LAB — LACTIC ACID, PLASMA: Lactic Acid, Venous: 1.2 mmol/L (ref 0.5–1.9)

## 2024-02-12 LAB — LIPASE, BLOOD: Lipase: 27 U/L (ref 11–51)

## 2024-02-12 MED ORDER — MAGNESIUM OXIDE -MG SUPPLEMENT 400 (240 MG) MG PO TABS
400.0000 mg | ORAL_TABLET | Freq: Once | ORAL | Status: AC
  Administered 2024-02-12: 400 mg via ORAL
  Filled 2024-02-12: qty 1

## 2024-02-12 MED ORDER — MAGNESIUM SULFATE 2 GM/50ML IV SOLN
2.0000 g | Freq: Once | INTRAVENOUS | Status: AC
  Administered 2024-02-12: 2 g via INTRAVENOUS
  Filled 2024-02-12: qty 50

## 2024-02-12 MED ORDER — MAGNESIUM OXIDE -MG SUPPLEMENT 400 (240 MG) MG PO TABS: 800.0000 mg | ORAL_TABLET | Freq: Two times a day (BID) | ORAL | 0 refills | Status: DC

## 2024-02-12 MED ORDER — LACTATED RINGERS IV BOLUS
1000.0000 mL | Freq: Once | INTRAVENOUS | Status: AC
  Administered 2024-02-12: 1000 mL via INTRAVENOUS

## 2024-02-12 MED ORDER — POTASSIUM CHLORIDE 20 MEQ PO PACK
20.0000 meq | PACK | Freq: Once | ORAL | Status: AC
  Administered 2024-02-12: 20 meq via ORAL
  Filled 2024-02-12: qty 1

## 2024-02-12 MED ORDER — POTASSIUM CHLORIDE CRYS ER 20 MEQ PO TBCR: 20.0000 meq | EXTENDED_RELEASE_TABLET | Freq: Once | ORAL | Status: DC

## 2024-02-12 NOTE — ED Provider Notes (Signed)
 Received patient in turnover from Dr. Jerrol.  Please see their note for further details of Hx, PE.  Briefly patient is a 78 y.o. female with a Abnormal Lab .  Mg low, given replenishment, plan to recheck.  Family would like to go home, likely d/c.  Repeat magnesium  level with some improvement.  Discussed results with patient and family would like to go home at this time.    Emil Share, DO 02/12/24 1627

## 2024-02-12 NOTE — ED Notes (Signed)
 Pt d/c instructions, medications, and follow-up care reviewed with pt and pt's daughter. Pt and daughter verbalized understanding and had no further questions at time of d/c. Pt baseline orientation CA&Ox4 and in NAD at time of d/c

## 2024-02-12 NOTE — Discharge Instructions (Addendum)
 Continue to take your magnesium  supplements.  Please follow-up with your family doctor in the office.  Foods that are high in potassium usually are high in magnesium .  Try to find a food that you like with each meal for at least the next week.  See if you can get your family doctor to repeat your labs in a week or 2.

## 2024-02-12 NOTE — ED Provider Notes (Signed)
 [name] AT [facility] Provider Note   CSN: [id number] Arrival date & time: 02/12/24  1126     Patient presents with: Abnormal Lab   Michaela Santana is a 78 y.o. female.    Abnormal Lab    78 year old female with medical history significant for dementia, CKD, GERD, presenting to the emergency department with low magnesium .  The history is provided by the patient's daughter who presents bed left side.  The patient is at her baseline mental status.  She had outpatient labs that showed a magnesium  of 0.9 and was sent to the emergency department for magnesium  replenishment.  Patient's daughter states that she had previously been on outpatient magnesium  supplementation but this was stopped because she had diarrhea while taking magnesium  supplementation.  She has been trying to supplement the patient's diet with magnesium  containing foods.  Patient denies any complaints at this time, arrives GCS 14, ABC intact.  Prior to Admission medications   Medication Sig Start Date End Date Taking? Authorizing Provider  acetaminophen  (TYLENOL ) 650 MG CR tablet Take 650 mg by mouth every 8 (eight) hours as needed for pain.    [provider]  albuterol  (VENTOLIN  HFA) 108 (90 Base) MCG/ACT inhaler Inhale into the lungs every 6 (six) hours as needed for wheezing or shortness of breath.    [provider]  amLODipine  (NORVASC ) 5 MG tablet Take 5 mg by mouth daily. 01/20/20   [provider]  APIXABAN  (ELIQUIS ) VTE STARTER PACK (10MG  AND 5MG ) Take as directed on package: start with two-5mg  tablets twice daily for 7 days. On day 8, switch to one-5mg  tablet twice daily. 09/09/23   Heilingoetter, Cassandra L, PA-C  Artificial Tear Solution (SYSTANE CONTACTS OP) Apply 0.4 drops to eye as needed.    [provider]  Blood Glucose Monitoring Suppl (FREESTYLE LITE) DEVI USE UTD 07/26/16   [provider]  calcium  carbonate (TUMS - DOSED IN MG  ELEMENTAL CALCIUM ) 500 MG chewable tablet Chew 1 tablet by mouth daily.    [provider]  DULoxetine  HCl 40 MG CPEP Take 1 capsule by mouth daily. 12/10/22   [provider]  ferrous sulfate 325 (65 FE) MG EC tablet Take 325 mg by mouth daily.    [provider]  FREESTYLE LITE test strip USE TO CHECK FASTING GLUCOSE IN THE MORNING AND 2 HOURS AFTER DINNER OR LUNCH 07/26/16   [provider]  furosemide  (LASIX ) 20 MG tablet Take 1 tablet (20 mg total) by mouth as needed for fluid or edema (swelling). Pt needs to keep upcoming appt in Oct for further refills 01/13/24   O'Neal, Darryle Ned, MD  gabapentin  (NEURONTIN ) 300 MG capsule Take 300 mg by mouth every evening. 01/01/20   [provider]  glipiZIDE  (GLUCOTROL  XL) 5 MG 24 hr tablet Take 5 mg by mouth daily with breakfast. 08/13/16   [provider]  Lancets (FREESTYLE) lancets  07/26/16   [provider]  latanoprost  (XALATAN ) 0.005 % ophthalmic solution Place 1 drop into the right eye at bedtime. 09/21/22   [provider]  loratadine (CLARITIN) 10 MG tablet Take 10 mg by mouth daily as needed for allergies.    [provider]  LORazepam  (ATIVAN ) 0.5 MG tablet Take 0.5 mg by mouth every 8 (eight) hours.    [provider]  losartan  (COZAAR ) 100 MG tablet Take 0.5 tablets (50 mg total) by mouth daily. 10/21/22   Drusilla Sabas RAMAN, MD  magnesium   oxide (MAG-OX) 400 (240 Mg) MG tablet Take 800 mg by mouth 2 (two) times daily. 06/23/21   [provider]  ondansetron  (ZOFRAN ) 8 MG tablet Take 8 mg by mouth every 8 (eight) hours as needed for nausea/vomiting. 09/14/21   [provider]  oxymetazoline (AFRIN) 0.05 % nasal spray Place 1 spray into both nostrils 2 (two) times daily.    [provider]  pantoprazole  (PROTONIX ) 40 MG tablet Take 1 tablet (40 mg total) by mouth 2 (two) times daily before a meal. 11/13/22 01/12/23  Elgergawy, Brayton RAMAN, MD   QUEtiapine  (SEROQUEL ) 25 MG tablet Take 12.5 mg by mouth every other day.    [provider]  vitamin B-12 (CYANOCOBALAMIN ) 1000 MCG tablet Take 1 tablet (1,000 mcg total) by mouth daily. 10/29/21   Caleen Burgess BROCKS, MD    Allergies: Codeine and Alprazolam     Review of Systems  Unable to perform ROS: Dementia    Updated Vital Signs BP 128/75 (BP Location: Right Arm)   Pulse 69   Temp 98 F (36.7 C) (Temporal)   Resp 17   SpO2 95%   Physical Exam Vitals and nursing note reviewed.  Constitutional:      General: She is not in acute distress.    Appearance: She is well-developed.     Comments: GCS 14, ABC intact, pleasantly demented  HENT:     Head: Normocephalic and atraumatic.  Eyes:     Conjunctiva/sclera: Conjunctivae normal.  Cardiovascular:     Rate and Rhythm: Normal rate and regular rhythm.     Heart sounds: No murmur heard. Pulmonary:     Effort: Pulmonary effort is normal. No respiratory distress.     Breath sounds: Normal breath sounds.  Abdominal:     Palpations: Abdomen is soft.     Tenderness: There is no abdominal tenderness. There is no guarding or rebound.  Musculoskeletal:        General: No swelling.     Cervical back: Neck supple.  Skin:    General: Skin is warm and dry.     Capillary Refill: Capillary refill takes less than 2 seconds.  Neurological:     General: No focal deficit present.     Mental Status: She is alert. Mental status is at baseline.     Cranial Nerves: No cranial nerve deficit.     Sensory: No sensory deficit.     Motor: No weakness.  Psychiatric:        Mood and Affect: Mood normal.     (all labs ordered are listed, but only abnormal results are displayed) Labs Reviewed  CBC WITH DIFFERENTIAL/PLATELET  LACTIC ACID, PLASMA  COMPREHENSIVE METABOLIC PANEL WITH GFR  LIPASE, BLOOD  MAGNESIUM     EKG: EKG Interpretation Date/Time:  Wednesday February 12 2024 11:53:14 EDT Ventricular Rate:  58 PR Interval:  233 QRS  Duration:  82 QT Interval:  550 QTC Calculation: 541 R Axis:   -33  Text Interpretation: Wandering atrial pacemaker Probable left atrial enlargement Left axis deviation Minimal ST depression, lateral leads Prolonged QT interval Confirmed by Jerrol Agent ([id number]) on 02/12/2024 12:11:34 PM  Radiology: No results found.   Procedures   Medications Ordered in the ED  magnesium  oxide (MAG-OX) tablet 400 mg (has no administration in time range)  Medical Decision Making Amount and/or Complexity of Data Reviewed Labs: ordered.  Risk OTC drugs. Prescription drug management.    78 year old female with medical history significant for dementia, CKD, GERD, presenting to the emergency department with low magnesium .  The history is provided by the patient's daughter who presents bed left side.  The patient is at her baseline mental status.  She had outpatient labs that showed a magnesium  of 0.9 and was sent to the emergency department for magnesium  replenishment.  Patient's daughter states that she had previously been on outpatient magnesium  supplementation but this was stopped because she had diarrhea while taking magnesium  supplementation.  She has been trying to supplement the patient's diet with magnesium  containing foods.  Patient denies any complaints at this time, arrives GCS 14, ABC intact.  On arrival, the patient was vitally stable, afebrile, not tachycardic or tachypneic, hemodynamically stable, saturating well on room air.  On exam the patient was pleasantly demented, at baseline neurologic status, lungs clear to auscultation bilaterally, abdomen soft, nontender, nondistended, no rebound or guarding.  EKG: Sinus bradycardia with wandering atrial pacemaker, minimal ST segment changes, no STEMI, prolonged QTc QTc 541.  Labs: Initial magnesium  0.7, CMP with hypokalemia to 3.3, serum creatinine 1.28, close to patient's baseline of 1.1, CBC without a  leukocytosis or anemia, lactic acid normal at 1.2.  Magnesium  supplementation was administered orally and IV, potassium supplementation was administered orally and the patient was administered a fluid bolus given mildly elevated creatinine.  Plan to recheck magnesium  following supplementation, patient's daughter is adamant that she would prefer to continue outpatient management at this time and not have the patient come into the hospital.  Signout given to Dr. Emil to reassess the patient pending repeat magnesium , signout given at 1530.     Final diagnoses:  None    ED Discharge Orders     None          Jerrol Agent, MD 02/12/24 1624

## 2024-02-12 NOTE — ED Triage Notes (Signed)
 Patient was called by her PCP and told to come to the ER due to a magnesium  of 0.7.

## 2024-02-13 DIAGNOSIS — R251 Tremor, unspecified: Secondary | ICD-10-CM | POA: Diagnosis not present

## 2024-02-24 DIAGNOSIS — M5441 Lumbago with sciatica, right side: Secondary | ICD-10-CM | POA: Diagnosis not present

## 2024-02-24 DIAGNOSIS — F03B3 Unspecified dementia, moderate, with mood disturbance: Secondary | ICD-10-CM | POA: Diagnosis not present

## 2024-02-25 ENCOUNTER — Ambulatory Visit
Admission: RE | Admit: 2024-02-25 | Discharge: 2024-02-25 | Disposition: A | Discharge status: Home / Self Care | Source: Ambulatory Visit | Attending: Family Medicine

## 2024-02-25 DIAGNOSIS — Z1231 Encounter for screening mammogram for malignant neoplasm of breast: Secondary | ICD-10-CM

## 2024-03-05 ENCOUNTER — Emergency Department (HOSPITAL_BASED_OUTPATIENT_CLINIC_OR_DEPARTMENT_OTHER): Admitting: Radiology

## 2024-03-05 ENCOUNTER — Encounter (HOSPITAL_BASED_OUTPATIENT_CLINIC_OR_DEPARTMENT_OTHER): Payer: Self-pay | Admitting: *Deleted

## 2024-03-05 ENCOUNTER — Inpatient Hospital Stay (HOSPITAL_BASED_OUTPATIENT_CLINIC_OR_DEPARTMENT_OTHER)
Admission: EM | Admit: 2024-03-05 | Discharge: 2024-03-07 | DRG: 641 | Disposition: A | Discharge status: Home / Self Care | Attending: Internal Medicine | Admitting: Internal Medicine

## 2024-03-05 ENCOUNTER — Emergency Department (HOSPITAL_BASED_OUTPATIENT_CLINIC_OR_DEPARTMENT_OTHER)

## 2024-03-05 ENCOUNTER — Other Ambulatory Visit: Payer: Self-pay

## 2024-03-05 DIAGNOSIS — K219 Gastro-esophageal reflux disease without esophagitis: Secondary | ICD-10-CM | POA: Diagnosis present

## 2024-03-05 DIAGNOSIS — C3492 Malignant neoplasm of unspecified part of left bronchus or lung: Secondary | ICD-10-CM | POA: Diagnosis not present

## 2024-03-05 DIAGNOSIS — E860 Dehydration: Secondary | ICD-10-CM | POA: Diagnosis present

## 2024-03-05 DIAGNOSIS — I5032 Chronic diastolic (congestive) heart failure: Secondary | ICD-10-CM | POA: Diagnosis not present

## 2024-03-05 DIAGNOSIS — R197 Diarrhea, unspecified: Secondary | ICD-10-CM | POA: Diagnosis present

## 2024-03-05 DIAGNOSIS — I441 Atrioventricular block, second degree: Secondary | ICD-10-CM | POA: Diagnosis not present

## 2024-03-05 DIAGNOSIS — A0839 Other viral enteritis: Secondary | ICD-10-CM | POA: Diagnosis not present

## 2024-03-05 DIAGNOSIS — E1122 Type 2 diabetes mellitus with diabetic chronic kidney disease: Secondary | ICD-10-CM | POA: Diagnosis not present

## 2024-03-05 DIAGNOSIS — F32A Depression, unspecified: Secondary | ICD-10-CM | POA: Diagnosis not present

## 2024-03-05 DIAGNOSIS — I2699 Other pulmonary embolism without acute cor pulmonale: Secondary | ICD-10-CM | POA: Diagnosis not present

## 2024-03-05 DIAGNOSIS — K8689 Other specified diseases of pancreas: Secondary | ICD-10-CM | POA: Diagnosis not present

## 2024-03-05 DIAGNOSIS — R0682 Tachypnea, not elsewhere classified: Secondary | ICD-10-CM | POA: Diagnosis not present

## 2024-03-05 DIAGNOSIS — E7800 Pure hypercholesterolemia, unspecified: Secondary | ICD-10-CM | POA: Diagnosis present

## 2024-03-05 DIAGNOSIS — N200 Calculus of kidney: Secondary | ICD-10-CM | POA: Diagnosis not present

## 2024-03-05 DIAGNOSIS — N1831 Chronic kidney disease, stage 3a: Secondary | ICD-10-CM | POA: Diagnosis present

## 2024-03-05 DIAGNOSIS — C7A8 Other malignant neuroendocrine tumors: Secondary | ICD-10-CM | POA: Diagnosis present

## 2024-03-05 DIAGNOSIS — Z7901 Long term (current) use of anticoagulants: Secondary | ICD-10-CM | POA: Diagnosis not present

## 2024-03-05 DIAGNOSIS — I130 Hypertensive heart and chronic kidney disease with heart failure and stage 1 through stage 4 chronic kidney disease, or unspecified chronic kidney disease: Secondary | ICD-10-CM | POA: Diagnosis not present

## 2024-03-05 DIAGNOSIS — F03A3 Unspecified dementia, mild, with mood disturbance: Secondary | ICD-10-CM | POA: Diagnosis present

## 2024-03-05 DIAGNOSIS — E66811 Obesity, class 1: Secondary | ICD-10-CM | POA: Diagnosis not present

## 2024-03-05 DIAGNOSIS — I10 Essential (primary) hypertension: Secondary | ICD-10-CM | POA: Diagnosis not present

## 2024-03-05 DIAGNOSIS — I700 Atherosclerosis of aorta: Secondary | ICD-10-CM | POA: Diagnosis not present

## 2024-03-05 DIAGNOSIS — R0902 Hypoxemia: Secondary | ICD-10-CM | POA: Diagnosis not present

## 2024-03-05 DIAGNOSIS — R4182 Altered mental status, unspecified: Secondary | ICD-10-CM | POA: Diagnosis not present

## 2024-03-05 DIAGNOSIS — E119 Type 2 diabetes mellitus without complications: Secondary | ICD-10-CM

## 2024-03-05 DIAGNOSIS — E876 Hypokalemia: Secondary | ICD-10-CM | POA: Diagnosis not present

## 2024-03-05 DIAGNOSIS — Z8249 Family history of ischemic heart disease and other diseases of the circulatory system: Secondary | ICD-10-CM

## 2024-03-05 DIAGNOSIS — Z833 Family history of diabetes mellitus: Secondary | ICD-10-CM | POA: Diagnosis not present

## 2024-03-05 DIAGNOSIS — R918 Other nonspecific abnormal finding of lung field: Secondary | ICD-10-CM | POA: Diagnosis not present

## 2024-03-05 DIAGNOSIS — R9082 White matter disease, unspecified: Secondary | ICD-10-CM | POA: Diagnosis not present

## 2024-03-05 DIAGNOSIS — Z7984 Long term (current) use of oral hypoglycemic drugs: Secondary | ICD-10-CM

## 2024-03-05 DIAGNOSIS — Z6833 Body mass index (BMI) 33.0-33.9, adult: Secondary | ICD-10-CM

## 2024-03-05 DIAGNOSIS — K449 Diaphragmatic hernia without obstruction or gangrene: Secondary | ICD-10-CM | POA: Diagnosis not present

## 2024-03-05 DIAGNOSIS — E1100 Type 2 diabetes mellitus with hyperosmolarity without nonketotic hyperglycemic-hyperosmolar coma (NKHHC): Secondary | ICD-10-CM | POA: Diagnosis not present

## 2024-03-05 LAB — LIPASE, BLOOD: Lipase: 52 U/L — ABNORMAL HIGH (ref 11–51)

## 2024-03-05 LAB — URINALYSIS, ROUTINE W REFLEX MICROSCOPIC
Bilirubin Urine: NEGATIVE
Glucose, UA: NEGATIVE mg/dL
Hgb urine dipstick: NEGATIVE
Ketones, ur: NEGATIVE mg/dL
Leukocytes,Ua: NEGATIVE
Nitrite: NEGATIVE
Specific Gravity, Urine: 1.026 (ref 1.005–1.030)
pH: 5.5 (ref 5.0–8.0)

## 2024-03-05 LAB — COMPREHENSIVE METABOLIC PANEL WITH GFR
ALT: 18 U/L (ref 0–44)
AST: 22 U/L (ref 15–41)
Albumin: 4.1 g/dL (ref 3.5–5.0)
Alkaline Phosphatase: 109 U/L (ref 38–126)
Anion gap: 18 — ABNORMAL HIGH (ref 5–15)
BUN: 20 mg/dL (ref 8–23)
CO2: 20 mmol/L — ABNORMAL LOW (ref 22–32)
Calcium: 8.8 mg/dL — ABNORMAL LOW (ref 8.9–10.3)
Chloride: 99 mmol/L (ref 98–111)
Creatinine, Ser: 1.17 mg/dL — ABNORMAL HIGH (ref 0.44–1.00)
GFR, Estimated: 48 mL/min — ABNORMAL LOW (ref >60)
Glucose, Bld: 253 mg/dL — ABNORMAL HIGH (ref 70–99)
Potassium: 3 mmol/L — ABNORMAL LOW (ref 3.5–5.1)
Sodium: 137 mmol/L (ref 135–145)
Total Bilirubin: 0.6 mg/dL (ref 0.0–1.2)
Total Protein: 7 g/dL (ref 6.5–8.1)

## 2024-03-05 LAB — CBC
HCT: 41 % (ref 36.0–46.0)
Hemoglobin: 14.2 g/dL (ref 12.0–15.0)
MCH: 31.6 pg (ref 26.0–34.0)
MCHC: 34.6 g/dL (ref 30.0–36.0)
MCV: 91.3 fL (ref 80.0–100.0)
Platelets: 346 K/uL (ref 150–400)
RBC: 4.49 MIL/uL (ref 3.87–5.11)
RDW: 12.8 % (ref 11.5–15.5)
WBC: 8.9 K/uL (ref 4.0–10.5)
nRBC: 0 % (ref 0.0–0.2)

## 2024-03-05 LAB — CBG MONITORING, ED: Glucose-Capillary: 234 mg/dL — ABNORMAL HIGH (ref 70–99)

## 2024-03-05 LAB — TSH: TSH: 1.67 u[IU]/mL (ref 0.350–4.500)

## 2024-03-05 LAB — PHOSPHORUS: Phosphorus: 2.5 mg/dL (ref 2.5–4.6)

## 2024-03-05 LAB — MAGNESIUM: Magnesium: 0.7 mg/dL — CL (ref 1.7–2.4)

## 2024-03-05 MED ORDER — SODIUM CHLORIDE 0.9 % IV SOLN: INTRAVENOUS | Status: DC | PRN

## 2024-03-05 MED ORDER — IOHEXOL 300 MG/ML  SOLN
100.0000 mL | Freq: Once | INTRAMUSCULAR | Status: AC | PRN
  Administered 2024-03-05: 100 mL via INTRAVENOUS

## 2024-03-05 MED ORDER — POTASSIUM CHLORIDE 10 MEQ/100ML IV SOLN
10.0000 meq | INTRAVENOUS | Status: AC
  Administered 2024-03-05 (×3): 10 meq via INTRAVENOUS
  Filled 2024-03-05 (×2): qty 100

## 2024-03-05 MED ORDER — MAGNESIUM SULFATE 50 % IJ SOLN
2.0000 g | Freq: Once | INTRAMUSCULAR | Status: DC
  Filled 2024-03-05: qty 4

## 2024-03-05 MED ORDER — MAGNESIUM SULFATE 2 GM/50ML IV SOLN
2.0000 g | Freq: Once | INTRAVENOUS | Status: AC
  Administered 2024-03-05: 2 g via INTRAVENOUS
  Filled 2024-03-05: qty 50

## 2024-03-05 MED ORDER — LORAZEPAM 1 MG PO TABS
1.0000 mg | ORAL_TABLET | Freq: Once | ORAL | Status: AC
  Administered 2024-03-05: 1 mg via ORAL
  Filled 2024-03-05: qty 1

## 2024-03-05 NOTE — ED Notes (Signed)
 Patient is highly anxious. Daughter at bedside requesting patient's home medication ativan  0.5 mg for patient's sundowning. ED provider notified.

## 2024-03-05 NOTE — ED Provider Notes (Signed)
 [place] EMERGENCY DEPARTMENT AT [facility] Provider Note   CSN: [id number] Arrival date & time: 03/05/24  1419  Patient presents with: Abnormal Lab, Altered Mental Status, and Diarrhea  Michaela Santana is a 79 y.o. 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  Patient has dementia and frequently forgets information in the past week, daughter at bedside was able to provide history.  Patient was seen at [facility] a few weeks ago for low magnesium  levels which were repleted and the patient was discharged.  Patient's daughter states that magnesium  levels are low again (previously 0.7 which increased to 0.9 after IV replacement) and she has tried to use oral supplements which have given the patient worsening diarrhea.  She has also been experiencing some abdominal pain most recently yesterday that lasted for an hour.  The patient spent an hour lying down which resolved her pain. Additionally, the patient has been more easily angered which happened the last time that she was found to have low magnesium  levels.  Denies pain at this moment.  Patient also denies any recent travel, appetite change, recent antibiotics, new foods, pain after eating.  Patient denies chest pain, SOB, fevers, chills, dysuria, urinary frequency, hemoptysis, cough, constipation, hematochezia or any other symptoms at this time.    Prior to Admission medications   Medication Sig Start Date End Date Taking? Authorizing Provider  potassium chloride  (KLOR-CON ) 20 MEQ packet Take 20 mEq by mouth daily. 02/20/24  Yes [provider]  acetaminophen  (TYLENOL ) 650 MG CR tablet Take 650 mg by mouth every 8 (eight) hours as needed for pain.    [provider]  albuterol  (VENTOLIN  HFA) 108 (90 Base) MCG/ACT  inhaler Inhale into the lungs every 6 (six) hours as needed for wheezing or shortness of breath.    [provider]  amLODipine  (NORVASC ) 5 MG tablet Take 5 mg by mouth daily. 01/20/20   [provider]  APIXABAN  (ELIQUIS ) VTE STARTER PACK (10MG  AND 5MG ) Take as directed on package: start with two-5mg  tablets twice daily for 7 days. On day 8, switch to one-5mg  tablet twice daily. 09/09/23   Heilingoetter, Cassandra L, PA-C  Artificial Tear Solution (SYSTANE CONTACTS OP) Apply 0.4 drops to eye as needed.    [provider]  Blood Glucose Monitoring Suppl (FREESTYLE LITE) DEVI USE UTD 07/26/16   [provider]  calcium  carbonate (TUMS - DOSED IN MG ELEMENTAL CALCIUM ) 500 MG chewable tablet Chew 1 tablet by mouth daily.    [provider]  DULoxetine  HCl 40 MG CPEP Take 1 capsule by mouth daily. 12/10/22   [provider]  ferrous sulfate 325 (65 FE) MG EC tablet Take 325 mg by mouth daily.    [provider]  FREESTYLE LITE test strip USE TO CHECK FASTING GLUCOSE IN THE MORNING AND 2 HOURS AFTER DINNER OR LUNCH 07/26/16   [provider]  furosemide  (LASIX ) 20 MG tablet Take 1 tablet (20 mg total) by mouth as needed for fluid or edema (swelling). Pt needs to keep upcoming appt in Oct for further refills 01/13/24   O'Neal, Darryle Ned, MD  gabapentin  (NEURONTIN ) 300 MG capsule Take 300 mg by mouth every evening. 01/01/20   [provider]  glipiZIDE  (GLUCOTROL  XL) 5 MG 24 hr tablet Take 5 mg  by mouth daily with breakfast. 08/13/16   [provider]  Lancets (FREESTYLE) lancets  07/26/16   [provider]  latanoprost  (XALATAN ) 0.005 % ophthalmic solution Place 1 drop into the right eye at bedtime. 09/21/22   [provider]  loratadine (CLARITIN) 10 MG tablet Take 10 mg by mouth daily as needed for allergies.    [provider]  LORazepam  (ATIVAN ) 0.5 MG tablet Take 0.5 mg by mouth every 8 (eight)  hours.    [provider]  losartan  (COZAAR ) 100 MG tablet Take 0.5 tablets (50 mg total) by mouth daily. 10/21/22   Drusilla Sabas RAMAN, MD  magnesium  oxide (MAG-OX) 400 (240 Mg) MG tablet Take 2 tablets (800 mg total) by mouth 2 (two) times daily. 02/12/24   Emil Share, DO  ondansetron  (ZOFRAN ) 8 MG tablet Take 8 mg by mouth every 8 (eight) hours as needed for nausea/vomiting. 09/14/21   [provider]  oxymetazoline (AFRIN) 0.05 % nasal spray Place 1 spray into both nostrils 2 (two) times daily.    [provider]  pantoprazole  (PROTONIX ) 40 MG tablet Take 1 tablet (40 mg total) by mouth 2 (two) times daily before a meal. 11/13/22 01/12/23  Elgergawy, Brayton RAMAN, MD  QUEtiapine  (SEROQUEL ) 25 MG tablet Take 12.5 mg by mouth every other day.    [provider]  vitamin B-12 (CYANOCOBALAMIN ) 1000 MCG tablet Take 1 tablet (1,000 mcg total) by mouth daily. 10/29/21   Caleen Burgess BROCKS, MD    Allergies: Codeine and Alprazolam     Review of Systems  Constitutional:  Negative for appetite change, chills and fever.  Respiratory:  Negative for cough and shortness of breath.   Cardiovascular:  Positive for leg swelling (around ankles). Negative for chest pain.  Gastrointestinal:  Positive for diarrhea, nausea and vomiting. Negative for abdominal distention, abdominal pain and blood in stool.  Endocrine: Negative for polyuria.  Genitourinary:  Negative for dysuria, flank pain and frequency.  Neurological:  Negative for headaches.  Psychiatric/Behavioral:         Easily frustrated    Updated Vital Signs BP (!) 181/65   Pulse (!) 59   Temp 98 F (36.7 C)   Resp 16   SpO2 95%   Physical Exam Vitals reviewed.  Constitutional:      General: She is not in acute distress.    Appearance: Normal appearance. She is normal weight. She is not ill-appearing, toxic-appearing or diaphoretic.  HENT:     Head: Normocephalic and atraumatic.     Mouth/Throat:     Pharynx: Oropharynx is  clear. No oropharyngeal exudate or posterior oropharyngeal erythema.  Eyes:     General: No scleral icterus.       Right eye: No discharge.        Left eye: No discharge.     Extraocular Movements: Extraocular movements intact.     Conjunctiva/sclera: Conjunctivae normal.     Pupils: Pupils are equal, round, and reactive to light.  Cardiovascular:     Rate and Rhythm: Normal rate and regular rhythm.     Pulses: Normal pulses.     Heart sounds: Normal heart sounds. No murmur heard.    No friction rub. No gallop.  Pulmonary:     Effort: Pulmonary effort is normal. No respiratory distress.     Breath sounds: Normal breath sounds. No stridor. No wheezing, rhonchi or rales.  Abdominal:     General: Abdomen is flat. There is no distension.  Palpations: Abdomen is soft. There is no mass.     Tenderness: There is no abdominal tenderness. There is no right CVA tenderness, left CVA tenderness, guarding or rebound.     Hernia: No hernia is present.  Musculoskeletal:        General: No swelling, tenderness, deformity or signs of injury.     Right lower leg: No edema.     Left lower leg: No edema.  Skin:    General: Skin is warm and dry.     Capillary Refill: Capillary refill takes less than 2 seconds.     Coloration: Skin is not jaundiced or pale.     Findings: No bruising, erythema, lesion or rash.  Neurological:     General: No focal deficit present.     Mental Status: She is alert. Mental status is at baseline.     Motor: No weakness.     Gait: Gait normal.  Psychiatric:        Mood and Affect: Mood normal.        Behavior: Behavior normal.     (all labs ordered are listed, but only abnormal results are displayed) Labs Reviewed  LIPASE, BLOOD - Abnormal; Notable for the following components:      Result Value   Lipase 52 (*)    All other components within normal limits  COMPREHENSIVE METABOLIC PANEL WITH GFR - Abnormal; Notable for the following components:   Potassium 3.0  (*)    CO2 20 (*)    Glucose, Bld 253 (*)    Creatinine, Ser 1.17 (*)    Calcium  8.8 (*)    GFR, Estimated 48 (*)    Anion gap 18 (*)    All other components within normal limits  URINALYSIS, ROUTINE W REFLEX MICROSCOPIC - Abnormal; Notable for the following components:   Color, Urine COLORLESS (*)    Protein, ur TRACE (*)    All other components within normal limits  MAGNESIUM  - Abnormal; Notable for the following components:   Magnesium  0.7 (*)    All other components within normal limits  CBG MONITORING, ED - Abnormal; Notable for the following components:   Glucose-Capillary 234 (*)    All other components within normal limits  CBC  TSH  PHOSPHORUS    EKG: 2nd Degree AV block type 1  Radiology: DG Chest 2 View Result Date: 03/05/2024 CLINICAL DATA:  Tachypnea and hypoxia. EXAM: CHEST - 2 VIEW COMPARISON:  Chest x-ray 11/08/2022.  CT of the chest 09/09/2023. FINDINGS: There are increased central interstitial markings bilaterally. Previously identified small nodular densities are not well defined on this x-ray. There is a large hiatal hernia, unchanged. There is no pleural effusion or pneumothorax. IMPRESSION: 1. Increased central interstitial markings bilaterally, which may represent pulmonary edema or atypical infection. 2. Large hiatal hernia. Electronically Signed   By: Greig Pique M.D.   On: 03/05/2024 17:53   CT ABDOMEN PELVIS W CONTRAST Result Date: 03/05/2024 CLINICAL DATA:  Diarrhea. Worsening altered mental status. Diarrhea for 2 days. Nausea starting today. EXAM: CT ABDOMEN AND PELVIS WITH CONTRAST TECHNIQUE: Multidetector CT imaging of the abdomen and pelvis was performed using the standard protocol following bolus administration of intravenous contrast. RADIATION DOSE REDUCTION: This exam was performed according to the departmental dose-optimization program which includes automated exposure control, adjustment of the mA and/or kV according to patient size and/or use of  iterative reconstruction technique. CONTRAST:  OMNIPAQUE  IOHEXOL  300 MG/ML  SOLN COMPARISON:  Chest radiograph 02/11/2024. MRI  abdomen 10/21/2023. CT abdomen and pelvis 11/08/2022 FINDINGS: Lower chest: Numerous pulmonary nodules demonstrated in both lung bases. Nodules were present on the previous study but appear more numerous today. This is likely metastatic. Large esophageal hiatal hernia. Hepatobiliary: Mild diffuse fatty infiltration of the liver. Focal lesion centrally in the liver measuring 2.3 cm diameter. This has heterogeneous mostly hypodense appearance. No change since prior study. On prior MRI, this area demonstrates signal loss on out of phase T1 weighted images, likely indicating focal fatty infiltration. Gallbladder is surgically absent. No bile duct dilatation. Pancreas: Diffuse fatty atrophy of the pancreas. No acute abnormalities. Spleen: Normal in size without focal abnormality. Adrenals/Urinary Tract: No adrenal gland nodules. Renal parenchymal atrophy and scarring bilaterally. Left intrarenal stone measuring 3 mm diameter. Focal hypoenhancing lesion again demonstrated in the left kidney measuring 2.4 cm diameter. This was felt consistent with renal cell carcinoma on prior MRI characterization. No hydronephrosis or hydroureter. Bladder is normal. Stomach/Bowel: Stomach, small bowel, and colon are not abnormally distended. No wall thickening or inflammatory stranding. Sigmoid colonic diverticula without evidence of acute diverticulitis. Small duodenal diverticulum. Appendix is normal. Vascular/Lymphatic: Aortic atherosclerosis. No enlarged abdominal or pelvic lymph nodes. Reproductive: Uterus and bilateral adnexa are unremarkable. Other: No abdominal wall hernia or abnormality. No abdominopelvic ascites. Musculoskeletal: Degenerative changes in the spine. Compression of the superior endplate of L1 is new since prior study (CT abdomen and pelvis 11/08/2022 and lumbar spine radiographs  01/27/2024) and may represent acute compression. No destructive bone lesions are identified. Degenerative changes in the hips. IMPRESSION: 1. Numerous pulmonary nodules in the lung bases appear more numerous since on prior study. This is likely metastatic. 2. Large esophageal hiatal hernia. 3. Focal area of fatty infiltration demonstrated in the central liver. 4. Nonobstructing stone in the left kidney. 5. Hypoenhancing left renal mass measuring 2.4 cm diameter. This was present previously and felt to be consistent with small renal cell carcinoma on prior MRI. 6. No evidence of bowel obstruction or inflammation. 7. Aortic atherosclerosis. 8. Superior endplate compression at L1 is new since prior study and may represent acute fracture. Electronically Signed   By: Elsie Gravely M.D.   On: 03/05/2024 17:52   Medications Ordered in the ED  0.9 %  sodium chloride  infusion (0 mLs Intravenous Stopped 03/05/24 2016)  potassium chloride  10 mEq in 100 mL IVPB (0 mEq Intravenous Stopped 03/05/24 2015)  magnesium  sulfate IVPB 2 g 50 mL (0 g Intravenous Stopped 03/05/24 1710)  iohexol  (OMNIPAQUE ) 300 MG/ML solution 100 mL (100 mLs Intravenous Contrast Given 03/05/24 1721)    Medical Decision Making Patient presenting with history of hypomagnesemia despite IV replacement.  Now presenting with N/V/D.  Magnesium  level critically low at 0.7 with past value of 1.6 three weeks ago.  Likely in the setting of inadequate absorption, however, cannot rule out other etiologies of electrolyte abnormalities such as GI loss.  Given history of abdominal pain, reasonable to pursue CT imaging of the abdomen/pelvis. Will also order CXR given patient's oxygen saturation slightly low at 93% with a history of neuroendocrine carcinoma of the lung. Patient's other electrolyte abnormalities include hypokalemia which may be in the setting of hypomagnesemia, however we will replace both with IV supplementation. CBC within normal  limits. Ordered TSH in the setting of increasing AMS. Lipase high normal, low suspicion for pancreatitis given lack of current abdominal pain, however waiting for CT imaging. Differential is broad and includes carcinoid syndrome, pancreatitis, partial SBO, colitis, mesenteric ischemia, gastroparesis, infectious gastroenteritis, C.  difficile colitis.  5:20 PM TSH and phosphorus wnl. Vitals remain stable. Imaging still pending.  6:10 PM  Last oxygen saturation of 91% on RA. CXR showing increased interstitial markings which may represent pulmonary edema or atypical infection.  Large hiatal hernia which is unchanged.  CT abdomen pelvis showing numerous pulmonary nodules in the lung bases which is likely metastatic as well as hypoenhancing left renal mass which may represent small renal cell carcinoma seen previously on prior MRI.  No evidence of bowel obstruction or inflammation. Given persistent hypomagnesemia despite IV repletion, appropriate to admit patient for observation and further workup of electrolyte abnormalities and diarrhea.  Will consult hospitalist for admission.  7:20 PM  Re-examined patient who states she is feeling fine. Discussed possible admission with the patient and her daughter.  Daughter is concerned of admission because the patient has previously struggled with delirium during past hospitalizations and states that she does not do well in the hospital.  Explained the risks of leaving without appropriate workup with the patient including continual electrolyte deficiencies and potentially fatal arrhythmia.  7:55 PM Discussed with the patient and her 2 daughters about admission to the hospital.  They reemphasized concerns about delirium and how long it will take to return to baseline mental status.  However, they are agreeable with plan for admission.  8:40 PM Discussed patient case with Dr. Charlton with [facility] who agree with admission.  Final diagnoses:   Hypomagnesemia    ED Discharge Orders     None          Waymond Cart, MD 03/05/24 2049    Mannie Pac T, DO 03/05/24 2253

## 2024-03-05 NOTE — ED Notes (Signed)
 Pt ambulatory to bathroom

## 2024-03-05 NOTE — ED Notes (Signed)
 Karna ren with [facility] called for consult

## 2024-03-05 NOTE — Progress Notes (Signed)
 Plan of Care Note for accepted transfer   Patient: Michaela Santana MRN: [id number]   DOA: 03/05/2024  Facility requesting transfer: [facility]  Requesting Provider: Dr. Mannie   Reason for transfer: Diarrhea, hypomagnesemia   Facility course: 78 yr old female with HTN, DM, CKD 3A, dementia, renal mass followed by urology, and stage IV carcinoid tumor who presents with ongoing diarrhea and critically-low magnesium  despite outpatient magnesium  supplementation.   Potassium is 3.0 and magnesium  0.7 with stable creatinine.   She was given IVF, IV potassium, and I V magnesium  in the ED.   Plan of care: The patient is accepted for admission to Telemetry unit, at [facility].   Author: Evalene GORMAN Sprinkles, MD 03/05/2024  Check [email or web address] for on-call coverage.  Nursing staff, Please call [facility] Admits & Consults System-Wide number on Amion as soon as patient's arrival, so appropriate admitting provider can evaluate the pt.

## 2024-03-05 NOTE — ED Triage Notes (Addendum)
 Diarrhea x 2 days with nausea starting today. Worsening confusing with hx of dementia. Patient was recently seen for a magnesium  of 0.7. IV replacement given but repeat check was only 0.9.   Labwork done at PCP had K+ at 3.7.

## 2024-03-06 ENCOUNTER — Inpatient Hospital Stay (HOSPITAL_COMMUNITY)

## 2024-03-06 ENCOUNTER — Observation Stay (HOSPITAL_COMMUNITY)

## 2024-03-06 DIAGNOSIS — R197 Diarrhea, unspecified: Secondary | ICD-10-CM

## 2024-03-06 DIAGNOSIS — C7A8 Other malignant neuroendocrine tumors: Secondary | ICD-10-CM | POA: Diagnosis present

## 2024-03-06 DIAGNOSIS — I130 Hypertensive heart and chronic kidney disease with heart failure and stage 1 through stage 4 chronic kidney disease, or unspecified chronic kidney disease: Secondary | ICD-10-CM | POA: Diagnosis present

## 2024-03-06 DIAGNOSIS — K219 Gastro-esophageal reflux disease without esophagitis: Secondary | ICD-10-CM | POA: Diagnosis present

## 2024-03-06 DIAGNOSIS — R9082 White matter disease, unspecified: Secondary | ICD-10-CM | POA: Diagnosis not present

## 2024-03-06 DIAGNOSIS — I441 Atrioventricular block, second degree: Secondary | ICD-10-CM | POA: Diagnosis present

## 2024-03-06 DIAGNOSIS — Z7984 Long term (current) use of oral hypoglycemic drugs: Secondary | ICD-10-CM | POA: Diagnosis not present

## 2024-03-06 DIAGNOSIS — R4182 Altered mental status, unspecified: Secondary | ICD-10-CM | POA: Diagnosis not present

## 2024-03-06 DIAGNOSIS — Z6833 Body mass index (BMI) 33.0-33.9, adult: Secondary | ICD-10-CM | POA: Diagnosis not present

## 2024-03-06 DIAGNOSIS — I5032 Chronic diastolic (congestive) heart failure: Secondary | ICD-10-CM | POA: Diagnosis present

## 2024-03-06 DIAGNOSIS — Z8249 Family history of ischemic heart disease and other diseases of the circulatory system: Secondary | ICD-10-CM | POA: Diagnosis not present

## 2024-03-06 DIAGNOSIS — E860 Dehydration: Secondary | ICD-10-CM | POA: Diagnosis present

## 2024-03-06 DIAGNOSIS — E1100 Type 2 diabetes mellitus with hyperosmolarity without nonketotic hyperglycemic-hyperosmolar coma (NKHHC): Secondary | ICD-10-CM | POA: Diagnosis not present

## 2024-03-06 DIAGNOSIS — E1122 Type 2 diabetes mellitus with diabetic chronic kidney disease: Secondary | ICD-10-CM | POA: Diagnosis present

## 2024-03-06 DIAGNOSIS — E7800 Pure hypercholesterolemia, unspecified: Secondary | ICD-10-CM | POA: Diagnosis present

## 2024-03-06 DIAGNOSIS — Z833 Family history of diabetes mellitus: Secondary | ICD-10-CM | POA: Diagnosis not present

## 2024-03-06 DIAGNOSIS — K449 Diaphragmatic hernia without obstruction or gangrene: Secondary | ICD-10-CM | POA: Diagnosis not present

## 2024-03-06 DIAGNOSIS — C3492 Malignant neoplasm of unspecified part of left bronchus or lung: Secondary | ICD-10-CM | POA: Diagnosis not present

## 2024-03-06 DIAGNOSIS — N1831 Chronic kidney disease, stage 3a: Secondary | ICD-10-CM | POA: Diagnosis present

## 2024-03-06 DIAGNOSIS — E876 Hypokalemia: Secondary | ICD-10-CM | POA: Diagnosis present

## 2024-03-06 DIAGNOSIS — E66811 Obesity, class 1: Secondary | ICD-10-CM | POA: Diagnosis present

## 2024-03-06 DIAGNOSIS — F32A Depression, unspecified: Secondary | ICD-10-CM | POA: Diagnosis present

## 2024-03-06 DIAGNOSIS — Z7901 Long term (current) use of anticoagulants: Secondary | ICD-10-CM | POA: Diagnosis not present

## 2024-03-06 DIAGNOSIS — R918 Other nonspecific abnormal finding of lung field: Secondary | ICD-10-CM | POA: Diagnosis not present

## 2024-03-06 DIAGNOSIS — F03A3 Unspecified dementia, mild, with mood disturbance: Secondary | ICD-10-CM | POA: Diagnosis present

## 2024-03-06 DIAGNOSIS — I700 Atherosclerosis of aorta: Secondary | ICD-10-CM | POA: Diagnosis not present

## 2024-03-06 DIAGNOSIS — I2699 Other pulmonary embolism without acute cor pulmonale: Secondary | ICD-10-CM | POA: Diagnosis not present

## 2024-03-06 DIAGNOSIS — A0839 Other viral enteritis: Secondary | ICD-10-CM | POA: Diagnosis present

## 2024-03-06 LAB — C DIFFICILE QUICK SCREEN W PCR REFLEX
C Diff antigen: NEGATIVE
C Diff interpretation: NOT DETECTED
C Diff toxin: NEGATIVE

## 2024-03-06 LAB — CBC
HCT: 41.3 % (ref 36.0–46.0)
Hemoglobin: 14 g/dL (ref 12.0–15.0)
MCH: 31.5 pg (ref 26.0–34.0)
MCHC: 33.9 g/dL (ref 30.0–36.0)
MCV: 92.8 fL (ref 80.0–100.0)
Platelets: 312 K/uL (ref 150–400)
RBC: 4.45 MIL/uL (ref 3.87–5.11)
RDW: 12.9 % (ref 11.5–15.5)
WBC: 8 K/uL (ref 4.0–10.5)
nRBC: 0 % (ref 0.0–0.2)

## 2024-03-06 LAB — COMPREHENSIVE METABOLIC PANEL WITH GFR
ALT: 20 U/L (ref 0–44)
AST: 24 U/L (ref 15–41)
Albumin: 4 g/dL (ref 3.5–5.0)
Alkaline Phosphatase: 106 U/L (ref 38–126)
Anion gap: 14 (ref 5–15)
BUN: 16 mg/dL (ref 8–23)
CO2: 22 mmol/L (ref 22–32)
Calcium: 8.7 mg/dL — ABNORMAL LOW (ref 8.9–10.3)
Chloride: 102 mmol/L (ref 98–111)
Creatinine, Ser: 1.02 mg/dL — ABNORMAL HIGH (ref 0.44–1.00)
GFR, Estimated: 56 mL/min — ABNORMAL LOW (ref >60)
Glucose, Bld: 161 mg/dL — ABNORMAL HIGH (ref 70–99)
Potassium: 3.2 mmol/L — ABNORMAL LOW (ref 3.5–5.1)
Sodium: 138 mmol/L (ref 135–145)
Total Bilirubin: 0.9 mg/dL (ref 0.0–1.2)
Total Protein: 6.7 g/dL (ref 6.5–8.1)

## 2024-03-06 LAB — GLUCOSE, CAPILLARY
Glucose-Capillary: 161 mg/dL — ABNORMAL HIGH (ref 70–99)
Glucose-Capillary: 195 mg/dL — ABNORMAL HIGH (ref 70–99)
Glucose-Capillary: 215 mg/dL — ABNORMAL HIGH (ref 70–99)
Glucose-Capillary: 165 mg/dL — ABNORMAL HIGH (ref 70–99)
Glucose-Capillary: 230 mg/dL — ABNORMAL HIGH (ref 70–99)

## 2024-03-06 LAB — HEMOGLOBIN A1C
Hgb A1c MFr Bld: 8.1 % — ABNORMAL HIGH (ref 4.8–5.6)
Mean Plasma Glucose: 185.77 mg/dL

## 2024-03-06 LAB — TSH: TSH: 2.83 u[IU]/mL (ref 0.350–4.500)

## 2024-03-06 LAB — MAGNESIUM: Magnesium: 1.5 mg/dL — ABNORMAL LOW (ref 1.7–2.4)

## 2024-03-06 MED ORDER — LORAZEPAM 0.5 MG PO TABS
0.5000 mg | ORAL_TABLET | Freq: Three times a day (TID) | ORAL | Status: DC | PRN
  Administered 2024-03-06: 0.5 mg via ORAL
  Filled 2024-03-06: qty 1

## 2024-03-06 MED ORDER — LOPERAMIDE HCL 2 MG PO CAPS: 2.0000 mg | ORAL_CAPSULE | Freq: Three times a day (TID) | ORAL | Status: DC | PRN

## 2024-03-06 MED ORDER — POTASSIUM CHLORIDE CRYS ER 20 MEQ PO TBCR
40.0000 meq | EXTENDED_RELEASE_TABLET | Freq: Once | ORAL | Status: AC
Start: 2024-03-06 — End: 2024-03-06
  Administered 2024-03-06: 40 meq via ORAL
  Filled 2024-03-06: qty 2

## 2024-03-06 MED ORDER — PROCHLORPERAZINE EDISYLATE 10 MG/2ML IJ SOLN
10.0000 mg | Freq: Once | INTRAMUSCULAR | Status: AC
  Administered 2024-03-06: 10 mg via INTRAVENOUS
  Filled 2024-03-06: qty 2

## 2024-03-06 MED ORDER — ACETAMINOPHEN 650 MG RE SUPP: 650.0000 mg | Freq: Four times a day (QID) | RECTAL | Status: DC | PRN

## 2024-03-06 MED ORDER — ACETAMINOPHEN 325 MG PO TABS: 650.0000 mg | ORAL_TABLET | Freq: Four times a day (QID) | ORAL | Status: DC | PRN

## 2024-03-06 MED ORDER — INSULIN ASPART 100 UNIT/ML IJ SOLN
0.0000 [IU] | Freq: Three times a day (TID) | INTRAMUSCULAR | Status: DC
  Administered 2024-03-06: 2 [IU] via SUBCUTANEOUS
  Administered 2024-03-06 (×2): 1 [IU] via SUBCUTANEOUS
  Administered 2024-03-07: 2 [IU] via SUBCUTANEOUS

## 2024-03-06 MED ORDER — HYDRALAZINE HCL 20 MG/ML IJ SOLN: 10.0000 mg | Freq: Four times a day (QID) | INTRAMUSCULAR | Status: DC | PRN

## 2024-03-06 MED ORDER — PANTOPRAZOLE SODIUM 40 MG PO TBEC
40.0000 mg | DELAYED_RELEASE_TABLET | Freq: Two times a day (BID) | ORAL | Status: DC
Start: 2024-03-06 — End: 2024-03-07
  Administered 2024-03-06 – 2024-03-07 (×3): 40 mg via ORAL
  Filled 2024-03-06 (×3): qty 1

## 2024-03-06 MED ORDER — AMLODIPINE BESYLATE 5 MG PO TABS
5.0000 mg | ORAL_TABLET | Freq: Every day | ORAL | Status: DC
  Administered 2024-03-06 – 2024-03-07 (×2): 5 mg via ORAL
  Filled 2024-03-06 (×2): qty 1

## 2024-03-06 MED ORDER — POTASSIUM CHLORIDE 10 MEQ/100ML IV SOLN
10.0000 meq | INTRAVENOUS | Status: AC
  Administered 2024-03-06 (×4): 10 meq via INTRAVENOUS
  Filled 2024-03-06 (×4): qty 100

## 2024-03-06 MED ORDER — QUETIAPINE FUMARATE 25 MG PO TABS
12.5000 mg | ORAL_TABLET | ORAL | Status: DC
  Administered 2024-03-06: 12.5 mg via ORAL
  Filled 2024-03-06: qty 1

## 2024-03-06 MED ORDER — SODIUM CHLORIDE 0.9 % IV SOLN: INTRAVENOUS | Status: AC

## 2024-03-06 MED ORDER — APIXABAN 5 MG PO TABS
5.0000 mg | ORAL_TABLET | Freq: Two times a day (BID) | ORAL | Status: DC
Start: 2024-03-06 — End: 2024-03-07
  Administered 2024-03-06 – 2024-03-07 (×3): 5 mg via ORAL
  Filled 2024-03-06 (×3): qty 1

## 2024-03-06 MED ORDER — ALBUTEROL SULFATE (2.5 MG/3ML) 0.083% IN NEBU: 2.5000 mg | INHALATION_SOLUTION | RESPIRATORY_TRACT | Status: DC | PRN

## 2024-03-06 MED ORDER — IOHEXOL 300 MG/ML  SOLN
75.0000 mL | Freq: Once | INTRAMUSCULAR | Status: AC | PRN
Start: 2024-03-06 — End: 2024-03-06
  Administered 2024-03-06: 75 mL via INTRAVENOUS

## 2024-03-06 MED ORDER — DEXTROSE 5 % IV SOLN
4.0000 g | Freq: Once | INTRAVENOUS | Status: AC
  Administered 2024-03-06: 4 g via INTRAVENOUS
  Filled 2024-03-06: qty 8

## 2024-03-06 NOTE — H&P (Addendum)
 History and Physical    Michaela Santana FMW:[id number] DOB: 1946-02-12 DOA: 03/05/2024  PCP: Chrystal Lamarr RAMAN, MD  Patient coming from: [facility]  I have personally briefly reviewed patient's old medical records in [facility]  Chief Complaint: diarrhea , confusion  , low magnesium    HPI: Michaela Santana is a 78 y.o. 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 Patient currently state no diarrhea since being admitted. She notes no abdominal pain. She is alert and oriented and has no current complaints.   ED Course:  IN ED  Vitals:afeb, bp 145/103, hr 70 sat 94% r4r 21 Labs  Wbc 8.9, hgb 14.2 , plt346 Lipase 52  Na 137, K 3, Cl 99, bicarb 20 glu 253, cr 1.17  AG 18 UA neg Mag 0.7 EKG: snr pac , repeat Mobitz type 1 heart block ( seen on prior occasion)  Cxr IMPRESSION: 1. Increased central interstitial markings bilaterally, which may represent pulmonary edema or atypical infection. 2. Large hiatal hernia.  CTAB IMPRESSION: 1. Numerous pulmonary nodules in the lung bases appear more numerous since on prior study. This is likely metastatic. 2. Large esophageal hiatal hernia. 3. Focal area of fatty infiltration demonstrated in the central liver. 4. Nonobstructing stone in the left kidney. 5. Hypoenhancing left renal mass measuring 2.4 cm diameter. This was present previously and felt to be consistent with small renal cell carcinoma on prior MRI. 6. No evidence of bowel obstruction or inflammation. 7. Aortic atherosclerosis. 8. Superior endplate compression at L1 is new since prior study and may represent acute fracture.   Tx mag iv , ns , KCL iv   Review of  Systems: As per HPI otherwise 10 point review of systems negative.   Past Medical History:  Diagnosis Date   Allergic rhinitis    Anemia    Arthritis    probably in hands (07/17/2016)   CKD (chronic kidney disease), stage III (HCC)    Depression    Esophageal reflux    Family history of adverse reaction to anesthesia    her father had some issues when he had his gallbladder taken out when he was in his late 32s   GERD (gastroesophageal reflux disease)    Glaucoma    open angle, bilateral   High cholesterol    History of kidney stones    Hypercholesterolemia    Hypertension    Kidney cysts    Lumbago with sciatica, right side    Sepsis (HCC) 07/2016   d/t flu   Type II diabetes mellitus (HCC)    Vitamin D deficiency     Past Surgical History:  Procedure Laterality Date   BREAST BIOPSY  1990   ? side; it wasn't anything   BRONCHIAL BIOPSY  08/01/2021   Procedure: BRONCHIAL BIOPSIES;  Surgeon: Brenna Adine CROME, DO;  Location: [facility] ENDOSCOPY;  Service: Pulmonary;;   BRONCHIAL NEEDLE ASPIRATION BIOPSY  08/01/2021   Procedure: BRONCHIAL NEEDLE ASPIRATION BIOPSIES;  Surgeon: Brenna Adine CROME, DO;  Location: [facility] ENDOSCOPY;  Service: Pulmonary;;   CATARACT EXTRACTION, BILATERAL  2021   CHOLECYSTECTOMY OPEN  1971   COLONOSCOPY     2001,2011,2023   ESOPHAGOGASTRODUODENOSCOPY (EGD) WITH PROPOFOL  N/A 11/10/2022   Procedure: ESOPHAGOGASTRODUODENOSCOPY (  EGD) WITH PROPOFOL ;  Surgeon: Burnette Fallow, MD;  Location: [facility]OSCOPY;  Service: Gastroenterology;  Laterality: N/A;   TUBAL LIGATION  1978   VIDEO BRONCHOSCOPY WITH RADIAL ENDOBRONCHIAL ULTRASOUND  08/01/2021   Procedure: RADIAL ENDOBRONCHIAL ULTRASOUND;  Surgeon: Brenna Adine CROME, DO;  Location: [facility] ENDOSCOPY;  Service: Pulmonary;;     reports that she has never smoked. She has never used smokeless tobacco. She reports that she does not drink alcohol and does not use drugs.  Allergies  Allergen Reactions   Codeine Hypertension    Alprazolam  Other (See Comments)    lethargic    Family History  Problem Relation Age of Onset   Dementia Mother    Diabetes Mother    Stroke Father    Hypertension Father    CVA Father    Diabetes Sister    Hypertension Sister    Heart disease Paternal Grandfather    Breast cancer Neg Hx     Prior to Admission medications   Medication Sig Start Date End Date Taking? Authorizing Provider  potassium chloride  (KLOR-CON ) 20 MEQ packet Take 20 mEq by mouth daily. 02/20/24  Yes [provider]  acetaminophen  (TYLENOL ) 650 MG CR tablet Take 650 mg by mouth every 8 (eight) hours as needed for pain.    [provider]  albuterol  (VENTOLIN  HFA) 108 (90 Base) MCG/ACT inhaler Inhale into the lungs every 6 (six) hours as needed for wheezing or shortness of breath.    [provider]  amLODipine  (NORVASC ) 5 MG tablet Take 5 mg by mouth daily. 01/20/20   [provider]  APIXABAN  (ELIQUIS ) VTE STARTER PACK (10MG  AND 5MG ) Take as directed on package: start with two-5mg  tablets twice daily for 7 days. On day 8, switch to one-5mg  tablet twice daily. 09/09/23   Heilingoetter, Cassandra L, PA-C  Artificial Tear Solution (SYSTANE CONTACTS OP) Apply 0.4 drops to eye as needed.    [provider]  Blood Glucose Monitoring Suppl (FREESTYLE LITE) DEVI USE UTD 07/26/16   [provider]  calcium  carbonate (TUMS - DOSED IN MG ELEMENTAL CALCIUM ) 500 MG chewable tablet Chew 1 tablet by mouth daily.    [provider]  DULoxetine  HCl 40 MG CPEP Take 1 capsule by mouth daily. 12/10/22   [provider]  ferrous sulfate 325 (65 FE) MG EC tablet Take 325 mg by mouth daily.    [provider]  FREESTYLE LITE test strip USE TO CHECK FASTING GLUCOSE IN THE MORNING AND 2 HOURS AFTER DINNER OR LUNCH 07/26/16   [provider]  furosemide  (LASIX ) 20 MG tablet Take 1 tablet (20 mg total) by mouth as needed for fluid or edema (swelling). Pt  needs to keep upcoming appt in Oct for further refills 01/13/24   O'Neal, Darryle Ned, MD  gabapentin  (NEURONTIN ) 300 MG capsule Take 300 mg by mouth every evening. 01/01/20   [provider]  glipiZIDE  (GLUCOTROL  XL) 5 MG 24 hr tablet Take 5 mg by mouth daily with breakfast. 08/13/16   [provider]  Lancets (FREESTYLE) lancets  07/26/16   [provider]  latanoprost  (XALATAN ) 0.005 % ophthalmic solution Place 1 drop into the right eye at bedtime. 09/21/22   [provider]  loratadine (CLARITIN) 10 MG tablet Take 10 mg by mouth daily as needed for allergies.    [provider]  LORazepam  (ATIVAN ) 0.5 MG tablet Take 0.5 mg by mouth every 8 (eight) hours.    [provider]  losartan  (  COZAAR ) 100 MG tablet Take 0.5 tablets (50 mg total) by mouth daily. 10/21/22   Drusilla Sabas RAMAN, MD  magnesium  oxide (MAG-OX) 400 (240 Mg) MG tablet Take 2 tablets (800 mg total) by mouth 2 (two) times daily. 02/12/24   Emil Share, DO  ondansetron  (ZOFRAN ) 8 MG tablet Take 8 mg by mouth every 8 (eight) hours as needed for nausea/vomiting. 09/14/21   [provider]  oxymetazoline (AFRIN) 0.05 % nasal spray Place 1 spray into both nostrils 2 (two) times daily.    [provider]  pantoprazole  (PROTONIX ) 40 MG tablet Take 1 tablet (40 mg total) by mouth 2 (two) times daily before a meal. 11/13/22 01/12/23  Elgergawy, Brayton RAMAN, MD  QUEtiapine  (SEROQUEL ) 25 MG tablet Take 12.5 mg by mouth every other day.    [provider]  vitamin B-12 (CYANOCOBALAMIN ) 1000 MCG tablet Take 1 tablet (1,000 mcg total) by mouth daily. 10/29/21   Caleen Burgess BROCKS, MD    Physical Exam: Vitals:   03/05/24 2125 03/05/24 2343 03/06/24 0032 03/06/24 0117  BP:   (!) 158/82 (!) 139/93  Pulse:   (!) 54 64  Resp:   15 19  Temp: 98.4 F (36.9 C)  98.4 F (36.9 C) 97.7 F (36.5 C)  TempSrc: Oral  Oral Oral  SpO2:  96% 97% 94%    Constitutional: NAD, calm, comfortable Vitals:    03/05/24 2125 03/05/24 2343 03/06/24 0032 03/06/24 0117  BP:   (!) 158/82 (!) 139/93  Pulse:   (!) 54 64  Resp:   15 19  Temp: 98.4 F (36.9 C)  98.4 F (36.9 C) 97.7 F (36.5 C)  TempSrc: Oral  Oral Oral  SpO2:  96% 97% 94%   Eyes: PERRL, lids and conjunctivae normal ENMT: Mucous membranes are moist. Posterior pharynx clear of any exudate or lesions.Normal dentition.  Neck: normal, supple, no masses, no thyromegaly Respiratory: clear to auscultation bilaterally, no wheezing, no crackles. Normal respiratory effort. No accessory muscle use.  Cardiovascular: Regular rate and rhythm, no murmurs / rubs / gallops. No extremity edema. 2+ pedal pulses.  Abdomen: no tenderness, no masses palpated. No hepatosplenomegaly. Bowel sounds positive.  Musculoskeletal: no clubbing / cyanosis. No joint deformity upper and lower extremities. Good ROM, no contractures. Normal muscle tone.  Skin: no rashes, lesions, ulcers. No induration Neurologic: CN 2-12 grossly intact. Sensation intact, Strength 5/5 in all 4.  Psychiatric: Normal judgment and insight. Alert and oriented x 3. Normal mood.    Labs on Admission: I have personally reviewed following labs and imaging studies  CBC: Recent Labs  Lab 03/05/24 1500  WBC 8.9  HGB 14.2  HCT 41.0  MCV 91.3  PLT 346   Basic Metabolic Panel: Recent Labs  Lab 03/05/24 1453 03/05/24 1500  NA  --  137  K  --  3.0*  CL  --  99  CO2  --  20*  GLUCOSE  --  253*  BUN  --  20  CREATININE  --  1.17*  CALCIUM   --  8.8*  MG  --  0.7*  PHOS 2.5  --    GFR: CrCl cannot be calculated (Unknown ideal weight.). Liver Function Tests: Recent Labs  Lab 03/05/24 1500  AST 22  ALT 18  ALKPHOS 109  BILITOT 0.6  PROT 7.0  ALBUMIN 4.1   Recent Labs  Lab 03/05/24 1500  LIPASE 52*   No results for input(s): AMMONIA in the last 168 hours. Coagulation Profile: No  results for input(s): INR, PROTIME in the last 168 hours. Cardiac Enzymes: No  results for input(s): CKTOTAL, CKMB, CKMBINDEX, TROPONINI in the last 168 hours. BNP (last 3 results) No results for input(s): PROBNP in the last 8760 hours. HbA1C: No results for input(s): HGBA1C in the last 72 hours. CBG: Recent Labs  Lab 03/05/24 1529  GLUCAP 234*   Lipid Profile: No results for input(s): CHOL, HDL, LDLCALC, TRIG, CHOLHDL, LDLDIRECT in the last 72 hours. Thyroid  Function Tests: Recent Labs    03/05/24 1540  TSH 1.670   Anemia Panel: No results for input(s): VITAMINB12, FOLATE, FERRITIN, TIBC, IRON, RETICCTPCT in the last 72 hours. Urine analysis:    Component Value Date/Time   COLORURINE COLORLESS (A) 03/05/2024 1832   APPEARANCEUR CLEAR 03/05/2024 1832   LABSPEC 1.026 03/05/2024 1832   PHURINE 5.5 03/05/2024 1832   GLUCOSEU NEGATIVE 03/05/2024 1832   HGBUR NEGATIVE 03/05/2024 1832   BILIRUBINUR NEGATIVE 03/05/2024 1832   KETONESUR NEGATIVE 03/05/2024 1832   PROTEINUR TRACE (A) 03/05/2024 1832   NITRITE NEGATIVE 03/05/2024 1832   LEUKOCYTESUR NEGATIVE 03/05/2024 1832    Radiological Exams on Admission: DG Chest 2 View Result Date: 03/05/2024 CLINICAL DATA:  Tachypnea and hypoxia. EXAM: CHEST - 2 VIEW COMPARISON:  Chest x-ray 11/08/2022.  CT of the chest 09/09/2023. FINDINGS: There are increased central interstitial markings bilaterally. Previously identified small nodular densities are not well defined on this x-ray. There is a large hiatal hernia, unchanged. There is no pleural effusion or pneumothorax. IMPRESSION: 1. Increased central interstitial markings bilaterally, which may represent pulmonary edema or atypical infection. 2. Large hiatal hernia. Electronically Signed   By: Greig Pique M.D.   On: 03/05/2024 17:53   CT ABDOMEN PELVIS W CONTRAST Result Date: 03/05/2024 CLINICAL DATA:  Diarrhea. Worsening altered mental status. Diarrhea for 2 days. Nausea starting today. EXAM: CT ABDOMEN AND PELVIS WITH CONTRAST  TECHNIQUE: Multidetector CT imaging of the abdomen and pelvis was performed using the standard protocol following bolus administration of intravenous contrast. RADIATION DOSE REDUCTION: This exam was performed according to the departmental dose-optimization program which includes automated exposure control, adjustment of the mA and/or kV according to patient size and/or use of iterative reconstruction technique. CONTRAST:  OMNIPAQUE  IOHEXOL  300 MG/ML  SOLN COMPARISON:  Chest radiograph 02/11/2024. MRI abdomen 10/21/2023. CT abdomen and pelvis 11/08/2022 FINDINGS: Lower chest: Numerous pulmonary nodules demonstrated in both lung bases. Nodules were present on the previous study but appear more numerous today. This is likely metastatic. Large esophageal hiatal hernia. Hepatobiliary: Mild diffuse fatty infiltration of the liver. Focal lesion centrally in the liver measuring 2.3 cm diameter. This has heterogeneous mostly hypodense appearance. No change since prior study. On prior MRI, this area demonstrates signal loss on out of phase T1 weighted images, likely indicating focal fatty infiltration. Gallbladder is surgically absent. No bile duct dilatation. Pancreas: Diffuse fatty atrophy of the pancreas. No acute abnormalities. Spleen: Normal in size without focal abnormality. Adrenals/Urinary Tract: No adrenal gland nodules. Renal parenchymal atrophy and scarring bilaterally. Left intrarenal stone measuring 3 mm diameter. Focal hypoenhancing lesion again demonstrated in the left kidney measuring 2.4 cm diameter. This was felt consistent with renal cell carcinoma on prior MRI characterization. No hydronephrosis or hydroureter. Bladder is normal. Stomach/Bowel: Stomach, small bowel, and colon are not abnormally distended. No wall thickening or inflammatory stranding. Sigmoid colonic diverticula without evidence of acute diverticulitis. Small duodenal diverticulum. Appendix is normal. Vascular/Lymphatic: Aortic  atherosclerosis. No enlarged abdominal or pelvic lymph nodes. Reproductive: Uterus  and bilateral adnexa are unremarkable. Other: No abdominal wall hernia or abnormality. No abdominopelvic ascites. Musculoskeletal: Degenerative changes in the spine. Compression of the superior endplate of L1 is new since prior study (CT abdomen and pelvis 11/08/2022 and lumbar spine radiographs 01/27/2024) and may represent acute compression. No destructive bone lesions are identified. Degenerative changes in the hips. IMPRESSION: 1. Numerous pulmonary nodules in the lung bases appear more numerous since on prior study. This is likely metastatic. 2. Large esophageal hiatal hernia. 3. Focal area of fatty infiltration demonstrated in the central liver. 4. Nonobstructing stone in the left kidney. 5. Hypoenhancing left renal mass measuring 2.4 cm diameter. This was present previously and felt to be consistent with small renal cell carcinoma on prior MRI. 6. No evidence of bowel obstruction or inflammation. 7. Aortic atherosclerosis. 8. Superior endplate compression at L1 is new since prior study and may represent acute fracture. Electronically Signed   By: Elsie Gravely M.D.   On: 03/05/2024 17:52    EKG: Independently reviewed.   Assessment/Plan  Severe hypomagnesemia due to gi losses - admit to progressive care  - continue on magnesium  replacement   Hypokalemia -replete prn   Diarrhea  - resolved - imaging notes no acute findings of inflammation in gi tract --supportive care with ivfs  - if diarrhea persist consider trial of octreotide   Mobitz type 1 - intermittent  -ensure lytes are stable  - asymptomatic, consider further evaluation by cardiology   Stage IV carcinoid tumor with mets  - followed by oncology  -imaging does note progressive metastasis  -followed by Dr Sherrod on surveillance   Change in mental status  - due to electrolyte abnormalities /dehydration  - supportive care , treat  underlying cause  -appears to be returning to baseline -CTH to be complete   Superior endplate compression at L1 is  -new since prior study  -supportive care  - no current complaints   DMII -iss/fs   CKDIIIa - cr close to baseline  -monitor closely insetting of gi losses   CHF pef  -grade 1 diastolic dysfunction - no active issues   HTN  -resume home regimen  -once med rec completed   Anemia  -stable   Hx of PUD - no active issues   Depression  Anxiety  Dementia -resume home regimen once med rec completed   GERD -ppi       DVT prophylaxis: resume Eliquis   Code Status: full/ as discussed per patient wishes in event of cardiac arrest  Family Communication:  none at bedside Disposition Plan: patient  expected to be admitted greater than 2 midnights  Consults called:  please call oncology consult in am  Admission status: progressive care    Camila DELENA Ned MD [facility]   If 7PM-7AM, please contact night-coverage [email or web address] Password [email or web address]  03/06/2024, 4:03 AM

## 2024-03-06 NOTE — Plan of Care (Signed)
 Pt ambulated four times to the bathroom and back to the bed today.  She had a BM and four urination episodes.  Pt sat on the side of her bed to eat dinner.

## 2024-03-06 NOTE — Progress Notes (Signed)
   03/06/24 1050  TOC Brief Assessment  Insurance and Status Reviewed  Patient has primary care physician Yes  Home environment has been reviewed Resides with daughter in single family home  Prior level of function: Performs ADLs  Prior/Current Home Services No current home services  Social Drivers of Health Review SDOH reviewed no interventions necessary  Readmission risk has been reviewed Yes  Transition of care needs no transition of care needs at this time

## 2024-03-06 NOTE — Progress Notes (Addendum)
 Michaela Santana   DOB:06-25-1945   FM#:[id number]      ASSESSMENT & PLAN:  Michaela Santana is a 77 year old female patient with oncologic history significant for carcinoid tumor. Came to ED 9/26 with complaints of diarrhea, dehydration, confusion, and abnormal electrolytes. Follows with Medical Oncology/Dr. Sherrod.    Low-grade neuroendocrine carcinoma (carcinoid tumor), Stage IV, ([id number]) -- Diagnosed 08/2021.  Innumerable bilateral pulmonary nodules.  -- Recent restaging imaging 09/09/23 revealed stable nodules with no growth or spread of disease.  -- Patient has been on observation and was not placed on treatment/ocreotide per NCCN guideline as she had no symptoms and stable disease.  -- CT done 9/26 shows numerous pulmonary nodules more numerous since prior study.  -- Medical Oncology/Dr. Sherrod has been following, last outpatient oncology visit on 09/19/23. -- Oncology/Dr. Lanny covering at this time and will make further treatment recommendation.  History of PE -- Incidental finding on 09/09/23  -- Started on Eliquis  with plan for 6 mo scan which is scheduled for 9/29.  Renal cell carcinoma -- Hypoenhancing left renal mass 2.4 cm.  Per CT imaging 9/26 felt to be consistent with small renal cell carcinoma.  Hypokalemia Hypomagnesemia -- PCP has been following low mag and patient has been on oral mag 1000 mg twice/daily and on topical mag. -- Monitor electrolytes closely and replete as ordered.   Altered mental status History of dementia -- patient alert, very pleasant.  Caregiver at bedside. Patient states wants to go home.   -- Continue supportive care  Hypertension Diabetes  -- Continue to monitor BP closely, anti-hypertensives per orders -- Insulin  per coverage protocol    Code Status Full   Subjective:  Patient seen awake and alert laying in bed. Reports that diarrhea is gone and that she feels better. Wants to go home.  Caregiver at bedside who states that patient  has history of dementia and gets more confused when mag is low, has been following with PCP for low mag and is on oral and topical mag.  Denies pain.  Agreeable to outpatient oncology follow up with dr. Sherrod on discharge.   Objective:   Intake/Output Summary (Last 24 hours) at 03/06/2024 1130 Last data filed at 03/06/2024 0600 Gross per 24 hour  Intake 760.66 ml  Output --  Net 760.66 ml     PHYSICAL EXAMINATION: ECOG PERFORMANCE STATUS: 3 - Symptomatic, >50% confined to bed  Vitals:   03/06/24 0522 03/06/24 0955  BP: (!) 147/74 137/62  Pulse: 60 (!) 55  Resp: 18 (!) 24  Temp: 97.8 F (36.6 C)   SpO2: 94% 94%   Filed Weights   03/06/24 0612  Weight: 175 lb 4.3 oz (79.5 kg)    GENERAL: alert, no distress and comfortable SKIN: skin color, texture, turgor are normal, no rashes or significant lesions EYES: normal, conjunctiva are pink and non-injected, sclera clear OROPHARYNX: no exudate, no erythema and lips, buccal mucosa, and tongue normal  NECK: supple, thyroid  normal size, non-tender, without nodularity LYMPH: no palpable lymphadenopathy in the cervical, axillary or inguinal LUNGS: clear to auscultation and percussion with normal breathing effort HEART: regular rate & rhythm and no murmurs and no lower extremity edema ABDOMEN: abdomen soft, non-tender and normal bowel sounds MUSCULOSKELETAL: no cyanosis of digits and no clubbing  PSYCH: alert & oriented x 3 with fluent speech NEURO: no focal motor/sensory deficits   All questions were answered. The patient knows to call the clinic with any problems, questions or concerns.  The total time spent in the appointment was 40 minutes encounter with patient including review of chart and various tests results, discussions about plan of care and coordination of care plan  Olam JINNY Brunner, NP 03/06/2024 11:30 AM    Labs Reviewed:  Lab Results  Component Value Date   WBC 8.0 03/06/2024   HGB 14.0 03/06/2024   HCT 41.3  03/06/2024   MCV 92.8 03/06/2024   PLT 312 03/06/2024   Recent Labs    02/12/24 1230 03/05/24 1500 03/06/24 0455  NA 140 137 138  K 3.3* 3.0* 3.2*  CL 102 99 102  CO2 22 20* 22  GLUCOSE 193* 253* 161*  BUN 23 20 16   CREATININE 1.28* 1.17* 1.02*  CALCIUM  8.0* 8.8* 8.7*  GFRNONAA 43* 48* 56*  PROT 6.3* 7.0 6.7  ALBUMIN 3.8 4.1 4.0  AST 35 22 24  ALT 26 18 20   ALKPHOS 99 109 106  BILITOT 0.6 0.6 0.9    Studies Reviewed:  CT HEAD WO CONTRAST ( ) Result Date: 03/06/2024 EXAM: CT HEAD WITHOUT CONTRAST 03/06/2024 09:27:55 AM TECHNIQUE: CT of the head was performed without the administration of intravenous contrast. Automated exposure control, iterative reconstruction, and/or weight based adjustment of the mA/kV was utilized to reduce the radiation dose to as low as reasonably achievable. COMPARISON: CT of the head dated 10/20/2022. CLINICAL HISTORY: Mental status change, unknown cause. Abnormal Lab; Altered Mental Status; Diarrhea. FINDINGS: BRAIN AND VENTRICLES: No acute hemorrhage. No evidence of acute infarct. No hydrocephalus. No extra-axial collection. No mass effect or midline shift. There is age-related cerebral and cerebellar volume loss and mild-to-moderate periventricular white matter disease. ORBITS: No acute abnormality. The patient is status post bilateral lens replacement. SINUSES: No acute abnormality. SOFT TISSUES AND SKULL: No acute soft tissue abnormality. No skull fracture. IMPRESSION: 1. No acute intracranial abnormality. 2. Age-related cerebral and cerebellar volume loss with mild-to-moderate chronic microvascular ischemic changes. Electronically signed by: Evalene Coho MD 03/06/2024 09:59 AM EDT RP Workstation: [id number]   DG Chest 2 View Result Date: 03/05/2024 CLINICAL DATA:  Tachypnea and hypoxia. EXAM: CHEST - 2 VIEW COMPARISON:  Chest x-ray 11/08/2022.  CT of the chest 09/09/2023. FINDINGS: There are increased central interstitial markings bilaterally.  Previously identified small nodular densities are not well defined on this x-ray. There is a large hiatal hernia, unchanged. There is no pleural effusion or pneumothorax. IMPRESSION: 1. Increased central interstitial markings bilaterally, which may represent pulmonary edema or atypical infection. 2. Large hiatal hernia. Electronically Signed   By: Greig Pique M.D.   On: 03/05/2024 17:53   CT ABDOMEN PELVIS W CONTRAST Result Date: 03/05/2024 CLINICAL DATA:  Diarrhea. Worsening altered mental status. Diarrhea for 2 days. Nausea starting today. EXAM: CT ABDOMEN AND PELVIS WITH CONTRAST TECHNIQUE: Multidetector CT imaging of the abdomen and pelvis was performed using the standard protocol following bolus administration of intravenous contrast. RADIATION DOSE REDUCTION: This exam was performed according to the departmental dose-optimization program which includes automated exposure control, adjustment of the mA and/or kV according to patient size and/or use of iterative reconstruction technique. CONTRAST:  OMNIPAQUE  IOHEXOL  300 MG/ML  SOLN COMPARISON:  Chest radiograph 02/11/2024. MRI abdomen 10/21/2023. CT abdomen and pelvis 11/08/2022 FINDINGS: Lower chest: Numerous pulmonary nodules demonstrated in both lung bases. Nodules were present on the previous study but appear more numerous today. This is likely metastatic. Large esophageal hiatal hernia. Hepatobiliary: Mild diffuse fatty infiltration of the liver. Focal lesion centrally in the liver measuring 2.3 cm diameter. This  has heterogeneous mostly hypodense appearance. No change since prior study. On prior MRI, this area demonstrates signal loss on out of phase T1 weighted images, likely indicating focal fatty infiltration. Gallbladder is surgically absent. No bile duct dilatation. Pancreas: Diffuse fatty atrophy of the pancreas. No acute abnormalities. Spleen: Normal in size without focal abnormality. Adrenals/Urinary Tract: No adrenal gland nodules. Renal  parenchymal atrophy and scarring bilaterally. Left intrarenal stone measuring 3 mm diameter. Focal hypoenhancing lesion again demonstrated in the left kidney measuring 2.4 cm diameter. This was felt consistent with renal cell carcinoma on prior MRI characterization. No hydronephrosis or hydroureter. Bladder is normal. Stomach/Bowel: Stomach, small bowel, and colon are not abnormally distended. No wall thickening or inflammatory stranding. Sigmoid colonic diverticula without evidence of acute diverticulitis. Small duodenal diverticulum. Appendix is normal. Vascular/Lymphatic: Aortic atherosclerosis. No enlarged abdominal or pelvic lymph nodes. Reproductive: Uterus and bilateral adnexa are unremarkable. Other: No abdominal wall hernia or abnormality. No abdominopelvic ascites. Musculoskeletal: Degenerative changes in the spine. Compression of the superior endplate of L1 is new since prior study (CT abdomen and pelvis 11/08/2022 and lumbar spine radiographs 01/27/2024) and may represent acute compression. No destructive bone lesions are identified. Degenerative changes in the hips. IMPRESSION: 1. Numerous pulmonary nodules in the lung bases appear more numerous since on prior study. This is likely metastatic. 2. Large esophageal hiatal hernia. 3. Focal area of fatty infiltration demonstrated in the central liver. 4. Nonobstructing stone in the left kidney. 5. Hypoenhancing left renal mass measuring 2.4 cm diameter. This was present previously and felt to be consistent with small renal cell carcinoma on prior MRI. 6. No evidence of bowel obstruction or inflammation. 7. Aortic atherosclerosis. 8. Superior endplate compression at L1 is new since prior study and may represent acute fracture. Electronically Signed   By: Elsie Gravely M.D.   On: 03/05/2024 17:52   MM 3D SCREENING MAMMOGRAM BILATERAL BREAST Result Date: 02/27/2024 CLINICAL DATA:  Screening. EXAM: DIGITAL SCREENING BILATERAL MAMMOGRAM WITH TOMOSYNTHESIS  AND CAD TECHNIQUE: Bilateral screening digital craniocaudal and mediolateral oblique mammograms were obtained. Bilateral screening digital breast tomosynthesis was performed. The images were evaluated with computer-aided detection. COMPARISON:  Previous exam(s). ACR Breast Density Category b: There are scattered areas of fibroglandular density. FINDINGS: There are no findings suspicious for malignancy. IMPRESSION: No mammographic evidence of malignancy. A result letter of this screening mammogram will be mailed directly to the patient. RECOMMENDATION: Screening mammogram in one year. (Code:SM-B-01Y) BI-RADS CATEGORY  1: Negative. Electronically Signed   By: Alm Parkins M.D.   On: 02/27/2024 13:41     ADDENDUM I have seen the patient, examined her. I agree with the assessment and and plan and have edited the notes.   Pt has metastatic carcinoma in her lungs, she was previously asymptomatic and under active surveillance.  She states that she has mild chronic diarrhea, but much worse for a few days before she was not admitted.  Stool C. difficile is negative, the rest of the GI panel still pending.  Her diarrhea has improved, if GI panel is negative, I recommend Imodium  and Lomotil as needed.  Not sure if this is acute event, versus carcinoid tumor related diarrhea.  I do not plan to start octreotide injection in the hospital, as her diarrhea has improved, and she has not tried any Imodium  or Lomotil.  She has a follow-up with Dr. Sherrod in 2 weeks, certainly if she has persistent diarrhea, it would be reasonable to start Sandostatin or lanreotide injection in outpatient  setting.  All questions were answered. Pt agrees with the plan.   Onita Mattock MD 03/06/2024

## 2024-03-06 NOTE — ED Notes (Signed)
[facility] is here for transport.

## 2024-03-06 NOTE — Progress Notes (Signed)
 [facility]                                                                              Michaela Santana, is a 78 y.o. female, DOB - 02-Jun-1946, FMW:[id number] Admit date - 03/05/2024    Outpatient Primary MD for the patient is Chrystal, Lamarr RAMAN, MD  LOS - 0  days  Chief Complaint  Patient presents with   Abnormal Lab   Altered Mental Status   Diarrhea       Brief summary   Patient is a 78 year old female with history of CKD stage IIIa, GERD, chronic hypomagnesemia, 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 Sherrod hypertension, subacute PE diagnosed on September 09, 2023, was placed on Eliquis  for 3 to 6 months, mild dementia, anxiety, depression, anemia, second-degree AV block type I, presented to ED with 2 days of diarrhea, worsening confusion and critically low magnesium .  No abdominal pain, fevers or chills nausea vomiting.   Assessment & Plan    Severe  Hypomagnesemia -Presented with magnesium  0.7 on admission - IV replacement, magnesium  1.5 today, will continue IV replacement 4 g IV x 1   Hypokalemia - Potassium 3.0 on admission  - Still low, K-Dur 40 meq x 1  Diarrhea secondary to neuroendocrine/carcinoid tumor  Lung cancer, primary, 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, left [facility]), neuroendocrine tumor -Outpatient following with Dr. Sherrod, patient requested CT chest with contrast and other labs to be done inpatient - Follow CT chest. - Requested oncology consultation, if patient needs octreotide    Type II diabetes mellitus (HCC) -Continue sliding scale insulin  while inpatient.  Hold glipizide   Pulmonary embolism Diagnosed in March 2025, was placed on Eliquis  for 3 to 6 months - Continue Eliquis   Chronic kidney disease stage IIIa - Creatinine currently at baseline  Depression, anxiety, dementia - Resume Ativan  as needed, Seroquel  12.5 mg every other day  GERD -  Continue PPI  Hypertension - Continue Norvasc   Obesity class I Estimated body mass index is 33.12 kg/m as calculated from the following:   Height as of this encounter: 5' 1 (1.549 m).   Weight as of this encounter: 79.5 kg.  Code Status: Full code DVT Prophylaxis:   Apixaban  Starter Pack (10mg  and 5mg ) (ELIQUIS  STARTER PACK) 5 mg   Level of Care: Level of care: Telemetry Family Communication: Updated patient's daughter and caregiver at the bedside Disposition Plan:      Remains inpatient appropriate:      Procedures:    Consultants:   Oncology  Antimicrobials:   Anti-infectives (From admission, onward)    None          Medications  amLODipine   5 mg Oral Daily   Apixaban  Starter Pack (10mg  and 5mg )  5 mg Oral BID   insulin  aspart  0-6 Units Subcutaneous TID WC   pantoprazole   40 mg Oral BID AC      Subjective:   Monic Engelmann was seen and examined today.  Appears dehydrated, family at the bedside.  No acute nausea vomiting, fevers or chills, abdominal pain.  Diarrhea improving.   Objective:  Vitals:   03/06/24 0522 03/06/24 0612 03/06/24 0955 03/06/24 1207  BP: (!) 147/74  137/62 123/78  Pulse: 60  (!) 55 (!) 57  Resp: 18  (!) 24 17  Temp: 97.8 F (36.6 C)     TempSrc: Oral     SpO2: 94%  94% 96%  Weight:  79.5 kg    Height:  5' 1 (1.549 m)      Intake/Output Summary (Last 24 hours) at 03/06/2024 1216 Last data filed at 03/06/2024 0600 Gross per 24 hour  Intake 760.66 ml  Output --  Net 760.66 ml     Wt Readings from Last 3 Encounters:  03/06/24 79.5 kg  01/27/24 83 kg  09/19/23 82.8 kg     Exam General: Alert and oriented, NAD Cardiovascular: S1 S2 auscultated,  RRR Respiratory: Clear to auscultation bilaterally, no wheezing Gastrointestinal: Soft, nontender, nondistended, + bowel sounds Ext: no pedal edema bilaterally Neuro: No new deficits Psych: Normal affect     Data Reviewed:  I have personally reviewed following labs     CBC Lab Results  Component Value Date   WBC 8.0 03/06/2024   RBC 4.45 03/06/2024   HGB 14.0 03/06/2024   HCT 41.3 03/06/2024   MCV 92.8 03/06/2024   MCH 31.5 03/06/2024   PLT 312 03/06/2024   MCHC 33.9 03/06/2024   RDW 12.9 03/06/2024   LYMPHSABS 1.6 02/12/2024   MONOABS 0.5 02/12/2024   EOSABS 0.5 02/12/2024   BASOSABS 0.1 02/12/2024     Last metabolic panel Lab Results  Component Value Date   NA 138 03/06/2024   K 3.2 (L) 03/06/2024   CL 102 03/06/2024   CO2 22 03/06/2024   BUN 16 03/06/2024   CREATININE 1.02 (H) 03/06/2024   GLUCOSE 161 (H) 03/06/2024   GFRNONAA 56 (L) 03/06/2024   GFRAA 29 (L) 07/21/2016   CALCIUM  8.7 (L) 03/06/2024   PHOS 2.5 03/05/2024   PROT 6.7 03/06/2024   ALBUMIN 4.0 03/06/2024   BILITOT 0.9 03/06/2024   ALKPHOS 106 03/06/2024   AST 24 03/06/2024   ALT 20 03/06/2024   ANIONGAP 14 03/06/2024    CBG (last 3)  Recent Labs    03/06/24 0520 03/06/24 0739 03/06/24 1203  GLUCAP 161* 195* 215*      Coagulation Profile: No results for input(s): INR, PROTIME in the last 168 hours.   Radiology Studies: I have personally reviewed the imaging studies  CT HEAD WO CONTRAST ( ) Result Date: 03/06/2024 EXAM: CT HEAD WITHOUT CONTRAST 03/06/2024 09:27:55 AM TECHNIQUE: CT of the head was performed without the administration of intravenous contrast. Automated exposure control, iterative reconstruction, and/or weight based adjustment of the mA/kV was utilized to reduce the radiation dose to as low as reasonably achievable. COMPARISON: CT of the head dated 10/20/2022. CLINICAL HISTORY: Mental status change, unknown cause. Abnormal Lab; Altered Mental Status; Diarrhea. FINDINGS: BRAIN AND VENTRICLES: No acute hemorrhage. No evidence of acute infarct. No hydrocephalus. No extra-axial collection. No mass effect or midline shift. There is age-related cerebral and cerebellar volume loss and mild-to-moderate periventricular white matter disease.  ORBITS: No acute abnormality. The patient is status post bilateral lens replacement. SINUSES: No acute abnormality. SOFT TISSUES AND SKULL: No acute soft tissue abnormality. No skull fracture. IMPRESSION: 1. No acute intracranial abnormality. 2. Age-related cerebral and cerebellar volume loss with mild-to-moderate chronic microvascular ischemic changes. Electronically signed by: Evalene Coho MD 03/06/2024 09:59 AM EDT RP Workstation: [id number]   DG Chest 2 View Result Date: 03/05/2024 CLINICAL DATA:  Tachypnea and hypoxia. EXAM: CHEST - 2 VIEW COMPARISON:  Chest x-ray 11/08/2022.  CT of the chest 09/09/2023. FINDINGS: There are increased central interstitial markings bilaterally. Previously identified small nodular densities are not well defined on this x-ray. There is a large hiatal hernia, unchanged. There is no pleural effusion or pneumothorax. IMPRESSION: 1. Increased central interstitial markings bilaterally, which may represent pulmonary edema or atypical infection. 2. Large hiatal hernia. Electronically Signed   By: Greig Pique M.D.   On: 03/05/2024 17:53   CT ABDOMEN PELVIS W CONTRAST Result Date: 03/05/2024 CLINICAL DATA:  Diarrhea. Worsening altered mental status. Diarrhea for 2 days. Nausea starting today. EXAM: CT ABDOMEN AND PELVIS WITH CONTRAST TECHNIQUE: Multidetector CT imaging of the abdomen and pelvis was performed using the standard protocol following bolus administration of intravenous contrast. RADIATION DOSE REDUCTION: This exam was performed according to the departmental dose-optimization program which includes automated exposure control, adjustment of the mA and/or kV according to patient size and/or use of iterative reconstruction technique. CONTRAST:  OMNIPAQUE  IOHEXOL  300 MG/ML  SOLN COMPARISON:  Chest radiograph 02/11/2024. MRI abdomen 10/21/2023. CT abdomen and pelvis 11/08/2022 FINDINGS: Lower chest: Numerous pulmonary nodules demonstrated in both lung bases. Nodules  were present on the previous study but appear more numerous today. This is likely metastatic. Large esophageal hiatal hernia. Hepatobiliary: Mild diffuse fatty infiltration of the liver. Focal lesion centrally in the liver measuring 2.3 cm diameter. This has heterogeneous mostly hypodense appearance. No change since prior study. On prior MRI, this area demonstrates signal loss on out of phase T1 weighted images, likely indicating focal fatty infiltration. Gallbladder is surgically absent. No bile duct dilatation. Pancreas: Diffuse fatty atrophy of the pancreas. No acute abnormalities. Spleen: Normal in size without focal abnormality. Adrenals/Urinary Tract: No adrenal gland nodules. Renal parenchymal atrophy and scarring bilaterally. Left intrarenal stone measuring 3 mm diameter. Focal hypoenhancing lesion again demonstrated in the left kidney measuring 2.4 cm diameter. This was felt consistent with renal cell carcinoma on prior MRI characterization. No hydronephrosis or hydroureter. Bladder is normal. Stomach/Bowel: Stomach, small bowel, and colon are not abnormally distended. No wall thickening or inflammatory stranding. Sigmoid colonic diverticula without evidence of acute diverticulitis. Small duodenal diverticulum. Appendix is normal. Vascular/Lymphatic: Aortic atherosclerosis. No enlarged abdominal or pelvic lymph nodes. Reproductive: Uterus and bilateral adnexa are unremarkable. Other: No abdominal wall hernia or abnormality. No abdominopelvic ascites. Musculoskeletal: Degenerative changes in the spine. Compression of the superior endplate of L1 is new since prior study (CT abdomen and pelvis 11/08/2022 and lumbar spine radiographs 01/27/2024) and may represent acute compression. No destructive bone lesions are identified. Degenerative changes in the hips. IMPRESSION: 1. Numerous pulmonary nodules in the lung bases appear more numerous since on prior study. This is likely metastatic. 2. Large esophageal  hiatal hernia. 3. Focal area of fatty infiltration demonstrated in the central liver. 4. Nonobstructing stone in the left kidney. 5. Hypoenhancing left renal mass measuring 2.4 cm diameter. This was present previously and felt to be consistent with small renal cell carcinoma on prior MRI. 6. No evidence of bowel obstruction or inflammation. 7. Aortic atherosclerosis. 8. Superior endplate compression at L1 is new since prior study and may represent acute fracture. Electronically Signed   By: Elsie Gravely M.D.   On: 03/05/2024 17:52       Delainee Tramel M.D. [facility] 03/06/2024, 12:16 PM  Available via Epic secure chat 7am-7pm After 7 pm, please refer to night coverage provider listed on amion.

## 2024-03-07 DIAGNOSIS — C3492 Malignant neoplasm of unspecified part of left bronchus or lung: Secondary | ICD-10-CM | POA: Diagnosis not present

## 2024-03-07 DIAGNOSIS — R197 Diarrhea, unspecified: Secondary | ICD-10-CM | POA: Diagnosis not present

## 2024-03-07 DIAGNOSIS — E1100 Type 2 diabetes mellitus with hyperosmolarity without nonketotic hyperglycemic-hyperosmolar coma (NKHHC): Secondary | ICD-10-CM | POA: Diagnosis not present

## 2024-03-07 DIAGNOSIS — I2699 Other pulmonary embolism without acute cor pulmonale: Secondary | ICD-10-CM

## 2024-03-07 LAB — GASTROINTESTINAL PANEL BY PCR, STOOL (REPLACES STOOL CULTURE)

## 2024-03-07 LAB — RENAL FUNCTION PANEL
Albumin: 3.6 g/dL (ref 3.5–5.0)
Anion gap: 13 (ref 5–15)
BUN: 11 mg/dL (ref 8–23)
CO2: 19 mmol/L — ABNORMAL LOW (ref 22–32)
Calcium: 8.3 mg/dL — ABNORMAL LOW (ref 8.9–10.3)
Chloride: 104 mmol/L (ref 98–111)
Creatinine, Ser: 0.82 mg/dL (ref 0.44–1.00)
GFR, Estimated: >60 mL/min (ref >60)
Glucose, Bld: 205 mg/dL — ABNORMAL HIGH (ref 70–99)
Phosphorus: 2 mg/dL — ABNORMAL LOW (ref 2.5–4.6)
Potassium: 3.8 mmol/L (ref 3.5–5.1)
Sodium: 135 mmol/L (ref 135–145)

## 2024-03-07 LAB — CBC
HCT: 41.3 % (ref 36.0–46.0)
Hemoglobin: 13.1 g/dL (ref 12.0–15.0)
MCH: 30.7 pg (ref 26.0–34.0)
MCHC: 31.7 g/dL (ref 30.0–36.0)
MCV: 96.7 fL (ref 80.0–100.0)
Platelets: 282 K/uL (ref 150–400)
RBC: 4.27 MIL/uL (ref 3.87–5.11)
RDW: 12.9 % (ref 11.5–15.5)
WBC: 7.3 K/uL (ref 4.0–10.5)
nRBC: 0 % (ref 0.0–0.2)

## 2024-03-07 LAB — DIFFERENTIAL
Abs Immature Granulocytes: 0.02 K/uL (ref 0.00–0.07)
Basophils Absolute: 0.1 K/uL (ref 0.0–0.1)
Basophils Relative: 1 %
Eosinophils Absolute: 0.5 K/uL (ref 0.0–0.5)
Eosinophils Relative: 6 %
Immature Granulocytes: 0 %
Lymphocytes Relative: 24 %
Lymphs Abs: 1.8 K/uL (ref 0.7–4.0)
Monocytes Absolute: 0.7 K/uL (ref 0.1–1.0)
Monocytes Relative: 9 %
Neutro Abs: 4.5 K/uL (ref 1.7–7.7)
Neutrophils Relative %: 60 %

## 2024-03-07 LAB — MAGNESIUM: Magnesium: 1.8 mg/dL (ref 1.7–2.4)

## 2024-03-07 LAB — GLUCOSE, CAPILLARY
Glucose-Capillary: 237 mg/dL — ABNORMAL HIGH (ref 70–99)
Glucose-Capillary: 240 mg/dL — ABNORMAL HIGH (ref 70–99)

## 2024-03-07 LAB — PHOSPHORUS: Phosphorus: 2 mg/dL — ABNORMAL LOW (ref 2.5–4.6)

## 2024-03-07 MED ORDER — OXYMETAZOLINE HCL 0.05 % NA SOLN: 1.0000 | Freq: Two times a day (BID) | NASAL | Status: AC | PRN

## 2024-03-07 MED ORDER — LOSARTAN POTASSIUM 50 MG PO TABS: 100.0000 mg | ORAL_TABLET | Freq: Every day | ORAL | Status: DC

## 2024-03-07 MED ORDER — LOPERAMIDE HCL 2 MG PO CAPS: 2.0000 mg | ORAL_CAPSULE | Freq: Three times a day (TID) | ORAL | Status: AC | PRN

## 2024-03-07 MED ORDER — K PHOS MONO-SOD PHOS DI & MONO 155-852-130 MG PO TABS: 500.0000 mg | ORAL_TABLET | Freq: Two times a day (BID) | ORAL | 0 refills | Status: AC

## 2024-03-07 MED ORDER — APIXABAN 5 MG PO TABS: 5.0000 mg | ORAL_TABLET | Freq: Two times a day (BID) | ORAL | 3 refills | Status: DC

## 2024-03-07 MED ORDER — ONDANSETRON HCL 8 MG PO TABS: 8.0000 mg | ORAL_TABLET | Freq: Three times a day (TID) | ORAL | 1 refills | Status: AC | PRN

## 2024-03-07 MED ORDER — K PHOS MONO-SOD PHOS DI & MONO 155-852-130 MG PO TABS
500.0000 mg | ORAL_TABLET | Freq: Two times a day (BID) | ORAL | Status: DC
  Administered 2024-03-07: 500 mg via ORAL
  Filled 2024-03-07: qty 2

## 2024-03-07 MED ORDER — PANTOPRAZOLE SODIUM 40 MG PO TBEC: 40.0000 mg | DELAYED_RELEASE_TABLET | Freq: Two times a day (BID) | ORAL | 3 refills | Status: AC

## 2024-03-07 NOTE — Discharge Summary (Addendum)
 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 Sherrod hypertension, subacute PE diagnosed on September 09, 2023, was placed on Eliquis  for 3 to 6 months, mild dementia, anxiety, depression, anemia, second-degree AV block type I, presented to ED with 2 days of diarrhea, worsening confusion and critically low magnesium . No abdominal pain, fevers or chills nausea vomiting.    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 Sherrod, patient requested CT chest with contrast to done inpatient - CT chest completed inpatient, she has appointment follow-up with Dr. Sherrod on 10/8.  Labs including complete metabolic panel, CBC with differential completed as requested by patient for upcoming appointment -Seen by oncology this admission, did not  recommend octreotide.  C. difficile is negative, -Started on Imodium  as needed use sparingly, diarrhea now improving.      Type II diabetes mellitus (HCC) - cont outpatient regimen   Pulmonary embolism Diagnosed in March 2025, was placed on Eliquis  for 3 to 6 months - Continue Eliquis  5 mg twice daily   Chronic kidney disease stage IIIa - Creatinine currently at baseline, creatinine 0.8 at discharge   Depression, anxiety, dementia - Resume Ativan  as needed, Seroquel     GERD - Continue PPI   Hypertension - Continue Norvasc    Obesity class I Estimated body mass index is 33.12 kg/m as calculated from the following:   Height as of this encounter: 5' 1 (1.549 m).   Weight as of this encounter: 79.5 kg.   Patient cleared for discharge, seen by physical therapy, will continue home health PT.  Management discussed with patient's daughter at the bedside.       Pain control - [place]  Controlled Substance Reporting System database was reviewed. and patient was instructed, not to drive, operate heavy machinery, perform activities at heights, swimming or participation in water activities or provide baby-sitting services while on Pain, Sleep and Anxiety Medications; until their outpatient Physician has advised to do so again. Also recommended to not to take more than prescribed Pain, Sleep and Anxiety Medications.  Consultants: Oncology Procedures performed: None Disposition: Home Diet recommendation:  Discharge Diet Orders (From admission, onward)     Start     Ordered   03/07/24 0000  Diet - low sodium heart healthy        03/07/24 1109           DISCHARGE MEDICATION: Allergies as of 03/07/2024       Reactions  Codeine Hypertension   Alprazolam  Other (See Comments)   lethargic        Medication List     STOP taking these medications    Apixaban  Starter Pack (10mg  and 5mg ) Commonly known as: ELIQUIS  STARTER PACK Replaced by: apixaban  5 MG Tabs tablet        TAKE these medications    acetaminophen  650 MG CR tablet Commonly known as: TYLENOL  Take 650 mg by mouth every 8 (eight) hours as needed for pain.   albuterol  108 (90 Base) MCG/ACT inhaler Commonly known as: VENTOLIN  HFA Inhale into the lungs every 6 (six) hours as needed for wheezing or shortness of breath.   amLODipine  5 MG tablet Commonly known as: NORVASC  Take 5 mg by mouth daily.   apixaban  5 MG Tabs tablet Commonly known as: ELIQUIS  Take 1 tablet (5 mg total) by mouth 2 (two) times daily. Replaces: Apixaban  Starter Pack (10mg  and 5mg )   calcium  carbonate 500 MG chewable tablet Commonly known as: TUMS - dosed in mg elemental calcium  Chew 1 tablet by mouth daily.   cyanocobalamin  1000 MCG tablet Commonly known as: VITAMIN B12 Take 1 tablet (1,000 mcg total) by mouth daily.   DULoxetine  HCl 40 MG Cpep Take 1 capsule by mouth daily.   ferrous sulfate 325 (65 FE) MG EC tablet Take 325 mg by mouth daily.   freestyle lancets   FreeStyle Lite Devi USE UTD   FREESTYLE LITE test strip Generic drug: glucose blood USE TO CHECK FASTING GLUCOSE IN THE MORNING AND 2 HOURS AFTER DINNER OR LUNCH   furosemide  20 MG tablet Commonly known as: LASIX  Take 1 tablet (20 mg total) by mouth as needed for fluid or edema (swelling). Pt needs to keep upcoming appt in Oct for further refills   gabapentin  300 MG capsule Commonly known as: NEURONTIN  Take 300 mg by mouth every evening.   glipiZIDE  5 MG 24 hr tablet Commonly known as: GLUCOTROL  XL Take 5 mg by mouth daily with breakfast.   latanoprost  0.005 % ophthalmic solution Commonly known as: XALATAN  Place 1 drop into the right eye at bedtime.   loperamide  2 MG capsule Commonly known as: IMODIUM  Take 1 capsule (2 mg total) by mouth every 8 (eight) hours as needed for diarrhea or loose stools.   loratadine 10 MG tablet Commonly known as: CLARITIN Take 10 mg by mouth daily as needed for allergies.   LORazepam  0.5 MG  tablet Commonly known as: ATIVAN  Take 0.5 mg by mouth every 8 (eight) hours.   losartan  100 MG tablet Commonly known as: COZAAR  Take 0.5 tablets (50 mg total) by mouth daily.   magnesium  oxide 400 (240 Mg) MG tablet Commonly known as: MAG-OX Take 2 tablets (800 mg total) by mouth 2 (two) times daily.   ondansetron  8 MG tablet Commonly known as: ZOFRAN  Take 1 tablet (8 mg total) by mouth every 8 (eight) hours as needed. What changed: reasons to take this   oxymetazoline 0.05 % nasal spray Commonly known as: AFRIN Place 1 spray into both nostrils 2 (two) times daily as needed for congestion. What changed:  when to take this reasons to take this   pantoprazole  40 MG tablet Commonly known as: PROTONIX  Take 1 tablet (40 mg total) by mouth 2 (two) times daily before a meal.   phosphorus 155-852-130 MG tablet Commonly known as: K PHOS NEUTRAL Take 2 tablets (500 mg total) by mouth 2 (two) times daily for 7 days.   potassium chloride  20 MEQ  packet Commonly known as: KLOR-CON  Take 20 mEq by mouth daily.   QUEtiapine  25 MG tablet Commonly known as: SEROQUEL  Take 12.5 mg by mouth every other day.   SYSTANE CONTACTS OP Apply 0.4 drops to eye as needed.        Follow-up Information     Chrystal Lamarr RAMAN, MD. Schedule an appointment as soon as possible for a visit in 2 week(s).   Specialty: Family Medicine Why: for hospital follow-up Contact information: [address] [place] [address] [phone]         Sherrod Sherrod, MD Follow up on 03/18/2024.   Specialty: Oncology Why: AT 10:00 am, for hospital follow-up Contact information: [address] [place] [address] [phone]                Discharge Exam: Fredricka Weights   03/06/24 0612  Weight: 79.5 kg   S: Doing well, eating breakfast without any difficulty, diarrhea resolved.  No abdominal pain fever or chills.  Cleared to discharge home.  Daughter at the  bedside.  BP (!) 151/86 (BP Location: Left Arm)   Pulse 85   Temp 98 F (36.7 C) (Oral)   Resp 20   Ht 5' 1 (1.549 m)   Wt 79.5 kg   SpO2 94%   BMI 33.12 kg/m   Physical Exam General: Alert and oriented x 3, NAD Cardiovascular: S1 S2 clear, RRR.  Respiratory: CTAB, no wheezing Gastrointestinal: Soft, nontender, nondistended, NBS Ext: no pedal edema bilaterally Neuro: no new deficits Psych: Normal affect    Condition at discharge: fair  The results of significant diagnostics from this hospitalization (including imaging, microbiology, ancillary and laboratory) are listed below for reference.   Imaging Studies: CT CHEST W CONTRAST Result Date: 03/06/2024 CLINICAL DATA:  Pheochromocytoma or paraganglioma, unresectable, monitor Patient has CT chest scheduled outpatient on Monday, she requested it to be performed inpatient. * Tracking Code: BO * EXAM: CT CHEST WITH CONTRAST TECHNIQUE: Multidetector CT imaging of the chest was performed during intravenous contrast administration. RADIATION DOSE REDUCTION: This exam was performed according to the departmental dose-optimization program which includes automated exposure control, adjustment of the mA and/or kV according to patient size and/or use of iterative reconstruction technique. CONTRAST:  75mL OMNIPAQUE  IOHEXOL  300 MG/ML  SOLN COMPARISON:  Chest CT 09/09/2023 and 03/05/2023. Abdominal CT 03/05/2024 and abdominal MRI 10/21/2023. FINDINGS: Cardiovascular: No acute vascular findings are demonstrated. Mild atherosclerosis of the aorta, great vessels and coronary arteries again noted. No definite evidence of pulmonary embolism. The heart size is normal. There is no pericardial effusion. Mediastinum/Nodes: There are no enlarged mediastinal, hilar or axillary lymph nodes.Moderate-sized hiatal hernia appears unchanged. Lungs/Pleura: No pleural effusion or pneumothorax. There is chronic atelectasis medially in the left lower lobe adjacent to the  hiatal hernia and scattered pulmonary scarring bilaterally. Innumerable pulmonary nodules are very similar to the most recent prior study and consistent with widespread metastatic disease. Index lesion in the left upper lobe measures 1.2 cm on image 51/8 (previously 1.2 cm). There are left lower lobe lesions measuring 1.1 cm on image 57/8 and 1.1 cm on image 100/8 (previously 1.2 and 1.1 cm, respectively). Upper abdomen: The visualized upper abdomen appears stable, without acute findings. Low-density lesion posteriorly in the left hepatic lobe measuring 2.1 cm on image 124/2 is unchanged, corresponding with focal fat on MRI. No adrenal mass. Enhancing lesion in the interpolar region of the left kidney is grossly stable, measuring 2.2 cm on image 147/2. Musculoskeletal/Chest wall:  There is no chest wall mass or suspicious osseous finding. Superior endplate compression fracture at L1 as seen on recent abdominal CT. Multilevel thoracic spondylosis. IMPRESSION: 1. No acute findings or significant change from recent prior studies. 2. Innumerable pulmonary nodules consistent with widespread metastatic disease, similar to the most recent prior study. No progressive disease identified. 3. Grossly stable enhancing lesion in the interpolar region of the left kidney, remaining consistent with renal cell carcinoma. 4. Stable moderate-sized hiatal hernia. 5.  Aortic Atherosclerosis (ICD10-I70.0). Electronically Signed   By: Elsie Perone M.D.   On: 03/06/2024 15:27   CT HEAD WO CONTRAST ( ) Result Date: 03/06/2024 EXAM: CT HEAD WITHOUT CONTRAST 03/06/2024 09:27:55 AM TECHNIQUE: CT of the head was performed without the administration of intravenous contrast. Automated exposure control, iterative reconstruction, and/or weight based adjustment of the mA/kV was utilized to reduce the radiation dose to as low as reasonably achievable. COMPARISON: CT of the head dated 10/20/2022. CLINICAL HISTORY: Mental status change, unknown  cause. Abnormal Lab; Altered Mental Status; Diarrhea. FINDINGS: BRAIN AND VENTRICLES: No acute hemorrhage. No evidence of acute infarct. No hydrocephalus. No extra-axial collection. No mass effect or midline shift. There is age-related cerebral and cerebellar volume loss and mild-to-moderate periventricular white matter disease. ORBITS: No acute abnormality. The patient is status post bilateral lens replacement. SINUSES: No acute abnormality. SOFT TISSUES AND SKULL: No acute soft tissue abnormality. No skull fracture. IMPRESSION: 1. No acute intracranial abnormality. 2. Age-related cerebral and cerebellar volume loss with mild-to-moderate chronic microvascular ischemic changes. Electronically signed by: Evalene Coho MD 03/06/2024 09:59 AM EDT RP Workstation: [id number]   DG Chest 2 View Result Date: 03/05/2024 CLINICAL DATA:  Tachypnea and hypoxia. EXAM: CHEST - 2 VIEW COMPARISON:  Chest x-ray 11/08/2022.  CT of the chest 09/09/2023. FINDINGS: There are increased central interstitial markings bilaterally. Previously identified small nodular densities are not well defined on this x-ray. There is a large hiatal hernia, unchanged. There is no pleural effusion or pneumothorax. IMPRESSION: 1. Increased central interstitial markings bilaterally, which may represent pulmonary edema or atypical infection. 2. Large hiatal hernia. Electronically Signed   By: Greig Pique M.D.   On: 03/05/2024 17:53   CT ABDOMEN PELVIS W CONTRAST Result Date: 03/05/2024 CLINICAL DATA:  Diarrhea. Worsening altered mental status. Diarrhea for 2 days. Nausea starting today. EXAM: CT ABDOMEN AND PELVIS WITH CONTRAST TECHNIQUE: Multidetector CT imaging of the abdomen and pelvis was performed using the standard protocol following bolus administration of intravenous contrast. RADIATION DOSE REDUCTION: This exam was performed according to the departmental dose-optimization program which includes automated exposure control, adjustment of the  mA and/or kV according to patient size and/or use of iterative reconstruction technique. CONTRAST:  OMNIPAQUE  IOHEXOL  300 MG/ML  SOLN COMPARISON:  Chest radiograph 02/11/2024. MRI abdomen 10/21/2023. CT abdomen and pelvis 11/08/2022 FINDINGS: Lower chest: Numerous pulmonary nodules demonstrated in both lung bases. Nodules were present on the previous study but appear more numerous today. This is likely metastatic. Large esophageal hiatal hernia. Hepatobiliary: Mild diffuse fatty infiltration of the liver. Focal lesion centrally in the liver measuring 2.3 cm diameter. This has heterogeneous mostly hypodense appearance. No change since prior study. On prior MRI, this area demonstrates signal loss on out of phase T1 weighted images, likely indicating focal fatty infiltration. Gallbladder is surgically absent. No bile duct dilatation. Pancreas: Diffuse fatty atrophy of the pancreas. No acute abnormalities. Spleen: Normal in size without focal abnormality. Adrenals/Urinary Tract: No adrenal gland nodules. Renal parenchymal atrophy and scarring bilaterally. Left  intrarenal stone measuring 3 mm diameter. Focal hypoenhancing lesion again demonstrated in the left kidney measuring 2.4 cm diameter. This was felt consistent with renal cell carcinoma on prior MRI characterization. No hydronephrosis or hydroureter. Bladder is normal. Stomach/Bowel: Stomach, small bowel, and colon are not abnormally distended. No wall thickening or inflammatory stranding. Sigmoid colonic diverticula without evidence of acute diverticulitis. Small duodenal diverticulum. Appendix is normal. Vascular/Lymphatic: Aortic atherosclerosis. No enlarged abdominal or pelvic lymph nodes. Reproductive: Uterus and bilateral adnexa are unremarkable. Other: No abdominal wall hernia or abnormality. No abdominopelvic ascites. Musculoskeletal: Degenerative changes in the spine. Compression of the superior endplate of L1 is new since prior study (CT abdomen and  pelvis 11/08/2022 and lumbar spine radiographs 01/27/2024) and may represent acute compression. No destructive bone lesions are identified. Degenerative changes in the hips. IMPRESSION: 1. Numerous pulmonary nodules in the lung bases appear more numerous since on prior study. This is likely metastatic. 2. Large esophageal hiatal hernia. 3. Focal area of fatty infiltration demonstrated in the central liver. 4. Nonobstructing stone in the left kidney. 5. Hypoenhancing left renal mass measuring 2.4 cm diameter. This was present previously and felt to be consistent with small renal cell carcinoma on prior MRI. 6. No evidence of bowel obstruction or inflammation. 7. Aortic atherosclerosis. 8. Superior endplate compression at L1 is new since prior study and may represent acute fracture. Electronically Signed   By: Elsie Gravely M.D.   On: 03/05/2024 17:52   MM 3D SCREENING MAMMOGRAM BILATERAL BREAST Result Date: 02/27/2024 CLINICAL DATA:  Screening. EXAM: DIGITAL SCREENING BILATERAL MAMMOGRAM WITH TOMOSYNTHESIS AND CAD TECHNIQUE: Bilateral screening digital craniocaudal and mediolateral oblique mammograms were obtained. Bilateral screening digital breast tomosynthesis was performed. The images were evaluated with computer-aided detection. COMPARISON:  Previous exam(s). ACR Breast Density Category b: There are scattered areas of fibroglandular density. FINDINGS: There are no findings suspicious for malignancy. IMPRESSION: No mammographic evidence of malignancy. A result letter of this screening mammogram will be mailed directly to the patient. RECOMMENDATION: Screening mammogram in one year. (Code:[id number]-B-01Y) BI-RADS CATEGORY  1: Negative. Electronically Signed   By: Alm Parkins M.D.   On: 02/27/2024 13:41    Microbiology: Results for orders placed or performed during the hospital encounter of 03/05/24  C Difficile Quick Screen w PCR reflex     Status: None   Collection Time: 03/06/24  8:48 AM   Specimen:  STOOL  Result Value Ref Range Status   C Diff antigen NEGATIVE NEGATIVE Final   C Diff toxin NEGATIVE NEGATIVE Final   C Diff interpretation No C. difficile detected.  Final    Comment: Performed at [facility], [address], [place], [place] [address]    Labs: CBC: Recent Labs  Lab 03/05/24 1500 03/06/24 0455 03/07/24 0514  WBC 8.9 8.0 7.3  NEUTROABS  --   --  4.5  HGB 14.2 14.0 13.1  HCT 41.0 41.3 41.3  MCV 91.3 92.8 96.7  PLT 346 312 282   Basic Metabolic Panel: Recent Labs  Lab 03/05/24 1453 03/05/24 1500 03/06/24 0455 03/07/24 0514 03/07/24 0519  NA  --  137 138  --  135  K  --  3.0* 3.2*  --  3.8  CL  --  99 102  --  104  CO2  --  20* 22  --  19*  GLUCOSE  --  253* 161*  --  205*  BUN  --  20 16  --  11  CREATININE  --  1.17* 1.02*  --  0.82  CALCIUM   --  8.8* 8.7*  --  8.3*  MG  --  0.7* 1.5* 1.8  --   PHOS 2.5  --   --  2.0* 2.0*   Liver Function Tests: Recent Labs  Lab 03/05/24 1500 03/06/24 0455 03/07/24 0519  AST 22 24  --   ALT 18 20  --   ALKPHOS 109 106  --   BILITOT 0.6 0.9  --   PROT 7.0 6.7  --   ALBUMIN 4.1 4.0 3.6   CBG: Recent Labs  Lab 03/06/24 1203 03/06/24 1636 03/06/24 2151 03/07/24 0844 03/07/24 1140  GLUCAP 215* 165* 230* 237* 240*    Discharge time spent: greater than 30 minutes.  Signed: Nydia Distance, MD [facility] 03/07/2024

## 2024-03-07 NOTE — Progress Notes (Signed)
 HHPT setup w/ [facility].

## 2024-03-07 NOTE — Evaluation (Signed)
 Physical Therapy Evaluation-1x Patient Details Name: Michaela Santana MRN: [id number] DOB: [birth year] Today's Date: 03/07/2024  History of Present Illness  78 yo female admitted with hypomagnesemia, diarrhea, confusion. Hx of mild demetnia, neuroendocrine carcinoma, DM, chronic back pain, CKD, CHF, 2* AV block, anemia, depression/anxiety  Clinical Impression  On eval, pt was Mod Ind with mobility. She walked ~150 feet with her cane. HR 104 bpm, O2 95% on RA, dyspnea 2/4, with activity. Discussed d/c plan-pt will return home where she lives with her daughter. Discussed HHPT f/u-pt hesitant to agree, daughter in favor. Will recommend HHPT for general strength and balance training, if pt agreeable. No further acute care needs. 1x eval. Will sign off.         If plan is discharge home, recommend the following: Assist for transportation;Assistance with cooking/housework;Help with stairs or ramp for entrance   Can travel by private vehicle        Equipment Recommendations None recommended by PT  Recommendations for Other Services       Functional Status Assessment Patient has had a recent decline in their functional status and demonstrates the ability to make significant improvements in function in a reasonable and predictable amount of time.     Precautions / Restrictions Precautions Precautions: Fall Restrictions Weight Bearing Restrictions Per Provider Order: No      Mobility  Bed Mobility               General bed mobility comments: pt sitting EOB    Transfers Overall transfer level: Modified independent                      Ambulation/Gait Ambulation/Gait assistance: Modified independent (Device/Increase time) Gait Distance (Feet): 150 Feet Assistive device:  (hurry cane) Gait Pattern/deviations: Decreased stride length       General Gait Details: Tolerated distance well. HR 104 bpm, O2 95%, dyspnea 2/4  Stairs            Wheelchair Mobility      Tilt Bed    Modified Rankin (Stroke Patients Only)       Balance Overall balance assessment: Mild deficits observed, not formally tested                                           Pertinent Vitals/Pain Pain Assessment Pain Assessment: No/denies pain    Home Living Family/patient expects to be discharged to:: Private residence Living Arrangements: Children Available Help at Discharge: Family;Available 24 hours/day Type of Home: House Home Access: Level entry       Home Layout: One level Home Equipment: Rollator (4 wheels);Grab bars - toilet;Shower seat - built in;BSC/3in1 (hurry cane)      Prior Function Prior Level of Function : Independent/Modified Independent             Mobility Comments: uses cane ADLs Comments: mod ind     Extremity/Trunk Assessment   Upper Extremity Assessment Upper Extremity Assessment: Defer to OT evaluation    Lower Extremity Assessment Lower Extremity Assessment: Generalized weakness    Cervical / Trunk Assessment Cervical / Trunk Assessment: Normal  Communication   Communication Communication: No apparent difficulties    Cognition Arousal: Alert Behavior During Therapy: WFL for tasks assessed/performed   PT - Cognitive impairments: History of cognitive impairments, Memory  PT - Cognition Comments: mild dementia Following commands: Intact       Cueing Cueing Techniques: Verbal cues     General Comments      Exercises     Assessment/Plan    PT Assessment All further PT needs can be met in the next venue of care (HHPT)  PT Problem List Decreased balance;Decreased activity tolerance       PT Treatment Interventions      PT Goals (Current goals can be found in the Care Plan section)  Acute Rehab PT Goals Patient Stated Goal: home! PT Goal Formulation: All assessment and education complete, DC therapy    Frequency       Co-evaluation                AM-PAC PT 6 Clicks Mobility  Outcome Measure Help needed turning from your back to your side while in a flat bed without using bedrails?: None Help needed moving from lying on your back to sitting on the side of a flat bed without using bedrails?: None Help needed moving to and from a bed to a chair (including a wheelchair)?: None Help needed standing up from a chair using your arms (e.g., wheelchair or bedside chair)?: None Help needed to walk in hospital room?: A Little Help needed climbing 3-5 steps with a railing? : A Little 6 Click Score: 22    End of Session Equipment Utilized During Treatment: Gait belt Activity Tolerance: Patient tolerated treatment well Patient left: in bed;with call bell/phone within reach;with family/visitor present (sitting EOB)        Time: 8955-8942 PT Time Calculation (min) (ACUTE ONLY): 13 min   Charges:   PT Evaluation $PT Eval Low Complexity: 1 Low   PT General Charges $$ ACUTE PT VISIT: 1 Visit           Dannial SQUIBB, PT Acute Rehabilitation  Office: [phone]

## 2024-03-09 ENCOUNTER — Inpatient Hospital Stay

## 2024-03-09 ENCOUNTER — Ambulatory Visit (HOSPITAL_COMMUNITY)

## 2024-03-12 ENCOUNTER — Ambulatory Visit: Attending: Cardiovascular Disease | Admitting: Cardiovascular Disease

## 2024-03-12 ENCOUNTER — Encounter: Payer: Self-pay | Admitting: Cardiovascular Disease

## 2024-03-12 VITALS — BP 134/86 | HR 71 | Ht 61.0 in | Wt 177.7 lb

## 2024-03-12 DIAGNOSIS — I491 Atrial premature depolarization: Secondary | ICD-10-CM | POA: Insufficient documentation

## 2024-03-12 DIAGNOSIS — I441 Atrioventricular block, second degree: Secondary | ICD-10-CM | POA: Diagnosis not present

## 2024-03-12 DIAGNOSIS — I493 Ventricular premature depolarization: Secondary | ICD-10-CM | POA: Insufficient documentation

## 2024-03-12 DIAGNOSIS — I440 Atrioventricular block, first degree: Secondary | ICD-10-CM | POA: Diagnosis not present

## 2024-03-12 NOTE — Progress Notes (Signed)
 Cardiology Office Note:  .   Date:  03/12/2024  ID:  Michaela Santana, DOB 1945-12-30, MRN [id number] PCP: Chrystal Lamarr RAMAN, MD  [facility] Providers Cardiologist:  None    History of Present Illness: .    Chief Complaint  Patient presents with   Follow-up    Michaela Santana is a 78 y.o. female with history of 1AVB, DM, HTN, HLD, PACs who presents for follow-up.    History of Present Illness   Michaela Santana is a 78 year old female with first degree AV block who presents for follow-up.  She reports no dizziness, lightheadedness, palpitations, or rapid heartbeat. A recent EKG on March 05, 2024, showed sinus rhythm with a heart rate of 74 and the presence of PACs.  She has a history of pulmonary embolism diagnosed in March 2025, initially treated with a starter pack of Eliquis , which was discontinued. A recent CT scan showed no new blood clots. She is scheduled to follow up with her oncologist on March 18, 2024, for further management.  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 She was recently hospitalized for low magnesium , low potassium, and norovirus. Her activity level is mostly sedentary, with occasional household chores. Independent physical therapy was recently completed.  Diabetes management is ongoing, with a recent HbA1c of 8.1%. She is on amlodipine  5 mg daily for hypertension. She is not on cholesterol medications due to previous refusal, and her medication regimen is minimized to essential drugs only.  There are concerns about memory impairment, with occasional forgetfulness noted by her family, although she states, 'I remember what I want to.'          Problem List 1. DM -A1c 8.1 2. HTN 3. HLD -T chol 131, HDL 50, LDL 58, TG 137 4. 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 PVCs  -6.2% burden 8. Stage IV Carcinoid tumor  9. Dementia  10. GI  bleed  -duodenal ulcer  11. PE -Dx 08/2023 12. RCC    ROS: All other ROS reviewed and negative. Pertinent positives noted in the HPI.     Studies Reviewed: .        NM Stress 07/18/2020 Lexiscan  stress is electrically negative for ischemia Myovue scan shows normal perfusion No ischemia LVEF is 70% Low risk study Physical Exam:   VS:  BP 134/86 (BP Location: Left Arm, Patient Position: Sitting, Cuff Size: Normal)   Pulse 71   Ht 5' 1 (1.549 m)   Wt 177 lb 11.2 oz (80.6 kg)   SpO2 94%   BMI 33.58 kg/m    Wt Readings from Last 3 Encounters:  03/12/24 177 lb 11.2 oz (80.6 kg)  03/06/24 175 lb 4.3 oz (79.5 kg)  01/27/24 182 lb 15.7 oz (83 kg)    GEN: Well nourished, well developed in no acute distress NECK: No JVD; No carotid bruits CARDIAC: RRR, no murmurs, rubs, gallops RESPIRATORY:  Clear to auscultation without rales, wheezing or rhonchi  ABDOMEN: Soft, non-tender, non-distended EXTREMITIES:  No edema; No deformity  ASSESSMENT AND PLAN: .   Assessment and Plan    First degree atrioventricular block Well-managed with no high-grade conduction disease. Recent EKG shows normal sinus rhythm. - Continue conservative management.  Premature atrial and ventricular contractions PACs and PVCs present with 4% and 6% burden respectively. Recent EKG shows normal sinus rhythm. - Continue conservative management.  Pulmonary embolism, subsegmental, March 2025 Recent CT scan may indicate artifact rather than true embolism. GI bleed may influence anticoagulation decisions. Cancer increases clot risk; long-term anticoagulation considered if embolism confirmed. - Follow up with oncology regarding management of pulmonary embolism. - Discuss anticoagulation therapy with Doctor Deatrice.  Type 2 diabetes mellitus Recent A1c was 8.1%. - Encourage working on diabetes management.  Hypertension Blood pressure well-controlled with Amlodipine  5 mg daily. - Continue current antihypertensive  regimen.  Hyperlipidemia Not on statin therapy due to previous refusal, acceptable given current situation.  Possible dementia/memory impairment Concerns for memory impairment noted but not formally diagnosed.              Follow-up: Return in about 1 year (around 03/12/2025).   Signed, Darryle DASEN. Barbaraann, MD, [facility]  [facility]  [address] [place], [place] [address] [phone]  3:22 PM

## 2024-03-12 NOTE — Patient Instructions (Signed)
 Medication Instructions:  No changes  *If you need a refill on your cardiac medications before your next appointment, please call your pharmacy*   Lab Work: Not needed If you have labs (blood work) drawn today and your tests are completely normal, you will receive your results only by: MyChart Message (if you have MyChart) OR A paper copy in the mail If you have any lab test that is abnormal or we need to change your treatment, we will call you to review the results.   Testing/Procedures:  Not needed  Follow-Up: At [facility], you and your health needs are our priority.  As part of our continuing mission to provide you with exceptional heart care, we have created designated Provider Care Teams.  These Care Teams include your primary Cardiologist (physician) and Advanced Practice Providers (APPs -  Physician Assistants and Nurse Practitioners) who all work together to provide you with the care you need, when you need it.     Your next appointment:   12 month(s)  The format for your next appointment:   In Person  Provider:   Aline Door, PA-C, Rollo Louder, PA-C, or Damien Braver, NP        Other Instructions

## 2024-03-16 DIAGNOSIS — Z23 Encounter for immunization: Secondary | ICD-10-CM | POA: Diagnosis not present

## 2024-03-16 DIAGNOSIS — F0390 Unspecified dementia without behavioral disturbance: Secondary | ICD-10-CM | POA: Diagnosis not present

## 2024-03-16 DIAGNOSIS — I10 Essential (primary) hypertension: Secondary | ICD-10-CM | POA: Diagnosis not present

## 2024-03-16 DIAGNOSIS — R1110 Vomiting, unspecified: Secondary | ICD-10-CM | POA: Diagnosis not present

## 2024-03-16 DIAGNOSIS — E876 Hypokalemia: Secondary | ICD-10-CM | POA: Diagnosis not present

## 2024-03-18 ENCOUNTER — Other Ambulatory Visit: Payer: Self-pay | Admitting: Medical Oncology

## 2024-03-18 ENCOUNTER — Inpatient Hospital Stay

## 2024-03-18 ENCOUNTER — Inpatient Hospital Stay: Attending: Internal Medicine | Admitting: Internal Medicine

## 2024-03-18 ENCOUNTER — Other Ambulatory Visit: Payer: Self-pay | Admitting: Pharmacist

## 2024-03-18 VITALS — BP 122/66 | HR 77 | Temp 96.7°F | Resp 17 | Ht 61.0 in | Wt 176.0 lb

## 2024-03-18 VITALS — BP 156/81 | HR 59 | Resp 16

## 2024-03-18 DIAGNOSIS — Z7984 Long term (current) use of oral hypoglycemic drugs: Secondary | ICD-10-CM | POA: Insufficient documentation

## 2024-03-18 DIAGNOSIS — N183 Chronic kidney disease, stage 3 unspecified: Secondary | ICD-10-CM | POA: Insufficient documentation

## 2024-03-18 DIAGNOSIS — Z7901 Long term (current) use of anticoagulants: Secondary | ICD-10-CM | POA: Insufficient documentation

## 2024-03-18 DIAGNOSIS — Z86711 Personal history of pulmonary embolism: Secondary | ICD-10-CM | POA: Insufficient documentation

## 2024-03-18 DIAGNOSIS — C3492 Malignant neoplasm of unspecified part of left bronchus or lung: Secondary | ICD-10-CM

## 2024-03-18 DIAGNOSIS — E1122 Type 2 diabetes mellitus with diabetic chronic kidney disease: Secondary | ICD-10-CM | POA: Diagnosis not present

## 2024-03-18 DIAGNOSIS — R197 Diarrhea, unspecified: Secondary | ICD-10-CM | POA: Insufficient documentation

## 2024-03-18 DIAGNOSIS — C7A090 Malignant carcinoid tumor of the bronchus and lung: Secondary | ICD-10-CM

## 2024-03-18 DIAGNOSIS — Z79899 Other long term (current) drug therapy: Secondary | ICD-10-CM | POA: Diagnosis not present

## 2024-03-18 DIAGNOSIS — I129 Hypertensive chronic kidney disease with stage 1 through stage 4 chronic kidney disease, or unspecified chronic kidney disease: Secondary | ICD-10-CM | POA: Insufficient documentation

## 2024-03-18 DIAGNOSIS — R110 Nausea: Secondary | ICD-10-CM | POA: Insufficient documentation

## 2024-03-18 LAB — CMP (CANCER CENTER ONLY)
ALT: 23 U/L (ref 0–44)
AST: 23 U/L (ref 15–41)
Albumin: 4.3 g/dL (ref 3.5–5.0)
Alkaline Phosphatase: 98 U/L (ref 38–126)
Anion gap: 12 (ref 5–15)
BUN: 18 mg/dL (ref 8–23)
CO2: 24 mmol/L (ref 22–32)
Calcium: 7.8 mg/dL — ABNORMAL LOW (ref 8.9–10.3)
Chloride: 100 mmol/L (ref 98–111)
Creatinine: 0.99 mg/dL (ref 0.44–1.00)
GFR, Estimated: 59 mL/min — ABNORMAL LOW (ref >60)
Glucose, Bld: 224 mg/dL — ABNORMAL HIGH (ref 70–99)
Potassium: 3.4 mmol/L — ABNORMAL LOW (ref 3.5–5.1)
Sodium: 136 mmol/L (ref 135–145)
Total Bilirubin: 0.7 mg/dL (ref 0.0–1.2)
Total Protein: 7.7 g/dL (ref 6.5–8.1)

## 2024-03-18 LAB — CBC WITH DIFFERENTIAL (CANCER CENTER ONLY)
Abs Immature Granulocytes: 0.05 K/uL (ref 0.00–0.07)
Basophils Absolute: 0.1 K/uL (ref 0.0–0.1)
Basophils Relative: 1 %
Eosinophils Absolute: 0.3 K/uL (ref 0.0–0.5)
Eosinophils Relative: 3 %
HCT: 41 % (ref 36.0–46.0)
Hemoglobin: 14.4 g/dL (ref 12.0–15.0)
Immature Granulocytes: 1 %
Lymphocytes Relative: 17 %
Lymphs Abs: 1.6 K/uL (ref 0.7–4.0)
MCH: 31.5 pg (ref 26.0–34.0)
MCHC: 35.1 g/dL (ref 30.0–36.0)
MCV: 89.7 fL (ref 80.0–100.0)
Monocytes Absolute: 0.6 K/uL (ref 0.1–1.0)
Monocytes Relative: 6 %
Neutro Abs: 7 K/uL (ref 1.7–7.7)
Neutrophils Relative %: 72 %
Platelet Count: 302 K/uL (ref 150–400)
RBC: 4.57 MIL/uL (ref 3.87–5.11)
RDW: 13 % (ref 11.5–15.5)
WBC Count: 9.7 K/uL (ref 4.0–10.5)
nRBC: 0 % (ref 0.0–0.2)

## 2024-03-18 LAB — PHOSPHORUS: Phosphorus: 3.4 mg/dL (ref 2.5–4.6)

## 2024-03-18 LAB — MAGNESIUM: Magnesium: 0.6 mg/dL — CL (ref 1.7–2.4)

## 2024-03-18 MED ORDER — SODIUM CHLORIDE 0.9 % IV SOLN: Freq: Once | INTRAVENOUS | Status: AC

## 2024-03-18 MED ORDER — SODIUM CHLORIDE 0.9 % IV SOLN: 4.0000 g | Freq: Once | INTRAVENOUS | Status: DC

## 2024-03-18 MED ORDER — MAGNESIUM SULFATE 4 GM/100ML IV SOLN
4.0000 g | Freq: Once | INTRAVENOUS | Status: AC
  Administered 2024-03-18: 4 g via INTRAVENOUS
  Filled 2024-03-18: qty 100

## 2024-03-18 MED ORDER — OCTREOTIDE ACETATE 20 MG IM KIT
20.0000 mg | PACK | Freq: Once | INTRAMUSCULAR | Status: DC
  Filled 2024-03-18: qty 1

## 2024-03-18 MED ORDER — MAGNESIUM SULFATE 4 GM/100ML IV SOLN
4.0000 g | Freq: Once | INTRAVENOUS | Status: DC
  Filled 2024-03-18: qty 100

## 2024-03-18 NOTE — Progress Notes (Signed)
 Requested recent labs from PCP  showing mag 0.6.

## 2024-03-18 NOTE — Progress Notes (Signed)
 Patient Mg is 0.6 today- MD aware of situation. IVF and 4gm of magnesium  running.

## 2024-03-18 NOTE — Progress Notes (Signed)
 Auth still pending for octreotide. Darlena recommended to reschedule injection appt to allow time for auth process. Order discontinued for today.  Harlene Nasuti, PharmD Oncology Infusion Pharmacist 03/18/2024 12:31 PM

## 2024-03-18 NOTE — Progress Notes (Signed)
 CRITICAL VALUE STICKER  CRITICAL VALUE: mag 0.6  RECEIVER (on-site recipient of call): Danetta POUR, RN  DATE & TIME NOTIFIED: 03/18/24 1200pm  MESSENGER (representative from lab): Aldona  MD NOTIFIED: Dr Sherrod  TIME OF NOTIFICATION: 03/18/24 12pm  RESPONSE:  pt in infusion already receiving 4gm IV mag

## 2024-03-18 NOTE — Patient Instructions (Signed)
Rehydration, Older Adult  Rehydration is the replacement of fluids, salts, and minerals in the body (electrolytes) that are lost during dehydration. Dehydration is when there is not enough water or other fluids in the body. This happens when you lose more fluids than you take in. People who are age 77 or older have a higher risk of dehydration than younger adults. This is because in older age, the body: Is less able to maintain the right amount of water. Does not respond to temperature changes as well. Does not get a sense of thirst as easily or quickly. Other causes include: Not drinking enough fluids. This can occur when you are ill, when you forget to drink, or when you are doing activities that require a lot of energy, especially in hot weather. Conditions that cause loss of water or other fluids. These include diarrhea, vomiting, sweating, or urinating a lot. Other illnesses, such as fever or infection. Certain medicines, such as those that remove excess fluid from the body (diuretics). Symptoms of mild or moderate dehydration may include thirst, dry lips and mouth, and dizziness. Symptoms of severe dehydration may include increased heart rate, confusion, fainting, and not urinating. In severe cases, you may need to get fluids through an IV at the hospital. For mild or moderate cases, you can usually rehydrate at home by drinking certain fluids as told by your health care provider. What are the risks? Rehydration is usually safe. Taking in too much fluid (overhydration) can be a problem but is rare. Overhydration can cause an imbalance of electrolytes in the body, kidney failure, fluid in the lungs, or a decrease in salt (sodium) levels in the body. Supplies needed: You will need an oral rehydration solution (ORS) if your health care provider tells you to use one. This is a drink to treat dehydration. It can be found in pharmacies and retail stores. How to rehydrate Fluids Follow  instructions from your health care provider about what to drink. The kind of fluid and the amount you should drink depend on your condition. In general, you should choose drinks that you prefer. If told by your health care provider, drink an ORS. Make an ORS by following instructions on the package. Start by drinking small amounts, about  cup (120 mL) every 5-10 minutes. Slowly increase how much you drink until you have taken in the amount recommended by your health care provider. Drink enough clear fluids to keep your urine pale yellow. If you were told to drink an ORS, finish it first, then start slowly drinking other clear fluids. Drink fluids such as: Water. This includes sparkling and flavored water. Drinking only water can lead to having too little sodium in your body (hyponatremia). Follow the advice of your health care provider. Water from ice chips you suck on. Fruit juice with water added to it(diluted). Sports drinks. Hot or cold herbal teas. Broth-based soups. Coffee. Milk or milk products. Food Follow instructions from your health care provider about what to eat while you rehydrate. Your health care provider may recommend that you slowly begin eating regular foods in small amounts. Eat foods that contain a healthy balance of electrolytes, such as bananas, oranges, potatoes, tomatoes, and spinach. Avoid foods that are greasy or contain a lot of sugar. In some cases, you may get nutrition through a feeding tube that is passed through your nose and into your stomach (nasogastric tube, or NG tube). This may be done if you have uncontrolled vomiting or diarrhea. Drinks to avoid  Certain drinks may make dehydration worse. While you rehydrate, avoid drinking alcohol. How to tell if you are recovering from dehydration You may be getting better if: You are urinating more often than before you started rehydrating. Your urine is pale yellow. Your energy level improves. You vomit less  often. You have diarrhea less often. Your appetite improves or returns to normal. You feel less dizzy or light-headed. Your skin tone and color start to look more normal. Follow these instructions at home: Take over-the-counter and prescription medicines only as told by your health care provider. Do not take sodium tablets. Doing this can lead to having too much sodium in your body (hypernatremia). Contact a health care provider if: You continue to have symptoms of mild or moderate dehydration, such as: Thirst. Dry lips. Slightly dry mouth. Dizziness. Dark urine or less urine than usual. Muscle cramps. You continue to vomit or have diarrhea. Get help right away if: You have symptoms of dehydration that get worse. You have a fever. You have a severe headache. You have been vomiting and have problems, such as: Your vomiting gets worse. Your vomit includes blood or green matter (bile). You cannot eat or drink without vomiting. You have problems with urination or bowel movements, such as: Diarrhea that gets worse. Blood in your stool (feces). This may cause stool to look black and tarry. Not urinating, or urinating only a small amount of very dark urine, within 6-8 hours. You have trouble breathing. You have symptoms that get worse with treatment. These symptoms may be an emergency. Get help right away. Call 911. Do not wait to see if the symptoms will go away. Do not drive yourself to the hospital. This information is not intended to replace advice given to you by your health care provider. Make sure you discuss any questions you have with your health care provider. Document Revised: 10/11/2021 Document Reviewed: 10/09/2021 Elsevier Patient Education  2024 Else[employer] Inc.  Magnesium Sulfate Injection What is this medication? MAGNESIUM SULFATE (mag NEE zee um SUL fate) prevents and treats low levels of magnesium in your body. It may also be used to prevent and treat seizures during  pregnancy in people with high blood pressure disorders, such as preeclampsia or eclampsia. Magnesium plays an important role in maintaining the health of your muscles and nervous system. This medicine may be used for other purposes; ask your health care provider or pharmacist if you have questions. What should I tell my care team before I take this medication? They need to know if you have any of these conditions: Heart disease History of irregular heart beat Kidney disease An unusual or allergic reaction to magnesium sulfate, medications, foods, dyes, or preservatives Pregnant or trying to get pregnant Breast-feeding How should I use this medication? This medication is for infusion into a vein. It is given in a hospital or clinic setting. Talk to your care team about the use of this medication in children. While this medication may be prescribed for selected conditions, precautions do apply. Overdosage: If you think you have taken too much of this medicine contact a poison control center or emergency room at once. NOTE: This medicine is only for you. Do not share this medicine with others. What if I miss a dose? This does not apply. What may interact with this medication? Certain medications for anxiety or sleep Certain medications for seizures, such phenobarbital Digoxin Medications that relax muscles for surgery Narcotic medications for pain This list may not describe all possible  interactions. Give your health care provider a list of all the medicines, herbs, non-prescription drugs, or dietary supplements you use. Also tell them if you smoke, drink alcohol, or use illegal drugs. Some items may interact with your medicine. What should I watch for while using this medication? Your condition will be monitored carefully while you are receiving this medication. You may need blood work done while you are receiving this medication. What side effects may I notice from receiving this  medication? Side effects that you should report to your care team as soon as possible: Allergic reactions--skin rash, itching, hives, swelling of the face, lips, tongue, or throat High magnesium level--confusion, drowsiness, facial flushing, redness, sweating, muscle weakness, fast or irregular heartbeat, trouble breathing Low blood pressure--dizziness, feeling faint or lightheaded, blurry vision Side effects that usually do not require medical attention (report to your care team if they continue or are bothersome): Headache Nausea This list may not describe all possible side effects. Call your doctor for medical advice about side effects. You may report side effects to FDA at 1-800-FDA-1088. Where should I keep my medication? This medication is given in a hospital or clinic and will not be stored at home. NOTE: This sheet is a summary. It may not cover all possible information. If you have questions about this medicine, talk to your doctor, pharmacist, or health care provider.  2024 Elsevier/Gold Standard (2021-02-08 00:00:00)

## 2024-03-18 NOTE — Progress Notes (Signed)
 [facility] [phone]   Fax:([phone]) [phone]  OFFICE PROGRESS NOTE  Michaela Lamarr RAMAN, MD [address] [place] [address]  DIAGNOSIS:  1) Stage IV (T1b, N0, M1a) low-grade neuroendocrine carcinoma (carcinoid tumor), presented with innumerable bilateral pulmonary nodules diagnosed in March 2023. 2) incidental small subacute pulmonary embolus within a segmental pulmonary arterial branch of the right upper lobe on CT scan of the chest on September 09, 2023.  PRIOR THERAPY: None  CURRENT THERAPY:  1) octreotide 20 mg intramuscular every 4 weeks.  First dose 03/18/2024. 2) Eliquis  5 mg p.o. twice daily  INTERVAL HISTORY: Michaela Santana 78 y.o. female returns to the clinic today for follow-up visit accompanied by her daughter.Discussed the use of AI scribe software for clinical note transcription with the patient, who gave verbal consent to proceed.  History of Present Illness Michaela Santana is a 78 year old female with stage four low grade neuroendocrine carcinoma who presents with persistent diarrhea and electrolyte imbalances. She is accompanied by her daughter, Michaela Santana.  She has a history of stage four low grade neuroendocrine carcinoma, diagnosed in March 2023, with innumerable bilateral pulmonary nodules. She is currently in an observation. A recent CT scan of the abdomen, pelvis, and chest was performed during her hospitalization.  Recently, she was hospitalized due to a norovirus infection, which caused severe diarrhea and electrolyte imbalances, including hypokalemia, hypophosphatemia, and hypomagnesemia. During her hospitalization, she received aggressive intravenous replacement therapy for magnesium , potassium, and phosphorus. Since discharge, her magnesium  levels have fluctuated, with a recent drop to 0.6 mmol/L. She continues to experience diarrhea, occurring at least twice daily, and occasional vomiting. She reports a lack of appetite and  recent weight fluctuations, losing three pounds but regaining them shortly after. She also experiences hot flashes.  Her current medications include magnesium  glycinate gummies, 600 mg four times daily. Despite this, her magnesium  levels remain unstable. No current shortness of breath. She has a history of stomach issues and dietary sensitivities, which may exacerbate her symptoms.     MEDICAL HISTORY: Past Medical History:  Diagnosis Date   Allergic rhinitis    Anemia    Arthritis    probably in hands (07/17/2016)   CKD (chronic kidney disease), stage III (HCC)    Depression    Esophageal reflux    Family history of adverse reaction to anesthesia    her father had some issues when he had his gallbladder taken out when he was in his late 70s   GERD (gastroesophageal reflux disease)    Glaucoma    open angle, bilateral   High cholesterol    History of kidney stones    Hypercholesterolemia    Hypertension    Kidney cysts    Lumbago with sciatica, right side    Pulmonary embolism (HCC)    Sepsis (HCC) 07/2016   d/t flu   Type II diabetes mellitus (HCC)    Vitamin D deficiency     ALLERGIES:  is allergic to codeine and alprazolam .  MEDICATIONS:  Current Outpatient Medications  Medication Sig Dispense Refill   acetaminophen  (TYLENOL ) 650 MG CR tablet Take 650 mg by mouth every 8 (eight) hours as needed for pain. (Patient not taking: Reported on 03/12/2024)     albuterol  (VENTOLIN  HFA) 108 (90 Base) MCG/ACT inhaler Inhale into the lungs every 6 (six) hours as needed for wheezing or shortness of breath. (Patient not taking: Reported on 03/12/2024)     amLODipine  (  NORVASC ) 5 MG tablet Take 5 mg by mouth daily.     apixaban  (ELIQUIS ) 5 MG TABS tablet Take 1 tablet (5 mg total) by mouth 2 (two) times daily. (Patient not taking: Reported on 03/12/2024) 60 tablet 3   Artificial Tear Solution (SYSTANE CONTACTS OP) Apply 0.4 drops to eye as needed.     Blood Glucose Monitoring Suppl  (FREESTYLE LITE) DEVI USE UTD (Patient taking differently: as needed.)  0   calcium  carbonate (TUMS - DOSED IN MG ELEMENTAL CALCIUM ) 500 MG chewable tablet Chew 1 tablet by mouth daily. (Patient not taking: Reported on 03/12/2024)     DULoxetine  HCl 40 MG CPEP Take 1 capsule by mouth daily.     ferrous sulfate 325 (65 FE) MG EC tablet Take 325 mg by mouth daily. (Patient taking differently: Take 325 mg by mouth 2 (two) times a week.)     FREESTYLE LITE test strip USE TO CHECK FASTING GLUCOSE IN THE MORNING AND 2 HOURS AFTER DINNER OR LUNCH (Patient not taking: Reported on 03/12/2024)  3   furosemide  (LASIX ) 20 MG tablet Take 1 tablet (20 mg total) by mouth as needed for fluid or edema (swelling). Pt needs to keep upcoming appt in Oct for further refills (Patient taking differently: Take 20 mg by mouth daily. Pt needs to keep upcoming appt in Oct for further refills) 90 tablet 0   gabapentin  (NEURONTIN ) 300 MG capsule Take 300 mg by mouth every evening.     glipiZIDE  (GLUCOTROL  XL) 5 MG 24 hr tablet Take 5 mg by mouth daily with breakfast.     Lancets (FREESTYLE) lancets  (Patient not taking: Reported on 03/12/2024)     latanoprost  (XALATAN ) 0.005 % ophthalmic solution Place 1 drop into the right eye at bedtime.     loperamide  (IMODIUM ) 2 MG capsule Take 1 capsule (2 mg total) by mouth every 8 (eight) hours as needed for diarrhea or loose stools. (Patient not taking: Reported on 03/12/2024)     loratadine (CLARITIN) 10 MG tablet Take 10 mg by mouth daily as needed for allergies.     LORazepam  (ATIVAN ) 0.5 MG tablet Take 0.5 mg by mouth every 8 (eight) hours. (Patient taking differently: Take 0.5 mg by mouth at bedtime.)     losartan  (COZAAR ) 100 MG tablet Take 0.5 tablets (50 mg total) by mouth daily. (Patient not taking: Reported on 03/12/2024) 30 tablet 1   magnesium  oxide (MAG-OX) 400 (240 Mg) MG tablet Take 2 tablets (800 mg total) by mouth 2 (two) times daily. 60 tablet 0   ondansetron  (ZOFRAN ) 8 MG  tablet Take 1 tablet (8 mg total) by mouth every 8 (eight) hours as needed. (Patient not taking: Reported on 03/12/2024) 30 tablet 1   oxymetazoline (AFRIN) 0.05 % nasal spray Place 1 spray into both nostrils 2 (two) times daily as needed for congestion.     pantoprazole  (PROTONIX ) 40 MG tablet Take 1 tablet (40 mg total) by mouth 2 (two) times daily before a meal. (Patient taking differently: Take 40 mg by mouth daily.) 120 tablet 3   potassium chloride  (KLOR-CON ) 20 MEQ packet Take 20 mEq by mouth daily.     QUEtiapine  (SEROQUEL ) 25 MG tablet Take 12.5 mg by mouth every other day.     vitamin B-12 (CYANOCOBALAMIN ) 1000 MCG tablet Take 1 tablet (1,000 mcg total) by mouth daily. 30 tablet 0   No current facility-administered medications for this visit.    SURGICAL HISTORY:  Past Surgical History:  Procedure Laterality  Date   BREAST BIOPSY  1990   ? side; it wasn't anything   BRONCHIAL BIOPSY  08/01/2021   Procedure: BRONCHIAL BIOPSIES;  Surgeon: Brenna Adine CROME, DO;  Location: [facility] ENDOSCOPY;  Service: Pulmonary;;   BRONCHIAL NEEDLE ASPIRATION BIOPSY  08/01/2021   Procedure: BRONCHIAL NEEDLE ASPIRATION BIOPSIES;  Surgeon: Brenna Adine CROME, DO;  Location: [facility] ENDOSCOPY;  Service: Pulmonary;;   CATARACT EXTRACTION, BILATERAL  2021   CHOLECYSTECTOMY OPEN  1971   COLONOSCOPY     2001,2011,2023   ESOPHAGOGASTRODUODENOSCOPY (EGD) WITH PROPOFOL  N/A 11/10/2022   Procedure: ESOPHAGOGASTRODUODENOSCOPY (EGD) WITH PROPOFOL ;  Surgeon: Burnette Fallow, MD;  Location: [facility] ENDOSCOPY;  Service: Gastroenterology;  Laterality: N/A;   TUBAL LIGATION  1978   VIDEO BRONCHOSCOPY WITH RADIAL ENDOBRONCHIAL ULTRASOUND  08/01/2021   Procedure: RADIAL ENDOBRONCHIAL ULTRASOUND;  Surgeon: Brenna Adine CROME, DO;  Location: [facility] ENDOSCOPY;  Service: Pulmonary;;    REVIEW OF SYSTEMS:  Constitutional: positive for fatigue and weight loss Eyes: negative Ears, nose, mouth, throat, and face: negative Respiratory: positive for  wheezing Cardiovascular: negative Gastrointestinal: positive for diarrhea Genitourinary:negative Integument/breast: negative Hematologic/lymphatic: negative Musculoskeletal:negative Neurological: negative Behavioral/Psych: negative Endocrine: negative Allergic/Immunologic: negative   PHYSICAL EXAMINATION: General appearance: alert, cooperative, fatigued, and no distress Head: Normocephalic, without obvious abnormality, atraumatic Neck: no adenopathy, no JVD, supple, symmetrical, trachea midline, and thyroid  not enlarged, symmetric, no tenderness/mass/nodules Lymph nodes: Cervical, supraclavicular, and axillary nodes normal. Resp: clear to auscultation bilaterally Back: symmetric, no curvature. ROM normal. No CVA tenderness. Cardio: regular rate and rhythm, S1, S2 normal, no murmur, click, rub or gallop GI: soft, non-tender; bowel sounds normal; no masses,  no organomegaly Extremities: extremities normal, atraumatic, no cyanosis or edema Neurologic: Alert and oriented X 3, normal strength and tone. Normal symmetric reflexes. Normal coordination and gait  ECOG PERFORMANCE STATUS: 1 - Symptomatic but completely ambulatory  Blood pressure 122/66, pulse 77, temperature (!) 96.7 F (35.9 C), resp. rate 17, height 5' 1 (1.549 m), weight 176 lb (79.8 kg), SpO2 97%.  LABORATORY DATA: Lab Results  Component Value Date   WBC 7.3 03/07/2024   HGB 13.1 03/07/2024   HCT 41.3 03/07/2024   MCV 96.7 03/07/2024   PLT 282 03/07/2024      Chemistry      Component Value Date/Time   NA 135 03/07/2024 0519   K 3.8 03/07/2024 0519   CL 104 03/07/2024 0519   CO2 19 (L) 03/07/2024 0519   BUN 11 03/07/2024 0519   CREATININE 0.82 03/07/2024 0519   CREATININE 1.13 (H) 09/09/2023 1001      Component Value Date/Time   CALCIUM  8.3 (L) 03/07/2024 0519   ALKPHOS 106 03/06/2024 0455   AST 24 03/06/2024 0455   AST 20 09/09/2023 1001   ALT 20 03/06/2024 0455   ALT 21 09/09/2023 1001   BILITOT 0.9  03/06/2024 0455   BILITOT 0.7 09/09/2023 1001       RADIOGRAPHIC STUDIES: CT CHEST W CONTRAST Result Date: 03/06/2024 CLINICAL DATA:  Pheochromocytoma or paraganglioma, unresectable, monitor Patient has CT chest scheduled outpatient on Monday, she requested it to be performed inpatient. * Tracking Code: BO * EXAM: CT CHEST WITH CONTRAST TECHNIQUE: Multidetector CT imaging of the chest was performed during intravenous contrast administration. RADIATION DOSE REDUCTION: This exam was performed according to the departmental dose-optimization program which includes automated exposure control, adjustment of the mA and/or kV according to patient size and/or use of iterative reconstruction technique. CONTRAST:  75mL OMNIPAQUE  IOHEXOL  300 MG/ML  SOLN COMPARISON:  Chest  CT 09/09/2023 and 03/05/2023. Abdominal CT 03/05/2024 and abdominal MRI 10/21/2023. FINDINGS: Cardiovascular: No acute vascular findings are demonstrated. Mild atherosclerosis of the aorta, great vessels and coronary arteries again noted. No definite evidence of pulmonary embolism. The heart size is normal. There is no pericardial effusion. Mediastinum/Nodes: There are no enlarged mediastinal, hilar or axillary lymph nodes.Moderate-sized hiatal hernia appears unchanged. Lungs/Pleura: No pleural effusion or pneumothorax. There is chronic atelectasis medially in the left lower lobe adjacent to the hiatal hernia and scattered pulmonary scarring bilaterally. Innumerable pulmonary nodules are very similar to the most recent prior study and consistent with widespread metastatic disease. Index lesion in the left upper lobe measures 1.2 cm on image 51/8 (previously 1.2 cm). There are left lower lobe lesions measuring 1.1 cm on image 57/8 and 1.1 cm on image 100/8 (previously 1.2 and 1.1 cm, respectively). Upper abdomen: The visualized upper abdomen appears stable, without acute findings. Low-density lesion posteriorly in the left hepatic lobe measuring 2.1 cm  on image 124/2 is unchanged, corresponding with focal fat on MRI. No adrenal mass. Enhancing lesion in the interpolar region of the left kidney is grossly stable, measuring 2.2 cm on image 147/2. Musculoskeletal/Chest wall: There is no chest wall mass or suspicious osseous finding. Superior endplate compression fracture at L1 as seen on recent abdominal CT. Multilevel thoracic spondylosis. IMPRESSION: 1. No acute findings or significant change from recent prior studies. 2. Innumerable pulmonary nodules consistent with widespread metastatic disease, similar to the most recent prior study. No progressive disease identified. 3. Grossly stable enhancing lesion in the interpolar region of the left kidney, remaining consistent with renal cell carcinoma. 4. Stable moderate-sized hiatal hernia. 5.  Aortic Atherosclerosis (ICD10-I70.0). Electronically Signed   By: Elsie Perone M.D.   On: 03/06/2024 15:27   CT HEAD WO CONTRAST ( ) Result Date: 03/06/2024 EXAM: CT HEAD WITHOUT CONTRAST 03/06/2024 09:27:55 AM TECHNIQUE: CT of the head was performed without the administration of intravenous contrast. Automated exposure control, iterative reconstruction, and/or weight based adjustment of the mA/kV was utilized to reduce the radiation dose to as low as reasonably achievable. COMPARISON: CT of the head dated 10/20/2022. CLINICAL HISTORY: Mental status change, unknown cause. Abnormal Lab; Altered Mental Status; Diarrhea. FINDINGS: BRAIN AND VENTRICLES: No acute hemorrhage. No evidence of acute infarct. No hydrocephalus. No extra-axial collection. No mass effect or midline shift. There is age-related cerebral and cerebellar volume loss and mild-to-moderate periventricular white matter disease. ORBITS: No acute abnormality. The patient is status post bilateral lens replacement. SINUSES: No acute abnormality. SOFT TISSUES AND SKULL: No acute soft tissue abnormality. No skull fracture. IMPRESSION: 1. No acute intracranial  abnormality. 2. Age-related cerebral and cerebellar volume loss with mild-to-moderate chronic microvascular ischemic changes. Electronically signed by: Evalene Coho MD 03/06/2024 09:59 AM EDT RP Workstation: [id number]   DG Chest 2 View Result Date: 03/05/2024 CLINICAL DATA:  Tachypnea and hypoxia. EXAM: CHEST - 2 VIEW COMPARISON:  Chest x-ray 11/08/2022.  CT of the chest 09/09/2023. FINDINGS: There are increased central interstitial markings bilaterally. Previously identified small nodular densities are not well defined on this x-ray. There is a large hiatal hernia, unchanged. There is no pleural effusion or pneumothorax. IMPRESSION: 1. Increased central interstitial markings bilaterally, which may represent pulmonary edema or atypical infection. 2. Large hiatal hernia. Electronically Signed   By: Greig Pique M.D.   On: 03/05/2024 17:53   CT ABDOMEN PELVIS W CONTRAST Result Date: 03/05/2024 CLINICAL DATA:  Diarrhea. Worsening altered mental status. Diarrhea for 2 days. Nausea starting  today. EXAM: CT ABDOMEN AND PELVIS WITH CONTRAST TECHNIQUE: Multidetector CT imaging of the abdomen and pelvis was performed using the standard protocol following bolus administration of intravenous contrast. RADIATION DOSE REDUCTION: This exam was performed according to the departmental dose-optimization program which includes automated exposure control, adjustment of the mA and/or kV according to patient size and/or use of iterative reconstruction technique. CONTRAST:  OMNIPAQUE  IOHEXOL  300 MG/ML  SOLN COMPARISON:  Chest radiograph 02/11/2024. MRI abdomen 10/21/2023. CT abdomen and pelvis 11/08/2022 FINDINGS: Lower chest: Numerous pulmonary nodules demonstrated in both lung bases. Nodules were present on the previous study but appear more numerous today. This is likely metastatic. Large esophageal hiatal hernia. Hepatobiliary: Mild diffuse fatty infiltration of the liver. Focal lesion centrally in the liver  measuring 2.3 cm diameter. This has heterogeneous mostly hypodense appearance. No change since prior study. On prior MRI, this area demonstrates signal loss on out of phase T1 weighted images, likely indicating focal fatty infiltration. Gallbladder is surgically absent. No bile duct dilatation. Pancreas: Diffuse fatty atrophy of the pancreas. No acute abnormalities. Spleen: Normal in size without focal abnormality. Adrenals/Urinary Tract: No adrenal gland nodules. Renal parenchymal atrophy and scarring bilaterally. Left intrarenal stone measuring 3 mm diameter. Focal hypoenhancing lesion again demonstrated in the left kidney measuring 2.4 cm diameter. This was felt consistent with renal cell carcinoma on prior MRI characterization. No hydronephrosis or hydroureter. Bladder is normal. Stomach/Bowel: Stomach, small bowel, and colon are not abnormally distended. No wall thickening or inflammatory stranding. Sigmoid colonic diverticula without evidence of acute diverticulitis. Small duodenal diverticulum. Appendix is normal. Vascular/Lymphatic: Aortic atherosclerosis. No enlarged abdominal or pelvic lymph nodes. Reproductive: Uterus and bilateral adnexa are unremarkable. Other: No abdominal wall hernia or abnormality. No abdominopelvic ascites. Musculoskeletal: Degenerative changes in the spine. Compression of the superior endplate of L1 is new since prior study (CT abdomen and pelvis 11/08/2022 and lumbar spine radiographs 01/27/2024) and may represent acute compression. No destructive bone lesions are identified. Degenerative changes in the hips. IMPRESSION: 1. Numerous pulmonary nodules in the lung bases appear more numerous since on prior study. This is likely metastatic. 2. Large esophageal hiatal hernia. 3. Focal area of fatty infiltration demonstrated in the central liver. 4. Nonobstructing stone in the left kidney. 5. Hypoenhancing left renal mass measuring 2.4 cm diameter. This was present previously and felt  to be consistent with small renal cell carcinoma on prior MRI. 6. No evidence of bowel obstruction or inflammation. 7. Aortic atherosclerosis. 8. Superior endplate compression at L1 is new since prior study and may represent acute fracture. Electronically Signed   By: Elsie Gravely M.D.   On: 03/05/2024 17:52   MM 3D SCREENING MAMMOGRAM BILATERAL BREAST Result Date: 02/27/2024 CLINICAL DATA:  Screening. EXAM: DIGITAL SCREENING BILATERAL MAMMOGRAM WITH TOMOSYNTHESIS AND CAD TECHNIQUE: Bilateral screening digital craniocaudal and mediolateral oblique mammograms were obtained. Bilateral screening digital breast tomosynthesis was performed. The images were evaluated with computer-aided detection. COMPARISON:  Previous exam(s). ACR Breast Density Category b: There are scattered areas of fibroglandular density. FINDINGS: There are no findings suspicious for malignancy. IMPRESSION: No mammographic evidence of malignancy. A result letter of this screening mammogram will be mailed directly to the patient. RECOMMENDATION: Screening mammogram in one year. (Code:[id number]) BI-RADS CATEGORY  1: Negative. Electronically Signed   By: Alm Parkins M.D.   On: 02/27/2024 13:41    ASSESSMENT AND PLAN: This is a very pleasant 78 years old white female recently diagnosed with a stage IV (T1b, N0, M1 a) low-grade  neuroendocrine carcinoma (carcinoid tumor) presented with innumerable bilateral pulmonary nodules diagnosed in March 2023. The recent CT-guided core biopsy of the left lung nodule was consistent with low-grade neuroendocrine carcinoma. 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 Her recent imaging studies including CT scan of the chest, abdomen and pelvis showed no concerning findings for disease progression. I had a lengthy  discussion with the patient and her daughter today about her current condition and treatment options. 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 Carcinoid syndrome likely contributing to hormonal imbalances and symptoms such as diarrhea and hot flashes. - Start octreotide injections monthly to suppress hormonal release from the tumor and manage symptoms of carcinoid syndrome.  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 Diarrhea occurs approximately twice daily, which may not fully account for the severity of electrolyte disturbances. Octreotide injections proposed to manage diarrhea by counteracting hormonal effects from the tumor. If diarrhea is not tumor-related, octreotide may not be effective. - Start octreotide injections monthly to manage diarrhea. - Advise dietary modifications to reduce dairy and spicy foods to help control diarrhea.  Hypomagnesemia with ongoing electrolyte disturbances (including hypokalemia and hypophosphatemia) Persistent hypomagnesemia with recent magnesium  level of 0.6 mmol/L. Ongoing electrolyte disturbances include hypokalemia and hypophosphatemia, likely exacerbated by chronic diarrhea. Current magnesium  supplementation with magnesium  glycinate gummies (600 mg four times daily). - Repeat laboratory tests to assess current electrolyte levels. - Administer intravenous magnesium  if possible. - Consider nephrology referral if electrolyte disturbances persist despite diarrhea control. The patient was advised to call immediately if she has any other concerning symptoms in the interval. The patient voices understanding of current disease status and  treatment options and is in agreement with the current care plan.  All questions were answered. The patient knows to call the clinic with any problems, questions or concerns. We can certainly see the patient much sooner if necessary.  The total time spent in the appointment was 30 minutes.  Disclaimer: This note was dictated with voice recognition software. Similar sounding words can inadvertently be transcribed and may not be corrected upon review.

## 2024-03-19 ENCOUNTER — Encounter: Payer: Self-pay | Admitting: Internal Medicine

## 2024-03-20 ENCOUNTER — Other Ambulatory Visit: Payer: Self-pay | Admitting: Pharmacist

## 2024-03-20 ENCOUNTER — Inpatient Hospital Stay

## 2024-03-20 ENCOUNTER — Other Ambulatory Visit: Payer: Self-pay

## 2024-03-20 ENCOUNTER — Inpatient Hospital Stay (HOSPITAL_BASED_OUTPATIENT_CLINIC_OR_DEPARTMENT_OTHER): Admitting: Physician Assistant

## 2024-03-20 ENCOUNTER — Telehealth: Payer: Self-pay

## 2024-03-20 VITALS — BP 164/74 | HR 68 | Temp 97.3°F | Resp 20 | Wt 179.8 lb

## 2024-03-20 DIAGNOSIS — R197 Diarrhea, unspecified: Secondary | ICD-10-CM | POA: Diagnosis not present

## 2024-03-20 DIAGNOSIS — E1122 Type 2 diabetes mellitus with diabetic chronic kidney disease: Secondary | ICD-10-CM | POA: Diagnosis not present

## 2024-03-20 DIAGNOSIS — I129 Hypertensive chronic kidney disease with stage 1 through stage 4 chronic kidney disease, or unspecified chronic kidney disease: Secondary | ICD-10-CM | POA: Diagnosis not present

## 2024-03-20 DIAGNOSIS — R110 Nausea: Secondary | ICD-10-CM | POA: Diagnosis not present

## 2024-03-20 DIAGNOSIS — C7A090 Malignant carcinoid tumor of the bronchus and lung: Secondary | ICD-10-CM

## 2024-03-20 LAB — CMP (CANCER CENTER ONLY)
ALT: 19 U/L (ref 0–44)
AST: 18 U/L (ref 15–41)
Albumin: 3.8 g/dL (ref 3.5–5.0)
Alkaline Phosphatase: 87 U/L (ref 38–126)
Anion gap: 9 (ref 5–15)
BUN: 11 mg/dL (ref 8–23)
CO2: 24 mmol/L (ref 22–32)
Calcium: 8.4 mg/dL — ABNORMAL LOW (ref 8.9–10.3)
Chloride: 105 mmol/L (ref 98–111)
Creatinine: 0.85 mg/dL (ref 0.44–1.00)
GFR, Estimated: >60 mL/min (ref >60)
Glucose, Bld: 294 mg/dL — ABNORMAL HIGH (ref 70–99)
Potassium: 3.8 mmol/L (ref 3.5–5.1)
Sodium: 138 mmol/L (ref 135–145)
Total Bilirubin: 0.6 mg/dL (ref 0.0–1.2)
Total Protein: 6.6 g/dL (ref 6.5–8.1)

## 2024-03-20 LAB — CBC WITH DIFFERENTIAL (CANCER CENTER ONLY)
Abs Immature Granulocytes: 0.01 K/uL (ref 0.00–0.07)
Basophils Absolute: 0.1 K/uL (ref 0.0–0.1)
Basophils Relative: 1 %
Eosinophils Absolute: 0.3 K/uL (ref 0.0–0.5)
Eosinophils Relative: 5 %
HCT: 38.8 % (ref 36.0–46.0)
Hemoglobin: 13.6 g/dL (ref 12.0–15.0)
Immature Granulocytes: 0 %
Lymphocytes Relative: 21 %
Lymphs Abs: 1.5 K/uL (ref 0.7–4.0)
MCH: 31.6 pg (ref 26.0–34.0)
MCHC: 35.1 g/dL (ref 30.0–36.0)
MCV: 90.2 fL (ref 80.0–100.0)
Monocytes Absolute: 0.4 K/uL (ref 0.1–1.0)
Monocytes Relative: 6 %
Neutro Abs: 4.5 K/uL (ref 1.7–7.7)
Neutrophils Relative %: 67 %
Platelet Count: 287 K/uL (ref 150–400)
RBC: 4.3 MIL/uL (ref 3.87–5.11)
RDW: 13.2 % (ref 11.5–15.5)
WBC Count: 6.8 K/uL (ref 4.0–10.5)
nRBC: 0 % (ref 0.0–0.2)

## 2024-03-20 LAB — MAGNESIUM: Magnesium: 1 mg/dL — ABNORMAL LOW (ref 1.7–2.4)

## 2024-03-20 MED ORDER — MAGNESIUM SULFATE 4 GM/100ML IV SOLN
4.0000 g | Freq: Once | INTRAVENOUS | Status: AC
  Administered 2024-03-20: 4 g via INTRAVENOUS
  Filled 2024-03-20: qty 100

## 2024-03-20 MED ORDER — SODIUM CHLORIDE 0.9 % IV SOLN: Freq: Once | INTRAVENOUS | Status: AC

## 2024-03-20 NOTE — Patient Instructions (Signed)
 Magnesium Sulfate Injection What is this medication? MAGNESIUM SULFATE (mag NEE zee um SUL fate) prevents and treats low levels of magnesium in your body. It may also be used to prevent and treat seizures during pregnancy in people with high blood pressure disorders, such as preeclampsia or eclampsia. Magnesium plays an important role in maintaining the health of your muscles and nervous system. This medicine may be used for other purposes; ask your health care provider or pharmacist if you have questions. What should I tell my care team before I take this medication? They need to know if you have any of these conditions: Heart disease History of irregular heart beat Kidney disease An unusual or allergic reaction to magnesium sulfate, medications, foods, dyes, or preservatives Pregnant or trying to get pregnant Breast-feeding How should I use this medication? This medication is for infusion into a vein. It is given in a hospital or clinic setting. Talk to your care team about the use of this medication in children. While this medication may be prescribed for selected conditions, precautions do apply. Overdosage: If you think you have taken too much of this medicine contact a poison control center or emergency room at once. NOTE: This medicine is only for you. Do not share this medicine with others. What if I miss a dose? This does not apply. What may interact with this medication? Certain medications for anxiety or sleep Certain medications for seizures, such phenobarbital Digoxin Medications that relax muscles for surgery Narcotic medications for pain This list may not describe all possible interactions. Give your health care provider a list of all the medicines, herbs, non-prescription drugs, or dietary supplements you use. Also tell them if you smoke, drink alcohol, or use illegal drugs. Some items may interact with your medicine. What should I watch for while using this  medication? Your condition will be monitored carefully while you are receiving this medication. You may need blood work done while you are receiving this medication. What side effects may I notice from receiving this medication? Side effects that you should report to your care team as soon as possible: Allergic reactions--skin rash, itching, hives, swelling of the face, lips, tongue, or throat High magnesium level--confusion, drowsiness, facial flushing, redness, sweating, muscle weakness, fast or irregular heartbeat, trouble breathing Low blood pressure--dizziness, feeling faint or lightheaded, blurry vision Side effects that usually do not require medical attention (report to your care team if they continue or are bothersome): Headache Nausea This list may not describe all possible side effects. Call your doctor for medical advice about side effects. You may report side effects to FDA at 1-800-FDA-1088. Where should I keep my medication? This medication is given in a hospital or clinic and will not be stored at home. NOTE: This sheet is a summary. It may not cover all possible information. If you have questions about this medicine, talk to your doctor, pharmacist, or health care provider.  2024 Elsevier/Gold Standard (2021-02-08 00:00:00)

## 2024-03-20 NOTE — Progress Notes (Signed)
 Symptom Management Consult Note [facility]    Patient Care Team: Chrystal Lamarr RAMAN, MD as PCP - General (Family Medicine)    Name / MRN / DOB: Michaela Santana  [id number]  19-Jun-1945   Date of visit: 03/20/2024   Chief Complaint/Reason for visit: recheck labs    ASSESSMENT AND PLAN Patient is a 78 y.o. female with oncologic history of stage four low grade neuroendocrine carcinoma followed by Dr. Sherrod.  I have viewed most recent oncology note and lab work.  #Stage four low grade neuroendocrine carcinoma  - Per last note oncology note plan is to start Octreotide however it was denied by insurance. Daughter has letter from insurance stating Sandostatin is recommended. Will inform oncologist.    #Hypomagnesemia Magnesium  level improved to 1.0 from 0.6,  - Administer 4g IV magnesium  infusion ini clinic today with  - Continue magnesium  glycinate gummies, 600 mg twice daily. - Diarrhea frequency improving per HPI.   Strict ED precautions discussed should symptoms worsen.   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    Michaela Santana is a 78 y.o. female with oncologic history as above presenting to [facility] today with chief complaint of recheck labs. Patient is accompanied by family member who provides additional history.  She has been experiencing persistent diarrhea, which was exacerbated by a recent norovirus infection. The diarrhea has been ongoing however and was present before the  norovirus infection. She had two episodes of diarrhea yesterday, which were softer and more formed than previous episodes, and one episode today with similar consistency. No vomiting, but she has nausea, which she attributes to nerves. She took Zofran  for nausea PTA with symptom relief.   She was recently hospitalized, and during a follow-up with her primary care physician, it was discovered that her magnesium  levels were low. She is currently taking magnesium  glycinate gummies, 600 mg each, two in the morning and two at night, totaling 2400 mg daily per daughter. These are over-the-counter gummies that she prefers due to their flavor.  No abdominal pain and no fevers. She is trying to manage her symptoms through dietary changes, including eating more leafy greens to increase her magnesium  and potassium intake.      ROS  All other systems are reviewed and are negative for acute change except as noted in the HPI.    Allergies  Allergen Reactions   Codeine Hypertension    Other Reaction(s): makes her hyper   Alprazolam  Other (See Comments)    lethargic  Other Reaction(s): lethargic/drunk acting     Past Medical History:  Diagnosis Date   Allergic rhinitis    Anemia    Arthritis    probably in hands (07/17/2016)   CKD (chronic kidney disease), stage III (HCC)    Depression    Esophageal reflux    Family history of adverse reaction to anesthesia    her father had some issues when he had his gallbladder taken out when he was in  his late 23s   GERD (gastroesophageal reflux disease)    Glaucoma    open angle, bilateral   High cholesterol    History of kidney stones    Hypercholesterolemia    Hypertension    Kidney cysts    Lumbago with sciatica, right side    Pulmonary embolism (HCC)    Sepsis (HCC) 07/2016   d/t flu   Type II diabetes mellitus (HCC)    Vitamin D deficiency      Past Surgical History:  Procedure Laterality Date   BREAST BIOPSY  1990   ? side; it  wasn't anything   BRONCHIAL BIOPSY  08/01/2021   Procedure: BRONCHIAL BIOPSIES;  Surgeon: Brenna Adine CROME, DO;  Location: [facility] ENDOSCOPY;  Service: Pulmonary;;   BRONCHIAL NEEDLE ASPIRATION BIOPSY  08/01/2021   Procedure: BRONCHIAL NEEDLE ASPIRATION BIOPSIES;  Surgeon: Brenna Adine CROME, DO;  Location: [facility] ENDOSCOPY;  Service: Pulmonary;;   CATARACT EXTRACTION, BILATERAL  2021   CHOLECYSTECTOMY OPEN  1971   COLONOSCOPY     2001,2011,2023   ESOPHAGOGASTRODUODENOSCOPY (EGD) WITH PROPOFOL  N/A 11/10/2022   Procedure: ESOPHAGOGASTRODUODENOSCOPY (EGD) WITH PROPOFOL ;  Surgeon: Burnette Fallow, MD;  Location[facility];  Service: Gastroenterology;  Laterality: N/A;   TUBAL LIGATION  1978   VIDEO BRONCHOSCOPY WITH RADIAL ENDOBRONCHIAL ULTRASOUND  08/01/2021   Procedure: RADIAL ENDOBRONCHIAL ULTRASOUND;  Surgeon: Brenna Adine CROME, DO;  Location: [facility] ENDOSCOPY;  Service: Pulmonary;;    Social History   Socioeconomic History   Marital status: Widowed    Spouse name: Not on file   Number of children: 2   Years of education: Not on file   Highest education level: Some college, no degree  Occupational History   Occupation: Geophysicist/field seismologist  Tobacco Use   Smoking status: Never   Smokeless tobacco: Never  Vaping Use   Vaping status: Never Used  Substance and Sexual Activity   Alcohol use: Never   Drug use: Never   Sexual activity: Not on file  Other Topics Concern   Not on file  Social History Narrative   02/08/20 Lives alone   caffeine soda 1-2 daily   Social Drivers of Corporate investment banker Strain: Not on file  Food Insecurity: No Food Insecurity (03/06/2024)   Hunger Vital Sign    Worried About Running Out of Food in the Last Year: Never true    Ran Out of Food in the Last Year: Never true  Transportation Needs: No Transportation Needs (03/06/2024)   PRAPARE - Administrator, Civil Service (Medical): No    Lack of Transportation (Non-Medical): No  Physical Activity: Not on  file  Stress: Not on file  Social Connections: Socially Isolated (03/06/2024)   Social Connection and Isolation Panel    Frequency of Communication with Friends and Family: Three times a week    Frequency of Social Gatherings with Friends and Family: Twice a week    Attends Religious Services: Never    Database administrator or Organizations: No    Attends Banker Meetings: Never    Marital Status: Widowed  Intimate Partner Violence: Not At Risk (03/06/2024)   Humiliation, Afraid, Rape, and Kick questionnaire    Fear of Current or Ex-Partner: No    Emotionally Abused: No    Physically Abused: No    Sexually Abused: No    Family History  Problem Relation Age of Onset   Dementia Mother    Diabetes Mother    Stroke Father  Hypertension Father    CVA Father    Diabetes Sister    Hypertension Sister    Heart disease Paternal Grandfather    Breast cancer Neg Hx      Current Outpatient Medications:    acetaminophen  (TYLENOL ) 650 MG CR tablet, Take 650 mg by mouth every 8 (eight) hours as needed for pain., Disp: , Rfl:    albuterol  (VENTOLIN  HFA) 108 (90 Base) MCG/ACT inhaler, Inhale into the lungs every 6 (six) hours as needed for wheezing or shortness of breath., Disp: , Rfl:    amLODipine  (NORVASC ) 5 MG tablet, Take 5 mg by mouth daily., Disp: , Rfl:    apixaban  (ELIQUIS ) 5 MG TABS tablet, Take 1 tablet (5 mg total) by mouth 2 (two) times daily., Disp: 60 tablet, Rfl: 3   Artificial Tear Solution (SYSTANE CONTACTS OP), Apply 0.4 drops to eye as needed., Disp: , Rfl:    Blood Glucose Monitoring Suppl (FREESTYLE LITE) DEVI, USE UTD (Patient taking differently: as needed.), Disp: , Rfl: 0   calcium  carbonate (TUMS - DOSED IN MG ELEMENTAL CALCIUM ) 500 MG chewable tablet, Chew 1 tablet by mouth daily., Disp: , Rfl:    DULoxetine  HCl 40 MG CPEP, Take 1 capsule by mouth daily., Disp: , Rfl:    ferrous sulfate 325 (65 FE) MG EC tablet, Take 325 mg by mouth daily. (Patient  taking differently: Take 325 mg by mouth 2 (two) times a week.), Disp: , Rfl:    FREESTYLE LITE test strip, USE TO CHECK FASTING GLUCOSE IN THE MORNING AND 2 HOURS AFTER DINNER OR LUNCH, Disp: , Rfl: 3   furosemide  (LASIX ) 20 MG tablet, Take 1 tablet (20 mg total) by mouth as needed for fluid or edema (swelling). Pt needs to keep upcoming appt in Oct for further refills (Patient taking differently: Take 20 mg by mouth daily. Pt needs to keep upcoming appt in Oct for further refills), Disp: 90 tablet, Rfl: 0   gabapentin  (NEURONTIN ) 300 MG capsule, Take 300 mg by mouth every evening., Disp: , Rfl:    glipiZIDE  (GLUCOTROL  XL) 5 MG 24 hr tablet, Take 5 mg by mouth daily with breakfast., Disp: , Rfl:    Lancets (FREESTYLE) lancets, , Disp: , Rfl:    latanoprost  (XALATAN ) 0.005 % ophthalmic solution, Place 1 drop into the right eye at bedtime., Disp: , Rfl:    loperamide  (IMODIUM ) 2 MG capsule, Take 1 capsule (2 mg total) by mouth every 8 (eight) hours as needed for diarrhea or loose stools., Disp: , Rfl:    loratadine (CLARITIN) 10 MG tablet, Take 10 mg by mouth daily as needed for allergies., Disp: , Rfl:    LORazepam  (ATIVAN ) 0.5 MG tablet, Take 0.5 mg by mouth every 8 (eight) hours. (Patient taking differently: Take 0.5 mg by mouth at bedtime.), Disp: , Rfl:    losartan  (COZAAR ) 100 MG tablet, Take 0.5 tablets (50 mg total) by mouth daily., Disp: 30 tablet, Rfl: 1   magnesium  oxide (MAG-OX) 400 (240 Mg) MG tablet, Take 2 tablets (800 mg total) by mouth 2 (two) times daily., Disp: 60 tablet, Rfl: 0   ondansetron  (ZOFRAN ) 8 MG tablet, Take 1 tablet (8 mg total) by mouth every 8 (eight) hours as needed., Disp: 30 tablet, Rfl: 1   oxymetazoline (AFRIN) 0.05 % nasal spray, Place 1 spray into both nostrils 2 (two) times daily as needed for congestion., Disp: , Rfl:    pantoprazole  (PROTONIX ) 40 MG tablet, Take 1 tablet (40 mg total)  by mouth 2 (two) times daily before a meal. (Patient taking differently: Take  40 mg by mouth daily.), Disp: 120 tablet, Rfl: 3   potassium chloride  (KLOR-CON ) 20 MEQ packet, Take 20 mEq by mouth daily., Disp: , Rfl:    QUEtiapine  (SEROQUEL ) 25 MG tablet, Take 12.5 mg by mouth every other day., Disp: , Rfl:    vitamin B-12 (CYANOCOBALAMIN ) 1000 MCG tablet, Take 1 tablet (1,000 mcg total) by mouth daily., Disp: 30 tablet, Rfl: 0 No current facility-administered medications for this visit.  Facility-Administered Medications Ordered in Other Visits:    magnesium  sulfate IVPB 4 g 100 mL, 4 g, Intravenous, Once, Walisiewicz, Larri Brewton E, PA-C, Last Rate: 50 mL/hr at 03/20/24 1238, 4 g at 03/20/24 1238  PHYSICAL EXAM ECOG FS:1 - Symptomatic but completely ambulatory    Vitals:   03/20/24 1155  BP: (!) 164/74  Pulse: 68  Resp: 20  Temp: (!) 97.3 F (36.3 C)  TempSrc: Oral  SpO2: 94%  Weight: 179 lb 12.8 oz (81.6 kg)   Physical Exam Vitals and nursing note reviewed.  Constitutional:      Appearance: She is not ill-appearing or toxic-appearing.  HENT:     Head: Normocephalic.  Eyes:     Conjunctiva/sclera: Conjunctivae normal.  Cardiovascular:     Rate and Rhythm: Normal rate.  Pulmonary:     Effort: Pulmonary effort is normal.  Abdominal:     General: There is no distension.     Tenderness: There is no abdominal tenderness.  Musculoskeletal:     Cervical back: Normal range of motion.  Skin:    General: Skin is warm and dry.  Neurological:     Mental Status: She is alert.        LABORATORY DATA I have reviewed the data as listed    Latest Ref Rng & Units 03/20/2024   11:29 AM 03/18/2024   11:14 AM 03/07/2024    5:14 AM  CBC  WBC 4.0 - 10.5 K/uL 6.8  9.7  7.3   Hemoglobin 12.0 - 15.0 g/dL 86.3  85.5  86.8   Hematocrit 36.0 - 46.0 % 38.8  41.0  41.3   Platelets 150 - 400 K/uL 287  302  282         Latest Ref Rng & Units 03/20/2024   11:29 AM 03/18/2024   11:14 AM 03/07/2024    5:19 AM  CMP  Glucose 70 - 99 mg/dL 705  775  794   BUN 8 - 23  mg/dL 11  18  11    Creatinine 0.44 - 1.00 mg/dL 9.14  9.00  9.17   Sodium 135 - 145 mmol/L 138  136  135   Potassium 3.5 - 5.1 mmol/L 3.8  3.4  3.8   Chloride 98 - 111 mmol/L 105  100  104   CO2 22 - 32 mmol/L 24  24  19    Calcium  8.9 - 10.3 mg/dL 8.4  7.8  8.3   Total Protein 6.5 - 8.1 g/dL 6.6  7.7    Total Bilirubin 0.0 - 1.2 mg/dL 0.6  0.7    Alkaline Phos 38 - 126 U/L 87  98    AST 15 - 41 U/L 18  23    ALT 0 - 44 U/L 19  23         RADIOGRAPHIC STUDIES (from last 24 hours if applicable) I have personally reviewed the radiological images as listed and agreed with the findings in the report. No  results found.      Visit Diagnosis: 1. 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 Hypomagnesemia      No orders of the defined types were placed in this encounter.   All questions were answered. The patient knows to call the clinic with any problems, questions or concerns. No barriers to learning was detected.  A total of more than 30 minutes were spent on this encounter with face-to-face time and non-face-to-face time, including preparing to see the patient, ordering tests and/or medications, counseling the patient and coordination of care as outlined above.    Thank you for allowing me to participate in the care of this patient.    Isaack Preble E  Walisiewicz, PA-C Department of Hematology/Oncology [facility] at [facility] Phone: [phone]  [phone]    03/20/2024 1:45 PM

## 2024-03-20 NOTE — Telephone Encounter (Signed)
 S/w patient's daughter, [name], in regards to myChart message expressing concerns related to patient's ongoing weakness and concerns for magnesium  levels. Daughter encouraged to bring patient in for repeat lab work and evaluation at symptom management clinic. Mallie, PA-C aware and patient scheduled for lab work and [facility] appointments today.  Daughter confirmed appointments and will bring patient in. Reached out to pharmacy and PA team regarding somatuline approval. Awaiting confirmation.

## 2024-03-23 ENCOUNTER — Encounter: Payer: Self-pay | Admitting: *Deleted

## 2024-03-26 ENCOUNTER — Telehealth: Payer: Self-pay | Admitting: Medical Oncology

## 2024-03-26 ENCOUNTER — Inpatient Hospital Stay

## 2024-03-26 ENCOUNTER — Other Ambulatory Visit: Payer: Self-pay | Admitting: Physician Assistant

## 2024-03-26 ENCOUNTER — Other Ambulatory Visit: Payer: Self-pay | Admitting: Medical Oncology

## 2024-03-26 VITALS — BP 147/77 | HR 66 | Temp 98.0°F | Resp 18

## 2024-03-26 DIAGNOSIS — F02B0 Dementia in other diseases classified elsewhere, moderate, without behavioral disturbance, psychotic disturbance, mood disturbance, and anxiety: Secondary | ICD-10-CM | POA: Diagnosis not present

## 2024-03-26 DIAGNOSIS — E1122 Type 2 diabetes mellitus with diabetic chronic kidney disease: Secondary | ICD-10-CM | POA: Diagnosis not present

## 2024-03-26 DIAGNOSIS — C7A090 Malignant carcinoid tumor of the bronchus and lung: Secondary | ICD-10-CM | POA: Diagnosis not present

## 2024-03-26 DIAGNOSIS — R110 Nausea: Secondary | ICD-10-CM | POA: Diagnosis not present

## 2024-03-26 DIAGNOSIS — R197 Diarrhea, unspecified: Secondary | ICD-10-CM | POA: Diagnosis not present

## 2024-03-26 DIAGNOSIS — I129 Hypertensive chronic kidney disease with stage 1 through stage 4 chronic kidney disease, or unspecified chronic kidney disease: Secondary | ICD-10-CM | POA: Diagnosis not present

## 2024-03-26 LAB — MAGNESIUM: Magnesium: 0.9 mg/dL — CL (ref 1.7–2.4)

## 2024-03-26 MED ORDER — LANREOTIDE ACETATE 120 MG/0.5ML ~~LOC~~ SOLN
120.0000 mg | Freq: Once | SUBCUTANEOUS | Status: AC
  Administered 2024-03-26: 120 mg via SUBCUTANEOUS
  Filled 2024-03-26: qty 120

## 2024-03-26 NOTE — Telephone Encounter (Signed)
 Dtr concerned about pt magnesium  and asking to get it checked after her injection today .Michaela Santana  Pt scheduled for IV magnesium  tomorrow.

## 2024-03-26 NOTE — Telephone Encounter (Signed)
 Dtr asking if pt needs her magnesium  checked.  Michaela Santana does not have  diarrhea . She is eating  and she is irritable.  She has switched her mag oxide 800 mg tablets bid to  magnesium  ? Citrate gummies 600 mg 2 bid.  Lab appt give to dtr .

## 2024-03-27 ENCOUNTER — Inpatient Hospital Stay

## 2024-03-27 DIAGNOSIS — R110 Nausea: Secondary | ICD-10-CM | POA: Diagnosis not present

## 2024-03-27 DIAGNOSIS — I129 Hypertensive chronic kidney disease with stage 1 through stage 4 chronic kidney disease, or unspecified chronic kidney disease: Secondary | ICD-10-CM | POA: Diagnosis not present

## 2024-03-27 DIAGNOSIS — C7A090 Malignant carcinoid tumor of the bronchus and lung: Secondary | ICD-10-CM | POA: Diagnosis not present

## 2024-03-27 DIAGNOSIS — E1122 Type 2 diabetes mellitus with diabetic chronic kidney disease: Secondary | ICD-10-CM | POA: Diagnosis not present

## 2024-03-27 DIAGNOSIS — R197 Diarrhea, unspecified: Secondary | ICD-10-CM | POA: Diagnosis not present

## 2024-03-27 MED ORDER — SODIUM CHLORIDE 0.9 % IV SOLN: Freq: Once | INTRAVENOUS | Status: AC

## 2024-03-27 MED ORDER — MAGNESIUM SULFATE 4 GM/100ML IV SOLN
4.0000 g | Freq: Once | INTRAVENOUS | Status: AC
  Administered 2024-03-27: 4 g via INTRAVENOUS
  Filled 2024-03-27: qty 100

## 2024-03-27 NOTE — Patient Instructions (Signed)
 Hypomagnesemia Hypomagnesemia is a condition in which the level of magnesium  in the blood is too low. Magnesium  is a mineral that is found in many foods. It is used in many different processes in the body. Hypomagnesemia can affect every organ in the body. In severe cases, it can cause life-threatening problems. What are the causes? This condition may be caused by: Not getting enough magnesium  in your diet or not having enough healthy foods to eat (malnutrition). Problems with magnesium  absorption in the intestines. Dehydration. Excessive use of alcohol. Vomiting. Severe or long-term (chronic) diarrhea. Some medicines, including medicines that make you urinate more often (diuretics). Certain diseases, such as kidney disease, diabetes, celiac disease, and overactive thyroid . What are the signs or symptoms? Symptoms of this condition include: Loss of appetite, nausea, and vomiting. Involuntary shaking or trembling of a body part (tremor). Muscle weakness or tingling in the arms and legs. Sudden tightening of muscles (muscle spasms). Confusion. Psychiatric issues, such as: Depression and irritability. Psychosis. A feeling of fluttering of the heart (palpitations). Seizures. These symptoms are more severe if magnesium  levels drop suddenly. How is this diagnosed? This condition may be diagnosed based on: Your symptoms and medical history. A physical exam. Blood and urine tests. How is this treated? Treatment depends on the cause and the severity of the condition. It may be treated by: Taking a magnesium  supplement. This can be taken in pill form. If the condition is severe, magnesium  is usually given through an IV. Making changes to your diet. You may be directed to eat foods that have a lot of magnesium , such as green leafy vegetables, peas, beans, and nuts. Not drinking alcohol. If you are struggling not to drink, ask your health care provider for help. Follow these instructions at  home: Eating and drinking     Make sure that your diet includes foods with magnesium . Foods that have a lot of magnesium  in them include: Green leafy vegetables, such as spinach and broccoli. Beans and peas. Nuts and seeds, such as almonds and sunflower seeds. Whole grains, such as whole grain bread and fortified cereals. Drink fluids that contain salts and minerals (electrolytes), such as sports drinks, when you are active. Do not drink alcohol. General instructions Take over-the-counter and prescription medicines only as told by your health care provider. Take magnesium  supplements as directed if your health care provider tells you to take them. Have your magnesium  levels monitored as told by your health care provider. Keep all follow-up visits. This is important. Contact a health care provider if: You get worse instead of better. Your symptoms return. Get help right away if: You develop severe muscle weakness. You have trouble breathing. You feel that your heart is racing. These symptoms may represent a serious problem that is an emergency. Do not wait to see if the symptoms will go away. Get medical help right away. Call your local emergency services (911 in the [place] Do not drive yourself to the hospital. Summary Hypomagnesemia is a condition in which the level of magnesium  in the blood is too low. Hypomagnesemia can affect every organ in the body. Treatment may include eating more foods that contain magnesium , taking magnesium  supplements, and not drinking alcohol. Have your magnesium  levels monitored as told by your health care provider. This information is not intended to replace advice given to you by your health care provider. Make sure you discuss any questions you have with your health care provider. Document Revised: 10/25/2020 Document Reviewed: 10/25/2020 Elsevier Patient Education  2024 Elsevier Inc.

## 2024-03-30 ENCOUNTER — Encounter: Payer: Self-pay | Admitting: Internal Medicine

## 2024-03-30 ENCOUNTER — Telehealth: Payer: Self-pay | Admitting: *Deleted

## 2024-03-30 ENCOUNTER — Encounter: Payer: Self-pay | Admitting: Family Medicine

## 2024-03-30 ENCOUNTER — Other Ambulatory Visit: Payer: Self-pay | Admitting: *Deleted

## 2024-03-30 DIAGNOSIS — C7A090 Malignant carcinoid tumor of the bronchus and lung: Secondary | ICD-10-CM

## 2024-03-30 NOTE — Telephone Encounter (Signed)
 Referral to [facility] for hypomagnesia

## 2024-03-31 ENCOUNTER — Telehealth: Payer: Self-pay

## 2024-03-31 NOTE — Telephone Encounter (Signed)
 Spoke with patient's daughter, Michaela Santana, regarding a MyChart message. Per Dr. Sherrod, the patient is being referred to [facility]; referral has been placed. Informed the daughter that the patient may have weekly lab checks to monitor magnesium  levels until seen by [facility]. Lab appointments have been scheduled for 10/23 and 10/30 at 11:15 AM.  Advised that if the patient has not been seen by [facility] following her appointment with Dr. Sherrod on 04/16/24, additional lab appointments will be arranged.  Daughter also reported that she has been experiencing some diarrhea and has started taking liquid Imodium . Advised her not to exceed the dosage listed on the packaging and to contact our office if symptoms persist. She voiced understanding.

## 2024-04-01 ENCOUNTER — Other Ambulatory Visit: Payer: Self-pay | Admitting: *Deleted

## 2024-04-01 DIAGNOSIS — C7A090 Malignant carcinoid tumor of the bronchus and lung: Secondary | ICD-10-CM

## 2024-04-02 ENCOUNTER — Other Ambulatory Visit: Payer: Self-pay | Admitting: Physician Assistant

## 2024-04-02 ENCOUNTER — Inpatient Hospital Stay

## 2024-04-02 ENCOUNTER — Telehealth: Payer: Self-pay | Admitting: *Deleted

## 2024-04-02 DIAGNOSIS — R110 Nausea: Secondary | ICD-10-CM | POA: Diagnosis not present

## 2024-04-02 DIAGNOSIS — E1122 Type 2 diabetes mellitus with diabetic chronic kidney disease: Secondary | ICD-10-CM | POA: Diagnosis not present

## 2024-04-02 DIAGNOSIS — C7A090 Malignant carcinoid tumor of the bronchus and lung: Secondary | ICD-10-CM | POA: Diagnosis not present

## 2024-04-02 DIAGNOSIS — I129 Hypertensive chronic kidney disease with stage 1 through stage 4 chronic kidney disease, or unspecified chronic kidney disease: Secondary | ICD-10-CM | POA: Diagnosis not present

## 2024-04-02 DIAGNOSIS — R197 Diarrhea, unspecified: Secondary | ICD-10-CM | POA: Diagnosis not present

## 2024-04-02 LAB — CBC WITH DIFFERENTIAL (CANCER CENTER ONLY)
Abs Immature Granulocytes: 0.02 K/uL (ref 0.00–0.07)
Basophils Absolute: 0.1 K/uL (ref 0.0–0.1)
Basophils Relative: 1 %
Eosinophils Absolute: 0.4 K/uL (ref 0.0–0.5)
Eosinophils Relative: 5 %
HCT: 44.7 % (ref 36.0–46.0)
Hemoglobin: 15.2 g/dL — ABNORMAL HIGH (ref 12.0–15.0)
Immature Granulocytes: 0 %
Lymphocytes Relative: 27 %
Lymphs Abs: 2.3 K/uL (ref 0.7–4.0)
MCH: 31.3 pg (ref 26.0–34.0)
MCHC: 34 g/dL (ref 30.0–36.0)
MCV: 92 fL (ref 80.0–100.0)
Monocytes Absolute: 0.6 K/uL (ref 0.1–1.0)
Monocytes Relative: 7 %
Neutro Abs: 5.1 K/uL (ref 1.7–7.7)
Neutrophils Relative %: 60 %
Platelet Count: 306 K/uL (ref 150–400)
RBC: 4.86 MIL/uL (ref 3.87–5.11)
RDW: 13 % (ref 11.5–15.5)
WBC Count: 8.5 K/uL (ref 4.0–10.5)
nRBC: 0 % (ref 0.0–0.2)

## 2024-04-02 LAB — CMP (CANCER CENTER ONLY)
ALT: 20 U/L (ref 0–44)
AST: 18 U/L (ref 15–41)
Albumin: 4 g/dL (ref 3.5–5.0)
Alkaline Phosphatase: 114 U/L (ref 38–126)
Anion gap: 8 (ref 5–15)
BUN: 19 mg/dL (ref 8–23)
CO2: 29 mmol/L (ref 22–32)
Calcium: 9.4 mg/dL (ref 8.9–10.3)
Chloride: 100 mmol/L (ref 98–111)
Creatinine: 1.09 mg/dL — ABNORMAL HIGH (ref 0.44–1.00)
GFR, Estimated: 52 mL/min — ABNORMAL LOW (ref >60)
Glucose, Bld: 220 mg/dL — ABNORMAL HIGH (ref 70–99)
Potassium: 4.3 mmol/L (ref 3.5–5.1)
Sodium: 137 mmol/L (ref 135–145)
Total Bilirubin: 0.7 mg/dL (ref 0.0–1.2)
Total Protein: 7.1 g/dL (ref 6.5–8.1)

## 2024-04-02 LAB — MAGNESIUM: Magnesium: 1.1 mg/dL — ABNORMAL LOW (ref 1.7–2.4)

## 2024-04-02 NOTE — Telephone Encounter (Signed)
 Called and left message on daughter phone with appt information for 1130 on 10/25 for IV magnesium . Advised to call office if there were questions

## 2024-04-04 ENCOUNTER — Inpatient Hospital Stay

## 2024-04-04 DIAGNOSIS — C7A090 Malignant carcinoid tumor of the bronchus and lung: Secondary | ICD-10-CM | POA: Diagnosis not present

## 2024-04-04 DIAGNOSIS — R197 Diarrhea, unspecified: Secondary | ICD-10-CM | POA: Diagnosis not present

## 2024-04-04 DIAGNOSIS — R110 Nausea: Secondary | ICD-10-CM | POA: Diagnosis not present

## 2024-04-04 DIAGNOSIS — I129 Hypertensive chronic kidney disease with stage 1 through stage 4 chronic kidney disease, or unspecified chronic kidney disease: Secondary | ICD-10-CM | POA: Diagnosis not present

## 2024-04-04 DIAGNOSIS — E1122 Type 2 diabetes mellitus with diabetic chronic kidney disease: Secondary | ICD-10-CM | POA: Diagnosis not present

## 2024-04-04 MED ORDER — MAGNESIUM SULFATE 4 GM/100ML IV SOLN
4.0000 g | Freq: Once | INTRAVENOUS | Status: AC
  Administered 2024-04-04: 4 g via INTRAVENOUS
  Filled 2024-04-04: qty 100

## 2024-04-04 NOTE — Patient Instructions (Signed)
 Magnesium Sulfate Injection What is this medication? MAGNESIUM SULFATE (mag NEE zee um SUL fate) prevents and treats low levels of magnesium in your body. It may also be used to prevent and treat seizures during pregnancy in people with high blood pressure disorders, such as preeclampsia or eclampsia. Magnesium plays an important role in maintaining the health of your muscles and nervous system. This medicine may be used for other purposes; ask your health care provider or pharmacist if you have questions. What should I tell my care team before I take this medication? They need to know if you have any of these conditions: Heart disease History of irregular heart beat Kidney disease An unusual or allergic reaction to magnesium sulfate, medications, foods, dyes, or preservatives Pregnant or trying to get pregnant Breast-feeding How should I use this medication? This medication is for infusion into a vein. It is given in a hospital or clinic setting. Talk to your care team about the use of this medication in children. While this medication may be prescribed for selected conditions, precautions do apply. Overdosage: If you think you have taken too much of this medicine contact a poison control center or emergency room at once. NOTE: This medicine is only for you. Do not share this medicine with others. What if I miss a dose? This does not apply. What may interact with this medication? Certain medications for anxiety or sleep Certain medications for seizures, such phenobarbital Digoxin Medications that relax muscles for surgery Narcotic medications for pain This list may not describe all possible interactions. Give your health care provider a list of all the medicines, herbs, non-prescription drugs, or dietary supplements you use. Also tell them if you smoke, drink alcohol, or use illegal drugs. Some items may interact with your medicine. What should I watch for while using this  medication? Your condition will be monitored carefully while you are receiving this medication. You may need blood work done while you are receiving this medication. What side effects may I notice from receiving this medication? Side effects that you should report to your care team as soon as possible: Allergic reactions--skin rash, itching, hives, swelling of the face, lips, tongue, or throat High magnesium level--confusion, drowsiness, facial flushing, redness, sweating, muscle weakness, fast or irregular heartbeat, trouble breathing Low blood pressure--dizziness, feeling faint or lightheaded, blurry vision Side effects that usually do not require medical attention (report to your care team if they continue or are bothersome): Headache Nausea This list may not describe all possible side effects. Call your doctor for medical advice about side effects. You may report side effects to FDA at 1-800-FDA-1088. Where should I keep my medication? This medication is given in a hospital or clinic and will not be stored at home. NOTE: This sheet is a summary. It may not cover all possible information. If you have questions about this medicine, talk to your doctor, pharmacist, or health care provider.  2024 Elsevier/Gold Standard (2021-02-08 00:00:00)

## 2024-04-09 ENCOUNTER — Other Ambulatory Visit: Payer: Self-pay | Admitting: Physician Assistant

## 2024-04-09 ENCOUNTER — Inpatient Hospital Stay

## 2024-04-09 ENCOUNTER — Telehealth: Payer: Self-pay

## 2024-04-09 DIAGNOSIS — C7A090 Malignant carcinoid tumor of the bronchus and lung: Secondary | ICD-10-CM

## 2024-04-09 DIAGNOSIS — R110 Nausea: Secondary | ICD-10-CM | POA: Diagnosis not present

## 2024-04-09 DIAGNOSIS — E1122 Type 2 diabetes mellitus with diabetic chronic kidney disease: Secondary | ICD-10-CM | POA: Diagnosis not present

## 2024-04-09 DIAGNOSIS — I129 Hypertensive chronic kidney disease with stage 1 through stage 4 chronic kidney disease, or unspecified chronic kidney disease: Secondary | ICD-10-CM | POA: Diagnosis not present

## 2024-04-09 DIAGNOSIS — R197 Diarrhea, unspecified: Secondary | ICD-10-CM | POA: Diagnosis not present

## 2024-04-09 LAB — CMP (CANCER CENTER ONLY)
ALT: 17 U/L (ref 0–44)
AST: 18 U/L (ref 15–41)
Albumin: 4.1 g/dL (ref 3.5–5.0)
Alkaline Phosphatase: 108 U/L (ref 38–126)
Anion gap: 10 (ref 5–15)
BUN: 19 mg/dL (ref 8–23)
CO2: 25 mmol/L (ref 22–32)
Calcium: 9.3 mg/dL (ref 8.9–10.3)
Chloride: 102 mmol/L (ref 98–111)
Creatinine: 1.07 mg/dL — ABNORMAL HIGH (ref 0.44–1.00)
GFR, Estimated: 53 mL/min — ABNORMAL LOW (ref >60)
Glucose, Bld: 204 mg/dL — ABNORMAL HIGH (ref 70–99)
Potassium: 4.5 mmol/L (ref 3.5–5.1)
Sodium: 137 mmol/L (ref 135–145)
Total Bilirubin: 0.6 mg/dL (ref 0.0–1.2)
Total Protein: 7.2 g/dL (ref 6.5–8.1)

## 2024-04-09 LAB — CBC WITH DIFFERENTIAL (CANCER CENTER ONLY)
Abs Immature Granulocytes: 0.02 K/uL (ref 0.00–0.07)
Basophils Absolute: 0.1 K/uL (ref 0.0–0.1)
Basophils Relative: 1 %
Eosinophils Absolute: 0.3 K/uL (ref 0.0–0.5)
Eosinophils Relative: 4 %
HCT: 44.6 % (ref 36.0–46.0)
Hemoglobin: 15.3 g/dL — ABNORMAL HIGH (ref 12.0–15.0)
Immature Granulocytes: 0 %
Lymphocytes Relative: 28 %
Lymphs Abs: 2.2 K/uL (ref 0.7–4.0)
MCH: 31.2 pg (ref 26.0–34.0)
MCHC: 34.3 g/dL (ref 30.0–36.0)
MCV: 91 fL (ref 80.0–100.0)
Monocytes Absolute: 0.6 K/uL (ref 0.1–1.0)
Monocytes Relative: 7 %
Neutro Abs: 4.6 K/uL (ref 1.7–7.7)
Neutrophils Relative %: 60 %
Platelet Count: 297 K/uL (ref 150–400)
RBC: 4.9 MIL/uL (ref 3.87–5.11)
RDW: 12.8 % (ref 11.5–15.5)
WBC Count: 7.8 K/uL (ref 4.0–10.5)
nRBC: 0 % (ref 0.0–0.2)

## 2024-04-09 LAB — MAGNESIUM: Magnesium: 1 mg/dL — ABNORMAL LOW (ref 1.7–2.4)

## 2024-04-09 NOTE — Telephone Encounter (Signed)
 Spoke with patient's daughter, [name], regarding lab results. Per Cassie, PA, the patient's magnesium  level is low, and she would benefit from IV magnesium .  Informed Jill that the only available appointment for IV magnesium  is tomorrow at 0830. She voiced understanding and confirmed that the patient will be there.

## 2024-04-10 ENCOUNTER — Inpatient Hospital Stay

## 2024-04-10 DIAGNOSIS — E1122 Type 2 diabetes mellitus with diabetic chronic kidney disease: Secondary | ICD-10-CM | POA: Diagnosis not present

## 2024-04-10 DIAGNOSIS — I129 Hypertensive chronic kidney disease with stage 1 through stage 4 chronic kidney disease, or unspecified chronic kidney disease: Secondary | ICD-10-CM | POA: Diagnosis not present

## 2024-04-10 DIAGNOSIS — R197 Diarrhea, unspecified: Secondary | ICD-10-CM | POA: Diagnosis not present

## 2024-04-10 DIAGNOSIS — R110 Nausea: Secondary | ICD-10-CM | POA: Diagnosis not present

## 2024-04-10 DIAGNOSIS — C7A090 Malignant carcinoid tumor of the bronchus and lung: Secondary | ICD-10-CM | POA: Diagnosis not present

## 2024-04-10 MED ORDER — SODIUM CHLORIDE 0.9 % IV SOLN: INTRAVENOUS | Status: DC

## 2024-04-10 MED ORDER — MAGNESIUM SULFATE 4 GM/100ML IV SOLN
4.0000 g | Freq: Once | INTRAVENOUS | Status: AC
  Administered 2024-04-10: 4 g via INTRAVENOUS
  Filled 2024-04-10: qty 100

## 2024-04-10 NOTE — Patient Instructions (Signed)
 Hypomagnesemia Hypomagnesemia is a condition in which the level of magnesium  in the blood is too low. Magnesium  is a mineral that is found in many foods. It is used in many different processes in the body. Hypomagnesemia can affect every organ in the body. In severe cases, it can cause life-threatening problems. What are the causes? This condition may be caused by: Not getting enough magnesium  in your diet or not having enough healthy foods to eat (malnutrition). Problems with magnesium  absorption in the intestines. Dehydration. Excessive use of alcohol. Vomiting. Severe or long-term (chronic) diarrhea. Some medicines, including medicines that make you urinate more often (diuretics). Certain diseases, such as kidney disease, diabetes, celiac disease, and overactive thyroid . What are the signs or symptoms? Symptoms of this condition include: Loss of appetite, nausea, and vomiting. Involuntary shaking or trembling of a body part (tremor). Muscle weakness or tingling in the arms and legs. Sudden tightening of muscles (muscle spasms). Confusion. Psychiatric issues, such as: Depression and irritability. Psychosis. A feeling of fluttering of the heart (palpitations). Seizures. These symptoms are more severe if magnesium  levels drop suddenly. How is this diagnosed? This condition may be diagnosed based on: Your symptoms and medical history. A physical exam. Blood and urine tests. How is this treated? Treatment depends on the cause and the severity of the condition. It may be treated by: Taking a magnesium  supplement. This can be taken in pill form. If the condition is severe, magnesium  is usually given through an IV. Making changes to your diet. You may be directed to eat foods that have a lot of magnesium , such as green leafy vegetables, peas, beans, and nuts. Not drinking alcohol. If you are struggling not to drink, ask your health care provider for help. Follow these instructions at  home: Eating and drinking     Make sure that your diet includes foods with magnesium . Foods that have a lot of magnesium  in them include: Green leafy vegetables, such as spinach and broccoli. Beans and peas. Nuts and seeds, such as almonds and sunflower seeds. Whole grains, such as whole grain bread and fortified cereals. Drink fluids that contain salts and minerals (electrolytes), such as sports drinks, when you are active. Do not drink alcohol. General instructions Take over-the-counter and prescription medicines only as told by your health care provider. Take magnesium  supplements as directed if your health care provider tells you to take them. Have your magnesium  levels monitored as told by your health care provider. Keep all follow-up visits. This is important. Contact a health care provider if: You get worse instead of better. Your symptoms return. Get help right away if: You develop severe muscle weakness. You have trouble breathing. You feel that your heart is racing. These symptoms may represent a serious problem that is an emergency. Do not wait to see if the symptoms will go away. Get medical help right away. Call your local emergency services (911 in the [place] Do not drive yourself to the hospital. Summary Hypomagnesemia is a condition in which the level of magnesium  in the blood is too low. Hypomagnesemia can affect every organ in the body. Treatment may include eating more foods that contain magnesium , taking magnesium  supplements, and not drinking alcohol. Have your magnesium  levels monitored as told by your health care provider. This information is not intended to replace advice given to you by your health care provider. Make sure you discuss any questions you have with your health care provider. Document Revised: 10/25/2020 Document Reviewed: 10/25/2020 Elsevier Patient Education  2024 Elsevier Inc.

## 2024-04-11 DIAGNOSIS — F02B0 Dementia in other diseases classified elsewhere, moderate, without behavioral disturbance, psychotic disturbance, mood disturbance, and anxiety: Secondary | ICD-10-CM | POA: Diagnosis not present

## 2024-04-16 ENCOUNTER — Inpatient Hospital Stay: Attending: Internal Medicine | Admitting: Internal Medicine

## 2024-04-16 ENCOUNTER — Inpatient Hospital Stay

## 2024-04-16 VITALS — BP 143/100 | HR 70 | Temp 97.4°F | Resp 17 | Ht 61.0 in | Wt 177.0 lb

## 2024-04-16 DIAGNOSIS — Z86711 Personal history of pulmonary embolism: Secondary | ICD-10-CM | POA: Insufficient documentation

## 2024-04-16 DIAGNOSIS — C7A090 Malignant carcinoid tumor of the bronchus and lung: Secondary | ICD-10-CM

## 2024-04-16 DIAGNOSIS — Z79899 Other long term (current) drug therapy: Secondary | ICD-10-CM | POA: Diagnosis not present

## 2024-04-16 DIAGNOSIS — C3492 Malignant neoplasm of unspecified part of left bronchus or lung: Secondary | ICD-10-CM

## 2024-04-16 DIAGNOSIS — Z7901 Long term (current) use of anticoagulants: Secondary | ICD-10-CM | POA: Diagnosis not present

## 2024-04-16 DIAGNOSIS — Z7984 Long term (current) use of oral hypoglycemic drugs: Secondary | ICD-10-CM | POA: Insufficient documentation

## 2024-04-16 DIAGNOSIS — Z87442 Personal history of urinary calculi: Secondary | ICD-10-CM | POA: Diagnosis not present

## 2024-04-16 LAB — CMP (CANCER CENTER ONLY)
ALT: 17 U/L (ref 0–44)
AST: 18 U/L (ref 15–41)
Albumin: 4.2 g/dL (ref 3.5–5.0)
Alkaline Phosphatase: 91 U/L (ref 38–126)
Anion gap: 7 (ref 5–15)
BUN: 18 mg/dL (ref 8–23)
CO2: 26 mmol/L (ref 22–32)
Calcium: 9.8 mg/dL (ref 8.9–10.3)
Chloride: 103 mmol/L (ref 98–111)
Creatinine: 1.04 mg/dL — ABNORMAL HIGH (ref 0.44–1.00)
GFR, Estimated: 55 mL/min — ABNORMAL LOW (ref >60)
Glucose, Bld: 138 mg/dL — ABNORMAL HIGH (ref 70–99)
Potassium: 4.5 mmol/L (ref 3.5–5.1)
Sodium: 136 mmol/L (ref 135–145)
Total Bilirubin: 0.6 mg/dL (ref 0.0–1.2)
Total Protein: 7.4 g/dL (ref 6.5–8.1)

## 2024-04-16 LAB — CBC WITH DIFFERENTIAL (CANCER CENTER ONLY)
Abs Immature Granulocytes: 0.02 K/uL (ref 0.00–0.07)
Basophils Absolute: 0.1 K/uL (ref 0.0–0.1)
Basophils Relative: 1 %
Eosinophils Absolute: 0.3 K/uL (ref 0.0–0.5)
Eosinophils Relative: 4 %
HCT: 44.6 % (ref 36.0–46.0)
Hemoglobin: 15.4 g/dL — ABNORMAL HIGH (ref 12.0–15.0)
Immature Granulocytes: 0 %
Lymphocytes Relative: 25 %
Lymphs Abs: 2.2 K/uL (ref 0.7–4.0)
MCH: 31.6 pg (ref 26.0–34.0)
MCHC: 34.5 g/dL (ref 30.0–36.0)
MCV: 91.6 fL (ref 80.0–100.0)
Monocytes Absolute: 0.6 K/uL (ref 0.1–1.0)
Monocytes Relative: 7 %
Neutro Abs: 5.4 K/uL (ref 1.7–7.7)
Neutrophils Relative %: 63 %
Platelet Count: 268 K/uL (ref 150–400)
RBC: 4.87 MIL/uL (ref 3.87–5.11)
RDW: 12.9 % (ref 11.5–15.5)
WBC Count: 8.6 K/uL (ref 4.0–10.5)
nRBC: 0 % (ref 0.0–0.2)

## 2024-04-16 LAB — MAGNESIUM: Magnesium: 1.3 mg/dL — ABNORMAL LOW (ref 1.7–2.4)

## 2024-04-16 NOTE — Progress Notes (Signed)
 [facility] [phone]   [phone]  OFFICE PROGRESS NOTE  Chrystal Lamarr RAMAN, MD [address] [place] [address]  DIAGNOSIS:  1) Stage IV (T1b, N0, M1a) low-grade neuroendocrine carcinoma (carcinoid tumor), presented with innumerable bilateral pulmonary nodules diagnosed in March 2023. 2) incidental small subacute pulmonary embolus within a segmental pulmonary arterial branch of the right upper lobe on CT scan of the chest on September 09, 2023.  PRIOR THERAPY: None  CURRENT THERAPY:  1) treatment with Lanreotide 120 mg every 4 weeks status post 1 cycle. 2) Eliquis  5 mg p.o. twice daily  INTERVAL HISTORY: Michaela Santana 78 y.o. female returns to the clinic today for follow-up visit accompanied by her daughter.Discussed the use of AI scribe software for clinical note transcription with the patient, who gave verbal consent to proceed.  History of Present Illness Michaela Santana is a 78 year old female with stage four low grade neuroendocrine carcinoma who presents for evaluation before starting cycle number two of treatment. She is accompanied by her daughter, Jama.  She was diagnosed with stage four low grade neuroendocrine carcinoma with innumerable bilateral pulmonary nodules in March 2023. She is currently undergoing treatment with lanreotide, 120 mg every four weeks, and has completed one cycle. She feels good and has no current symptoms of diarrhea, nausea, vomiting, chest pain, or breathing issues.  In March 2023, an incidental finding of a small subacute pulmonary embolus was noted. There are no current symptoms related to this finding.  She reports a discrepancy in weight measurements, noting weight loss at home but not according to the clinic's scales. Her blood pressure was noted to be high during the visit, although she states it is normal at home and she took her medication this morning. She attributes the high reading to  nervousness.    MEDICAL HISTORY: Past Medical History:  Diagnosis Date   Allergic rhinitis    Anemia    Arthritis    probably in hands (07/17/2016)   CKD (chronic kidney disease), stage III (HCC)    Depression    Esophageal reflux    Family history of adverse reaction to anesthesia    her father had some issues when he had his gallbladder taken out when he was in his late 55s   GERD (gastroesophageal reflux disease)    Glaucoma    open angle, bilateral   High cholesterol    History of kidney stones    Hypercholesterolemia    Hypertension    Kidney cysts    Lumbago with sciatica, right side    Pulmonary embolism (HCC)    Sepsis (HCC) 07/2016   d/t flu   Type II diabetes mellitus (HCC)    Vitamin D deficiency     ALLERGIES:  is allergic to codeine and alprazolam .  MEDICATIONS:  Current Outpatient Medications  Medication Sig Dispense Refill   acetaminophen  (TYLENOL ) 650 MG CR tablet Take 650 mg by mouth every 8 (eight) hours as needed for pain.     albuterol  (VENTOLIN  HFA) 108 (90 Base) MCG/ACT inhaler Inhale into the lungs every 6 (six) hours as needed for wheezing or shortness of breath.     amLODipine  (NORVASC ) 5 MG tablet Take 5 mg by mouth daily.     apixaban  (ELIQUIS ) 5 MG TABS tablet Take 1 tablet (5 mg total) by mouth 2 (two) times daily. 60 tablet 3   Artificial Tear Solution (SYSTANE CONTACTS OP) Apply 0.4 drops to  eye as needed.     Blood Glucose Monitoring Suppl (FREESTYLE LITE) DEVI USE UTD (Patient taking differently: as needed.)  0   calcium  carbonate (TUMS - DOSED IN MG ELEMENTAL CALCIUM ) 500 MG chewable tablet Chew 1 tablet by mouth daily.     DULoxetine  HCl 40 MG CPEP Take 1 capsule by mouth daily.     ferrous sulfate 325 (65 FE) MG EC tablet Take 325 mg by mouth daily. (Patient taking differently: Take 325 mg by mouth 2 (two) times a week.)     FREESTYLE LITE test strip USE TO CHECK FASTING GLUCOSE IN THE MORNING AND 2 HOURS AFTER DINNER OR LUNCH  3    furosemide  (LASIX ) 20 MG tablet Take 1 tablet (20 mg total) by mouth as needed for fluid or edema (swelling). Pt needs to keep upcoming appt in Oct for further refills (Patient taking differently: Take 20 mg by mouth daily. Pt needs to keep upcoming appt in Oct for further refills) 90 tablet 0   gabapentin  (NEURONTIN ) 300 MG capsule Take 300 mg by mouth every evening.     glipiZIDE  (GLUCOTROL  XL) 5 MG 24 hr tablet Take 5 mg by mouth daily with breakfast.     Lancets (FREESTYLE) lancets      latanoprost  (XALATAN ) 0.005 % ophthalmic solution Place 1 drop into the right eye at bedtime.     loperamide  (IMODIUM ) 2 MG capsule Take 1 capsule (2 mg total) by mouth every 8 (eight) hours as needed for diarrhea or loose stools.     loratadine (CLARITIN) 10 MG tablet Take 10 mg by mouth daily as needed for allergies.     LORazepam  (ATIVAN ) 0.5 MG tablet Take 0.5 mg by mouth every 8 (eight) hours. (Patient taking differently: Take 0.5 mg by mouth at bedtime.)     losartan  (COZAAR ) 100 MG tablet Take 0.5 tablets (50 mg total) by mouth daily. 30 tablet 1   Magnesium  Citrate (MAGNESIUM  GUMMIES PO) Take 1,200 mg by mouth 2 (two) times daily.     ondansetron  (ZOFRAN ) 8 MG tablet Take 1 tablet (8 mg total) by mouth every 8 (eight) hours as needed. 30 tablet 1   oxymetazoline (AFRIN) 0.05 % nasal spray Place 1 spray into both nostrils 2 (two) times daily as needed for congestion.     pantoprazole  (PROTONIX ) 40 MG tablet Take 1 tablet (40 mg total) by mouth 2 (two) times daily before a meal. (Patient taking differently: Take 40 mg by mouth daily.) 120 tablet 3   potassium chloride  (KLOR-CON ) 20 MEQ packet Take 20 mEq by mouth daily.     QUEtiapine  (SEROQUEL ) 25 MG tablet Take 12.5 mg by mouth every other day.     vitamin B-12 (CYANOCOBALAMIN ) 1000 MCG tablet Take 1 tablet (1,000 mcg total) by mouth daily. 30 tablet 0   No current facility-administered medications for this visit.    SURGICAL HISTORY:  Past Surgical  History:  Procedure Laterality Date   BREAST BIOPSY  1990   ? side; it wasn't anything   BRONCHIAL BIOPSY  08/01/2021   Procedure: BRONCHIAL BIOPSIES;  Surgeon: Brenna Adine CROME, DO;  Location: [facility];  Service: Pulmonary;;   BRONCHIAL NEEDLE ASPIRATION BIOPSY  08/01/2021   Procedure: BRONCHIAL NEEDLE ASPIRATION BIOPSIES;  Surgeon: Brenna Adine CROME, DO;  Location: [facility];  Service: Pulmonary;;   CATARACT EXTRACTION, BILATERAL  2021   CHOLECYSTECTOMY OPEN  1971   COLONOSCOPY     2001,2011,2023   ESOPHAGOGASTRODUODENOSCOPY (EGD) WITH PROPOFOL  N/A 11/10/2022  Procedure: ESOPHAGOGASTRODUODENOSCOPY (EGD) WITH PROPOFOL ;  Surgeon: Burnette Fallow, MD;  Location: [facility] END[facility]COPY;  Service: Gastroenterology;  Laterality: N/A;   TUBAL LIGATION  1978   VIDEO BRONCHOSCOPY WITH RADIAL ENDOBRONCHIAL ULTRASOUND  08/01/2021   Procedure: RADIAL ENDOBRONCHIAL ULTRASOUND;  Surgeon: Brenna Adine CROME, DO;  Location: [facility];  Service: Pulmonary;;    REVIEW OF SYSTEMS:  A comprehensive review of systems was negative.   PHYSICAL EXAMINATION: General appearance: alert, cooperative, fatigued, and no distress Head: Normocephalic, without obvious abnormality, atraumatic Neck: no adenopathy, no JVD, supple, symmetrical, trachea midline, and thyroid  not enlarged, symmetric, no tenderness/mass/nodules Lymph nodes: Cervical, supraclavicular, and axillary nodes normal. Resp: clear to auscultation bilaterally Back: symmetric, no curvature. ROM normal. No CVA tenderness. Cardio: regular rate and rhythm, S1, S2 normal, no murmur, click, rub or gallop GI: soft, non-tender; bowel sounds normal; no masses,  no organomegaly Extremities: extremities normal, atraumatic, no cyanosis or edema  ECOG PERFORMANCE STATUS: 1 - Symptomatic but completely ambulatory  Blood pressure (!) 143/100, pulse 70, temperature (!) 97.4 F (36.3 C), temperature source Temporal, resp. rate 17, height 5' 1 (1.549 m), weight 177  lb (80.3 kg), SpO2 97%.  LABORATORY DATA: Lab Results  Component Value Date   WBC 8.6 04/16/2024   HGB 15.4 (H) 04/16/2024   HCT 44.6 04/16/2024   MCV 91.6 04/16/2024   PLT 268 04/16/2024      Chemistry      Component Value Date/Time   NA 137 04/09/2024 1105   K 4.5 04/09/2024 1105   CL 102 04/09/2024 1105   CO2 25 04/09/2024 1105   BUN 19 04/09/2024 1105   CREATININE 1.07 (H) 04/09/2024 1105      Component Value Date/Time   CALCIUM  9.3 04/09/2024 1105   ALKPHOS 108 04/09/2024 1105   AST 18 04/09/2024 1105   ALT 17 04/09/2024 1105   BILITOT 0.6 04/09/2024 1105       RADIOGRAPHIC STUDIES: No results found.   ASSESSMENT AND PLAN: This is a very pleasant 78 years old white female recently diagnosed with a stage IV (T1b, N0, M1 a) low-grade neuroendocrine carcinoma (carcinoid tumor) presented with innumerable bilateral pulmonary nodules diagnosed in March 2023. The recent CT-guided core biopsy of the left lung nodule was consistent with low-grade neuroendocrine carcinoma. 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 She is currently on treatment with Lanreotide 120 mg every 4 weeks status post 1 cycle. Assessment and Plan Assessment & Plan Metastatic low-grade neuroendocrine carcinoma with bilateral pulmonary metastases Stage IV low-grade neuroendocrine carcinoma with innumerable bilateral pulmonary nodules, diagnosed in March 2023. Currently on Lanreotide 120 mg every four weeks, post one cycle. No diarrhea, nausea, vomiting, chest pain, or breathing issues. Reports feeling better with more energy. - Continue Lanreotide 120 mg every four weeks as planned  Subacute pulmonary embolism Incidental finding of small subacute pulmonary embolism in March 2023. No current symptoms  reported.  Hypomagnesemia Potential source of magnesium  loss possibly related to kidney function. Referral to nephrology for further evaluation. - Referred to nephrology for evaluation of potential kidney-related magnesium  loss - Will monitor magnesium  levels and arrange for magnesium  supplementation if levels are low  Hypertension Blood pressure elevated during visit, but normal at home. She reports taking medication as prescribed. - Continue current antihypertensive regimen The patient was advised to call immediately if she has any concerning symptoms in the  interval.  The patient voices understanding of current disease status and treatment options and is in agreement with the current care plan.  All questions were answered. The patient knows to call the clinic with any problems, questions or concerns. We can certainly see the patient much sooner if necessary.  The total time spent in the appointment was 20 minutes.  Disclaimer: This note was dictated with voice recognition software. Similar sounding words can inadvertently be transcribed and may not be corrected upon review.

## 2024-04-17 ENCOUNTER — Telehealth: Payer: Self-pay

## 2024-04-17 NOTE — Telephone Encounter (Signed)
 Patient signed medical record release on 11/6 to be faxed back to [facility] consult service, Amelie Satchel, BSN RN.   Fax went through successfully.

## 2024-04-23 ENCOUNTER — Inpatient Hospital Stay

## 2024-04-23 VITALS — BP 167/96 | HR 63 | Temp 97.6°F | Resp 18

## 2024-04-23 DIAGNOSIS — C7A090 Malignant carcinoid tumor of the bronchus and lung: Secondary | ICD-10-CM | POA: Diagnosis not present

## 2024-04-23 DIAGNOSIS — Z87442 Personal history of urinary calculi: Secondary | ICD-10-CM | POA: Diagnosis not present

## 2024-04-23 DIAGNOSIS — Z79899 Other long term (current) drug therapy: Secondary | ICD-10-CM | POA: Diagnosis not present

## 2024-04-23 DIAGNOSIS — Z7901 Long term (current) use of anticoagulants: Secondary | ICD-10-CM | POA: Diagnosis not present

## 2024-04-23 DIAGNOSIS — Z86711 Personal history of pulmonary embolism: Secondary | ICD-10-CM | POA: Diagnosis not present

## 2024-04-23 DIAGNOSIS — C3492 Malignant neoplasm of unspecified part of left bronchus or lung: Secondary | ICD-10-CM

## 2024-04-23 DIAGNOSIS — Z7984 Long term (current) use of oral hypoglycemic drugs: Secondary | ICD-10-CM | POA: Diagnosis not present

## 2024-04-23 LAB — CMP (CANCER CENTER ONLY)
ALT: 18 U/L (ref 0–44)
AST: 21 U/L (ref 15–41)
Albumin: 4.2 g/dL (ref 3.5–5.0)
Alkaline Phosphatase: 87 U/L (ref 38–126)
Anion gap: 9 (ref 5–15)
BUN: 22 mg/dL (ref 8–23)
CO2: 27 mmol/L (ref 22–32)
Calcium: 10.1 mg/dL (ref 8.9–10.3)
Chloride: 102 mmol/L (ref 98–111)
Creatinine: 1.04 mg/dL — ABNORMAL HIGH (ref 0.44–1.00)
GFR, Estimated: 55 mL/min — ABNORMAL LOW (ref >60)
Glucose, Bld: 126 mg/dL — ABNORMAL HIGH (ref 70–99)
Potassium: 4.5 mmol/L (ref 3.5–5.1)
Sodium: 138 mmol/L (ref 135–145)
Total Bilirubin: 0.6 mg/dL (ref 0.0–1.2)
Total Protein: 7.3 g/dL (ref 6.5–8.1)

## 2024-04-23 LAB — CBC WITH DIFFERENTIAL (CANCER CENTER ONLY)
Abs Immature Granulocytes: 0.02 K/uL (ref 0.00–0.07)
Basophils Absolute: 0.1 K/uL (ref 0.0–0.1)
Basophils Relative: 1 %
Eosinophils Absolute: 0.3 K/uL (ref 0.0–0.5)
Eosinophils Relative: 3 %
HCT: 44.7 % (ref 36.0–46.0)
Hemoglobin: 15.5 g/dL — ABNORMAL HIGH (ref 12.0–15.0)
Immature Granulocytes: 0 %
Lymphocytes Relative: 28 %
Lymphs Abs: 2.4 K/uL (ref 0.7–4.0)
MCH: 31.5 pg (ref 26.0–34.0)
MCHC: 34.7 g/dL (ref 30.0–36.0)
MCV: 90.9 fL (ref 80.0–100.0)
Monocytes Absolute: 0.6 K/uL (ref 0.1–1.0)
Monocytes Relative: 7 %
Neutro Abs: 5.1 K/uL (ref 1.7–7.7)
Neutrophils Relative %: 61 %
Platelet Count: 288 K/uL (ref 150–400)
RBC: 4.92 MIL/uL (ref 3.87–5.11)
RDW: 13 % (ref 11.5–15.5)
WBC Count: 8.5 K/uL (ref 4.0–10.5)
nRBC: 0 % (ref 0.0–0.2)

## 2024-04-23 LAB — MAGNESIUM: Magnesium: 1.2 mg/dL — ABNORMAL LOW (ref 1.7–2.4)

## 2024-04-23 MED ORDER — LANREOTIDE ACETATE 120 MG/0.5ML ~~LOC~~ SOLN
120.0000 mg | Freq: Once | SUBCUTANEOUS | Status: AC
  Administered 2024-04-23: 120 mg via SUBCUTANEOUS
  Filled 2024-04-23: qty 0.5

## 2024-04-23 NOTE — Progress Notes (Signed)
 Patient here for Somatuline injection.  Blood pressure 144/102 and recheck 167/96.  States takes Norvasc  daily.  Denies any chest pian, dizziness or other symptoms. Advised her to follow up with her PCP.  Daughter states she has an appointment in mid December 2025.  Secure chatted Monica/RN with Dr. Nadara and Cassie/NP.  States ok to receive injection, but patient need to monitor her blood pressure readings and follow up with her PCP for adjustment on her medication.  Daughter and patient verbalized understanding

## 2024-04-27 ENCOUNTER — Encounter: Payer: Self-pay | Admitting: Internal Medicine

## 2024-04-28 DIAGNOSIS — I10 Essential (primary) hypertension: Secondary | ICD-10-CM | POA: Diagnosis not present

## 2024-04-28 DIAGNOSIS — C7A8 Other malignant neuroendocrine tumors: Secondary | ICD-10-CM | POA: Diagnosis not present

## 2024-05-05 DIAGNOSIS — I10 Essential (primary) hypertension: Secondary | ICD-10-CM | POA: Diagnosis not present

## 2024-05-11 DIAGNOSIS — F02B0 Dementia in other diseases classified elsewhere, moderate, without behavioral disturbance, psychotic disturbance, mood disturbance, and anxiety: Secondary | ICD-10-CM | POA: Diagnosis not present

## 2024-05-12 ENCOUNTER — Other Ambulatory Visit: Payer: Self-pay | Admitting: Cardiovascular Disease

## 2024-05-12 ENCOUNTER — Encounter: Payer: Self-pay | Admitting: Physician Assistant

## 2024-05-13 DIAGNOSIS — C7A8 Other malignant neuroendocrine tumors: Secondary | ICD-10-CM | POA: Diagnosis not present

## 2024-05-13 DIAGNOSIS — I129 Hypertensive chronic kidney disease with stage 1 through stage 4 chronic kidney disease, or unspecified chronic kidney disease: Secondary | ICD-10-CM | POA: Diagnosis not present

## 2024-05-13 DIAGNOSIS — N1831 Chronic kidney disease, stage 3a: Secondary | ICD-10-CM | POA: Diagnosis not present

## 2024-05-15 DIAGNOSIS — I10 Essential (primary) hypertension: Secondary | ICD-10-CM | POA: Diagnosis not present

## 2024-05-21 DIAGNOSIS — D49512 Neoplasm of unspecified behavior of left kidney: Secondary | ICD-10-CM | POA: Diagnosis not present

## 2024-05-23 NOTE — Progress Notes (Unsigned)
 [facility] OFFICE PROGRESS NOTE  Chrystal Lamarr RAMAN, MD [address] [place] [address]  DIAGNOSIS: 1) Stage IV (T1b, N0, M1a) low-grade neuroendocrine carcinoma (carcinoid tumor), presented with innumerable bilateral pulmonary nodules diagnosed in March 2023. 2) incidental small subacute pulmonary embolus within a segmental pulmonary arterial branch of the right upper lobe on CT scan of the chest on September 09, 2023.  PRIOR THERAPY: None  CURRENT THERAPY: 1) treatment with Lanreotide 120 mg every 4 weeks status post 2 cycles. 2) Eliquis  5 mg p.o. twice daily  INTERVAL HISTORY: Michaela Santana 78 y.o. female returns to the clinic today for follow-up visit.  The patient was last seen in clinic 1 month ago by Dr. Sherrod.  The patient has stage IV low-grade neuroendocrine carcinoma.  She is currently on treatment with Lanreotide monthly.  She tolerates this well overall.  The patient denies any major changes in her health since she was last seen.  She denies any fever, chills, night sweats, or unexplained weight loss.  She denies any chest pain, shortness of breath, cough, or hemoptysis.  Denies any nausea, vomiting, diarrhea, or constipation.  Denies any flushing, wheezing.  She denies any unusual pain.  She still is taking her Eliquis  for her history of small incidental PE which she tolerates well without any abnormal bleeding or bruising.  BP?  She is here today for evaluation and repeat blood work before undergoing her next injection     MEDICAL HISTORY: Past Medical History:  Diagnosis Date   Allergic rhinitis    Anemia    Arthritis    probably in hands (07/17/2016)   CKD (chronic kidney disease), stage III (HCC)    Depression    Esophageal reflux    Family history of adverse reaction to anesthesia    her father had some issues when he had his gallbladder taken out when he was in his late 69s   GERD (gastroesophageal reflux disease)    Glaucoma     open angle, bilateral   High cholesterol    History of kidney stones    Hypercholesterolemia    Hypertension    Kidney cysts    Lumbago with sciatica, right side    Pulmonary embolism (HCC)    Sepsis (HCC) 07/2016   d/t flu   Type II diabetes mellitus (HCC)    Vitamin D deficiency     ALLERGIES:  is allergic to codeine and alprazolam .  MEDICATIONS:  Current Outpatient Medications  Medication Sig Dispense Refill   acetaminophen  (TYLENOL ) 650 MG CR tablet Take 650 mg by mouth every 8 (eight) hours as needed for pain.     albuterol  (VENTOLIN  HFA) 108 (90 Base) MCG/ACT inhaler Inhale into the lungs every 6 (six) hours as needed for wheezing or shortness of breath.     amLODipine  (NORVASC ) 5 MG tablet Take 5 mg by mouth daily.     apixaban  (ELIQUIS ) 5 MG TABS tablet Take 1 tablet (5 mg total) by mouth 2 (two) times daily. 60 tablet 3   Artificial Tear Solution (SYSTANE CONTACTS OP) Apply 0.4 drops to eye as needed.     Blood Glucose Monitoring Suppl (FREESTYLE LITE) DEVI USE UTD (Patient taking differently: as needed.)  0   calcium  carbonate (TUMS - DOSED IN MG ELEMENTAL CALCIUM ) 500 MG chewable tablet Chew 1 tablet by mouth daily.     DULoxetine  HCl 40 MG CPEP Take 1 capsule by mouth daily.     ferrous sulfate 325 (  65 FE) MG EC tablet Take 325 mg by mouth daily. (Patient taking differently: Take 325 mg by mouth 2 (two) times a week.)     FREESTYLE LITE test strip USE TO CHECK FASTING GLUCOSE IN THE MORNING AND 2 HOURS AFTER DINNER OR LUNCH  3   furosemide  (LASIX ) 20 MG tablet Take 1 tablet (20 mg total) by mouth as needed for fluid or edema (swelling). lls 90 tablet 3   gabapentin  (NEURONTIN ) 300 MG capsule Take 300 mg by mouth every evening.     glipiZIDE  (GLUCOTROL  XL) 5 MG 24 hr tablet Take 5 mg by mouth daily with breakfast.     Lancets (FREESTYLE) lancets      latanoprost  (XALATAN ) 0.005 % ophthalmic solution Place 1 drop into the right eye at bedtime.     loperamide  (IMODIUM ) 2  MG capsule Take 1 capsule (2 mg total) by mouth every 8 (eight) hours as needed for diarrhea or loose stools.     loratadine (CLARITIN) 10 MG tablet Take 10 mg by mouth daily as needed for allergies.     LORazepam  (ATIVAN ) 0.5 MG tablet Take 0.5 mg by mouth every 8 (eight) hours. (Patient taking differently: Take 0.5 mg by mouth at bedtime.)     losartan  (COZAAR ) 100 MG tablet Take 0.5 tablets (50 mg total) by mouth daily. 30 tablet 1   Magnesium  Citrate (MAGNESIUM  GUMMIES PO) Take 1,200 mg by mouth 2 (two) times daily.     ondansetron  (ZOFRAN ) 8 MG tablet Take 1 tablet (8 mg total) by mouth every 8 (eight) hours as needed. 30 tablet 1   oxymetazoline  (AFRIN) 0.05 % nasal spray Place 1 spray into both nostrils 2 (two) times daily as needed for congestion.     pantoprazole  (PROTONIX ) 40 MG tablet Take 1 tablet (40 mg total) by mouth 2 (two) times daily before a meal. (Patient taking differently: Take 40 mg by mouth daily.) 120 tablet 3   potassium chloride  (KLOR-CON ) 20 MEQ packet Take 20 mEq by mouth daily.     QUEtiapine  (SEROQUEL ) 25 MG tablet Take 12.5 mg by mouth every other day.     vitamin B-12 (CYANOCOBALAMIN ) 1000 MCG tablet Take 1 tablet (1,000 mcg total) by mouth daily. 30 tablet 0   No current facility-administered medications for this visit.    SURGICAL HISTORY:  Past Surgical History:  Procedure Laterality Date   BREAST BIOPSY  1990   ? side; it wasn't anything   BRONCHIAL BIOPSY  08/01/2021   Procedure: BRONCHIAL BIOPSIES;  Surgeon: Brenna Adine CROME, DO;  Location: [facility];  Service: Pulmonary;;   BRONCHIAL NEEDLE ASPIRATION BIOPSY  08/01/2021   Procedure: BRONCHIAL NEEDLE ASPIRATION BIOPSIES;  Surgeon: Brenna Adine CROME, DO;  Location: [facility];  Service: Pulmonary;;   CATARACT EXTRACTION, BILATERAL  2021   CHOLECYSTECTOMY OPEN  1971   COLONOSCOPY     2001,2011,2023   ESOPHAGOGASTRODUODENOSCOPY (EGD) WITH PROPOFOL  N/A 11/10/2022   Procedure:  ESOPHAGOGASTRODUODENOSCOPY (EGD) WITH PROPOFOL ;  Surgeon: Burnette Fallow, MD;  Location: [facility] ENDOSCOPY;  Service: Gastroenterology;  Laterality: N/A;   TUBAL LIGATION  1978   VIDEO BRONCHOSCOPY WITH RADIAL ENDOBRONCHIAL ULTRASOUND  08/01/2021   Procedure: RADIAL ENDOBRONCHIAL ULTRASOUND;  Surgeon: Brenna Adine CROME, DO;  Location: [facility];  Service: Pulmonary;;    REVIEW OF SYSTEMS:   Review of Systems  Constitutional: Negative for appetite change, chills, fatigue, fever and unexpected weight change.  HENT:   Negative for mouth sores, nosebleeds, sore throat and trouble swallowing.  Eyes: Negative for eye problems and icterus.  Respiratory: Negative for cough, hemoptysis, shortness of breath and wheezing.   Cardiovascular: Negative for chest pain and leg swelling.  Gastrointestinal: Negative for abdominal pain, constipation, diarrhea, nausea and vomiting.  Genitourinary: Negative for bladder incontinence, difficulty urinating, dysuria, frequency and hematuria.   Musculoskeletal: Negative for back pain, gait problem, neck pain and neck stiffness.  Skin: Negative for itching and rash.  Neurological: Negative for dizziness, extremity weakness, gait problem, headaches, light-headedness and seizures.  Hematological: Negative for adenopathy. Does not bruise/bleed easily.  Psychiatric/Behavioral: Negative for confusion, depression and sleep disturbance. The patient is not nervous/anxious.     PHYSICAL EXAMINATION:  There were no vitals taken for this visit.  ECOG PERFORMANCE STATUS: {CHL ONC ECOG [id number]  Physical Exam  Constitutional: Oriented to person, place, and time and well-developed, well-nourished, and in no distress. No distress.  HENT:  Head: Normocephalic and atraumatic.  Mouth/Throat: Oropharynx is clear and moist. No oropharyngeal exudate.  Eyes: Conjunctivae are normal. Right eye exhibits no discharge. Left eye exhibits no discharge. No scleral icterus.  Neck:  Normal range of motion. Neck supple.  Cardiovascular: Normal rate, regular rhythm, normal heart sounds and intact distal pulses.   Pulmonary/Chest: Effort normal and breath sounds normal. No respiratory distress. No wheezes. No rales.  Abdominal: Soft. Bowel sounds are normal. Exhibits no distension and no mass. There is no tenderness.  Musculoskeletal: Normal range of motion. Exhibits no edema.  Lymphadenopathy:    No cervical adenopathy.  Neurological: Alert and oriented to person, place, and time. Exhibits normal muscle tone. Gait normal. Coordination normal.  Skin: Skin is warm and dry. No rash noted. Not diaphoretic. No erythema. No pallor.  Psychiatric: Mood, memory and judgment normal.  Vitals reviewed.  LABORATORY DATA: Lab Results  Component Value Date   WBC 8.5 04/23/2024   HGB 15.5 (H) 04/23/2024   HCT 44.7 04/23/2024   MCV 90.9 04/23/2024   PLT 288 04/23/2024      Chemistry      Component Value Date/Time   NA 138 04/23/2024 1508   K 4.5 04/23/2024 1508   CL 102 04/23/2024 1508   CO2 27 04/23/2024 1508   BUN 22 04/23/2024 1508   CREATININE 1.04 (H) 04/23/2024 1508      Component Value Date/Time   CALCIUM  10.1 04/23/2024 1508   ALKPHOS 87 04/23/2024 1508   AST 21 04/23/2024 1508   ALT 18 04/23/2024 1508   BILITOT 0.6 04/23/2024 1508       RADIOGRAPHIC STUDIES:  No results found.   ASSESSMENT/PLAN:  This is a very pleasant 78 year old Caucasian female diagnosed with stage IV (T1b, N0, M1 A) low-grade neuroendocrine carcinoma (carcinoid tumor).  She presented with innumerable bilateral pulmonary nodules which were diagnosed in March 2023.  A CT-guided core biopsy of the left lung nodule was consistent with a low-grade neuroendocrine carcinoma.  She more recently was having symptoms such as diarrhea, hypomagnesemia, hypophosphatemia, hypokalemia, hypocalcemia which could be secondary to her persistent diarrhea in the setting of carcinoid tumor.  She is  currently on injections with Lanreotide monthly.  The patient was seen with Dr. Sherrod today.  Labs were reviewed.  Recommend that she ***her next injection as scheduled.  We will see her back in 4 weeks before undergoing her fourth injection.  Blood pressure?  Magnesium  and potassium?  Hypertension?  She will continue taking Eliquis  for history of small incidental PE in March 2023.  Scan?  The patient was advised to  call immediately if she has any concerning symptoms in the interval. The patient voices understanding of current disease status and treatment options and is in agreement with the current care plan. All questions were answered. The patient knows to call the clinic with any problems, questions or concerns. We can certainly see the patient much sooner if necessary   No orders of the defined types were placed in this encounter.    I spent {CHL ONC TIME VISIT - DTPQU:[id number]} counseling the patient face to face. The total time spent in the appointment was {CHL ONC TIME VISIT - DTPQU:[id number]}.  Sherryl Valido L Xana Bradt, PA-C 05/23/2024

## 2024-05-26 ENCOUNTER — Inpatient Hospital Stay: Attending: Internal Medicine

## 2024-05-26 ENCOUNTER — Other Ambulatory Visit: Payer: Self-pay | Admitting: Physician Assistant

## 2024-05-26 ENCOUNTER — Inpatient Hospital Stay: Admitting: Physician Assistant

## 2024-05-26 ENCOUNTER — Inpatient Hospital Stay

## 2024-05-26 VITALS — BP 138/68 | HR 84 | Temp 97.6°F | Wt 176.4 lb

## 2024-05-26 DIAGNOSIS — C7A090 Malignant carcinoid tumor of the bronchus and lung: Secondary | ICD-10-CM

## 2024-05-26 DIAGNOSIS — C3492 Malignant neoplasm of unspecified part of left bronchus or lung: Secondary | ICD-10-CM

## 2024-05-26 LAB — CMP (CANCER CENTER ONLY)
ALT: 23 U/L (ref 0–44)
AST: 22 U/L (ref 15–41)
Albumin: 4.5 g/dL (ref 3.5–5.0)
Alkaline Phosphatase: 95 U/L (ref 38–126)
Anion gap: 13 (ref 5–15)
BUN: 25 mg/dL — ABNORMAL HIGH (ref 8–23)
CO2: 21 mmol/L — ABNORMAL LOW (ref 22–32)
Calcium: 10.2 mg/dL (ref 8.9–10.3)
Chloride: 102 mmol/L (ref 98–111)
Creatinine: 1.13 mg/dL — ABNORMAL HIGH (ref 0.44–1.00)
GFR, Estimated: 50 mL/min — ABNORMAL LOW (ref >60)
Glucose, Bld: 224 mg/dL — ABNORMAL HIGH (ref 70–99)
Potassium: 4.9 mmol/L (ref 3.5–5.1)
Sodium: 136 mmol/L (ref 135–145)
Total Bilirubin: 0.5 mg/dL (ref 0.0–1.2)
Total Protein: 7.5 g/dL (ref 6.5–8.1)

## 2024-05-26 LAB — CBC WITH DIFFERENTIAL (CANCER CENTER ONLY)
Abs Immature Granulocytes: 0.03 K/uL (ref 0.00–0.07)
Basophils Absolute: 0.1 K/uL (ref 0.0–0.1)
Basophils Relative: 1 %
Eosinophils Absolute: 0.4 K/uL (ref 0.0–0.5)
Eosinophils Relative: 5 %
HCT: 44.8 % (ref 36.0–46.0)
Hemoglobin: 15.2 g/dL — ABNORMAL HIGH (ref 12.0–15.0)
Immature Granulocytes: 0 %
Lymphocytes Relative: 25 %
Lymphs Abs: 2.3 K/uL (ref 0.7–4.0)
MCH: 31.4 pg (ref 26.0–34.0)
MCHC: 33.9 g/dL (ref 30.0–36.0)
MCV: 92.6 fL (ref 80.0–100.0)
Monocytes Absolute: 0.6 K/uL (ref 0.1–1.0)
Monocytes Relative: 7 %
Neutro Abs: 5.6 K/uL (ref 1.7–7.7)
Neutrophils Relative %: 62 %
Platelet Count: 326 K/uL (ref 150–400)
RBC: 4.84 MIL/uL (ref 3.87–5.11)
RDW: 13.1 % (ref 11.5–15.5)
WBC Count: 9 K/uL (ref 4.0–10.5)
nRBC: 0 % (ref 0.0–0.2)

## 2024-05-26 LAB — MAGNESIUM: Magnesium: 1.7 mg/dL (ref 1.7–2.4)

## 2024-05-26 MED ORDER — LANREOTIDE ACETATE 120 MG/0.5ML ~~LOC~~ SOLN
120.0000 mg | Freq: Once | SUBCUTANEOUS | Status: AC
  Administered 2024-05-26: 11:00:00 120 mg via SUBCUTANEOUS
  Filled 2024-05-26: qty 0.5

## 2024-05-26 NOTE — Progress Notes (Signed)
 Patient waited in lobby for lab results.  Per Cassie, PA, patients magnesium  is WNL.  Informed patient to continue taking magnesium  supplements twice daily. Informed patient to continue hydrating well. She voiced understanding.

## 2024-05-28 DIAGNOSIS — E1122 Type 2 diabetes mellitus with diabetic chronic kidney disease: Secondary | ICD-10-CM | POA: Diagnosis not present

## 2024-06-06 ENCOUNTER — Ambulatory Visit (HOSPITAL_BASED_OUTPATIENT_CLINIC_OR_DEPARTMENT_OTHER)
Admission: RE | Admit: 2024-06-06 | Discharge: 2024-06-06 | Disposition: A | Discharge status: Home / Self Care | Source: Ambulatory Visit | Attending: Physician Assistant | Admitting: Physician Assistant

## 2024-06-06 DIAGNOSIS — C7A090 Malignant carcinoid tumor of the bronchus and lung: Secondary | ICD-10-CM | POA: Diagnosis present

## 2024-06-06 LAB — POCT I-STAT CREATININE: Creatinine, Ser: 1.1 mg/dL — ABNORMAL HIGH (ref 0.44–1.00)

## 2024-06-06 MED ORDER — IOHEXOL 300 MG/ML  SOLN
75.0000 mL | Freq: Once | INTRAMUSCULAR | Status: AC | PRN
  Administered 2024-06-06: 75 mL via INTRAVENOUS

## 2024-06-19 ENCOUNTER — Encounter: Payer: Self-pay | Admitting: Internal Medicine

## 2024-06-19 NOTE — Progress Notes (Unsigned)
 [facility] OFFICE PROGRESS NOTE  Michaela Lamarr RAMAN, MD [address] [place] [address]  DIAGNOSIS:  1) Stage IV (T1b, N0, M1a) low-grade neuroendocrine carcinoma (carcinoid tumor), presented with innumerable bilateral pulmonary nodules diagnosed in March 2023. 2) incidental small subacute pulmonary embolus within a segmental pulmonary arterial branch of the right upper lobe on CT scan of the chest on September 09, 2023.  PRIOR THERAPY: None  CURRENT THERAPY:  1) treatment with Lanreotide 120 mg every 4 weeks status post 3 cycles. 2) Eliquis  5 mg p.o. twice daily  INTERVAL HISTORY: Michaela Santana 79 y.o. female returns to the clinic today for a follow-up visit.  The patient was last seen in clinic 1 month ago by myself.   The patient has stage IV low-grade neuroendocrine carcinoma.  She is currently on treatment with Lanreotide monthly. She is accompanied by her caregiver Michaela Santana today.  He has had some intermittent increased confusion and hallucinations over the last few days and altered sleep patterns.  Of note she does have dementia and has not seen a neurologist in several years.  She does take Seroquel .  Denies any recent new medications or discontinuing any medications.  She had a analysis performed at her PCPs office last week.  I do not have record of this but reportedly there was trace amounts of an abnormal finding that was potentially suggestive of a urinary tract infection.  Fosfomycin was sent to the pharmacy but the pharmacy did not carry this so they were not able to pick it up yet.  The patient denies any other signs and symptoms of infection including fever, sore throat, nasal congestion, cough, shortness of breath, skin infections, or unusual diarrhea.  They are wondering if the patient would be able to receive additional IV fluids today.  She does not have heart failure.  She is a hard IV stick and was stuck 7 times in the lab today.  She  typically has electrolyte abnormalities and does not like the taste of electrolyte drinks including Gatorade, Pedialyte, liquid IV, etc.  She does drink water.  He is taking her magnesium  supplement at home and potassium.  She also consumes egg whites and spinach and kale in the mornings.   She has a bruise over her right eye.  She tripped over a mat by her bed last week and hit her face on the nightstand.  She is reportedly not eating or drinking as much since last week on Friday.  She recently had a restaging CT scan.  Her wheezing is reportedly better.  She is here for evaluation and repeat blood work before undergoing her next injection.   MEDICAL HISTORY: Past Medical History:  Diagnosis Date   Allergic rhinitis    Anemia    Arthritis    probably in hands (07/17/2016)   CKD (chronic kidney disease), stage III (HCC)    Depression    Esophageal reflux    Family history of adverse reaction to anesthesia    her father had some issues when he had his gallbladder taken out when he was in his late 74s   GERD (gastroesophageal reflux disease)    Glaucoma    open angle, bilateral   High cholesterol    History of kidney stones    Hypercholesterolemia    Hypertension    Kidney cysts    Lumbago with sciatica, right side    Pulmonary embolism (HCC)    Sepsis (HCC) 07/2016   d/t flu  Type II diabetes mellitus (HCC)    Vitamin D deficiency     ALLERGIES:  is allergic to codeine and alprazolam .  MEDICATIONS:  Current Outpatient Medications  Medication Sig Dispense Refill   acetaminophen  (TYLENOL ) 650 MG CR tablet Take 650 mg by mouth every 8 (eight) hours as needed for pain.     albuterol  (VENTOLIN  HFA) 108 (90 Base) MCG/ACT inhaler Inhale into the lungs every 6 (six) hours as needed for wheezing or shortness of breath.     amLODipine  (NORVASC ) 5 MG tablet Take 5 mg by mouth daily.     apixaban  (ELIQUIS ) 5 MG TABS tablet Take 1 tablet (5 mg total) by mouth 2 (two) times daily.  60 tablet 3   Artificial Tear Solution (SYSTANE CONTACTS OP) Apply 0.4 drops to eye as needed.     Blood Glucose Monitoring Suppl (FREESTYLE LITE) DEVI USE UTD (Patient taking differently: as needed.)  0   calcium  carbonate (TUMS - DOSED IN MG ELEMENTAL CALCIUM ) 500 MG chewable tablet Chew 1 tablet by mouth daily.     DULoxetine  HCl 40 MG CPEP Take 1 capsule by mouth daily.     ferrous sulfate 325 (65 FE) MG EC tablet Take 325 mg by mouth daily. (Patient taking differently: Take 325 mg by mouth 2 (two) times a week.)     FREESTYLE LITE test strip USE TO CHECK FASTING GLUCOSE IN THE MORNING AND 2 HOURS AFTER DINNER OR LUNCH  3   furosemide  (LASIX ) 20 MG tablet Take 1 tablet (20 mg total) by mouth as needed for fluid or edema (swelling). lls 90 tablet 3   gabapentin  (NEURONTIN ) 300 MG capsule Take 300 mg by mouth every evening.     glipiZIDE  (GLUCOTROL  XL) 5 MG 24 hr tablet Take 5 mg by mouth daily with breakfast.     Lancets (FREESTYLE) lancets      latanoprost  (XALATAN ) 0.005 % ophthalmic solution Place 1 drop into the right eye at bedtime.     loperamide  (IMODIUM ) 2 MG capsule Take 1 capsule (2 mg total) by mouth every 8 (eight) hours as needed for diarrhea or loose stools.     loratadine (CLARITIN) 10 MG tablet Take 10 mg by mouth daily as needed for allergies.     LORazepam  (ATIVAN ) 0.5 MG tablet Take 0.5 mg by mouth every 8 (eight) hours. (Patient taking differently: Take 0.5 mg by mouth at bedtime.)     losartan  (COZAAR ) 100 MG tablet Take 0.5 tablets (50 mg total) by mouth daily. 30 tablet 1   Magnesium  Citrate (MAGNESIUM  GUMMIES PO) Take 1,200 mg by mouth 2 (two) times daily.     ondansetron  (ZOFRAN ) 8 MG tablet Take 1 tablet (8 mg total) by mouth every 8 (eight) hours as needed. 30 tablet 1   oxymetazoline  (AFRIN) 0.05 % nasal spray Place 1 spray into both nostrils 2 (two) times daily as needed for congestion.     pantoprazole  (PROTONIX ) 40 MG tablet Take 1 tablet (40 mg total) by mouth 2  (two) times daily before a meal. (Patient taking differently: Take 40 mg by mouth daily.) 120 tablet 3   potassium chloride  (KLOR-CON ) 20 MEQ packet Take 20 mEq by mouth daily.     QUEtiapine  (SEROQUEL ) 25 MG tablet Take 12.5 mg by mouth every other day.     vitamin B-12 (CYANOCOBALAMIN ) 1000 MCG tablet Take 1 tablet (1,000 mcg total) by mouth daily. 30 tablet 0   No current facility-administered medications for this visit.   Facility-Administered  Medications Ordered in Other Visits  Medication Dose Route Frequency Provider Last Rate Last Admin   0.9 %  sodium chloride  infusion   Intravenous Once Michaela Mccutcheon L, PA-C       lanreotide acetate  (SOMATULINE DEPOT ) injection 120 mg  120 mg Subcutaneous Once Michaela Sherrod, MD        SURGICAL HISTORY:  Past Surgical History:  Procedure Laterality Date   BREAST BIOPSY  1990   ? side; it wasn't anything   BRONCHIAL BIOPSY  08/01/2021   Procedure: BRONCHIAL BIOPSIES;  Surgeon: Brenna Adine CROME, DO;  Location: [facility] ENDOSCOPY;  Service: Pulmonary;;   BRONCHIAL NEEDLE ASPIRATION BIOPSY  08/01/2021   Procedure: BRONCHIAL NEEDLE ASPIRATION BIOPSIES;  Surgeon: Brenna Adine CROME, DO;  Location: [facility] ENDOSCOPY;  Service: Pulmonary;;   CATARACT EXTRACTION, BILATERAL  2021   CHOLECYSTECTOMY OPEN  1971   COLONOSCOPY     2001,2011,2023   ESOPHAGOGASTRODUODENOSCOPY (EGD) WITH PROPOFOL  N/A 11/10/2022   Procedure: ESOPHAGOGASTRODUODENOSCOPY (EGD) WITH PROPOFOL ;  Surgeon: Burnette Fallow, MD;  Location: [facility] ENDOSCOPY;  Service: Gastroenterology;  Laterality: N/A;   TUBAL LIGATION  1978   VIDEO BRONCHOSCOPY WITH RADIAL ENDOBRONCHIAL ULTRASOUND  08/01/2021   Procedure: RADIAL ENDOBRONCHIAL ULTRASOUND;  Surgeon: Brenna Adine CROME, DO;  Location: [facility]OSCOPY;  Service: Pulmonary;;    REVIEW OF SYSTEMS:   Review of Systems  Constitutional: Positive for fatigue and decreased appetite/weight loss. Negative for chills and fever.  HENT: Negative for mouth  sores, nosebleeds, sore throat and trouble swallowing.   Eyes: Positive for bruise over right eye. negative for eye problems and icterus.  Respiratory: Negative for cough, hemoptysis, shortness of breath and wheezing.   Cardiovascular: Negative for chest pain and leg swelling.  Gastrointestinal: Negative for abdominal pain, constipation, diarrhea, nausea and vomiting.  Genitourinary: Negative for bladder incontinence, difficulty urinating, dysuria, frequency and hematuria.   Musculoskeletal: Negative for back pain, gait problem, neck pain and neck stiffness.  Skin: Negative for itching and rash.  Neurological: Negative for dizziness, extremity weakness, gait problem, headaches, light-headedness and seizures.  Hematological: Negative for adenopathy. Does not bruise/bleed easily.  Psychiatric/Behavioral: Positive for intermittent confusion and hallucinations.  Negative for  depression. The patient is not nervous/anxious.     PHYSICAL EXAMINATION:  Blood pressure 130/63, pulse (!) 56, temperature 97.6 F (36.4 C), temperature source Oral, resp. rate 17, height 5' 1 (1.549 m), weight 174 lb 9.6 oz (79.2 kg), SpO2 100%.  ECOG PERFORMANCE STATUS: 2  Physical Exam  Constitutional: Oriented to person, place, and time and well-developed, well-nourished, and in no distress.  HENT:  Head: Normocephalic and atraumatic. Hard of hearing.  Mouth/Throat: Oropharynx is clear and moist. No oropharyngeal exudate.  Eyes: Bruise over right eye. Conjunctivae are normal. Right eye exhibits no discharge. Left eye exhibits no discharge. No scleral icterus.  Neck: Normal range of motion. Neck supple.  Cardiovascular: Normal rate, regular rhythm, normal heart sounds and intact distal pulses.   Pulmonary/Chest: Effort normal and breath sounds normal. No respiratory distress. No wheezes. No rales.  Abdominal: Soft. Bowel sounds are normal. Exhibits no distension and no mass. There is no tenderness.   Musculoskeletal: Normal range of motion. Exhibits no edema.  Lymphadenopathy:    No cervical adenopathy.  Neurological: Alert and oriented to person, place, and time. Exhibits muscle wasting.  Ambulates with a cane Skin: Skin is warm and dry. No rash noted. Not diaphoretic. No erythema. No pallor.  Psychiatric: Mood, memory and judgment normal.  Vitals reviewed.  LABORATORY DATA: Lab  Results  Component Value Date   WBC 9.0 05/26/2024   HGB 15.2 (H) 05/26/2024   HCT 44.8 05/26/2024   MCV 92.6 05/26/2024   PLT 326 05/26/2024      Chemistry      Component Value Date/Time   NA 136 05/26/2024 1022   K 4.9 05/26/2024 1022   CL 102 05/26/2024 1022   CO2 21 (Santana) 05/26/2024 1022   BUN 25 (H) 05/26/2024 1022   CREATININE 1.10 (H) 06/06/2024 1051   CREATININE 1.13 (H) 05/26/2024 1022      Component Value Date/Time   CALCIUM  10.2 05/26/2024 1022   ALKPHOS 95 05/26/2024 1022   AST 22 05/26/2024 1022   ALT 23 05/26/2024 1022   BILITOT 0.5 05/26/2024 1022       RADIOGRAPHIC STUDIES:  CT Chest W Contrast Result Date: 06/14/2024 CLINICAL DATA:  Neuroendocrine tumor.  * Tracking Code: BO * EXAM: CT CHEST WITH CONTRAST TECHNIQUE: Multidetector CT imaging of the chest was performed during intravenous contrast administration. RADIATION DOSE REDUCTION: This exam was performed according to the departmental dose-optimization program which includes automated exposure control, adjustment of the mA and/or kV according to patient size and/or use of iterative reconstruction technique. CONTRAST:  75mL OMNIPAQUE  IOHEXOL  300 MG/ML  SOLN COMPARISON:  CT chest dated 03/06/2024 FINDINGS: Cardiovascular: Normal heart size. No significant pericardial fluid/thickening. Great vessels are normal in course and caliber. No central pulmonary emboli. Coronary artery calcifications and aortic atherosclerosis. Mediastinum/Nodes: Imaged thyroid  gland without nodules meeting criteria for imaging follow-up by size.  Moderate hiatal hernia. No pathologically enlarged axillary, supraclavicular, mediastinal, or hilar lymph nodes. Lungs/Pleura: The central airways are patent. Innumerable bilateral pulmonary nodules are not substantially changed. Index left upper lobe nodule measures 1.2 x 1.0 cm (3:53) and left lower lobe nodule measuring 1.2 x 1.0 cm (3:52) and 1.1 x 1.0 cm (3:92). No focal consolidation. No pneumothorax. No pleural effusion. Upper abdomen: Normal. Musculoskeletal: No acute or abnormal lytic or blastic osseous lesions. Multilevel degenerative changes of the thoracic spine. IMPRESSION: 1. No substantial change in innumerable bilateral pulmonary nodules. No new metastatic disease. 2. Moderate hiatal hernia. 3.  Aortic Atherosclerosis (ICD10-I70.0). Electronically Signed   By: Limin  Xu M.D.   On: 06/14/2024 15:11     ASSESSMENT/PLAN:  This is a very pleasant 79 year old Caucasian female diagnosed with stage IV (T1b, N0, M1 A) low-grade neuroendocrine carcinoma (carcinoid tumor).  She presented with innumerable bilateral pulmonary nodules which were diagnosed in March 2023.   A CT-guided core biopsy of the left lung nodule was consistent with a low-grade neuroendocrine carcinoma.   She more recently was having symptoms such as diarrhea, hypomagnesemia, hypophosphatemia, hypokalemia, hypocalcemia which could be secondary to her persistent diarrhea in the setting of carcinoid tumor.   She is currently on injections with Lanreotide monthly.  The patient was seen with Dr. Sherrod today.  Dr. Sherrod personally and independently reviewed the scan and discussed results with the patient today.  The scan showed no evidence of disease progression.  Dr. Sherrod recommends to continue on the same treatment at the same dose.  She is a difficult IV stick and was stuck multiple times in the lab today.  Unable to get her blood work today.  We will call IV team.  We will start with 1 Santana of IV fluids over 2 hours due to  her poor oral intake and could add electrolytes based on her CMP and magnesium  results.  She will continue taking her magnesium  at home.  She will continue and receive her lanreotide injection today  She will take the antibiotics prescribed by her PCP for possible urinary tract infection.  They will also contact her PCP to see if she needs to see neurology.   She completed her eliquis  due to history of incidental PE in March 2023.   The patient was advised to call immediately if she has any concerning symptoms in the interval. The patient voices understanding of current disease status and treatment options and is in agreement with the current care plan. All questions were answered. The patient knows to call the clinic with any problems, questions or concerns. We can certainly see the patient much sooner if necessary   No orders of the defined types were placed in this encounter.     Shequita Peplinski Santana Andy Allende, PA-C 06/23/2024  ADDENDUM: Hematology/Oncology Attending: I had a face-to-face encounter with the patient today.  I reviewed her records, lab, scan and recommended her care plan.  This is a very pleasant 79 years old female with low-grade neuroendocrine carcinoma, carcinoid tumor presented with innumerable bilateral pulmonary nodules diagnosed in March 2023 and has been on treatment with lanreotide every 4 weeks status post 3 cycles.  She has been tolerating her treatment fairly well.  She had significant electrolyte abnormalities.  Specifically low magnesium  and had required magnesium  replenishment intravenously many times recently.  She is followed by nephrology also for this condition.  She had repeat CT scan of the chest performed recently.  I personally independently reviewed the scan and discussed the result with the patient today.  Her scan showed no concerning findings for progression. I recommended for her to continue her current treatment with lanreotide on monthly basis. Will  recheck her electrolytes and continue magnesium  replacement as needed. The patient will come back for follow-up visit in 1 months with the next injection time. She was advised to call immediately if she has any other concerning symptoms in the interval. Disclaimer: This note was dictated with voice recognition software. Similar sounding words can inadvertently be transcribed and may be missed upon review. Michaela MARLA Sherrod, MD

## 2024-06-22 ENCOUNTER — Other Ambulatory Visit: Payer: Self-pay | Admitting: *Deleted

## 2024-06-23 ENCOUNTER — Inpatient Hospital Stay

## 2024-06-23 ENCOUNTER — Inpatient Hospital Stay: Attending: Internal Medicine

## 2024-06-23 ENCOUNTER — Telehealth: Payer: Self-pay | Admitting: Medical Oncology

## 2024-06-23 ENCOUNTER — Other Ambulatory Visit: Payer: Self-pay | Admitting: Physician Assistant

## 2024-06-23 ENCOUNTER — Inpatient Hospital Stay: Admitting: Physician Assistant

## 2024-06-23 VITALS — BP 130/63 | HR 56 | Temp 97.6°F | Resp 17 | Ht 61.0 in | Wt 174.6 lb

## 2024-06-23 VITALS — BP 127/50 | HR 52 | Resp 18

## 2024-06-23 DIAGNOSIS — Z7901 Long term (current) use of anticoagulants: Secondary | ICD-10-CM | POA: Insufficient documentation

## 2024-06-23 DIAGNOSIS — E876 Hypokalemia: Secondary | ICD-10-CM | POA: Diagnosis not present

## 2024-06-23 DIAGNOSIS — R638 Other symptoms and signs concerning food and fluid intake: Secondary | ICD-10-CM | POA: Diagnosis not present

## 2024-06-23 DIAGNOSIS — C3492 Malignant neoplasm of unspecified part of left bronchus or lung: Secondary | ICD-10-CM

## 2024-06-23 DIAGNOSIS — C7A090 Malignant carcinoid tumor of the bronchus and lung: Secondary | ICD-10-CM | POA: Diagnosis present

## 2024-06-23 DIAGNOSIS — Z86711 Personal history of pulmonary embolism: Secondary | ICD-10-CM | POA: Diagnosis not present

## 2024-06-23 DIAGNOSIS — Z79899 Other long term (current) drug therapy: Secondary | ICD-10-CM | POA: Diagnosis not present

## 2024-06-23 LAB — CBC WITH DIFFERENTIAL (CANCER CENTER ONLY)
Abs Immature Granulocytes: 0.02 K/uL (ref 0.00–0.07)
Basophils Absolute: 0.1 K/uL (ref 0.0–0.1)
Basophils Relative: 1 %
Eosinophils Absolute: 0.3 K/uL (ref 0.0–0.5)
Eosinophils Relative: 4 %
HCT: 33.6 % — ABNORMAL LOW (ref 36.0–46.0)
Hemoglobin: 11.5 g/dL — ABNORMAL LOW (ref 12.0–15.0)
Immature Granulocytes: 0 %
Lymphocytes Relative: 24 %
Lymphs Abs: 2 K/uL (ref 0.7–4.0)
MCH: 32.9 pg (ref 26.0–34.0)
MCHC: 34.2 g/dL (ref 30.0–36.0)
MCV: 96 fL (ref 80.0–100.0)
Monocytes Absolute: 0.6 K/uL (ref 0.1–1.0)
Monocytes Relative: 7 %
Neutro Abs: 5.4 K/uL (ref 1.7–7.7)
Neutrophils Relative %: 64 %
Platelet Count: 283 K/uL (ref 150–400)
RBC: 3.5 MIL/uL — ABNORMAL LOW (ref 3.87–5.11)
RDW: 13.8 % (ref 11.5–15.5)
WBC Count: 8.4 K/uL (ref 4.0–10.5)
nRBC: 0 % (ref 0.0–0.2)

## 2024-06-23 LAB — CMP (CANCER CENTER ONLY)
Albumin: 4 g/dL (ref 3.5–5.0)
Alkaline Phosphatase: 76 U/L (ref 38–126)
Anion gap: 16 — ABNORMAL HIGH (ref 5–15)
BUN: 29 mg/dL — ABNORMAL HIGH (ref 8–23)
CO2: 14 mmol/L — ABNORMAL LOW (ref 22–32)
Calcium: 8.7 mg/dL — ABNORMAL LOW (ref 8.9–10.3)
Chloride: 104 mmol/L (ref 98–111)
Creatinine: 1.48 mg/dL — ABNORMAL HIGH (ref 0.44–1.00)
GFR, Estimated: 36 mL/min — ABNORMAL LOW
Glucose, Bld: 175 mg/dL — ABNORMAL HIGH (ref 70–99)
Sodium: 134 mmol/L — ABNORMAL LOW (ref 135–145)
Total Bilirubin: 0.4 mg/dL (ref 0.0–1.2)
Total Protein: 6.6 g/dL (ref 6.5–8.1)

## 2024-06-23 LAB — MAGNESIUM: Magnesium: 1.4 mg/dL — ABNORMAL LOW (ref 1.7–2.4)

## 2024-06-23 MED ORDER — MAGNESIUM SULFATE 2 GM/50ML IV SOLN
2.0000 g | Freq: Once | INTRAVENOUS | Status: AC
  Administered 2024-06-23: 2 g via INTRAVENOUS
  Filled 2024-06-23: qty 50

## 2024-06-23 MED ORDER — SODIUM CHLORIDE 0.9 % IV SOLN: Freq: Once | INTRAVENOUS | Status: AC

## 2024-06-23 MED ORDER — LANREOTIDE ACETATE 120 MG/0.5ML ~~LOC~~ SOLN
120.0000 mg | Freq: Once | SUBCUTANEOUS | Status: AC
  Administered 2024-06-23: 120 mg via SUBCUTANEOUS
  Filled 2024-06-23: qty 0.5

## 2024-06-23 NOTE — Patient Instructions (Signed)
 Rehydration, Older Adult  Rehydration is the replacement of fluids, salts, and minerals in the body (electrolytes) that are lost during dehydration. Dehydration is when there is not enough water or other fluids in the body. This happens when you lose more fluids than you take in. People who are age 78 or older have a higher risk of dehydration than younger adults. This is because in older age, the body: Is less able to maintain the right amount of water. Does not respond to temperature changes as well. Does not get a sense of thirst as easily or quickly. Other causes include: Not drinking enough fluids. This can occur when you are ill, when you forget to drink, or when you are doing activities that require a lot of energy, especially in hot weather. Conditions that cause loss of water or other fluids. These include diarrhea, vomiting, sweating, or urinating a lot. Other illnesses, such as fever or infection. Certain medicines, such as those that remove excess fluid from the body (diuretics). Symptoms of mild or moderate dehydration may include thirst, dry lips and mouth, and dizziness. Symptoms of severe dehydration may include increased heart rate, confusion, fainting, and not urinating. In severe cases, you may need to get fluids through an IV at the hospital. For mild or moderate cases, you can usually rehydrate at home by drinking certain fluids as told by your health care provider. What are the risks? Rehydration is usually safe. Taking in too much fluid (overhydration) can be a problem but is rare. Overhydration can cause an imbalance of electrolytes in the body, kidney failure, fluid in the lungs, or a decrease in salt (sodium) levels in the body. Supplies needed: You will need an oral rehydration solution (ORS) if your health care provider tells you to use one. This is a drink to treat dehydration. It can be found in pharmacies and retail stores. How to rehydrate Fluids Follow  instructions from your health care provider about what to drink. The kind of fluid and the amount you should drink depend on your condition. In general, you should choose drinks that you prefer. If told by your health care provider, drink an ORS. Make an ORS by following instructions on the package. Start by drinking small amounts, about  cup (120 mL) every 5-10 minutes. Slowly increase how much you drink until you have taken in the amount recommended by your health care provider. Drink enough clear fluids to keep your urine pale yellow. If you were told to drink an ORS, finish it first, then start slowly drinking other clear fluids. Drink fluids such as: Water. This includes sparkling and flavored water. Drinking only water can lead to having too little sodium in your body (hyponatremia). Follow the advice of your health care provider. Water from ice chips you suck on. Fruit juice with water added to it(diluted). Sports drinks. Hot or cold herbal teas. Broth-based soups. Coffee. Milk or milk products. Food Follow instructions from your health care provider about what to eat while you rehydrate. Your health care provider may recommend that you slowly begin eating regular foods in small amounts. Eat foods that contain a healthy balance of electrolytes, such as bananas, oranges, potatoes, tomatoes, and spinach. Avoid foods that are greasy or contain a lot of sugar. In some cases, you may get nutrition through a feeding tube that is passed through your nose and into your stomach (nasogastric tube, or NG tube). This may be done if you have uncontrolled vomiting or diarrhea. Drinks to avoid  Certain drinks may make dehydration worse. While you rehydrate, avoid drinking alcohol. How to tell if you are recovering from dehydration You may be getting better if: You are urinating more often than before you started rehydrating. Your urine is pale yellow. Your energy level improves. You vomit less  often. You have diarrhea less often. Your appetite improves or returns to normal. You feel less dizzy or light-headed. Your skin tone and color start to look more normal. Follow these instructions at home: Take over-the-counter and prescription medicines only as told by your health care provider. Do not take sodium tablets. Doing this can lead to having too much sodium in your body (hypernatremia). Contact a health care provider if: You continue to have symptoms of mild or moderate dehydration, such as: Thirst. Dry lips. Slightly dry mouth. Dizziness. Dark urine or less urine than usual. Muscle cramps. You continue to vomit or have diarrhea. Get help right away if: You have symptoms of dehydration that get worse. You have a fever. You have a severe headache. You have been vomiting and have problems, such as: Your vomiting gets worse. Your vomit includes blood or green matter (bile). You cannot eat or drink without vomiting. You have problems with urination or bowel movements, such as: Diarrhea that gets worse. Blood in your stool (feces). This may cause stool to look black and tarry. Not urinating, or urinating only a small amount of very dark urine, within 6-8 hours. You have trouble breathing. You have symptoms that get worse with treatment. These symptoms may be an emergency. Get help right away. Call 911. Do not wait to see if the symptoms will go away. Do not drive yourself to the hospital. This information is not intended to replace advice given to you by your health care provider. Make sure you discuss any questions you have with your health care provider.  Lanreotide Injection What is this medication? LANREOTIDE (lan REE oh tide) treats high levels of growth hormone (acromegaly). It is used when other therapies have not worked well enough or cannot be tolerated. It works by reducing the amount of growth hormone your body makes. This reduces symptoms and the risk of  health problems caused by too much growth hormone, such as diabetes and heart disease. It may also be used to treat neuroendocrine tumors, a cancer of the cells that release hormones and other substances in your body. It works by slowing down the release of these substances from the cells. This slows tumor growth. It also decreases the symptoms of carcinoid syndrome, such as flushing or diarrhea. This medicine may be used for other purposes; ask your health care provider or pharmacist if you have questions. COMMON BRAND NAME(S): Somatuline Depot  What should I tell my care team before I take this medication? They need to know if you have any of these conditions: Diabetes Gallbladder disease Heart disease Kidney disease Liver disease Pancreatic disease Thyroid  disease An unusual or allergic reaction to lanreotide, other medications, foods, dyes, or preservatives Pregnant or trying to get pregnant Breastfeeding How should I use this medication? This medication is injected under the skin. It is given by your care team in a hospital or clinic setting. Talk to your care team about the use of this medication in children. Special care may be needed. Overdosage: If you think you have taken too much of this medicine contact a poison control center or emergency room at once. NOTE: This medicine is only for you. Do not share this medicine with  others. What if I miss a dose? Keep appointments for follow-up doses. It is important not to miss your dose. Call your care team if you are unable to keep an appointment. What may interact with this medication? Bromocriptine Cyclosporine Certain medications for blood pressure, heart disease, irregular heartbeat Certain medications for diabetes Quinidine Terfenadine This list may not describe all possible interactions. Give your health care provider a list of all the medicines, herbs, non-prescription drugs, or dietary supplements you use. Also tell them if you  smoke, drink alcohol, or use illegal drugs. Some items may interact with your medicine. What should I watch for while using this medication? Visit your care team for regular checks on your progress. Tell your care team if your symptoms do not start to get better or if they get worse. Your condition will be monitored carefully while you are receiving this medication. You may need blood work while you are taking this medication. This medication may increase blood sugar. The risk may be higher in patients who already have diabetes. Ask your care team what you can do to lower your risk of diabetes while taking this medication. Talk to your care team if you wish to become pregnant or think you may be pregnant. This medication can cause serious birth defects. Do not breast-feed while taking this medication and for 6 months after stopping therapy. This medication may cause infertility. Talk to your care team if you are concerned about your fertility. What side effects may I notice from receiving this medication? Side effects that you should report to your care team as soon as possible: Allergic reactions--skin rash, itching, hives, swelling of the face, lips, tongue, or throat Gallbladder problems--severe stomach pain, nausea, vomiting, fever High blood sugar (hyperglycemia)--increased thirst or amount of urine, unusual weakness or fatigue, blurry vision Increase in blood pressure Low blood sugar (hypoglycemia)--pale, blue or purple skin or lips, sweating, fussiness, rapid heartbeat, poor feeding, low body temperature Low thyroid  levels (hypothyroidism)--unusual weakness or fatigue, increased sensitivity to cold, constipation, hair loss, dry skin, weight gain, feelings of depression Oily or light-colored stools, diarrhea, bloating, weight loss Slow heartbeat--dizziness, feeling faint or lightheaded, confusion, trouble breathing, unusual weakness or fatigue Side effects that usually do not require medical  attention (report these to your care team if they continue or are bothersome): Diarrhea Dizziness Headache Muscle spasms Nausea Pain, redness, or irritation at injection site Stomach pain This list may not describe all possible side effects. Call your doctor for medical advice about side effects. You may report side effects to FDA at 1-800-FDA-1088. Where should I keep my medication? This medication is given in a hospital or clinic. It will not be stored at home. NOTE: This sheet is a summary. It may not cover all possible information. If you have questions about this medicine, talk to your doctor, pharmacist, or health care provider.  2024 Elsevier/Gold Standard (2023-05-10 00:00:00) Document Revised: 10/11/2021 Document Reviewed: 10/09/2021 Elsevier Patient Education  2024 [employer].

## 2024-06-23 NOTE — Telephone Encounter (Signed)
 Lab reported tha tpts blood was hemolyzed and the K+, AST and ALT had  high hemolytic interference. Pt's creatinine was reported as .1.48 and Mag =1.4. The rest of CMP tests will be run.

## 2024-06-30 ENCOUNTER — Encounter: Payer: Self-pay | Admitting: Internal Medicine

## 2024-07-01 ENCOUNTER — Other Ambulatory Visit: Payer: Self-pay | Admitting: Physician Assistant

## 2024-07-01 ENCOUNTER — Inpatient Hospital Stay

## 2024-07-01 ENCOUNTER — Other Ambulatory Visit: Payer: Self-pay | Admitting: *Deleted

## 2024-07-01 ENCOUNTER — Other Ambulatory Visit: Payer: Self-pay | Admitting: Medical Oncology

## 2024-07-01 ENCOUNTER — Telehealth: Payer: Self-pay | Admitting: Medical Oncology

## 2024-07-01 DIAGNOSIS — C7A090 Malignant carcinoid tumor of the bronchus and lung: Secondary | ICD-10-CM | POA: Diagnosis not present

## 2024-07-01 DIAGNOSIS — R638 Other symptoms and signs concerning food and fluid intake: Secondary | ICD-10-CM

## 2024-07-01 DIAGNOSIS — E875 Hyperkalemia: Secondary | ICD-10-CM

## 2024-07-01 DIAGNOSIS — E860 Dehydration: Secondary | ICD-10-CM

## 2024-07-01 LAB — CBC WITH DIFFERENTIAL (CANCER CENTER ONLY)
Abs Immature Granulocytes: 0.03 K/uL (ref 0.00–0.07)
Basophils Absolute: 0.1 K/uL (ref 0.0–0.1)
Basophils Relative: 1 %
Eosinophils Absolute: 0.5 K/uL (ref 0.0–0.5)
Eosinophils Relative: 5 %
HCT: 34.8 % — ABNORMAL LOW (ref 36.0–46.0)
Hemoglobin: 11.6 g/dL — ABNORMAL LOW (ref 12.0–15.0)
Immature Granulocytes: 0 %
Lymphocytes Relative: 30 %
Lymphs Abs: 2.9 K/uL (ref 0.7–4.0)
MCH: 32.8 pg (ref 26.0–34.0)
MCHC: 33.3 g/dL (ref 30.0–36.0)
MCV: 98.3 fL (ref 80.0–100.0)
Monocytes Absolute: 0.6 K/uL (ref 0.1–1.0)
Monocytes Relative: 6 %
Neutro Abs: 5.6 K/uL (ref 1.7–7.7)
Neutrophils Relative %: 58 %
Platelet Count: 340 K/uL (ref 150–400)
RBC: 3.54 MIL/uL — ABNORMAL LOW (ref 3.87–5.11)
RDW: 14.2 % (ref 11.5–15.5)
WBC Count: 9.7 K/uL (ref 4.0–10.5)
nRBC: 0 % (ref 0.0–0.2)

## 2024-07-01 LAB — CMP (CANCER CENTER ONLY)
ALT: 21 U/L (ref 0–44)
AST: 20 U/L (ref 15–41)
Albumin: 4.4 g/dL (ref 3.5–5.0)
Alkaline Phosphatase: 79 U/L (ref 38–126)
Anion gap: 18 — ABNORMAL HIGH (ref 5–15)
BUN: 36 mg/dL — ABNORMAL HIGH (ref 8–23)
CO2: 16 mmol/L — ABNORMAL LOW (ref 22–32)
Calcium: 9.8 mg/dL (ref 8.9–10.3)
Chloride: 99 mmol/L (ref 98–111)
Creatinine: 1.48 mg/dL — ABNORMAL HIGH (ref 0.44–1.00)
GFR, Estimated: 36 mL/min — ABNORMAL LOW
Glucose, Bld: 307 mg/dL — ABNORMAL HIGH (ref 70–99)
Potassium: 5.9 mmol/L — ABNORMAL HIGH (ref 3.5–5.1)
Sodium: 133 mmol/L — ABNORMAL LOW (ref 135–145)
Total Bilirubin: 0.3 mg/dL (ref 0.0–1.2)
Total Protein: 7.3 g/dL (ref 6.5–8.1)

## 2024-07-01 LAB — BASIC METABOLIC PANEL - CANCER CENTER ONLY
Anion gap: 13 (ref 5–15)
BUN: 34 mg/dL — ABNORMAL HIGH (ref 8–23)
CO2: 19 mmol/L — ABNORMAL LOW (ref 22–32)
Calcium: 9.7 mg/dL (ref 8.9–10.3)
Chloride: 103 mmol/L (ref 98–111)
Creatinine: 1.3 mg/dL — ABNORMAL HIGH (ref 0.44–1.00)
GFR, Estimated: 42 mL/min — ABNORMAL LOW
Glucose, Bld: 148 mg/dL — ABNORMAL HIGH (ref 70–99)
Potassium: 6.2 mmol/L — ABNORMAL HIGH (ref 3.5–5.1)
Sodium: 135 mmol/L (ref 135–145)

## 2024-07-01 LAB — MAGNESIUM
Magnesium: 1.6 mg/dL — ABNORMAL LOW (ref 1.7–2.4)
Magnesium: 1.6 mg/dL — ABNORMAL LOW (ref 1.7–2.4)

## 2024-07-01 MED ORDER — MAGNESIUM SULFATE 2 GM/50ML IV SOLN
2.0000 g | Freq: Once | INTRAVENOUS | Status: AC
  Administered 2024-07-01: 2 g via INTRAVENOUS
  Filled 2024-07-01: qty 50

## 2024-07-01 MED ORDER — SODIUM CHLORIDE 0.9 % IV SOLN: INTRAVENOUS | Status: DC

## 2024-07-01 MED ORDER — SODIUM POLYSTYRENE SULFONATE PO POWD: Freq: Once | ORAL | 0 refills | Status: AC

## 2024-07-01 NOTE — Progress Notes (Signed)
 Hyperkalemia- Pt and dtr given instructions to take 1 dose of kayexalate  today when she gets home and to come back tomorrow at 0915 to recheck the potassium. They both voiced understanding.

## 2024-07-01 NOTE — Telephone Encounter (Signed)
 Faxed labs to PCP with receipt confirmation.

## 2024-07-02 ENCOUNTER — Inpatient Hospital Stay

## 2024-07-02 ENCOUNTER — Other Ambulatory Visit (HOSPITAL_COMMUNITY): Payer: Self-pay

## 2024-07-02 ENCOUNTER — Other Ambulatory Visit: Payer: Self-pay | Admitting: Physician Assistant

## 2024-07-02 ENCOUNTER — Telehealth: Payer: Self-pay | Admitting: Medical Oncology

## 2024-07-02 ENCOUNTER — Telehealth: Payer: Self-pay

## 2024-07-02 DIAGNOSIS — E875 Hyperkalemia: Secondary | ICD-10-CM

## 2024-07-02 DIAGNOSIS — C7A090 Malignant carcinoid tumor of the bronchus and lung: Secondary | ICD-10-CM | POA: Diagnosis not present

## 2024-07-02 LAB — BASIC METABOLIC PANEL - CANCER CENTER ONLY
Anion gap: 13 (ref 5–15)
BUN: 28 mg/dL — ABNORMAL HIGH (ref 8–23)
CO2: 19 mmol/L — ABNORMAL LOW (ref 22–32)
Calcium: 9.6 mg/dL (ref 8.9–10.3)
Chloride: 103 mmol/L (ref 98–111)
Creatinine: 1.34 mg/dL — ABNORMAL HIGH (ref 0.44–1.00)
GFR, Estimated: 41 mL/min — ABNORMAL LOW
Glucose, Bld: 226 mg/dL — ABNORMAL HIGH (ref 70–99)
Potassium: 5.5 mmol/L — ABNORMAL HIGH (ref 3.5–5.1)
Sodium: 135 mmol/L (ref 135–145)

## 2024-07-02 MED ORDER — SODIUM POLYSTYRENE SULFONATE 15 GM/60ML CO SUSP: 15.0000 g | Freq: Once | 0 refills | Status: AC

## 2024-07-02 MED ORDER — SODIUM POLYSTYRENE SULFONATE PO POWD
Freq: Once | ORAL | 0 refills | Status: AC
  Filled 2024-07-02: qty 15, fill #0

## 2024-07-02 NOTE — Telephone Encounter (Addendum)
 Faxed pt labs ( K+) to Dr Chrystal. Receipt confirmation obtained.

## 2024-07-02 NOTE — Telephone Encounter (Signed)
 Potassium 5.5.  Per [employer] PA Rx for Michaela Santana sent in. Informed pts daughter.

## 2024-07-07 ENCOUNTER — Inpatient Hospital Stay

## 2024-07-07 ENCOUNTER — Other Ambulatory Visit: Payer: Self-pay | Admitting: Physician Assistant

## 2024-07-07 ENCOUNTER — Telehealth: Payer: Self-pay

## 2024-07-07 DIAGNOSIS — C7A090 Malignant carcinoid tumor of the bronchus and lung: Secondary | ICD-10-CM | POA: Diagnosis not present

## 2024-07-07 DIAGNOSIS — E875 Hyperkalemia: Secondary | ICD-10-CM

## 2024-07-07 LAB — CMP (CANCER CENTER ONLY)
ALT: 22 U/L (ref 0–44)
AST: 22 U/L (ref 15–41)
Albumin: 4.1 g/dL (ref 3.5–5.0)
Alkaline Phosphatase: 67 U/L (ref 38–126)
Anion gap: 14 (ref 5–15)
BUN: 27 mg/dL — ABNORMAL HIGH (ref 8–23)
CO2: 18 mmol/L — ABNORMAL LOW (ref 22–32)
Calcium: 9.3 mg/dL (ref 8.9–10.3)
Chloride: 106 mmol/L (ref 98–111)
Creatinine: 1.26 mg/dL — ABNORMAL HIGH (ref 0.44–1.00)
GFR, Estimated: 44 mL/min — ABNORMAL LOW
Glucose, Bld: 207 mg/dL — ABNORMAL HIGH (ref 70–99)
Potassium: 5.6 mmol/L — ABNORMAL HIGH (ref 3.5–5.1)
Sodium: 137 mmol/L (ref 135–145)
Total Bilirubin: 0.4 mg/dL (ref 0.0–1.2)
Total Protein: 6.5 g/dL (ref 6.5–8.1)

## 2024-07-07 LAB — MAGNESIUM: Magnesium: 1.4 mg/dL — ABNORMAL LOW (ref 1.7–2.4)

## 2024-07-07 MED ORDER — SODIUM POLYSTYRENE SULFONATE PO POWD: Freq: Once | ORAL | 0 refills | Status: AC

## 2024-07-07 NOTE — Telephone Encounter (Signed)
 Spoke with patients daughter, [name], regarding recent lab results. Per Cassie, PA, patients potassium level remains slightly elevated. [name] reported that patient is not taking potassium supplements and is not consuming potassium-rich foods. Cassie, PA, will send in Kayexalate  to assist with lowering potassium level. [name] was advised that Cassie would like labs rechecked on Thursday. Patient scheduled for 07/09/24 at 11:30 AM. Charge nurse notified to see if we had space to hold a spot for possible magnesium  replacement as magnesium  level is trending downward. [name] verbalized understanding.

## 2024-07-08 ENCOUNTER — Inpatient Hospital Stay

## 2024-07-09 ENCOUNTER — Other Ambulatory Visit: Payer: Self-pay

## 2024-07-09 ENCOUNTER — Other Ambulatory Visit: Payer: Self-pay | Admitting: Physician Assistant

## 2024-07-09 ENCOUNTER — Inpatient Hospital Stay

## 2024-07-09 DIAGNOSIS — E875 Hyperkalemia: Secondary | ICD-10-CM

## 2024-07-09 DIAGNOSIS — C7A090 Malignant carcinoid tumor of the bronchus and lung: Secondary | ICD-10-CM | POA: Diagnosis not present

## 2024-07-09 LAB — BASIC METABOLIC PANEL - CANCER CENTER ONLY
Anion gap: 15 (ref 5–15)
BUN: 23 mg/dL (ref 8–23)
CO2: 18 mmol/L — ABNORMAL LOW (ref 22–32)
Calcium: 9.3 mg/dL (ref 8.9–10.3)
Chloride: 104 mmol/L (ref 98–111)
Creatinine: 1.15 mg/dL — ABNORMAL HIGH (ref 0.44–1.00)
GFR, Estimated: 49 mL/min — ABNORMAL LOW
Glucose, Bld: 199 mg/dL — ABNORMAL HIGH (ref 70–99)
Potassium: 4.8 mmol/L (ref 3.5–5.1)
Sodium: 137 mmol/L (ref 135–145)

## 2024-07-09 LAB — MAGNESIUM: Magnesium: 1.5 mg/dL — ABNORMAL LOW (ref 1.7–2.4)

## 2024-07-09 MED ORDER — SODIUM CHLORIDE 0.9 % IV SOLN: Freq: Once | INTRAVENOUS | Status: AC

## 2024-07-09 MED ORDER — MAGNESIUM SULFATE 2 GM/50ML IV SOLN
2.0000 g | Freq: Once | INTRAVENOUS | Status: AC
  Administered 2024-07-09: 2 g via INTRAVENOUS
  Filled 2024-07-09: qty 50

## 2024-07-09 NOTE — Patient Instructions (Signed)
 Magnesium Sulfate Injection What is this medication? MAGNESIUM SULFATE (mag NEE zee um SUL fate) prevents and treats low levels of magnesium in your body. It may also be used to prevent and treat seizures during pregnancy in people with high blood pressure disorders, such as preeclampsia or eclampsia. Magnesium plays an important role in maintaining the health of your muscles and nervous system. This medicine may be used for other purposes; ask your health care provider or pharmacist if you have questions. What should I tell my care team before I take this medication? They need to know if you have any of these conditions: Heart disease History of irregular heart beat Kidney disease An unusual or allergic reaction to magnesium sulfate, medications, foods, dyes, or preservatives Pregnant or trying to get pregnant Breast-feeding How should I use this medication? This medication is for infusion into a vein. It is given in a hospital or clinic setting. Talk to your care team about the use of this medication in children. While this medication may be prescribed for selected conditions, precautions do apply. Overdosage: If you think you have taken too much of this medicine contact a poison control center or emergency room at once. NOTE: This medicine is only for you. Do not share this medicine with others. What if I miss a dose? This does not apply. What may interact with this medication? Certain medications for anxiety or sleep Certain medications for seizures, such phenobarbital Digoxin Medications that relax muscles for surgery Narcotic medications for pain This list may not describe all possible interactions. Give your health care provider a list of all the medicines, herbs, non-prescription drugs, or dietary supplements you use. Also tell them if you smoke, drink alcohol, or use illegal drugs. Some items may interact with your medicine. What should I watch for while using this  medication? Your condition will be monitored carefully while you are receiving this medication. You may need blood work done while you are receiving this medication. What side effects may I notice from receiving this medication? Side effects that you should report to your care team as soon as possible: Allergic reactions--skin rash, itching, hives, swelling of the face, lips, tongue, or throat High magnesium level--confusion, drowsiness, facial flushing, redness, sweating, muscle weakness, fast or irregular heartbeat, trouble breathing Low blood pressure--dizziness, feeling faint or lightheaded, blurry vision Side effects that usually do not require medical attention (report to your care team if they continue or are bothersome): Headache Nausea This list may not describe all possible side effects. Call your doctor for medical advice about side effects. You may report side effects to FDA at 1-800-FDA-1088. Where should I keep my medication? This medication is given in a hospital or clinic and will not be stored at home. NOTE: This sheet is a summary. It may not cover all possible information. If you have questions about this medicine, talk to your doctor, pharmacist, or health care provider.  2024 Elsevier/Gold Standard (2021-02-08 00:00:00)

## 2024-07-10 ENCOUNTER — Observation Stay (HOSPITAL_COMMUNITY)
Admission: EM | Admit: 2024-07-10 | Discharge: 2024-07-11 | Disposition: A | Attending: Family Medicine | Admitting: Family Medicine

## 2024-07-10 ENCOUNTER — Encounter (HOSPITAL_COMMUNITY): Payer: Self-pay | Admitting: Emergency Medicine

## 2024-07-10 DIAGNOSIS — K922 Gastrointestinal hemorrhage, unspecified: Secondary | ICD-10-CM | POA: Diagnosis present

## 2024-07-10 DIAGNOSIS — D62 Acute posthemorrhagic anemia: Secondary | ICD-10-CM | POA: Diagnosis present

## 2024-07-10 DIAGNOSIS — E1122 Type 2 diabetes mellitus with diabetic chronic kidney disease: Secondary | ICD-10-CM | POA: Diagnosis not present

## 2024-07-10 DIAGNOSIS — D509 Iron deficiency anemia, unspecified: Secondary | ICD-10-CM | POA: Insufficient documentation

## 2024-07-10 DIAGNOSIS — K449 Diaphragmatic hernia without obstruction or gangrene: Secondary | ICD-10-CM | POA: Diagnosis not present

## 2024-07-10 DIAGNOSIS — K920 Hematemesis: Secondary | ICD-10-CM | POA: Diagnosis present

## 2024-07-10 DIAGNOSIS — Z79899 Other long term (current) drug therapy: Secondary | ICD-10-CM | POA: Diagnosis not present

## 2024-07-10 DIAGNOSIS — E119 Type 2 diabetes mellitus without complications: Secondary | ICD-10-CM

## 2024-07-10 DIAGNOSIS — I130 Hypertensive heart and chronic kidney disease with heart failure and stage 1 through stage 4 chronic kidney disease, or unspecified chronic kidney disease: Secondary | ICD-10-CM | POA: Diagnosis not present

## 2024-07-10 DIAGNOSIS — N1831 Chronic kidney disease, stage 3a: Secondary | ICD-10-CM | POA: Diagnosis not present

## 2024-07-10 DIAGNOSIS — I509 Heart failure, unspecified: Secondary | ICD-10-CM | POA: Diagnosis not present

## 2024-07-10 DIAGNOSIS — N183 Chronic kidney disease, stage 3 unspecified: Secondary | ICD-10-CM | POA: Insufficient documentation

## 2024-07-10 LAB — CBC WITH DIFFERENTIAL/PLATELET
Abs Immature Granulocytes: 0.03 10*3/uL (ref 0.00–0.07)
Basophils Absolute: 0.1 10*3/uL (ref 0.0–0.1)
Basophils Relative: 1 %
Eosinophils Absolute: 0.3 10*3/uL (ref 0.0–0.5)
Eosinophils Relative: 2 %
HCT: 28.9 % — ABNORMAL LOW (ref 36.0–46.0)
Hemoglobin: 8.8 g/dL — ABNORMAL LOW (ref 12.0–15.0)
Immature Granulocytes: 0 %
Lymphocytes Relative: 15 %
Lymphs Abs: 1.6 10*3/uL (ref 0.7–4.0)
MCH: 31.8 pg (ref 26.0–34.0)
MCHC: 30.4 g/dL (ref 30.0–36.0)
MCV: 104.3 fL — ABNORMAL HIGH (ref 80.0–100.0)
Monocytes Absolute: 0.8 10*3/uL (ref 0.1–1.0)
Monocytes Relative: 7 %
Neutro Abs: 8 10*3/uL — ABNORMAL HIGH (ref 1.7–7.7)
Neutrophils Relative %: 75 %
Platelets: 296 10*3/uL (ref 150–400)
RBC: 2.77 MIL/uL — ABNORMAL LOW (ref 3.87–5.11)
RDW: 13.9 % (ref 11.5–15.5)
WBC: 10.8 10*3/uL — ABNORMAL HIGH (ref 4.0–10.5)
nRBC: 0 % (ref 0.0–0.2)

## 2024-07-10 LAB — HEMOGLOBIN AND HEMATOCRIT, BLOOD
HCT: 31.9 % — ABNORMAL LOW (ref 36.0–46.0)
Hemoglobin: 9.9 g/dL — ABNORMAL LOW (ref 12.0–15.0)

## 2024-07-10 LAB — COMPREHENSIVE METABOLIC PANEL WITH GFR
ALT: 21 U/L (ref 0–44)
AST: 22 U/L (ref 15–41)
Albumin: 3.4 g/dL — ABNORMAL LOW (ref 3.5–5.0)
Alkaline Phosphatase: 61 U/L (ref 38–126)
Anion gap: 9 (ref 5–15)
BUN: 44 mg/dL — ABNORMAL HIGH (ref 8–23)
CO2: 20 mmol/L — ABNORMAL LOW (ref 22–32)
Calcium: 8.8 mg/dL — ABNORMAL LOW (ref 8.9–10.3)
Chloride: 107 mmol/L (ref 98–111)
Creatinine, Ser: 1.44 mg/dL — ABNORMAL HIGH (ref 0.44–1.00)
GFR, Estimated: 37 mL/min — ABNORMAL LOW
Glucose, Bld: 244 mg/dL — ABNORMAL HIGH (ref 70–99)
Potassium: 5 mmol/L (ref 3.5–5.1)
Sodium: 136 mmol/L (ref 135–145)
Total Bilirubin: 0.2 mg/dL (ref 0.0–1.2)
Total Protein: 5.7 g/dL — ABNORMAL LOW (ref 6.5–8.1)

## 2024-07-10 LAB — PROTIME-INR
INR: 1 (ref 0.8–1.2)
Prothrombin Time: 13.3 s (ref 11.4–15.2)

## 2024-07-10 LAB — GLUCOSE, CAPILLARY
Glucose-Capillary: 176 mg/dL — ABNORMAL HIGH (ref 70–99)
Glucose-Capillary: 187 mg/dL — ABNORMAL HIGH (ref 70–99)
Glucose-Capillary: 224 mg/dL — ABNORMAL HIGH (ref 70–99)

## 2024-07-10 LAB — PREPARE RBC (CROSSMATCH)

## 2024-07-10 LAB — CBG MONITORING, ED: Glucose-Capillary: 174 mg/dL — ABNORMAL HIGH (ref 70–99)

## 2024-07-10 LAB — LIPASE, BLOOD: Lipase: 48 U/L (ref 11–51)

## 2024-07-10 MED ORDER — DULOXETINE HCL 20 MG PO CPEP
40.0000 mg | ORAL_CAPSULE | Freq: Every day | ORAL | Status: DC
  Administered 2024-07-10 – 2024-07-11 (×2): 40 mg via ORAL
  Filled 2024-07-10 (×2): qty 2

## 2024-07-10 MED ORDER — IRBESARTAN 150 MG PO TABS
75.0000 mg | ORAL_TABLET | Freq: Every day | ORAL | Status: DC
  Administered 2024-07-10 – 2024-07-11 (×2): 75 mg via ORAL
  Filled 2024-07-10 (×2): qty 1

## 2024-07-10 MED ORDER — SODIUM CHLORIDE 0.9% IV SOLUTION: Freq: Once | INTRAVENOUS | Status: DC

## 2024-07-10 MED ORDER — QUETIAPINE FUMARATE 25 MG PO TABS
12.5000 mg | ORAL_TABLET | ORAL | Status: DC
  Filled 2024-07-10: qty 1

## 2024-07-10 MED ORDER — PANTOPRAZOLE SODIUM 40 MG IV SOLR
40.0000 mg | Freq: Once | INTRAVENOUS | Status: AC
  Administered 2024-07-10: 40 mg via INTRAVENOUS
  Filled 2024-07-10: qty 10

## 2024-07-10 MED ORDER — INSULIN ASPART 100 UNIT/ML IJ SOLN
0.0000 [IU] | INTRAMUSCULAR | Status: DC
  Administered 2024-07-10: 5 [IU] via SUBCUTANEOUS
  Administered 2024-07-10: 3 [IU] via SUBCUTANEOUS
  Administered 2024-07-11: 2 [IU] via SUBCUTANEOUS
  Administered 2024-07-11: 5 [IU] via SUBCUTANEOUS
  Administered 2024-07-11: 3 [IU] via SUBCUTANEOUS
  Filled 2024-07-10: qty 5
  Filled 2024-07-10: qty 2
  Filled 2024-07-10: qty 3
  Filled 2024-07-10: qty 5
  Filled 2024-07-10: qty 3

## 2024-07-10 MED ORDER — METOCLOPRAMIDE HCL 5 MG/ML IJ SOLN: 10.0000 mg | Freq: Four times a day (QID) | INTRAMUSCULAR | Status: DC | PRN

## 2024-07-10 MED ORDER — ACETAMINOPHEN 650 MG RE SUPP: 650.0000 mg | Freq: Four times a day (QID) | RECTAL | Status: DC | PRN

## 2024-07-10 MED ORDER — PANTOPRAZOLE SODIUM 40 MG IV SOLR
40.0000 mg | Freq: Two times a day (BID) | INTRAVENOUS | Status: DC
  Administered 2024-07-10: 40 mg via INTRAVENOUS
  Filled 2024-07-10: qty 10

## 2024-07-10 MED ORDER — LORAZEPAM 0.5 MG PO TABS
0.5000 mg | ORAL_TABLET | Freq: Every day | ORAL | Status: DC
  Administered 2024-07-10: 0.5 mg via ORAL
  Filled 2024-07-10: qty 1

## 2024-07-10 MED ORDER — ACETAMINOPHEN 325 MG PO TABS: 650.0000 mg | ORAL_TABLET | Freq: Four times a day (QID) | ORAL | Status: DC | PRN

## 2024-07-10 MED ORDER — DULOXETINE HCL 40 MG PO CPEP: 40.0000 mg | ORAL_CAPSULE | Freq: Every day | ORAL | Status: DC

## 2024-07-10 MED ORDER — AMLODIPINE BESYLATE 10 MG PO TABS
5.0000 mg | ORAL_TABLET | Freq: Every day | ORAL | Status: DC
  Administered 2024-07-10 – 2024-07-11 (×2): 5 mg via ORAL
  Filled 2024-07-10 (×2): qty 1

## 2024-07-10 NOTE — ED Provider Notes (Signed)
 " [place] EMERGENCY DEPARTMENT AT [facility] Provider Note   CSN: [id number] Arrival date & time: 07/10/24  9395     Patient presents with: Hematemesis  HPI Michaela Santana is a 79 y.o. female with lung cancer, history of gastric ulcers, PE not currently on blood thinners, DMII, HTN presenting for coffee-ground emesis. She states she had 1 occurrence this morning. Accompanied by her daughter who reports there was a about a cup of coffee ground emesis in the trash can.  States she has not been on a blood thinner for over a year.  Receives lanreotide for her cancer once a month.  Is followed here at [facility].  She denies chest pain or shortness of breath and abdominal pain. Normal BM yesterday.    HPI     Prior to Admission medications  Medication Sig Start Date End Date Taking? Authorizing Provider  acetaminophen  (TYLENOL ) 650 MG CR tablet Take 650 mg by mouth every 8 (eight) hours as needed for pain.    [provider]  albuterol  (VENTOLIN  HFA) 108 (90 Base) MCG/ACT inhaler Inhale into the lungs every 6 (six) hours as needed for wheezing or shortness of breath.    [provider]  amLODipine  (NORVASC ) 5 MG tablet Take 5 mg by mouth daily. 01/20/20   [provider]  apixaban  (ELIQUIS ) 5 MG TABS tablet Take 1 tablet (5 mg total) by mouth 2 (two) times daily. 03/07/24   Rai, Nydia POUR, MD  Artificial Tear Solution (SYSTANE CONTACTS OP) Apply 0.4 drops to eye as needed.    [provider]  Blood Glucose Monitoring Suppl (FREESTYLE LITE) DEVI USE UTD Patient taking differently: as needed. 07/26/16   [provider]  calcium  carbonate (TUMS - DOSED IN MG ELEMENTAL CALCIUM ) 500 MG chewable tablet Chew 1 tablet by mouth daily.    [provider]  DULoxetine  HCl 40 MG CPEP Take 1 capsule by mouth daily. 12/10/22   [provider]  ferrous sulfate 325 (65 FE) MG EC tablet Take 325 mg by mouth daily. Patient taking  differently: Take 325 mg by mouth 2 (two) times a week.    [provider]  FREESTYLE LITE test strip USE TO CHECK FASTING GLUCOSE IN THE MORNING AND 2 HOURS AFTER DINNER OR LUNCH 07/26/16   [provider]  furosemide  (LASIX ) 20 MG tablet Take 1 tablet (20 mg total) by mouth as needed for fluid or edema (swelling). lls 05/18/24   O'Neal, Darryle Ned, MD  gabapentin  (NEURONTIN ) 300 MG capsule Take 300 mg by mouth every evening. 01/01/20   [provider]  glipiZIDE  (GLUCOTROL  XL) 5 MG 24 hr tablet Take 5 mg by mouth daily with breakfast. 08/13/16   [provider]  Lancets (FREESTYLE) lancets  07/26/16   [provider]  latanoprost  (XALATAN ) 0.005 % ophthalmic solution Place 1 drop into the right eye at bedtime. 09/21/22   [provider]  loperamide  (IMODIUM ) 2 MG capsule Take 1 capsule (2 mg total) by mouth every 8 (eight) hours as needed for diarrhea or loose stools. 03/07/24   Rai, Nydia POUR, MD  loratadine (CLARITIN) 10 MG tablet Take 10 mg by mouth daily as needed for allergies.    [provider]  LORazepam  (ATIVAN ) 0.5 MG tablet Take 0.5 mg by mouth every 8 (eight) hours. Patient taking differently: Take 0.5 mg by mouth at bedtime.    [provider]  losartan  (COZAAR ) 100 MG tablet Take 0.5 tablets (50  mg total) by mouth daily. 10/21/22   Drusilla Sabas RAMAN, MD  Magnesium  Citrate (MAGNESIUM  GUMMIES PO) Take 1,200 mg by mouth 2 (two) times daily.    [provider]  ondansetron  (ZOFRAN ) 8 MG tablet Take 1 tablet (8 mg total) by mouth every 8 (eight) hours as needed. 03/07/24   Rai, Nydia POUR, MD  oxymetazoline  (AFRIN) 0.05 % nasal spray Place 1 spray into both nostrils 2 (two) times daily as needed for congestion. 03/07/24   Rai, Nydia POUR, MD  pantoprazole  (PROTONIX ) 40 MG tablet Take 1 tablet (40 mg total) by mouth 2 (two) times daily before a meal. Patient taking differently: Take 40 mg by mouth daily. 03/07/24 11/02/24   Rai, Ripudeep POUR, MD  potassium chloride  (KLOR-CON ) 20 MEQ packet Take 20 mEq by mouth daily. 02/20/24   [provider]  QUEtiapine  (SEROQUEL ) 25 MG tablet Take 12.5 mg by mouth every other day.    [provider]  vitamin B-12 (CYANOCOBALAMIN ) 1000 MCG tablet Take 1 tablet (1,000 mcg total) by mouth daily. 10/29/21   Caleen Burgess BROCKS, MD    Allergies: Codeine and Alprazolam     Review of Systems See HPI  Updated Vital Signs BP (!) 147/62 (BP Location: Right Arm)   Pulse (!) 55   Temp 98.7 F (37.1 C) (Oral)   Resp 17   SpO2 96%   Physical Exam Vitals and nursing note reviewed.  HENT:     Head: Normocephalic and atraumatic.     Mouth/Throat:     Mouth: Mucous membranes are moist.  Eyes:     General:        Right eye: No discharge.        Left eye: No discharge.     Conjunctiva/sclera: Conjunctivae normal.  Cardiovascular:     Rate and Rhythm: Normal rate and regular rhythm.     Pulses: Normal pulses.     Heart sounds: Normal heart sounds.  Pulmonary:     Effort: Pulmonary effort is normal.     Breath sounds: Normal breath sounds.  Abdominal:     General: Abdomen is flat.     Palpations: Abdomen is soft.  Skin:    General: Skin is warm and dry.  Neurological:     General: No focal deficit present.  Psychiatric:        Mood and Affect: Mood normal.     (all labs ordered are listed, but only abnormal results are displayed) Labs Reviewed  CBC WITH DIFFERENTIAL/PLATELET - Abnormal; Notable for the following components:      Result Value   WBC 10.8 (*)    RBC 2.77 (*)    Hemoglobin 8.8 (*)    HCT 28.9 (*)    MCV 104.3 (*)    Neutro Abs 8.0 (*)    All other components within normal limits  COMPREHENSIVE METABOLIC PANEL WITH GFR - Abnormal; Notable for the following components:   CO2 20 (*)    Glucose, Bld 244 (*)    BUN 44 (*)    Creatinine, Ser 1.44 (*)    Calcium  8.8 (*)    Total Protein 5.7 (*)    Albumin 3.4 (*)    GFR, Estimated 37 (*)     All other components within normal limits  LIPASE, BLOOD  PROTIME-INR  TYPE AND SCREEN  PREPARE RBC (CROSSMATCH)    EKG: None  Radiology: No results found.   .Critical Care  Performed by: Cadence Minton K, PA-C Authorized by: Lang Rush  K, PA-C   Critical care provider statement:    Critical care time (minutes):  30   Critical care was necessary to treat or prevent imminent or life-threatening deterioration of the following conditions:  Renal failure (Likely upper GI bleed with associated anemia and AKI.  Transfuse 1 unit of blood.)   Critical care was time spent personally by me on the following activities:  Development of treatment plan with patient or surrogate, discussions with consultants, evaluation of patient's response to treatment, examination of patient, ordering and review of laboratory studies, ordering and review of radiographic studies, ordering and performing treatments and interventions, pulse oximetry, re-evaluation of patient's condition and review of old charts    Medications Ordered in the ED  pantoprazole  (PROTONIX ) injection 40 mg (has no administration in time range)  0.9 %  sodium chloride  infusion (Manually program via Guardrails IV Fluids) (has no administration in time range)  pantoprazole  (PROTONIX ) injection 40 mg (has no administration in time range)                                    Medical Decision Making Amount and/or Complexity of Data Reviewed Labs: ordered.  Risk Prescription drug management. Decision regarding hospitalization.   Initial Impression and Ddx 79 year old well-appearing female presenting for hematemesis.  Exam unremarkable.  Her daughter did show me a picture from the emesis at home and it looks like a large amount of coffee-ground emesis.  DDx includes bleeding or perforated ulcer, upper versus lower GI bleed, esophageal varices, symptomatic anemia, AKI, other. Patient PMH that increases complexity of ED encounter:  lung cancer, history of gastric ulcers, PE not currently on blood thinners, DMII, HTN   Interpretation of Diagnostics - I independent reviewed and interpreted the labs as followed: hgb 8.8 (11.6, 9 days ago), Cr 1.44 (elevated from base), BUN 44   Patient Reassessment and Ultimate Disposition/Management Workup in HPI concerning for upper GI bleed without evidence of associated anemia and AKI.  Gave 40 mg of IV Protonix  and ordered 1 unit of PRBCs.  Discussed patient with Dr. Saintclair of [facility] who advised hospital admission and stated that her team would be following.  Admitted to hospital service with Dr. Zella.  Patient management required discussion with the following services or consulting groups:  Hospitalist Service and Gastroenterology  Complexity of Problems Addressed Acute complicated illness or Injury  Additional Data Reviewed and Analyzed Further history obtained from: Further history from spouse/family member, Past medical history and medications listed in the EMR, and Prior ED visit notes  Patient Encounter Risk Assessment Consideration of hospitalization      Final diagnoses:  Hematemesis, unspecified whether nausea present    ED Discharge Orders     None          Lang Norleen POUR, PA-C 07/10/24 0758  "

## 2024-07-10 NOTE — H&P (Signed)
 " History and Physical  Michaela Santana FMW:[id number] DOB: November 25, 1945 DOA: 07/10/2024  PCP: Chrystal Lamarr RAMAN, MD   Chief Complaint: Coffee-ground emesis  HPI: Michaela Santana is a 79 y.o. 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  History is provided by the patient as well as her 2 daughters were at the bedside.  They state that she has not been on any blood thinners for nearly a year, took a short course of Eliquis  per her oncologist Dr. Gatha due to risk of bleeding given her history of gastric ulcers.  She was in her usual state of health until early this morning about 5 AM when she had sudden onset of about 2 cups of coffee-ground emesis.  No bright red blood, she had some mild abdominal discomfort prior to this, no fevers, no diarrhea, no rectal bleeding.  She has been hemodynamically stable, denies dizziness or weakness.  Review of Systems: Please see HPI for pertinent positives and negatives. A complete 10 system review of systems are otherwise negative.  Past Medical History:  Diagnosis Date   Allergic rhinitis    Anemia    Arthritis    probably in hands (07/17/2016)   CKD (chronic kidney disease), stage III (HCC)    Depression    Esophageal reflux    Family history of adverse reaction to anesthesia    her father had some issues when he had his gallbladder taken out when he was in his late 12s   GERD (gastroesophageal reflux disease)    Glaucoma    open angle, bilateral   High cholesterol    History of kidney stones    Hypercholesterolemia    Hypertension    Kidney cysts    Lumbago with sciatica, right side    Pulmonary embolism (HCC)    Sepsis (HCC) 07/2016   d/t flu   Type II diabetes mellitus (HCC)    Vitamin D deficiency    Past Surgical History:   Procedure Laterality Date   BREAST BIOPSY  1990   ? side; it wasn't anything   BRONCHIAL BIOPSY  08/01/2021   Procedure: BRONCHIAL BIOPSIES;  Surgeon: Brenna Adine CROME, DO;  Location: [facility];  Service: Pulmonary;;   BRONCHIAL NEEDLE ASPIRATION BIOPSY  08/01/2021   Procedure: BRONCHIAL NEEDLE ASPIRATION BIOPSIES;  Surgeon: Brenna Adine CROME, DO;  Location: [facility];  Service: Pulmonary;;   CATARACT EXTRACTION, BILATERAL  2021   CHOLECYSTECTOMY OPEN  1971   COLONOSCOPY     2001,2011,2023   ESOPHAGOGASTRODUODENOSCOPY (EGD) WITH PROPOFOL  N/A 11/10/2022   Procedure: ESOPHAGOGASTRODUODENOSCOPY (EGD) WITH PROPOFOL ;  Surgeon: Burnette Fallow, MD;  Location: [facility];  Service: Gastroenterology;  Laterality: N/A;   TUBAL LIGATION  1978   VIDEO BRONCHOSCOPY WITH RADIAL ENDOBRONCHIAL ULTRASOUND  08/01/2021   Procedure: RADIAL ENDOBRONCHIAL ULTRASOUND;  Surgeon: Brenna Adine CROME, DO;  Location: [facility] ENDOSCOPY;  Service: Pulmonary;;   Social History:  reports that she has never smoked. She has never used smokeless tobacco. She reports that she does not drink alcohol and does not use drugs.  Allergies  Allergen Reactions   Codeine Hypertension    Other Reaction(s): makes her hyper   Alprazolam  Other (See Comments)    lethargic  Other Reaction(s): lethargic/drunk acting    Family History  Problem Relation Age of Onset   Dementia Mother  Diabetes Mother    Stroke Father    Hypertension Father    CVA Father    Diabetes Sister    Hypertension Sister    Heart disease Paternal Grandfather    Breast cancer Neg Hx      Prior to Admission medications  Medication Sig Start Date End Date Taking? Authorizing Provider  acetaminophen  (TYLENOL ) 650 MG CR tablet Take 650 mg by mouth every 8 (eight) hours as needed for pain.    [provider]  albuterol  (VENTOLIN  HFA) 108 (90 Base) MCG/ACT inhaler Inhale into the lungs every 6 (six) hours as needed for wheezing or shortness of  breath.    [provider]  amLODipine  (NORVASC ) 5 MG tablet Take 5 mg by mouth daily. 01/20/20   [provider]  apixaban  (ELIQUIS ) 5 MG TABS tablet Take 1 tablet (5 mg total) by mouth 2 (two) times daily. 03/07/24   Rai, Nydia POUR, MD  Artificial Tear Solution (SYSTANE CONTACTS OP) Apply 0.4 drops to eye as needed.    [provider]  Blood Glucose Monitoring Suppl (FREESTYLE LITE) DEVI USE UTD Patient taking differently: as needed. 07/26/16   [provider]  calcium  carbonate (TUMS - DOSED IN MG ELEMENTAL CALCIUM ) 500 MG chewable tablet Chew 1 tablet by mouth daily.    [provider]  DULoxetine  HCl 40 MG CPEP Take 1 capsule by mouth daily. 12/10/22   [provider]  ferrous sulfate 325 (65 FE) MG EC tablet Take 325 mg by mouth daily. Patient taking differently: Take 325 mg by mouth 2 (two) times a week.    [provider]  FREESTYLE LITE test strip USE TO CHECK FASTING GLUCOSE IN THE MORNING AND 2 HOURS AFTER DINNER OR LUNCH 07/26/16   [provider]  furosemide  (LASIX ) 20 MG tablet Take 1 tablet (20 mg total) by mouth as needed for fluid or edema (swelling). lls 05/18/24   O'Neal, Darryle Ned, MD  gabapentin  (NEURONTIN ) 300 MG capsule Take 300 mg by mouth every evening. 01/01/20   [provider]  glipiZIDE  (GLUCOTROL  XL) 5 MG 24 hr tablet Take 5 mg by mouth daily with breakfast. 08/13/16   [provider]  Lancets (FREESTYLE) lancets  07/26/16   [provider]  latanoprost  (XALATAN ) 0.005 % ophthalmic solution Place 1 drop into the right eye at bedtime. 09/21/22   [provider]  loperamide  (IMODIUM ) 2 MG capsule Take 1 capsule (2 mg total) by mouth every 8 (eight) hours as needed for diarrhea or loose stools. 03/07/24   Rai, Nydia POUR, MD  loratadine (CLARITIN) 10 MG tablet Take 10 mg by mouth daily as needed for allergies.    [provider]  LORazepam  (ATIVAN ) 0.5 MG tablet  Take 0.5 mg by mouth every 8 (eight) hours. Patient taking differently: Take 0.5 mg by mouth at bedtime.    [provider]  losartan  (COZAAR ) 100 MG tablet Take 0.5 tablets (50 mg total) by mouth daily. 10/21/22   Drusilla Sabas RAMAN, MD  Magnesium  Citrate (MAGNESIUM  GUMMIES PO) Take 1,200 mg by mouth 2 (two) times daily.    [provider]  ondansetron  (ZOFRAN ) 8 MG tablet Take 1 tablet (8 mg total) by mouth every 8 (eight) hours as needed. 03/07/24   Rai, Nydia POUR, MD  oxymetazoline  (AFRIN) 0.05 % nasal spray Place 1 spray into both nostrils 2 (two) times daily as needed for congestion. 03/07/24   Rai, Nydia POUR, MD  pantoprazole  (PROTONIX ) 40 MG  tablet Take 1 tablet (40 mg total) by mouth 2 (two) times daily before a meal. Patient taking differently: Take 40 mg by mouth daily. 03/07/24 11/02/24  Rai, Ripudeep MARLA, MD  potassium chloride  (KLOR-CON ) 20 MEQ packet Take 20 mEq by mouth daily. 02/20/24   [provider]  QUEtiapine  (SEROQUEL ) 25 MG tablet Take 12.5 mg by mouth every other day.    [provider]  vitamin B-12 (CYANOCOBALAMIN ) 1000 MCG tablet Take 1 tablet (1,000 mcg total) by mouth daily. 10/29/21   Caleen Burgess BROCKS, MD    Physical Exam: BP (!) 147/62 (BP Location: Right Arm)   Pulse (!) 55   Temp 98.7 F (37.1 C) (Oral)   Resp 17   SpO2 96%  General:  Alert, oriented, calm, in no acute distress  Eyes: EOMI, clear conjuctivae, white sclerea Neck: supple, no masses, trachea mildline  Cardiovascular: RRR, no murmurs or rubs, no peripheral edema  Respiratory: clear to auscultation bilaterally, no wheezes, no crackles  Abdomen: soft, nontender, nondistended, normal bowel tones heard  Skin: dry, no rashes  Musculoskeletal: no joint effusions, normal range of motion  Psychiatric: appropriate affect, normal speech  Neurologic: extraocular muscles intact, clear speech, moving all extremities with intact sensorium         Labs on Admission:  Basic  Metabolic Panel: Recent Labs  Lab 07/07/24 1048 07/09/24 1120 07/09/24 1121 07/10/24 0631  NA 137  --  137 136  K 5.6*  --  4.8 5.0  CL 106  --  104 107  CO2 18*  --  18* 20*  GLUCOSE 207*  --  199* 244*  BUN 27*  --  23 44*  CREATININE 1.26*  --  1.15* 1.44*  CALCIUM  9.3  --  9.3 8.8*  MG 1.4* 1.5*  --   --    Liver Function Tests: Recent Labs  Lab 07/07/24 1048 07/10/24 0631  AST 22 22  ALT 22 21  ALKPHOS 67 61  BILITOT 0.4 0.2  PROT 6.5 5.7*  ALBUMIN 4.1 3.4*   Recent Labs  Lab 07/10/24 0631  LIPASE 48   No results for input(s): AMMONIA in the last 168 hours. CBC: Recent Labs  Lab 07/10/24 0631  WBC 10.8*  NEUTROABS 8.0*  HGB 8.8*  HCT 28.9*  MCV 104.3*  PLT 296   Cardiac Enzymes: No results for input(s): CKTOTAL, CKMB, CKMBINDEX, TROPONINI in the last 168 hours. BNP (last 3 results) No results for input(s): BNP in the last 8760 hours.  ProBNP (last 3 results) No results for input(s): PROBNP in the last 8760 hours.  CBG: No results for input(s): GLUCAP in the last 168 hours.  Radiological Exams on Admission: No results found. Assessment/Plan Michaela Santana is a 79 y.o. 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   Upper GI bleed-suspected due to coffee-ground emesis, and blood loss anemia.  Patient currently stable with no further emesis or evidence of blood loss.  Not on any blood thinners at home. -Observation admission -Monitor closely on progressive unit -Keep n.p.o. except for sips with meds -Avoid blood thinners -IV Protonix  -Trend hemoglobin -GI consult  Acute on chronic anemia-due to suspected upper GI bleed.  Hemoglobin has fallen from baseline of about 11 to 8.8. -ER provider has ordered 1 unit PRBC  transfusion -Trend hemoglobin every  8 hours  CKD stage IIIa-renal function appears to be in her baseline range  Hypertension-Norvasc , ARB  Psych-Cymbalta , Seroquel    Stage IV neuroendocrine carcinoma-under the care of Dr. Gatha  DVT prophylaxis: SCDs only    Code Status: Full Code  Consults called: [facility]  Admission status: Observation  Time spent: 48 minutes  Dalexa Gentz CHRISTELLA Gail MD [facility] Pager [phone]  If 7PM-7AM, please contact night-coverage [email or web address] Password [email or web address]  07/10/2024, 7:57 AM   "

## 2024-07-10 NOTE — ED Notes (Signed)
 PT could not withstand standing up for orthostatic vitals; she said she was too tired and as NT stood up with PT & Pt daughter by bedside, PT pushed herself back down repeatedly; parademic made aware.

## 2024-07-10 NOTE — Progress Notes (Signed)
 [facility] ED [facility] GUIDE and Outpatient Palliative Care Note  This is a current GUIDE and Outpatient palliative care patient with [facility].  [facility] will follow for discharge disposition.  Please call with any questions or concerns.  Thank you Inocente Jacobs RN BSN [facility] Liaison [phone]

## 2024-07-10 NOTE — ED Triage Notes (Signed)
 PT had episodes of bloody emesis starting today. Hx of ulcers and CA. Pt alert with baseline dementia.

## 2024-07-10 NOTE — Progress Notes (Signed)
" ° ° °  PROCEDURAL EXPEDITER PROGRESS NOTE  Patient Name: Michaela Santana  DOB:11-Nov-1945 Date of Admission: 07/10/2024  Date of Assessment:07/10/24   -------------------------------------------------------------------------------------------------------------------   Brief clinical summary: pt is a 79 yr old female scheduled for an EGD on 07/11/24  Orders in place:  Yes   Communication with surgical team if no orders: n/a  Labs, test, and orders reviewed: yes  Requires surgical clearance:  No  What type of clearance: n/a  Clearance received: n/a  Barriers noted:n/a   Intervention provided by [facility] team: n/a  Barrier resolved:  not applicable   -------------------------------------------------------------------------------------------------------------------  [facility] Patient Care Command Expediter, Michaela Santana Please contact us  directly via secure chat (search for [facility]) or by calling us  at [phone] [facility] [place]). "

## 2024-07-11 ENCOUNTER — Encounter (HOSPITAL_COMMUNITY): Payer: Self-pay | Admitting: Internal Medicine

## 2024-07-11 ENCOUNTER — Observation Stay (HOSPITAL_COMMUNITY): Admitting: Certified Registered Nurse Anesthetist

## 2024-07-11 ENCOUNTER — Other Ambulatory Visit: Payer: Self-pay

## 2024-07-11 ENCOUNTER — Encounter (HOSPITAL_COMMUNITY): Admission: EM | Disposition: A | Payer: Self-pay | Source: Home / Self Care | Attending: Emergency Medicine

## 2024-07-11 DIAGNOSIS — E119 Type 2 diabetes mellitus without complications: Secondary | ICD-10-CM | POA: Diagnosis not present

## 2024-07-11 DIAGNOSIS — K449 Diaphragmatic hernia without obstruction or gangrene: Secondary | ICD-10-CM | POA: Diagnosis not present

## 2024-07-11 DIAGNOSIS — N183 Chronic kidney disease, stage 3 unspecified: Secondary | ICD-10-CM | POA: Insufficient documentation

## 2024-07-11 DIAGNOSIS — I509 Heart failure, unspecified: Secondary | ICD-10-CM

## 2024-07-11 DIAGNOSIS — I110 Hypertensive heart disease with heart failure: Secondary | ICD-10-CM | POA: Diagnosis not present

## 2024-07-11 LAB — CBC
HCT: 29.6 % — ABNORMAL LOW (ref 36.0–46.0)
Hemoglobin: 10 g/dL — ABNORMAL LOW (ref 12.0–15.0)
MCH: 32.9 pg (ref 26.0–34.0)
MCHC: 33.8 g/dL (ref 30.0–36.0)
MCV: 97.4 fL (ref 80.0–100.0)
Platelets: 268 10*3/uL (ref 150–400)
RBC: 3.04 MIL/uL — ABNORMAL LOW (ref 3.87–5.11)
RDW: 14.3 % (ref 11.5–15.5)
WBC: 8 10*3/uL (ref 4.0–10.5)
nRBC: 0 % (ref 0.0–0.2)

## 2024-07-11 LAB — BASIC METABOLIC PANEL WITH GFR
Anion gap: 10 (ref 5–15)
BUN: 34 mg/dL — ABNORMAL HIGH (ref 8–23)
CO2: 20 mmol/L — ABNORMAL LOW (ref 22–32)
Calcium: 8.8 mg/dL — ABNORMAL LOW (ref 8.9–10.3)
Chloride: 109 mmol/L (ref 98–111)
Creatinine, Ser: 1.2 mg/dL — ABNORMAL HIGH (ref 0.44–1.00)
GFR, Estimated: 46 mL/min — ABNORMAL LOW
Glucose, Bld: 114 mg/dL — ABNORMAL HIGH (ref 70–99)
Potassium: 4.1 mmol/L (ref 3.5–5.1)
Sodium: 138 mmol/L (ref 135–145)

## 2024-07-11 LAB — GLUCOSE, CAPILLARY
Glucose-Capillary: 126 mg/dL — ABNORMAL HIGH (ref 70–99)
Glucose-Capillary: 135 mg/dL — ABNORMAL HIGH (ref 70–99)
Glucose-Capillary: 236 mg/dL — ABNORMAL HIGH (ref 70–99)

## 2024-07-11 LAB — HEMOGLOBIN AND HEMATOCRIT, BLOOD
HCT: 32.5 % — ABNORMAL LOW (ref 36.0–46.0)
Hemoglobin: 9.8 g/dL — ABNORMAL LOW (ref 12.0–15.0)

## 2024-07-11 MED ORDER — PROPOFOL 500 MG/50ML IV EMUL
INTRAVENOUS | Status: DC | PRN
  Administered 2024-07-11 (×2): 20 mg via INTRAVENOUS
  Administered 2024-07-11: 30 mg via INTRAVENOUS
  Administered 2024-07-11: 50 mg via INTRAVENOUS

## 2024-07-11 MED ORDER — LACTATED RINGERS IV SOLN: INTRAVENOUS | Status: DC | PRN

## 2024-07-11 MED ORDER — PROPOFOL 10 MG/ML IV BOLUS
INTRAVENOUS | Status: AC
  Filled 2024-07-11: qty 20

## 2024-07-11 MED ORDER — SODIUM CHLORIDE 0.9 % IV SOLN: INTRAVENOUS | Status: DC

## 2024-07-11 MED ORDER — PANTOPRAZOLE SODIUM 40 MG PO TBEC
40.0000 mg | DELAYED_RELEASE_TABLET | Freq: Two times a day (BID) | ORAL | Status: DC
  Administered 2024-07-11: 40 mg via ORAL
  Filled 2024-07-11 (×2): qty 1

## 2024-07-11 NOTE — Plan of Care (Signed)
 " Problem: Education: Goal: Ability to describe self-care measures that may prevent or decrease complications (Diabetes Survival Skills Education) will improve 07/11/2024 1321 by Rosanne Elspeth HERO, RN Outcome: Adequate for Discharge 07/11/2024 1033 by Rosanne Elspeth HERO, RN Outcome: Progressing Goal: Individualized Educational Video(s) 07/11/2024 1321 by Rosanne Elspeth HERO, RN Outcome: Adequate for Discharge 07/11/2024 1033 by Rosanne Elspeth HERO, RN Outcome: Progressing   Problem: Coping: Goal: Ability to adjust to condition or change in health will improve 07/11/2024 1321 by Rosanne Elspeth HERO, RN Outcome: Adequate for Discharge 07/11/2024 1033 by Rosanne Elspeth HERO, RN Outcome: Progressing   Problem: Fluid Volume: Goal: Ability to maintain a balanced intake and output will improve 07/11/2024 1321 by Rosanne Elspeth HERO, RN Outcome: Adequate for Discharge 07/11/2024 1033 by Rosanne Elspeth HERO, RN Outcome: Progressing   Problem: Health Behavior/Discharge Planning: Goal: Ability to identify and utilize available resources and services will improve 07/11/2024 1321 by Rosanne Elspeth HERO, RN Outcome: Adequate for Discharge 07/11/2024 1033 by Rosanne Elspeth HERO, RN Outcome: Progressing Goal: Ability to manage health-related needs will improve 07/11/2024 1321 by Rosanne Elspeth HERO, RN Outcome: Adequate for Discharge 07/11/2024 1033 by Rosanne Elspeth HERO, RN Outcome: Progressing   Problem: Metabolic: Goal: Ability to maintain appropriate glucose levels will improve 07/11/2024 1321 by Rosanne Elspeth HERO, RN Outcome: Adequate for Discharge 07/11/2024 1033 by Rosanne Elspeth HERO, RN Outcome: Progressing   Problem: Nutritional: Goal: Maintenance of adequate nutrition will improve 07/11/2024 1321 by Rosanne Elspeth HERO, RN Outcome: Adequate for Discharge 07/11/2024 1033 by Rosanne Elspeth HERO, RN Outcome: Progressing Goal: Progress toward achieving an optimal  weight will improve 07/11/2024 1321 by Rosanne Elspeth HERO, RN Outcome: Adequate for Discharge 07/11/2024 1033 by Rosanne Elspeth HERO, RN Outcome: Progressing   Problem: Skin Integrity: Goal: Risk for impaired skin integrity will decrease 07/11/2024 1321 by Rosanne Elspeth HERO, RN Outcome: Adequate for Discharge 07/11/2024 1033 by Rosanne Elspeth HERO, RN Outcome: Progressing   Problem: Tissue Perfusion: Goal: Adequacy of tissue perfusion will improve 07/11/2024 1321 by Rosanne Elspeth HERO, RN Outcome: Adequate for Discharge 07/11/2024 1033 by Rosanne Elspeth HERO, RN Outcome: Progressing   Problem: Education: Goal: Knowledge of General Education information will improve Description: Including pain rating scale, medication(s)/side effects and non-pharmacologic comfort measures 07/11/2024 1321 by Rosanne Elspeth HERO, RN Outcome: Adequate for Discharge 07/11/2024 1033 by Rosanne Elspeth HERO, RN Outcome: Progressing   Problem: Health Behavior/Discharge Planning: Goal: Ability to manage health-related needs will improve 07/11/2024 1321 by Rosanne Elspeth HERO, RN Outcome: Adequate for Discharge 07/11/2024 1033 by Rosanne Elspeth HERO, RN Outcome: Progressing   Problem: Clinical Measurements: Goal: Ability to maintain clinical measurements within normal limits will improve 07/11/2024 1321 by Rosanne Elspeth HERO, RN Outcome: Adequate for Discharge 07/11/2024 1033 by Rosanne Elspeth HERO, RN Outcome: Progressing Goal: Will remain free from infection 07/11/2024 1321 by Rosanne Elspeth HERO, RN Outcome: Adequate for Discharge 07/11/2024 1033 by Rosanne Elspeth HERO, RN Outcome: Progressing Goal: Diagnostic test results will improve 07/11/2024 1321 by Rosanne Elspeth HERO, RN Outcome: Adequate for Discharge 07/11/2024 1033 by Rosanne Elspeth HERO, RN Outcome: Progressing Goal: Respiratory complications will improve 07/11/2024 1321 by Rosanne Elspeth HERO, RN Outcome: Adequate for  Discharge 07/11/2024 1033 by Rosanne Elspeth HERO, RN Outcome: Progressing Goal: Cardiovascular complication will be avoided 07/11/2024 1321 by Rosanne Elspeth HERO, RN Outcome: Adequate for Discharge 07/11/2024 1033 by Rosanne Elspeth HERO, RN Outcome: Progressing   Problem: Activity: Goal: Risk for activity intolerance will decrease 07/11/2024 1321 by Rosanne Elspeth HERO, RN Outcome: Adequate for  Discharge 07/11/2024 1033 by Rosanne Elspeth HERO, RN Outcome: Progressing   Problem: Nutrition: Goal: Adequate nutrition will be maintained 07/11/2024 1321 by Rosanne Elspeth HERO, RN Outcome: Adequate for Discharge 07/11/2024 1033 by Rosanne Elspeth HERO, RN Outcome: Progressing   Problem: Coping: Goal: Level of anxiety will decrease 07/11/2024 1321 by Rosanne Elspeth HERO, RN Outcome: Adequate for Discharge 07/11/2024 1033 by Rosanne Elspeth HERO, RN Outcome: Progressing   Problem: Elimination: Goal: Will not experience complications related to bowel motility 07/11/2024 1321 by Rosanne Elspeth HERO, RN Outcome: Adequate for Discharge 07/11/2024 1033 by Rosanne Elspeth HERO, RN Outcome: Progressing Goal: Will not experience complications related to urinary retention 07/11/2024 1321 by Rosanne Elspeth HERO, RN Outcome: Adequate for Discharge 07/11/2024 1033 by Rosanne Elspeth HERO, RN Outcome: Progressing   Problem: Pain Managment: Goal: General experience of comfort will improve and/or be controlled 07/11/2024 1321 by Rosanne Elspeth HERO, RN Outcome: Adequate for Discharge 07/11/2024 1033 by Rosanne Elspeth HERO, RN Outcome: Progressing   Problem: Safety: Goal: Ability to remain free from injury will improve 07/11/2024 1321 by Rosanne Elspeth HERO, RN Outcome: Adequate for Discharge 07/11/2024 1033 by Rosanne Elspeth HERO, RN Outcome: Progressing   Problem: Skin Integrity: Goal: Risk for impaired skin integrity will decrease 07/11/2024 1321 by Rosanne Elspeth HERO, RN Outcome:  Adequate for Discharge 07/11/2024 1033 by Rosanne Elspeth HERO, RN Outcome: Progressing   "

## 2024-07-11 NOTE — Transfer of Care (Signed)
 Immediate Anesthesia Transfer of Care Note  Patient: Michaela Santana  Procedure(s) Performed: Procedures: EGD (ESOPHAGOGASTRODUODENOSCOPY) (N/A)  Patient Location: PACU and Endoscopy Unit  Anesthesia Type:MAC  Level of Consciousness: awake, alert  and oriented  Airway & Oxygen Therapy: Patient Spontanous Breathing and Patient connected to nasal cannula oxygen  Post-op Assessment: Report given to RN and Post -op Vital signs reviewed and stable  Post vital signs: Reviewed and stable  Last Vitals:  Vitals:   07/11/24 0439 07/11/24 0737  BP: (!) 138/47 (!) 138/57  Pulse: (!) 53 (!) 45  Resp: 18 (!) 21  Temp: 36.7 C (!) 36.2 C  SpO2: 97% 96%    Complications: No apparent anesthesia complications

## 2024-07-11 NOTE — Plan of Care (Signed)

## 2024-07-11 NOTE — Anesthesia Preprocedure Evaluation (Addendum)
 "                                  Anesthesia Evaluation  Patient identified by MRN, date of birth, ID band Patient awake    Reviewed: Allergy & Precautions, NPO status , Patient's Chart, lab work & pertinent test results  History of Anesthesia Complications Negative for: history of anesthetic complications  Airway Mallampati: III  TM Distance: >3 FB Neck ROM: Full    Dental  (+) Teeth Intact, Dental Advisory Given   Pulmonary neg shortness of breath, neg sleep apnea, neg COPD, neg recent URI   breath sounds clear to auscultation       Cardiovascular hypertension, +CHF  + dysrhythmias  Rhythm:Regular   1. Left ventricular ejection fraction, by estimation, is 65 to 70%. The  left ventricle has normal function. The left ventricle has no regional  wall motion abnormalities. Left ventricular diastolic parameters are  consistent with Grade I diastolic  dysfunction (impaired relaxation).   2. Right ventricular systolic function is normal. The right ventricular  size is normal. There is normal pulmonary artery systolic pressure. The  estimated right ventricular systolic pressure is 30.9 mmHg.   3. The mitral valve is grossly normal. No evidence of mitral valve  regurgitation.   4. The aortic valve is tricuspid. Aortic valve regurgitation is not  visualized.   5. The inferior vena cava is normal in size with greater than 50%  respiratory variability, suggesting right atrial pressure of 3 mmHg.     Neuro/Psych neg Seizures PSYCHIATRIC DISORDERS  Depression     Neuromuscular disease    GI/Hepatic PUD,GERD  ,,? GI bleed  H/O duodenal ulcer   Endo/Other  diabetes    Renal/GU CRFRenal diseaseLab Results      Component                Value               Date                      NA                       138                 07/11/2024                K                        4.1                 07/11/2024                CO2                      20 (L)               07/11/2024                GLUCOSE                  114 (H)             07/11/2024                BUN  34 (H)              07/11/2024                CREATININE               1.20 (H)            07/11/2024                CALCIUM                   8.8 (L)             07/11/2024                GFRNONAA                 46 (L)              07/11/2024                Musculoskeletal  (+) Arthritis ,    Abdominal   Peds  Hematology  (+) Blood dyscrasia, anemia Lab Results      Component                Value               Date                      WBC                      8.0                 07/11/2024                HGB                      10.0 (L)            07/11/2024                HCT                      29.6 (L)            07/11/2024                MCV                      97.4                07/11/2024                PLT                      268                 07/11/2024              Anesthesia Other Findings   Reproductive/Obstetrics                              Anesthesia Physical Anesthesia Plan  ASA: 3  Anesthesia Plan: MAC   Post-op Pain Management: Minimal or no pain anticipated   Induction: Intravenous  PONV Risk Score and Plan: 2 and Propofol  infusion  Airway Management Planned: Nasal Cannula, Simple Face Mask and Natural Airway  Additional Equipment:  Intra-op Plan:   Post-operative Plan:   Informed Consent: I have reviewed the patients History and Physical, chart, labs and discussed the procedure including the risks, benefits and alternatives for the proposed anesthesia with the patient or authorized representative who has indicated his/her understanding and acceptance.     Dental advisory given  Plan Discussed with: CRNA  Anesthesia Plan Comments:          Anesthesia Quick Evaluation  "

## 2024-07-11 NOTE — Discharge Summary (Addendum)
 " Physician Discharge Summary   Patient: Michaela Santana MRN: [id number] DOB: 12/26/1945  Admit date:     07/10/2024  Discharge date: 07/11/24  Discharge Physician: Toribio Door   PCP: Chrystal Lamarr RAMAN, MD   Recommendations at discharge:  Follow-up GI bleed Follow-up anemia Ongoing care for diabetes and CKD  Discharge Diagnoses: Principal Problem:   Upper GI bleed Active Problems:   Type II diabetes mellitus (HCC)   Acute blood loss anemia   Chronic kidney disease (CKD), stage III (moderate) [facility])    Hospital Course: 79 year old woman PMH including stage IV low-grade neuroendocrine carcinoma, CKD, 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  Consultants GI  Procedures/Events 1/31 EGD Cameron's erosions, suspected source for hematemesis.  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  Twice daily PPI.  No NSAIDs.  Follow-up with GI as an outpatient.  Normocytic anemia ABLA Suspect chronic blood loss secondary to above.  Hemoglobin was 15 in December, 11 a little more than a week ago.  Received 1 unit PRBCs with appropriate rise in hemoglobin. Restart iron.  Follow-up anemia as an outpatient.  Diabetes mellitus type 2 CBG stable  CKD stage IIIa Baseline creatinine around 1.1.  Essential hypertension  Disposition: Home Diet recommendation:  Regular diet DISCHARGE MEDICATION: Allergies as of 07/11/2024       Reactions   Codeine Hypertension   Other Reaction(s): makes her hyper   Alprazolam  Other (See Comments)   lethargic Other Reaction(s): lethargic/drunk acting        Medication List     STOP taking these medications    apixaban  5 MG Tabs tablet Commonly known as: ELIQUIS    furosemide  20 MG tablet Commonly known as: LASIX    gabapentin  300 MG capsule Commonly known as: NEURONTIN    latanoprost  0.005 %  ophthalmic solution Commonly known as: XALATAN    losartan  100 MG tablet Commonly known as: COZAAR    potassium chloride  20 MEQ packet Commonly known as: KLOR-CON    sodium polystyrene powder Commonly known as: KAYEXALATE        TAKE these medications    acetaminophen  650 MG CR tablet Commonly known as: TYLENOL  Take 650 mg by mouth every 8 (eight) hours as needed for pain.   albuterol  108 (90 Base) MCG/ACT inhaler Commonly known as: VENTOLIN  HFA Inhale into the lungs every 6 (six) hours as needed for wheezing or shortness of breath.   aMILoride 5 MG tablet Commonly known as: MIDAMOR Take 5 mg by mouth daily.   amLODipine  5 MG tablet Commonly known as: NORVASC  Take 5 mg by mouth daily.   calcium  carbonate 500 MG chewable tablet Commonly known as: TUMS - dosed in mg elemental calcium  Chew 1 tablet by mouth daily.   cyanocobalamin  1000 MCG tablet Commonly known as: VITAMIN B12 Take 1 tablet (1,000 mcg total) by mouth daily.   DULoxetine  HCl 40 MG Cpep Take 40 mg by mouth daily.   famotidine  40 MG tablet Commonly known as: PEPCID  Take 40 mg by mouth 2 (two) times daily.   ferrous sulfate 325 (65 FE) MG EC tablet Take 325 mg by mouth daily.   freestyle lancets   FreeStyle Lite Devi USE UTD What changed: See the new instructions.   FREESTYLE LITE test strip Generic drug: glucose blood USE TO CHECK FASTING GLUCOSE IN THE MORNING AND 2 HOURS AFTER DINNER OR LUNCH   glipiZIDE  5 MG 24 hr tablet  Commonly known as: GLUCOTROL  XL Take 5 mg by mouth daily with breakfast.   loperamide  2 MG capsule Commonly known as: IMODIUM  Take 1 capsule (2 mg total) by mouth every 8 (eight) hours as needed for diarrhea or loose stools.   loratadine 10 MG tablet Commonly known as: CLARITIN Take 10 mg by mouth daily as needed for allergies.   LORazepam  0.5 MG tablet Commonly known as: ATIVAN  Take 0.5 mg by mouth every 8 (eight) hours. What changed: when to take this    MAGNESIUM  GUMMIES PO Take 1,200 mg by mouth 2 (two) times daily.   ondansetron  8 MG tablet Commonly known as: ZOFRAN  Take 1 tablet (8 mg total) by mouth every 8 (eight) hours as needed. What changed: reasons to take this   oxymetazoline  0.05 % nasal spray Commonly known as: AFRIN Place 1 spray into both nostrils 2 (two) times daily as needed for congestion.   pantoprazole  40 MG tablet Commonly known as: PROTONIX  Take 1 tablet (40 mg total) by mouth 2 (two) times daily before a meal.   QUEtiapine  25 MG tablet Commonly known as: SEROQUEL  Take 12.5 mg by mouth every other day.   SYSTANE CONTACTS OP Apply 0.4 drops to eye as needed (dry eyes).   valsartan 80 MG tablet Commonly known as: DIOVAN Take 80 mg by mouth daily.   Vitamin D (Ergocalciferol) 1.25 MG (50000 UNIT) Caps capsule Commonly known as: DRISDOL Take 50,000 Units by mouth once a week.        Follow-up Information     Chrystal Lamarr RAMAN, MD. Schedule an appointment as soon as possible for a visit in 1 week(s).   Specialty: Family Medicine Contact information: [address] [place] [address] [phone]         Gastroenterology, Margarete Follow up.   Why: Call office for an appointment Contact information: [address] [place] [address] [phone]               Feels better, no bleeding.  Wants to go home.  Discharge Exam: Filed Weights   07/10/24 1710  Weight: 80 kg   Physical Exam Vitals reviewed.  Constitutional:      General: She is not in acute distress.    Appearance: She is not ill-appearing or toxic-appearing.  Cardiovascular:     Rate and Rhythm: Normal rate and regular rhythm.     Heart sounds: No murmur heard. Pulmonary:     Effort: Pulmonary effort is normal. No respiratory distress.     Breath sounds: No wheezing, rhonchi or rales.  Neurological:     Mental Status: She is alert.  Psychiatric:        Mood and Affect: Mood normal.         Behavior: Behavior normal.      Condition at discharge: good  The results of significant diagnostics from this hospitalization (including imaging, microbiology, ancillary and laboratory) are listed below for reference.   Imaging Studies: No results found.  Microbiology: Results for orders placed or performed during the hospital encounter of 03/05/24  Gastrointestinal Panel by PCR , Stool     Status: Abnormal   Collection Time: 03/06/24  8:48 AM   Specimen: STOOL  Result Value Ref Range Status   Campylobacter species NOT DETECTED NOT DETECTED Final   Plesimonas shigelloides NOT DETECTED NOT DETECTED Final   Salmonella species NOT DETECTED NOT DETECTED Final   Yersinia enterocolitica NOT DETECTED NOT DETECTED Final   Vibrio species  NOT DETECTED NOT DETECTED Final   Vibrio cholerae NOT DETECTED NOT DETECTED Final   Enteroaggregative E coli (EAEC) NOT DETECTED NOT DETECTED Final   Enteropathogenic E coli (EPEC) NOT DETECTED NOT DETECTED Final   Enterotoxigenic E coli (ETEC) NOT DETECTED NOT DETECTED Final   Shiga like toxin producing E coli (STEC) NOT DETECTED NOT DETECTED Final   Shigella/Enteroinvasive E coli (EIEC) NOT DETECTED NOT DETECTED Final   Cryptosporidium NOT DETECTED NOT DETECTED Final   Cyclospora cayetanensis NOT DETECTED NOT DETECTED Final   Entamoeba histolytica NOT DETECTED NOT DETECTED Final   Giardia lamblia NOT DETECTED NOT DETECTED Final   Adenovirus F40/41 NOT DETECTED NOT DETECTED Final   Astrovirus NOT DETECTED NOT DETECTED Final   Norovirus GI/GII DETECTED (A) NOT DETECTED Final    Comment: RESULT CALLED TO, READ BACK BY AND VERIFIED WITH: LEIGH A., RN AT 1415 03/07/24 RAM    Rotavirus A NOT DETECTED NOT DETECTED Final   Sapovirus (I, II, IV, and V) NOT DETECTED NOT DETECTED Final    Comment: Performed at [facility], [address] [place], [place] [address]  C Difficile Quick Screen w PCR reflex     Status: None   Collection Time:  03/06/24  8:48 AM   Specimen: STOOL  Result Value Ref Range Status   C Diff antigen NEGATIVE NEGATIVE Final   C Diff toxin NEGATIVE NEGATIVE Final   C Diff interpretation No C. difficile detected.  Final    Comment: Performed at [facility], [address], [place], [place] [address]    Labs: CBC: Recent Labs  Lab 07/10/24 0631 07/10/24 1906 07/11/24 0356 07/11/24 1046  WBC 10.8*  --  8.0  --   NEUTROABS 8.0*  --   --   --   HGB 8.8* 9.9* 10.0* 9.8*  HCT 28.9* 31.9* 29.6* 32.5*  MCV 104.3*  --  97.4  --   PLT 296  --  268  --    Basic Metabolic Panel: Recent Labs  Lab 07/07/24 1048 07/09/24 1120 07/09/24 1121 07/10/24 0631 07/11/24 0356  NA 137  --  137 136 138  K 5.6*  --  4.8 5.0 4.1  CL 106  --  104 107 109  CO2 18*  --  18* 20* 20*  GLUCOSE 207*  --  199* 244* 114*  BUN 27*  --  23 44* 34*  CREATININE 1.26*  --  1.15* 1.44* 1.20*  CALCIUM  9.3  --  9.3 8.8* 8.8*  MG 1.4* 1.5*  --   --   --    Liver Function Tests: Recent Labs  Lab 07/07/24 1048 07/10/24 0631  AST 22 22  ALT 22 21  ALKPHOS 67 61  BILITOT 0.4 0.2  PROT 6.5 5.7*  ALBUMIN 4.1 3.4*   CBG: Recent Labs  Lab 07/10/24 1956 07/10/24 2351 07/11/24 0435 07/11/24 0749 07/11/24 1158  GLUCAP 224* 176* 126* 135* 236*    Discharge time spent: greater than 30 minutes.  Signed: Toribio Door, MD [facility] 07/11/2024 "

## 2024-07-11 NOTE — Op Note (Signed)
 [facility] Patient Name: Michaela Santana Procedure Date: 07/11/2024 MRN: [id number] Attending MD: Elsie Cree , MD, [id number] Date of Birth: 05/06/1946 CSN: [id number] Age: 79 Admit Type: Inpatient Procedure:                Upper GI endoscopy Indications:              Acute post hemorrhagic anemia, Iron deficiency                            anemia, Hematemesis Providers:                Elsie Cree, MD, Robie Breed, RN,                            [facility] [place], Technician, Dustin Harvey Referring MD:             [facility] Medicines:                Monitored Anesthesia Care Complications:            No immediate complications. Estimated Blood Loss:     Estimated blood loss: none. Procedure:                Pre-Anesthesia Assessment:                           - Prior to the procedure, a History and Physical                            was performed, and patient medications and                            allergies were reviewed. The patient's tolerance of                            previous anesthesia was also reviewed. The risks                            and benefits of the procedure and the sedation                            options and risks were discussed with the patient.                            All questions were answered, and informed consent                            was obtained. Prior Anticoagulants: The patient has                            taken no anticoagulant or antiplatelet agents. ASA                            Grade Assessment: III - A patient with severe  systemic disease. After reviewing the risks and                            benefits, the patient was deemed in satisfactory                            condition to undergo the procedure.                           After obtaining informed consent, the endoscope was                            passed under direct vision. Throughout the                             procedure, the patient's blood pressure, pulse, and                            oxygen saturations were monitored continuously. The                            [id number]-[id number] ([id number]) Olympus endoscope was introduced                            through the mouth, and advanced to the second part                            of duodenum. The upper GI endoscopy was                            accomplished without difficulty. The patient                            tolerated the procedure well. Scope In: Scope Out: Findings:      A 5 cm hiatal hernia was present.      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      The exam of the esophagus was otherwise normal.      The entire examined stomach was normal.      The duodenal bulb, first portion of the duodenum and second portion of       the duodenum were normal.      No old/fresh blood seen to extent of our examination. Impression:               - 5 cm hiatal hernia.                           - Cameron's erosions, suspected source of                            hematemesis.                           - Normal stomach.                           -  Normal duodenal bulb, first portion of the                            duodenum and second portion of the duodenum.                           - No old/fresh blood seen to extent of our                            examination. Moderate Sedation:      Not Applicable - Patient had care per Anesthesia. Recommendation:           - Return patient to hospital ward for ongoing care.                           - Soft diet today.                           - Continue present medications. Needs PPI bid.                            Avoid NSAIDs.                           - [facility] will sign off.                           - Return to GI clinic as outpatient. Procedure Code(s):        --- Professional ---                           [phone], Esophagogastroduodenoscopy, flexible,                             transoral; diagnostic, including collection of                            specimen(s) by brushing or washing, when performed                            (separate procedure) Diagnosis Code(s):        --- Professional ---                           K44.9, Diaphragmatic hernia without obstruction or                            gangrene                           D62, Acute posthemorrhagic anemia                           D50.9, Iron deficiency anemia, unspecified                           K92.0, Hematemesis CPT copyright 2022 [employer].  All rights reserved. The codes documented in this report are preliminary and upon coder review may  be revised to meet current compliance requirements. Elsie Cree, MD 07/11/2024 8:47:35 AM This report has been signed electronically. Number of Addenda: 0

## 2024-07-11 NOTE — Anesthesia Postprocedure Evaluation (Signed)
"   Anesthesia Post Note  Patient: Michaela Santana  Procedure(s) Performed: EGD (ESOPHAGOGASTRODUODENOSCOPY)     Patient location during evaluation: Endoscopy Anesthesia Type: MAC Level of consciousness: awake and patient cooperative Pain management: pain level controlled Vital Signs Assessment: post-procedure vital signs reviewed and stable Respiratory status: spontaneous breathing, nonlabored ventilation and respiratory function stable Cardiovascular status: stable and blood pressure returned to baseline Postop Assessment: no apparent nausea or vomiting Anesthetic complications: no   No notable events documented.  Last Vitals:  Vitals:   07/11/24 0838 07/11/24 0845  BP: 115/60 120/61  Pulse: (!) 49 (!) 50  Resp: (!) 23 20  Temp: 36.8 C   SpO2: 100% 98%    Last Pain:  Vitals:   07/11/24 0845  TempSrc:   PainSc: Asleep                 [name]      "

## 2024-07-11 NOTE — Interval H&P Note (Signed)
 History and Physical Interval Note:  07/11/2024 8:01 AM  Michaela Santana  has presented today for surgery, with the diagnosis of Hematemesis.  The various methods of treatment have been discussed with the patient and family. After consideration of risks, benefits and other options for treatment, the patient has consented to  Procedures: EGD (ESOPHAGOGASTRODUODENOSCOPY) (N/A) as a surgical intervention.  The patient's history has been reviewed, patient examined, no change in status, stable for surgery.  I have reviewed the patient's chart and labs.  Questions were answered to the patient's satisfaction.     [name]

## 2024-07-13 LAB — TYPE AND SCREEN
ABO/RH(D): A POS
Antibody Screen: NEGATIVE
Unit division: 0

## 2024-07-13 LAB — BPAM RBC
Blood Product Expiration Date: 202602272359
ISSUE DATE / TIME: 202601301011
Unit Type and Rh: 6200

## 2024-07-21 ENCOUNTER — Inpatient Hospital Stay: Admitting: Internal Medicine

## 2024-07-21 ENCOUNTER — Inpatient Hospital Stay

## 2024-07-21 ENCOUNTER — Inpatient Hospital Stay: Attending: Internal Medicine

## 2024-08-18 ENCOUNTER — Inpatient Hospital Stay

## 2024-08-18 ENCOUNTER — Inpatient Hospital Stay: Attending: Internal Medicine

## 2024-08-18 ENCOUNTER — Inpatient Hospital Stay: Admitting: Physician Assistant
